# Patient Record
Sex: Female | Born: 1950 | Race: White | Hispanic: No | Smoking: Never smoker
Health system: Southern US, Community
[De-identification: ages and names within clinical notes are randomized; demographics above are authoritative.]

## PROBLEM LIST (undated history)

## (undated) ENCOUNTER — Emergency Department (HOSPITAL_BASED_OUTPATIENT_CLINIC_OR_DEPARTMENT_OTHER): Admission: EM | Payer: Medicare Other | Source: Home / Self Care

## (undated) DIAGNOSIS — Z86010 Personal history of colonic polyps: Secondary | ICD-10-CM

## (undated) DIAGNOSIS — M810 Age-related osteoporosis without current pathological fracture: Secondary | ICD-10-CM

## (undated) DIAGNOSIS — E559 Vitamin D deficiency, unspecified: Secondary | ICD-10-CM

## (undated) DIAGNOSIS — F329 Major depressive disorder, single episode, unspecified: Secondary | ICD-10-CM

## (undated) DIAGNOSIS — E785 Hyperlipidemia, unspecified: Secondary | ICD-10-CM

## (undated) HISTORY — DX: Vitamin D deficiency, unspecified: E55.9

## (undated) HISTORY — DX: Age-related osteoporosis without current pathological fracture: M81.0

## (undated) HISTORY — PX: COLONOSCOPY: SHX174

## (undated) HISTORY — PX: OTHER SURGICAL HISTORY: SHX169

## (undated) HISTORY — DX: Major depressive disorder, single episode, unspecified: F32.9

## (undated) HISTORY — DX: Personal history of colonic polyps: Z86.010

## (undated) HISTORY — DX: Hyperlipidemia, unspecified: E78.5

---

## 2001-05-08 ENCOUNTER — Encounter: Payer: Self-pay | Admitting: Emergency Medicine

## 2001-05-08 ENCOUNTER — Emergency Department (HOSPITAL_COMMUNITY): Admission: EM | Admit: 2001-05-08 | Discharge: 2001-05-08 | Payer: Self-pay | Admitting: Emergency Medicine

## 2001-05-18 ENCOUNTER — Encounter: Admission: RE | Admit: 2001-05-18 | Discharge: 2001-05-18 | Payer: Self-pay | Admitting: Family Medicine

## 2001-05-18 ENCOUNTER — Encounter: Payer: Self-pay | Admitting: Family Medicine

## 2001-06-07 ENCOUNTER — Other Ambulatory Visit: Admission: RE | Admit: 2001-06-07 | Discharge: 2001-06-07 | Payer: Self-pay | Admitting: Obstetrics & Gynecology

## 2001-06-16 ENCOUNTER — Encounter: Payer: Self-pay | Admitting: Obstetrics & Gynecology

## 2001-06-16 ENCOUNTER — Encounter: Admission: RE | Admit: 2001-06-16 | Discharge: 2001-06-16 | Payer: Self-pay | Admitting: Obstetrics & Gynecology

## 2002-03-27 ENCOUNTER — Emergency Department (HOSPITAL_COMMUNITY): Admission: EM | Admit: 2002-03-27 | Discharge: 2002-03-27 | Payer: Self-pay | Admitting: Emergency Medicine

## 2002-06-09 ENCOUNTER — Other Ambulatory Visit: Admission: RE | Admit: 2002-06-09 | Discharge: 2002-06-09 | Payer: Self-pay | Admitting: Obstetrics & Gynecology

## 2002-06-14 ENCOUNTER — Encounter: Payer: Self-pay | Admitting: Obstetrics & Gynecology

## 2002-06-14 ENCOUNTER — Encounter: Admission: RE | Admit: 2002-06-14 | Discharge: 2002-06-14 | Payer: Self-pay | Admitting: Obstetrics & Gynecology

## 2002-08-09 ENCOUNTER — Emergency Department (HOSPITAL_COMMUNITY): Admission: EM | Admit: 2002-08-09 | Discharge: 2002-08-09 | Payer: Self-pay | Admitting: Emergency Medicine

## 2002-08-09 ENCOUNTER — Encounter: Payer: Self-pay | Admitting: Emergency Medicine

## 2003-06-12 ENCOUNTER — Other Ambulatory Visit: Admission: RE | Admit: 2003-06-12 | Discharge: 2003-06-12 | Payer: Self-pay | Admitting: Obstetrics & Gynecology

## 2003-07-12 ENCOUNTER — Ambulatory Visit (HOSPITAL_COMMUNITY): Admission: RE | Admit: 2003-07-12 | Discharge: 2003-07-12 | Payer: Self-pay | Admitting: Obstetrics & Gynecology

## 2003-07-12 ENCOUNTER — Encounter: Payer: Self-pay | Admitting: Obstetrics & Gynecology

## 2004-02-15 ENCOUNTER — Emergency Department (HOSPITAL_COMMUNITY): Admission: EM | Admit: 2004-02-15 | Discharge: 2004-02-15 | Payer: Self-pay | Admitting: Family Medicine

## 2004-07-14 ENCOUNTER — Ambulatory Visit (HOSPITAL_COMMUNITY): Admission: RE | Admit: 2004-07-14 | Discharge: 2004-07-14 | Payer: Self-pay | Admitting: Obstetrics & Gynecology

## 2005-07-17 ENCOUNTER — Ambulatory Visit (HOSPITAL_COMMUNITY): Admission: RE | Admit: 2005-07-17 | Discharge: 2005-07-17 | Payer: Self-pay | Admitting: Obstetrics & Gynecology

## 2006-03-16 ENCOUNTER — Ambulatory Visit: Payer: Self-pay | Admitting: Internal Medicine

## 2006-06-10 ENCOUNTER — Emergency Department (HOSPITAL_COMMUNITY): Admission: EM | Admit: 2006-06-10 | Discharge: 2006-06-10 | Payer: Self-pay | Admitting: Emergency Medicine

## 2006-06-10 IMAGING — CR DG CHEST 2V
2 series · 2 of 2 positions shown · non-contrast
Comparison: None.

CLINICAL DATA: Right-sided chest pain.
 CHEST - 2 VIEW:

[view not recorded (1 of 2)]
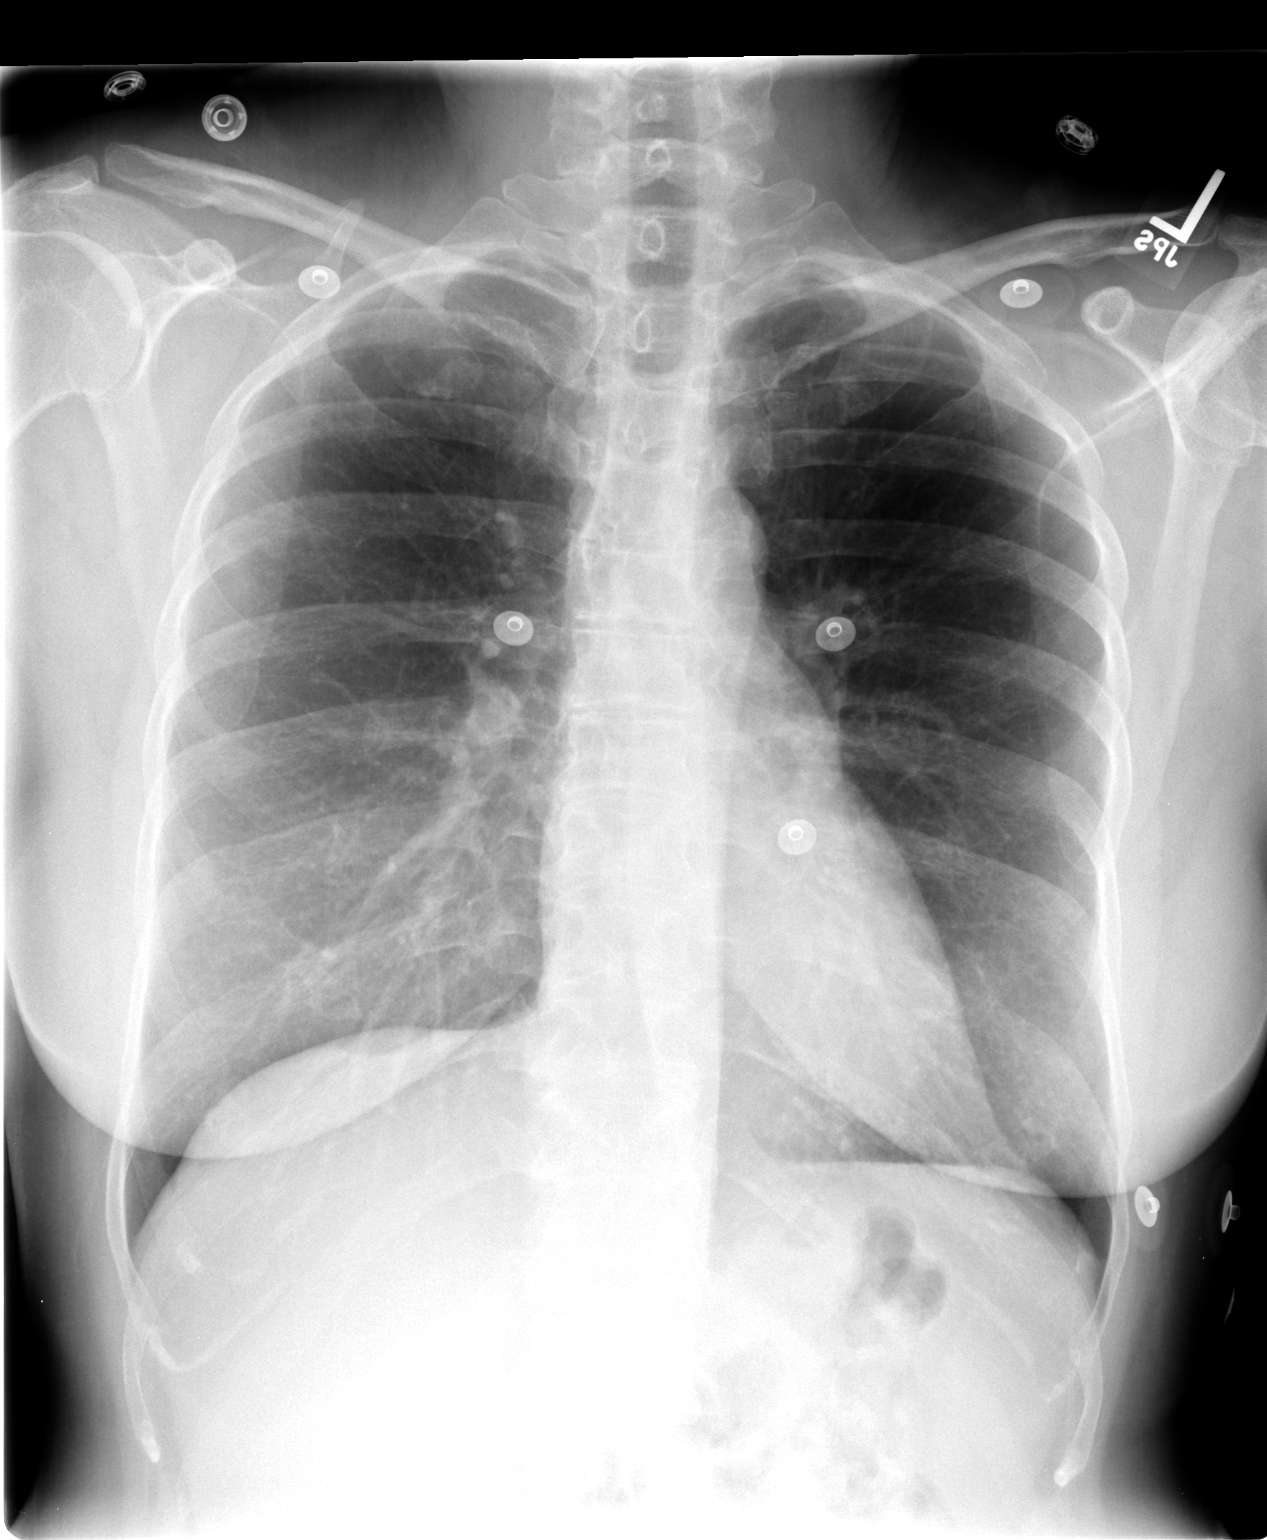

[view not recorded (2 of 2)]
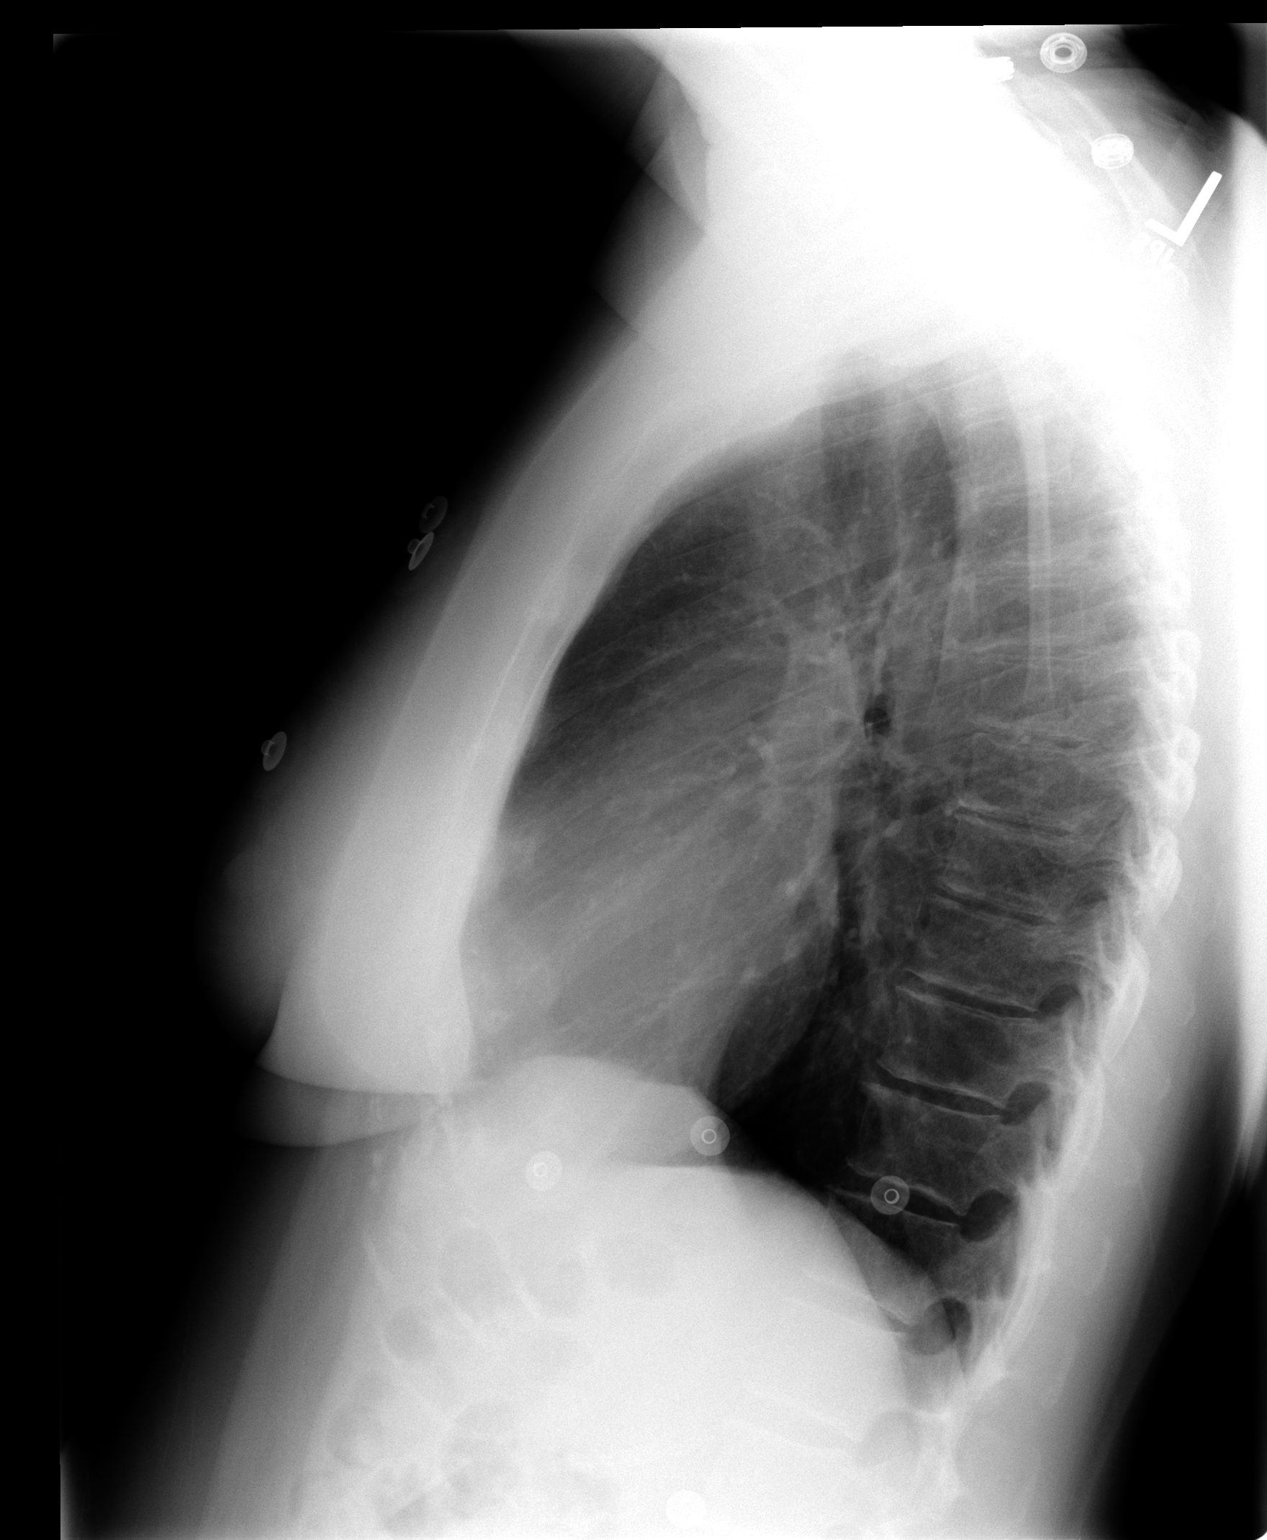

[2 of 2 positions shown; findings below may reference images not displayed]

FINDINGS: Heart size is normal. There are no effusions or edema. No airspace opacities are identified.
IMPRESSION: No active cardiopulmonary disease.

## 2006-07-19 ENCOUNTER — Ambulatory Visit (HOSPITAL_COMMUNITY): Admission: RE | Admit: 2006-07-19 | Discharge: 2006-07-19 | Payer: Self-pay | Admitting: Obstetrics & Gynecology

## 2007-03-14 ENCOUNTER — Ambulatory Visit: Payer: Self-pay | Admitting: Internal Medicine

## 2007-07-25 ENCOUNTER — Ambulatory Visit (HOSPITAL_COMMUNITY): Admission: RE | Admit: 2007-07-25 | Discharge: 2007-07-25 | Payer: Self-pay | Admitting: Obstetrics and Gynecology

## 2007-10-07 ENCOUNTER — Ambulatory Visit (HOSPITAL_COMMUNITY): Admission: RE | Admit: 2007-10-07 | Discharge: 2007-10-07 | Payer: Self-pay | Admitting: Obstetrics and Gynecology

## 2008-03-27 ENCOUNTER — Ambulatory Visit: Payer: Self-pay | Admitting: Internal Medicine

## 2008-03-27 DIAGNOSIS — M25579 Pain in unspecified ankle and joints of unspecified foot: Secondary | ICD-10-CM | POA: Insufficient documentation

## 2008-03-27 IMAGING — CR DG ANKLE COMPLETE 3+V*R*
3 series · 3 of 3 positions shown · non-contrast
Comparison: None

CLINICAL DATA: Ankle pain and swelling, fell [DATE]

RIGHT ANKLE - COMPLETE 3+ VIEW

[view not recorded (1 of 3)]
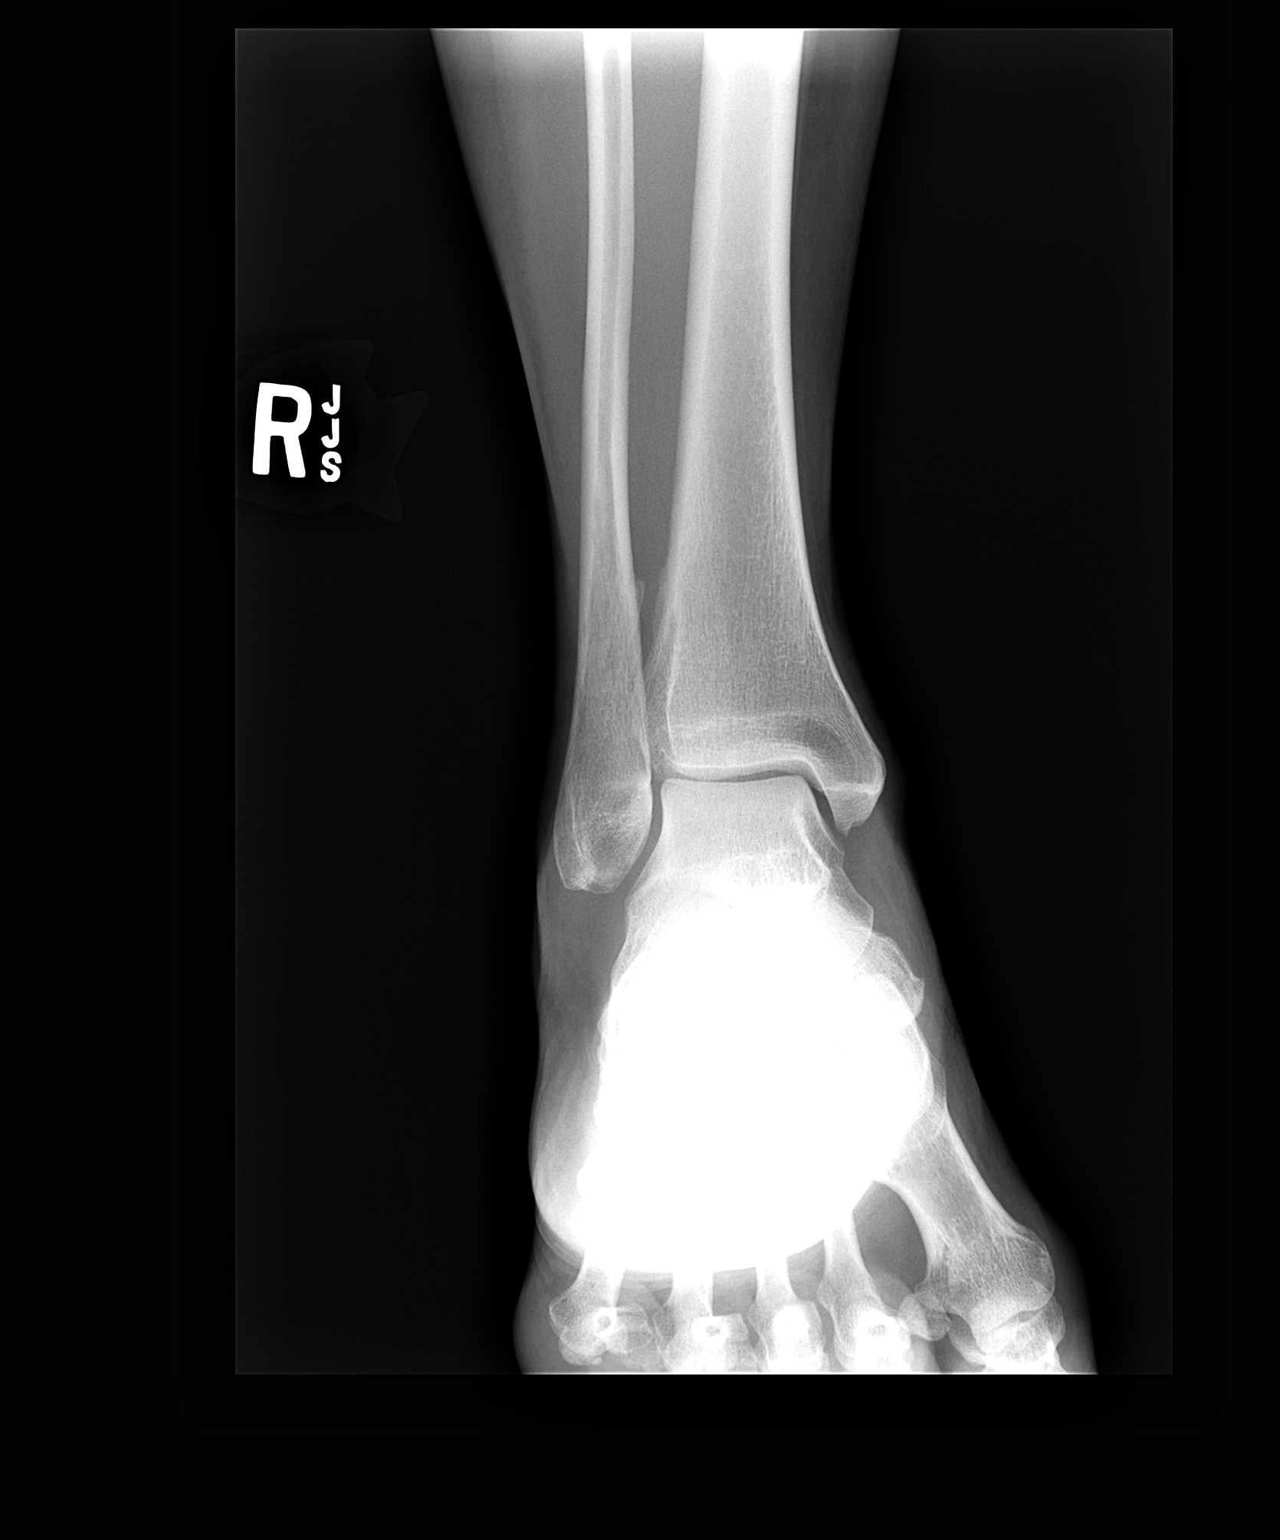

[view not recorded (2 of 3)]
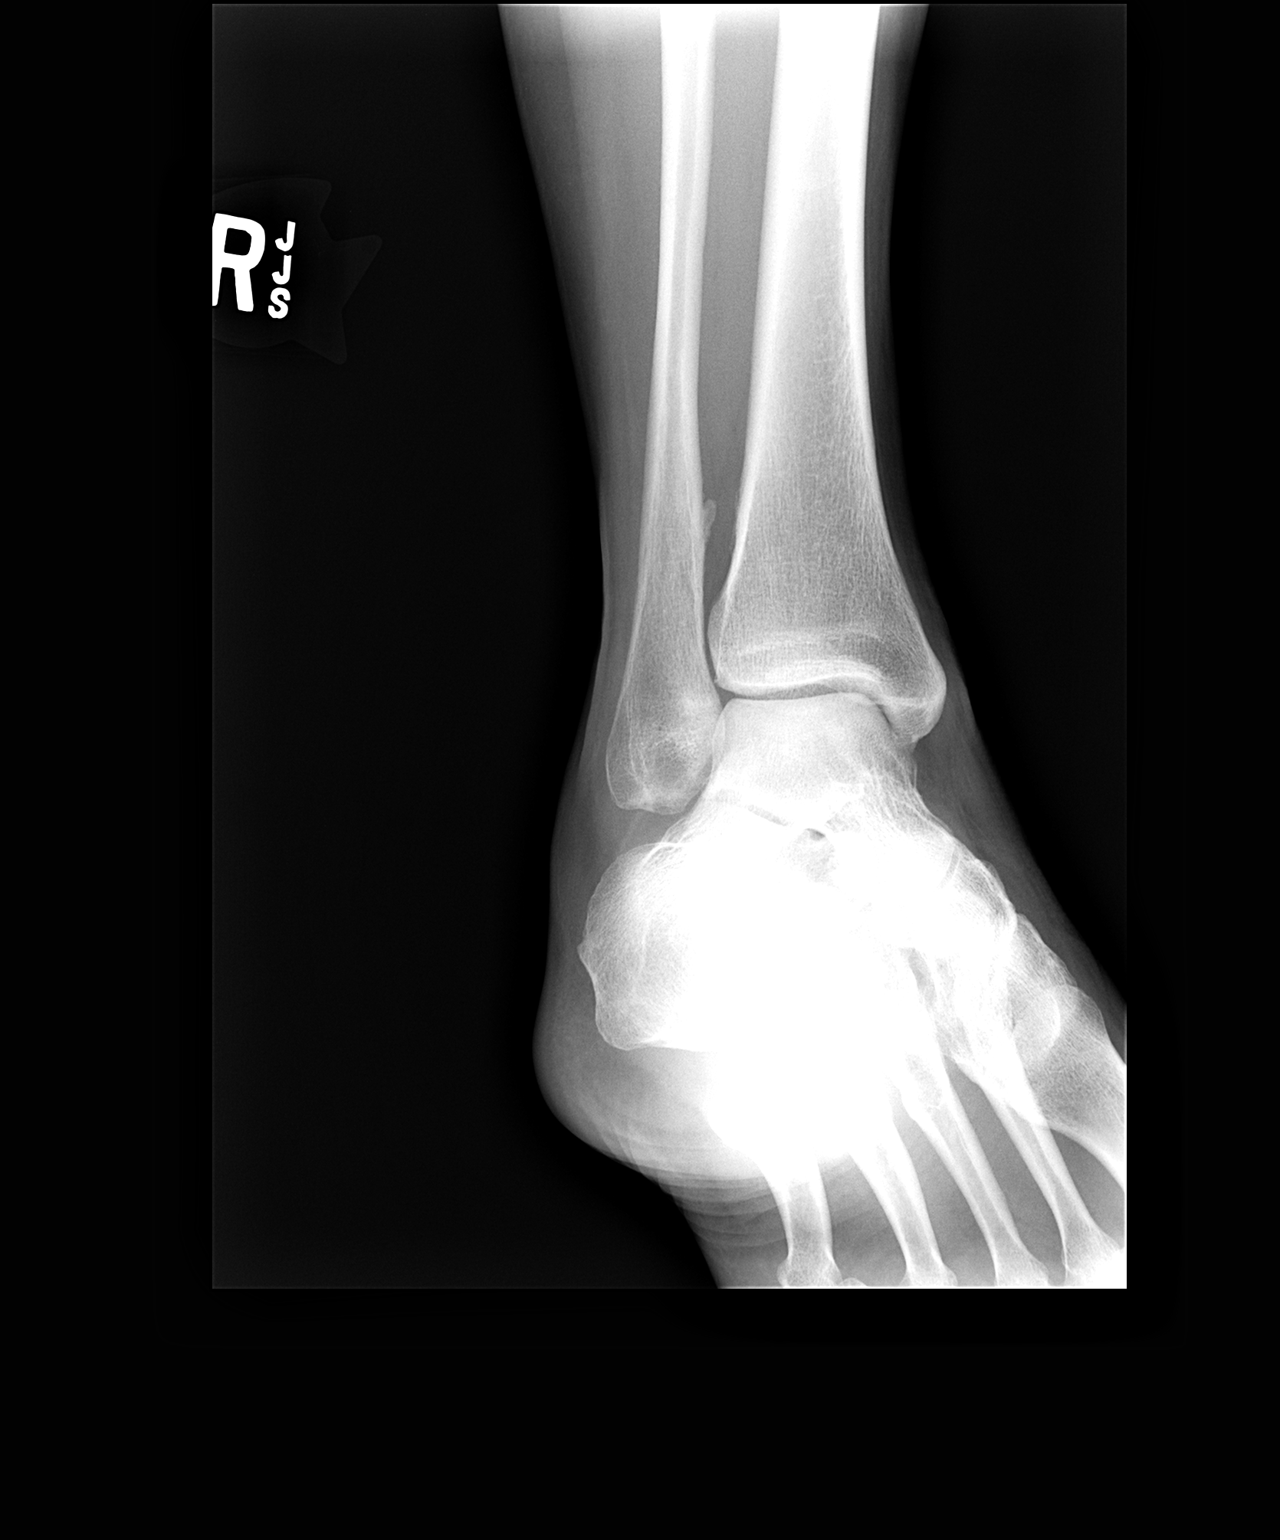

[view not recorded (3 of 3)]
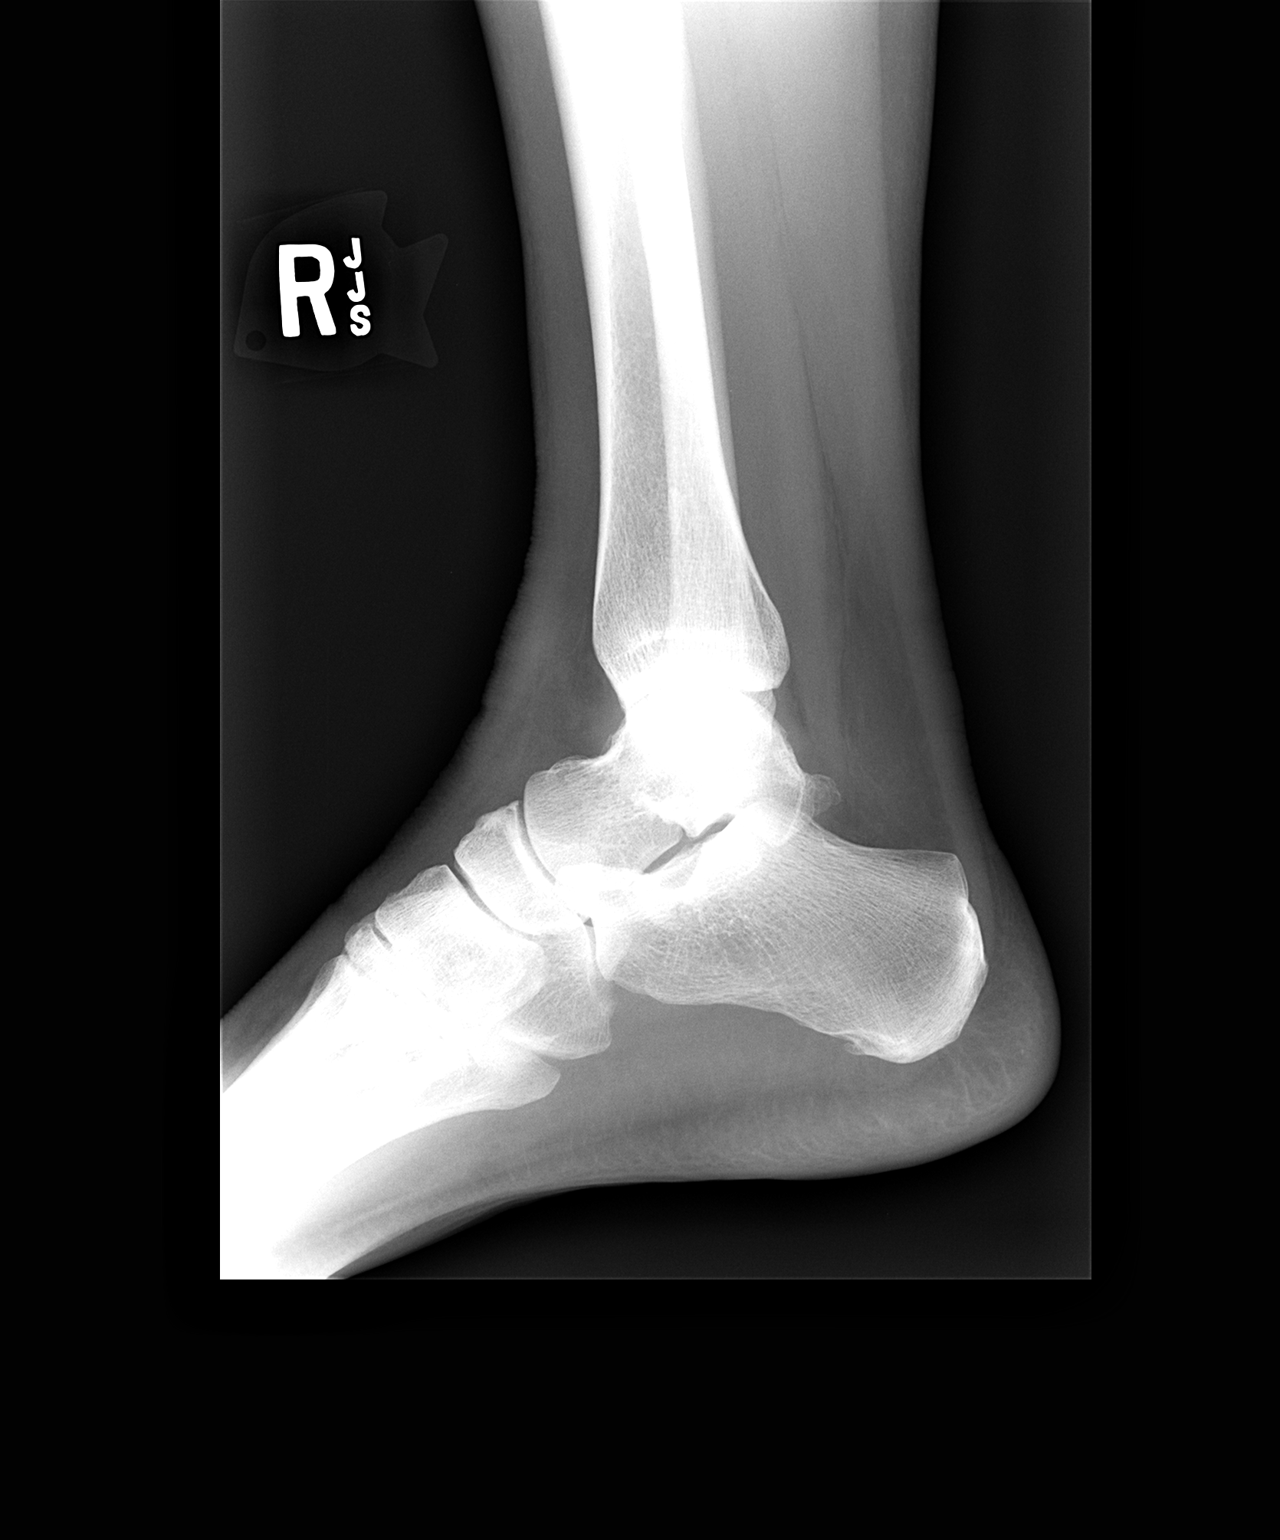

[3 of 3 positions shown; findings below may reference images not displayed]

FINDINGS: No acute fracture is seen.  The ankle joint appears
normal.  Alignment is normal.
IMPRESSION: No acute bony abnormality.

## 2008-04-30 ENCOUNTER — Telehealth (INDEPENDENT_AMBULATORY_CARE_PROVIDER_SITE_OTHER): Payer: Self-pay | Admitting: *Deleted

## 2008-05-02 ENCOUNTER — Encounter (INDEPENDENT_AMBULATORY_CARE_PROVIDER_SITE_OTHER): Payer: Self-pay | Admitting: *Deleted

## 2008-05-07 ENCOUNTER — Telehealth (INDEPENDENT_AMBULATORY_CARE_PROVIDER_SITE_OTHER): Payer: Self-pay | Admitting: *Deleted

## 2008-05-08 ENCOUNTER — Ambulatory Visit: Payer: Self-pay | Admitting: Internal Medicine

## 2008-05-21 ENCOUNTER — Encounter: Payer: Self-pay | Admitting: Internal Medicine

## 2008-05-23 ENCOUNTER — Emergency Department (HOSPITAL_COMMUNITY): Admission: EM | Admit: 2008-05-23 | Discharge: 2008-05-23 | Payer: Self-pay | Admitting: Emergency Medicine

## 2008-08-10 ENCOUNTER — Ambulatory Visit (HOSPITAL_COMMUNITY): Admission: RE | Admit: 2008-08-10 | Discharge: 2008-08-10 | Payer: Self-pay | Admitting: Obstetrics and Gynecology

## 2009-04-25 ENCOUNTER — Ambulatory Visit: Payer: Self-pay | Admitting: Internal Medicine

## 2009-04-25 DIAGNOSIS — L259 Unspecified contact dermatitis, unspecified cause: Secondary | ICD-10-CM | POA: Insufficient documentation

## 2009-08-16 ENCOUNTER — Ambulatory Visit (HOSPITAL_COMMUNITY): Admission: RE | Admit: 2009-08-16 | Discharge: 2009-08-16 | Payer: Self-pay | Admitting: Obstetrics and Gynecology

## 2009-12-09 ENCOUNTER — Encounter: Payer: Self-pay | Admitting: Internal Medicine

## 2009-12-09 ENCOUNTER — Ambulatory Visit: Payer: Self-pay | Admitting: Endocrinology

## 2009-12-09 DIAGNOSIS — R109 Unspecified abdominal pain: Secondary | ICD-10-CM | POA: Insufficient documentation

## 2009-12-09 LAB — CONVERTED CEMR LAB
ALT: 17 units/L (ref 0–35)
AST: 21 units/L (ref 0–37)
Albumin: 4.1 g/dL (ref 3.5–5.2)
Alkaline Phosphatase: 84 units/L (ref 39–117)
Amylase: 74 units/L (ref 27–131)
Basophils Absolute: 0 10*3/uL (ref 0.0–0.1)
Basophils Relative: 0.6 % (ref 0.0–3.0)
Bilirubin, Direct: 0.1 mg/dL (ref 0.0–0.3)
Creatinine, Ser: 0.6 mg/dL (ref 0.4–1.2)
Eosinophils Absolute: 0.2 10*3/uL (ref 0.0–0.7)
Eosinophils Relative: 3.3 % (ref 0.0–5.0)
HCT: 37.4 % (ref 36.0–46.0)
Hemoglobin: 12.8 g/dL (ref 12.0–15.0)
Lymphocytes Relative: 30.6 % (ref 12.0–46.0)
Lymphs Abs: 2.2 10*3/uL (ref 0.7–4.0)
MCHC: 34.2 g/dL (ref 30.0–36.0)
MCV: 94 fL (ref 78.0–100.0)
Monocytes Absolute: 0.4 10*3/uL (ref 0.1–1.0)
Monocytes Relative: 5.6 % (ref 3.0–12.0)
Neutro Abs: 4.3 10*3/uL (ref 1.4–7.7)
Neutrophils Relative %: 59.9 % (ref 43.0–77.0)
Platelets: 233 10*3/uL (ref 150.0–400.0)
RBC: 3.98 M/uL (ref 3.87–5.11)
RDW: 13 % (ref 11.5–14.6)
Total Bilirubin: 0.5 mg/dL (ref 0.3–1.2)
Total Protein: 7 g/dL (ref 6.0–8.3)
Uric Acid, Serum: 5.5 mg/dL (ref 2.4–7.0)
WBC: 7.1 10*3/uL (ref 4.5–10.5)

## 2009-12-10 ENCOUNTER — Ambulatory Visit: Payer: Self-pay | Admitting: Cardiology

## 2009-12-10 ENCOUNTER — Encounter (INDEPENDENT_AMBULATORY_CARE_PROVIDER_SITE_OTHER): Payer: Self-pay | Admitting: *Deleted

## 2009-12-10 DIAGNOSIS — R109 Unspecified abdominal pain: Secondary | ICD-10-CM | POA: Insufficient documentation

## 2009-12-10 IMAGING — CT CT ABDOMEN W/ CM
3 of 5 series · 15 of 46 positions shown, 20 images · IV contrast (Omnipaque 300)
Comparison: None.

CLINICAL DATA: Abdominal pain.  Periumbilical hernia.

CT ABDOMEN WITH CONTRAST
TECHNIQUE: Multidetector CT imaging of the abdomen was performed
using the standard protocol following bolus administration of
intravenous contrast.
Contrast: 100 ml [DL] and oral contrast

[Series 2: abd/ pel · axial · 0.75mm/px · z∈[-244,-34]mm · 11 of 50 slices shown, 16 images]
[im 4/50  soft-tissue]
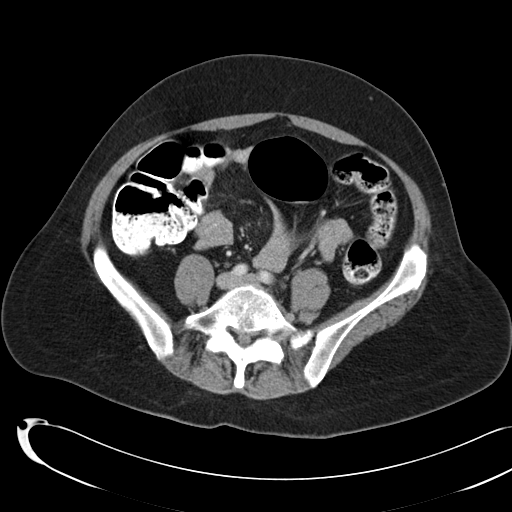
[im 4/50  bone]
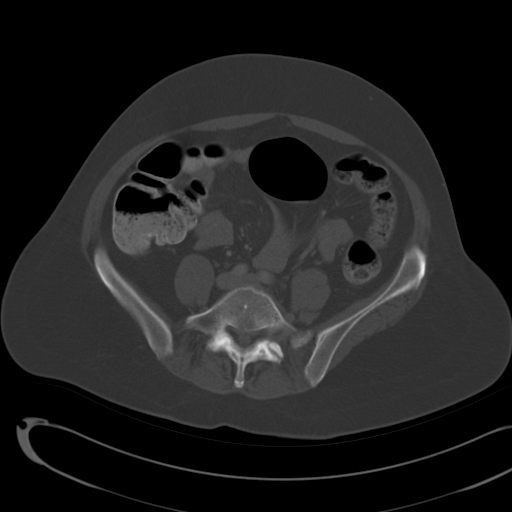
[im 8/50  soft-tissue]
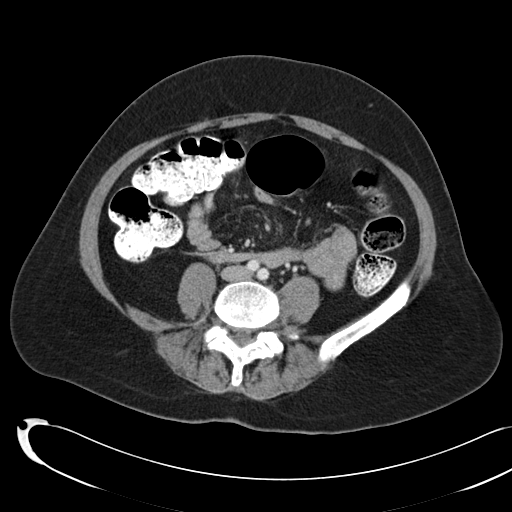
[im 16/50  soft-tissue]
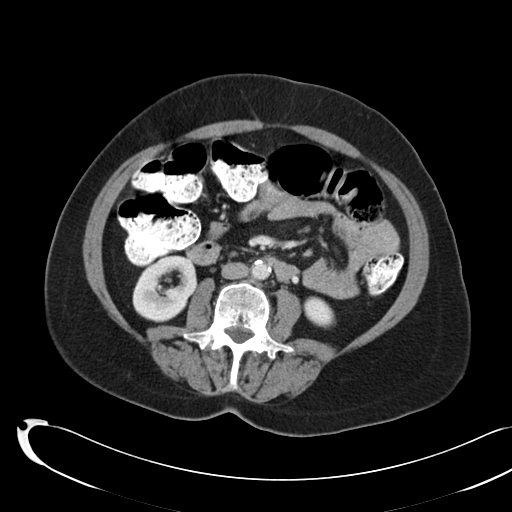
[im 19/50  soft-tissue]
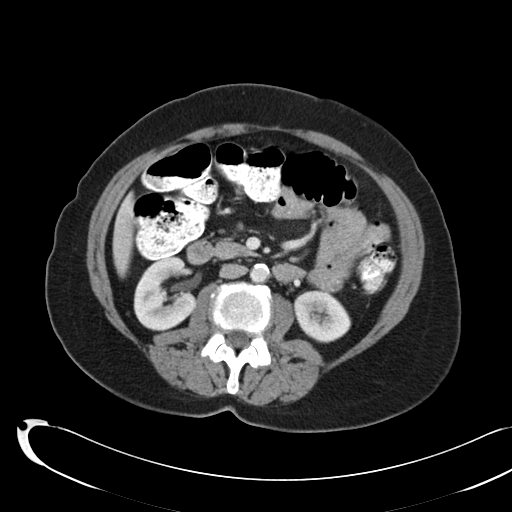
[im 23/50  soft-tissue]
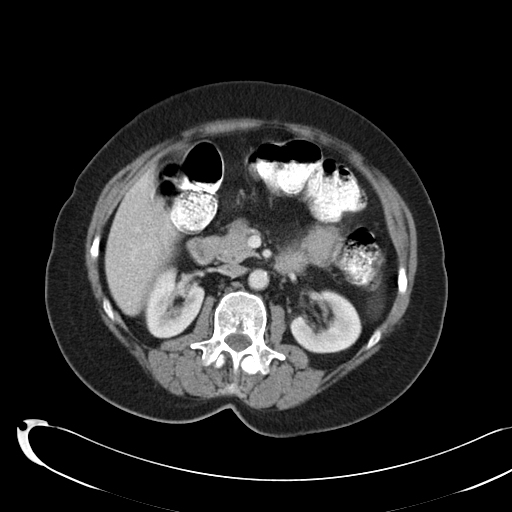
[im 27/50  soft-tissue]
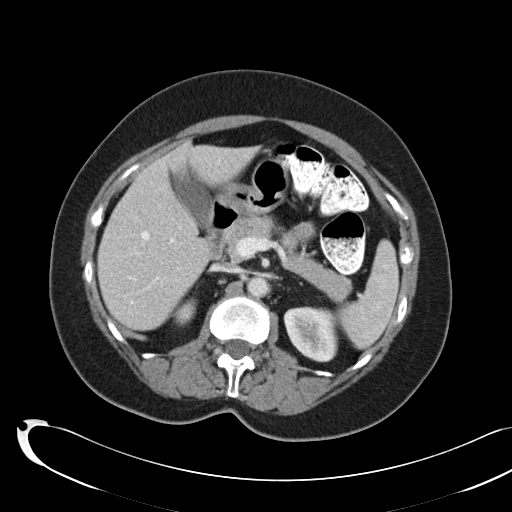
[im 31/50  soft-tissue]
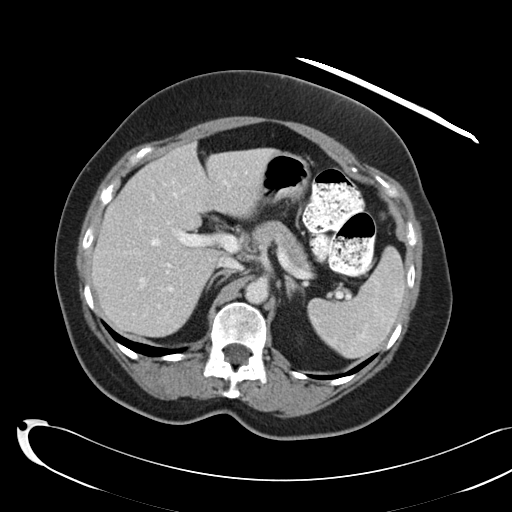
[im 34/50  lung]
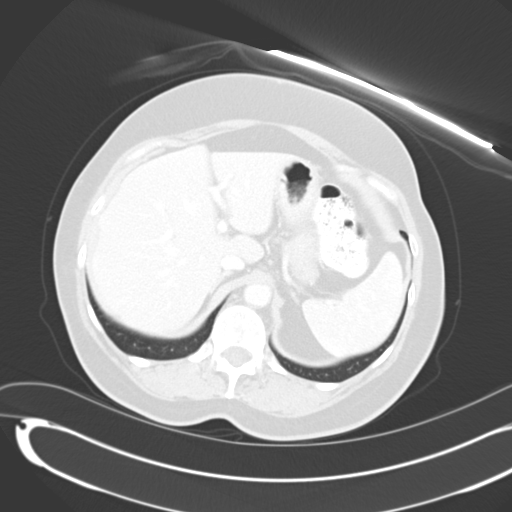
[im 38/50  soft-tissue]
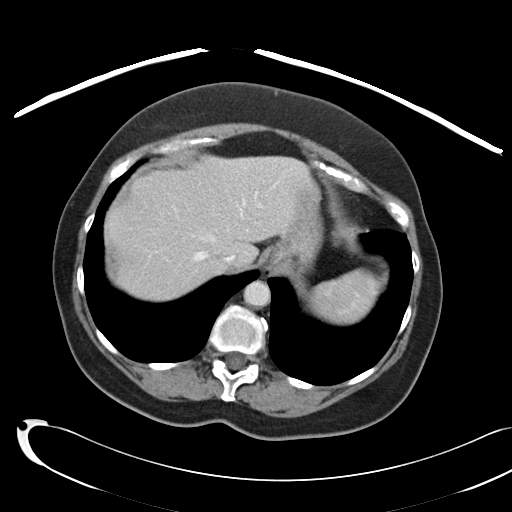
[im 38/50  lung]
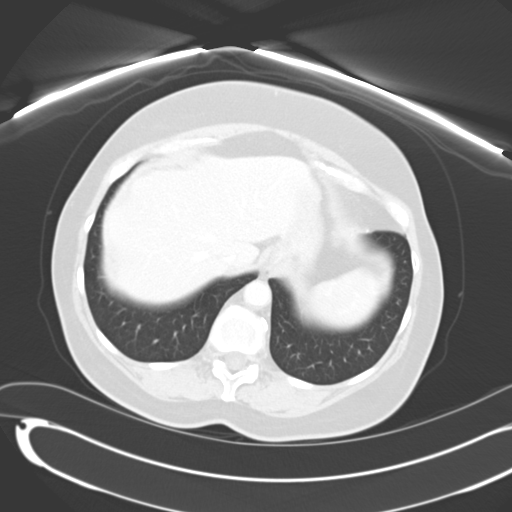
[im 42/50  soft-tissue]
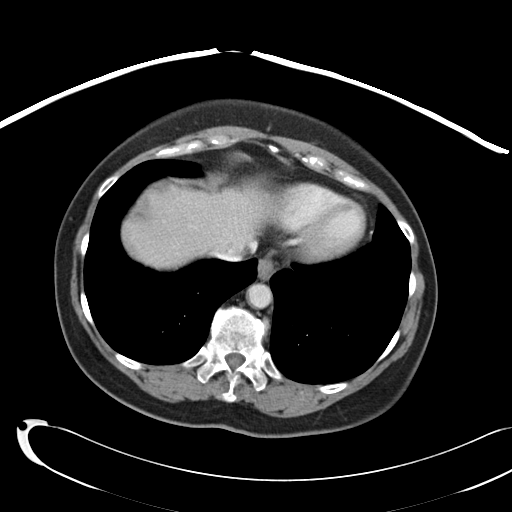
[im 42/50  lung]
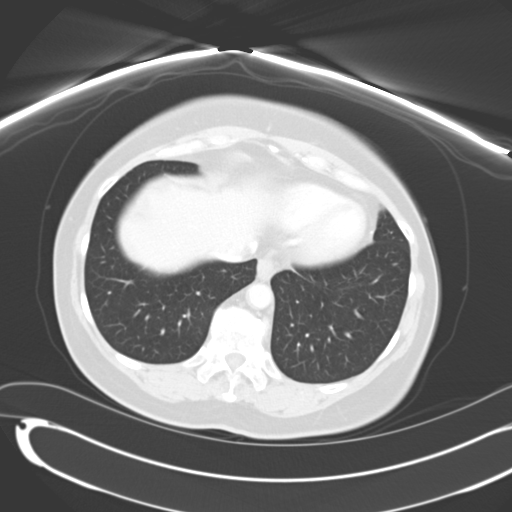
[im 42/50  bone]
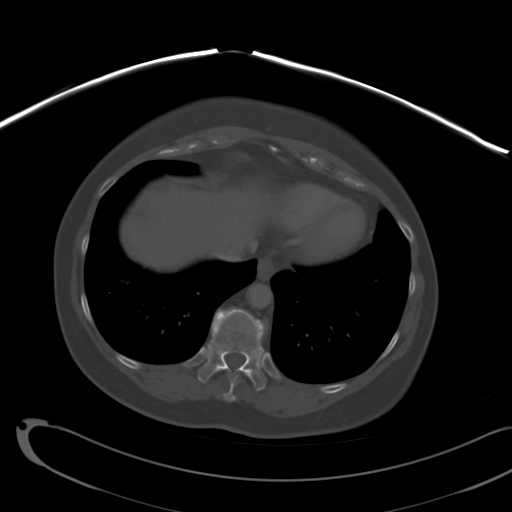
[im 46/50  soft-tissue]
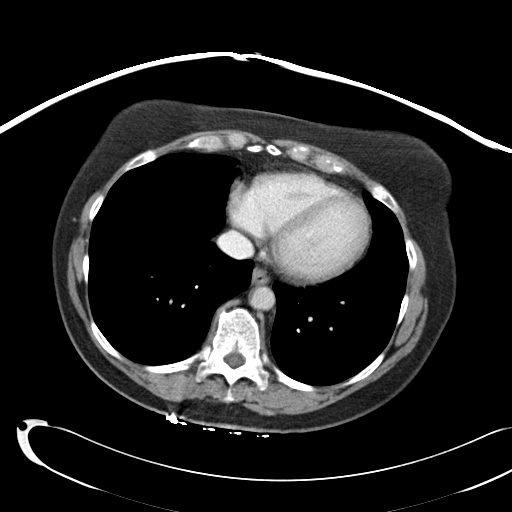
[im 46/50  lung]
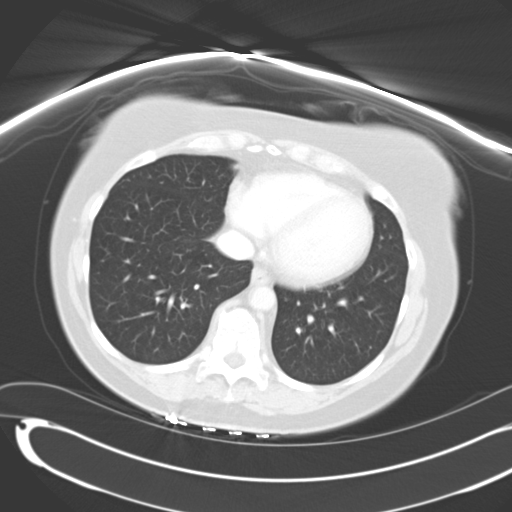

[Series 602: <mpr range> · coronal · 0.75mm/px · 3 of 137 slices shown]
[im 46/137  soft-tissue]
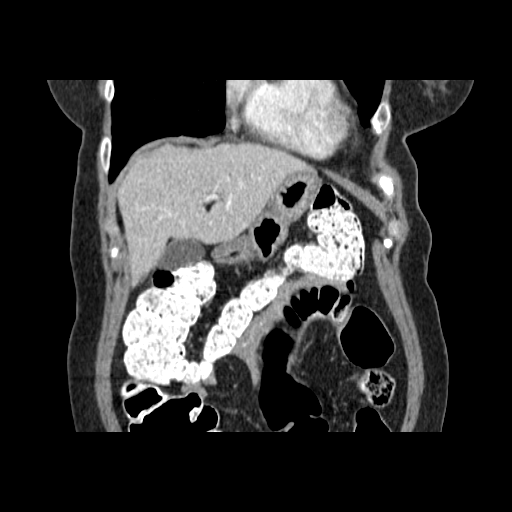
[im 61/137  soft-tissue]
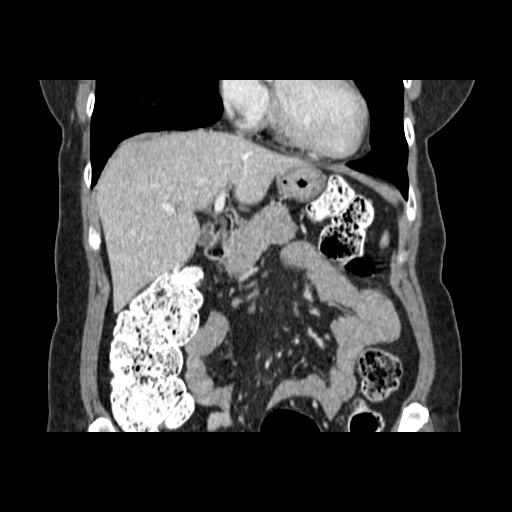
[im 76/137  soft-tissue]
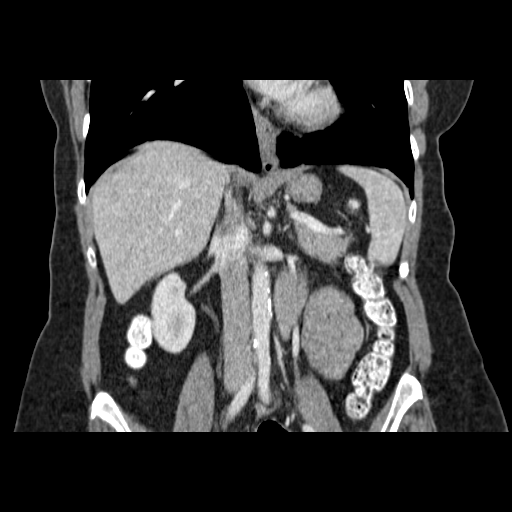

[Series 603: <mpr range(1)> · sagittal · 0.75mm/px · 1 of 140 slices shown]
[im 47/140  soft-tissue]
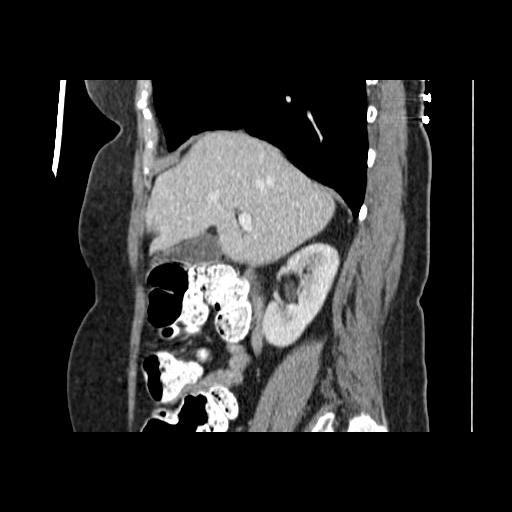

[15 of 46 positions shown; findings below may reference images not displayed]

FINDINGS: No evidence of anterior abdominal wall hernia or mass.
The abdominal parenchymal organs are normal in appearance.  No soft
tissue masses or adenopathy identified within the abdomen.  No
inflammatory process or abnormal fluid collections are identified.
No evidence of dilated bowel loops.
IMPRESSION: Negative abdomen CT.  No evidence of abdominal wall hernia or mass.

## 2009-12-19 ENCOUNTER — Telehealth: Payer: Self-pay | Admitting: Internal Medicine

## 2010-07-09 ENCOUNTER — Ambulatory Visit: Payer: Self-pay | Admitting: Internal Medicine

## 2010-07-09 DIAGNOSIS — R300 Dysuria: Secondary | ICD-10-CM | POA: Insufficient documentation

## 2010-07-09 DIAGNOSIS — E559 Vitamin D deficiency, unspecified: Secondary | ICD-10-CM

## 2010-07-09 HISTORY — DX: Vitamin D deficiency, unspecified: E55.9

## 2010-07-09 LAB — CONVERTED CEMR LAB
Bilirubin Urine: NEGATIVE
Glucose, Urine, Semiquant: NEGATIVE
Ketones, urine, test strip: NEGATIVE
Nitrite: NEGATIVE
Protein, U semiquant: NEGATIVE
Specific Gravity, Urine: 1.025
Urobilinogen, UA: 0.2
pH: 5

## 2010-08-15 ENCOUNTER — Ambulatory Visit: Payer: Self-pay | Admitting: Internal Medicine

## 2010-08-15 LAB — CONVERTED CEMR LAB
ALT: 12 units/L (ref 0–35)
AST: 16 units/L (ref 0–37)
Albumin: 4 g/dL (ref 3.5–5.2)
Alkaline Phosphatase: 90 units/L (ref 39–117)
BUN: 13 mg/dL (ref 6–23)
Basophils Absolute: 0 10*3/uL (ref 0.0–0.1)
Basophils Relative: 0.6 % (ref 0.0–3.0)
Bilirubin Urine: NEGATIVE
Bilirubin, Direct: 0.1 mg/dL (ref 0.0–0.3)
CO2: 29 meq/L (ref 19–32)
Calcium: 9.2 mg/dL (ref 8.4–10.5)
Chloride: 107 meq/L (ref 96–112)
Cholesterol: 268 mg/dL — ABNORMAL HIGH (ref 0–200)
Creatinine, Ser: 0.7 mg/dL (ref 0.4–1.2)
Direct LDL: 193.1 mg/dL
Eosinophils Absolute: 0.2 10*3/uL (ref 0.0–0.7)
Eosinophils Relative: 3.3 % (ref 0.0–5.0)
GFR calc non Af Amer: 97.91 mL/min (ref >60)
Glucose, Bld: 84 mg/dL (ref 70–99)
HCT: 40.1 % (ref 36.0–46.0)
HDL: 48.8 mg/dL (ref >39.00)
Hemoglobin: 13.6 g/dL (ref 12.0–15.0)
Ketones, ur: NEGATIVE mg/dL
Lymphocytes Relative: 35.1 % (ref 12.0–46.0)
Lymphs Abs: 2.2 10*3/uL (ref 0.7–4.0)
MCHC: 33.9 g/dL (ref 30.0–36.0)
MCV: 93.7 fL (ref 78.0–100.0)
Monocytes Absolute: 0.5 10*3/uL (ref 0.1–1.0)
Monocytes Relative: 7.8 % (ref 3.0–12.0)
Neutro Abs: 3.3 10*3/uL (ref 1.4–7.7)
Neutrophils Relative %: 53.2 % (ref 43.0–77.0)
Nitrite: POSITIVE
Platelets: 261 10*3/uL (ref 150.0–400.0)
Potassium: 4.4 meq/L (ref 3.5–5.1)
RBC: 4.28 M/uL (ref 3.87–5.11)
RDW: 13.7 % (ref 11.5–14.6)
Sodium: 143 meq/L (ref 135–145)
Specific Gravity, Urine: 1.025 (ref 1.000–1.030)
TSH: 3.26 microintl units/mL (ref 0.35–5.50)
Total Bilirubin: 0.6 mg/dL (ref 0.3–1.2)
Total CHOL/HDL Ratio: 5
Total Protein, Urine: NEGATIVE mg/dL
Total Protein: 6.9 g/dL (ref 6.0–8.3)
Triglycerides: 133 mg/dL (ref 0.0–149.0)
Urine Glucose: NEGATIVE mg/dL
Urobilinogen, UA: 0.2 (ref 0.0–1.0)
VLDL: 26.6 mg/dL (ref 0.0–40.0)
WBC: 6.2 10*3/uL (ref 4.5–10.5)
pH: 5 (ref 5.0–8.0)

## 2010-08-22 ENCOUNTER — Ambulatory Visit (HOSPITAL_COMMUNITY)
Admission: RE | Admit: 2010-08-22 | Discharge: 2010-08-22 | Payer: Self-pay | Source: Home / Self Care | Admitting: Obstetrics and Gynecology

## 2010-10-19 ENCOUNTER — Encounter: Payer: Self-pay | Admitting: Obstetrics and Gynecology

## 2010-10-28 NOTE — Letter (Signed)
Summary: [facility] Consult Scheduled Letter  [facility]  [address] [place], [place] [address]   Phone: [phone]  Fax: [phone]      05/02/2008 MRN: [id number]  Amber Hicks [address] [place], [place]  [address]    Dear [name],      We have scheduled an appointment for you.  At the recommendation of Dr.John, we have scheduled you a consult with Dr Penni Bombard on 05/11/08 at 2:45pm.  Their phone number is [phone].  If this appointment day and time is not convenient for you, please feel free to call the office of the doctor you are being referred to at the number listed above and reschedule the appointment.    [facility] [address] [place], [place] [address]   *Please arrive 15 minutes prior to appointment.*   Thank you,  Patient Care Coordinator [facility]

## 2010-10-28 NOTE — Progress Notes (Signed)
Summary: ankle pain, ?prescription for pain meds?  Phone Note Call from Patient Call back at [phone]   Call For: John Summary of Call: Pt calling to get pain med refill. Pt states she hurt ankle 6/30 and still has pain w/swelling. [facility]. Does pt need office visit?  Initial call taken by: Verdell Face,  April 30, 2008 10:16 AM  Follow-up for Phone Call        we will need to refer to ortho; ok for a few more percocet - done hardcopy; should f/u overall with dr Laury Axon Follow-up by: Corwin Levins MD,  April 30, 2008 10:48 AM  Additional Follow-up for Phone Call Additional follow up Details #1::        April 30, 2008 10:52 AM called pt spoke with pt friend stated pt  could speak on the phom=ne ,informed pt friend what dr Tera Helper advised on about orto doctor and dr Rana Snare Additional Follow-up by: Shelbie Proctor,  April 30, 2008 10:53 AM      Prescriptions: PERCOCET 5-325 MG  TABS (OXYCODONE-ACETAMINOPHEN) 1 by mouth q 6 hrs as needed pain  #40 x 0   Entered by:   Corwin Levins MD   Authorized by:   Shelbie Proctor   Signed by:   Corwin Levins MD on 04/30/2008   Method used:   Print then Give to Patient   RxID:   [id number]

## 2010-10-28 NOTE — Miscellaneous (Signed)
Summary: [employer] [facility]/ [facility]  [employer] [facility]/ [facility]   Imported By: Lester [place] 12/12/2009 08:17:50  _____________________________________________________________________  External Attachment:    Type:   Image     Comment:   External Document

## 2010-10-28 NOTE — Assessment & Plan Note (Signed)
Summary: pain beneath belly button/hernia?/john-lb   Vital Signs:  Patient profile:   60 year old female Height:      67 inches (170.18 cm) Weight:      159 pounds (72.27 kg) O2 Sat:      98 % on Room air Temp:     98.5 degrees F (36.94 degrees C) oral Pulse rate:   70 / minute BP sitting:   110 / 90  (left arm) Cuff size:   regular  Vitals Entered By: Josph Macho RMA (December 09, 2009 2:12 PM)  O2 Flow:  Room air CC: Pain beneath belly button X5days/ CF Is Patient Diabetic? No   CC:  Pain beneath belly button X5days/ CF.  History of Present Illness: pt states 5 days of moderate pain at the periumbilical area.  no associated fever, but she has constipation.  she thinks she has a hernia.  Current Medications (verified): 1)  Percocet 5-325 Mg  Tabs (Oxycodone-Acetaminophen) .Marland Kitchen.. 1 By Mouth Q 6 Hrs As Needed Pain 2)  Meloxicam 15 Mg Tabs (Meloxicam) .... One By Mouth Daily Pc 3)  Vitamin D3 1000 Unit  Tabs (Cholecalciferol) .Marland Kitchen.. 1 By Mouth Daily 4)  Prednisone 10 Mg Tabs (Prednisone) .... 4po Qd For 3days, Then 3po Qd For 3days, Then 2po Qd For 3days, Then 1po Qd For 3 Days, Then Stop 5)  Hydroxyzine Hcl 25 Mg Tabs (Hydroxyzine Hcl) .Marland Kitchen.. 1 By Mouth Q 6 Hrs As Needed For Itching  Allergies (verified): No Known Drug Allergies  Past History:  Past Medical History: Last updated: 03/27/2008 Unremarkable per pt  Review of Systems       denies n/v/diarrhea  Physical Exam  General:  no distress  Abdomen:  abdomen is soft, nontender, except for slight periumbilical tenderness.  no hepatosplenomegaly.   not distended.  no hernia  Additional Exam:  White Cell Count          7.1 K/uL                    4.5-10.5   Hemoglobin                12.8 g/dL                   16.1-09.6   Hematocrit                37.4 %                      36.0-46.0    Platelet Count            233.0 K/uL                  150.0-400.0     Total Bilirubin           0.5 mg/dL                    0.4-5.4   Direct Bilirubin          0.1 mg/dL                   0.9-8.1   Alkaline Phosphatase      84 U/L                      39-117   AST  21 U/L                      0-37   ALT                       17 U/L                      0-35   Total Protein             7.0 g/dL                    1.6-1.0   Albumin                   4.1 g/dL                    9.6-0.4    Amylase                   74 U/L         Impression & Recommendations:  Problem # 1:  ABDOMINAL PAIN, UNSPECIFIED SITE (ICD-789.00) ? hernia  Other Orders: TLB-Creatinine, Blood (82565-CREA) TLB-Uric Acid, Blood (84550-URIC) TLB-CBC Platelet - w/Differential (85025-CBCD) TLB-Hepatic/Liver Function Pnl (80076-HEPATIC) TLB-Amylase (82150-AMYL) Radiology Referral (Radiology) Est. Patient Level IV (54098)  Patient Instructions: 1)  check ct of the abdomen today. 2)  try "miralax" 17 grams three times a day 3)  call dr plotnikov in a few days if you are not better.

## 2010-10-28 NOTE — Progress Notes (Signed)
Summary: results  Phone Note Call from Patient Call back at Home Phone [phone]   Caller: Patient [phone] Summary of Call: pt called requesting results of CT scan done 03/15 Initial call taken by: Margaret Pyle, CMA,  December 19, 2009 2:05 PM  Follow-up for Phone Call        normal result is on phone-tree Follow-up by: Minus Breeding MD,  December 19, 2009 2:30 PM  Additional Follow-up for Phone Call Additional follow up Details #1::        pt informed Additional Follow-up by: Margaret Pyle, CMA,  December 19, 2009 3:17 PM

## 2010-10-28 NOTE — Assessment & Plan Note (Signed)
Summary: ANKLE PROBLEM/$50/PN   Vital Signs:  Patient Profile:   60 Years Old Female Weight:      161 pounds Temp:     96.0 degrees F oral Pulse rate:   64 / minute BP sitting:   128 / 70  (right arm) Cuff size:   regular  Vitals Entered By: Jerilynn Mages MA (March 27, 2008 8:27 AM)                 Chief Complaint:  ankle problem.  History of Present Illness: here after 5 days twisted ankle "in a hole,"  still marked swelling and pain to outside of right ankle even after 5 days    Updated Prior Medication List: PERCOCET 5-325 MG  TABS (OXYCODONE-ACETAMINOPHEN) 1 by mouth q 6 hrs as needed pain  Current Allergies (reviewed today): No known allergies   Past Medical History:    Reviewed history and no changes required:       Unremarkable per pt  Past Surgical History:    Reviewed history and no changes required:       s/p c-section    Family History:    Reviewed history and no changes required:       mother with heart disease       3 brothers - perfect health per pt  Social History:    Reviewed history and no changes required:       Married       1 child       homemaker       Never Smoked       Alcohol use-no   Risk Factors:  Tobacco use:  never Alcohol use:  no   Review of Systems       all otherwise negative    Physical Exam  General:     Well-developed,well-nourished,in no acute distress; alert,appropriate and cooperative throughout examination Head:     Normocephalic and atraumatic without obvious abnormalities. No apparent alopecia or balding. Eyes:     No corneal or conjunctival inflammation noted. EOMI. Perrla. Ears:     External ear exam shows no significant lesions or deformities.  Otoscopic examination reveals clear canals, tympanic membranes are intact bilaterally without bulging, retraction, inflammation or discharge. Hearing is grossly normal bilaterally. Nose:     External nasal examination shows no deformity or inflammation.  Nasal mucosa are pink and moist without lesions or exudates. Mouth:     Oral mucosa and oropharynx without lesions or exudates.  Teeth in good repair. Neck:     No deformities, masses, or tenderness noted. Lungs:     Normal respiratory effort, chest expands symmetrically. Lungs are clear to auscultation, no crackles or wheezes. Heart:     Normal rate and regular rhythm. S1 and S2 normal without gallop, murmur, click, rub or other extra sounds. Msk:     right ankle with severe swelling but no bruising it seems to right malleolar and post achilles area o/w neurovasc intact Extremities:     no edema, no ulcers except for the above    Impression & Recommendations:  Problem # 1:  ANKLE PAIN, RIGHT (ICD-719.47) c/w lateral severe strain, with marked pain even 5 days out - will check films, tx with percocet as needed, soft shoe cast; to ortho if proven to have fx involved Orders: T-Ankle Comp Right (73610TC)   Complete Medication List: 1)  Percocet 5-325 Mg Tabs (Oxycodone-acetaminophen) .Marland Kitchen.. 1 by mouth q 6 hrs as needed pain   Patient  Instructions: 1)  you are given the soft shoe cast today 2)  take all new medications as prescribed 3)  continue all medications that you may have been taking previously 4)  you will have the right ankle xray today 5)  please see Dr Laury Axon for followup if pain does not improve in 1 to 2 wks 6)  you will be called for referral to the orthopedic MD if there is fracture on the xrays   Prescriptions: PERCOCET 5-325 MG  TABS (OXYCODONE-ACETAMINOPHEN) 1 by mouth q 6 hrs as needed pain  #60 x 0   Entered and Authorized by:   Corwin Levins MD   Signed by:   Corwin Levins MD on 03/27/2008   Method used:   Print then Give to Patient   RxID:   [email or web address]  [email or web address]  Appended Document: ANKLE PROBLEM/$50/PN  Laboratory Results

## 2010-10-28 NOTE — Consult Note (Signed)
Summary: R sprain ankle/[facility]  R sprain ankle/[facility]   Imported By: Lester [place] 05/28/2008 08:50:58  _____________________________________________________________________  External Attachment:    Type:   Image     Comment:   External Document

## 2010-10-28 NOTE — Progress Notes (Signed)
  Phone Note Call from Patient   Caller: Patient Call For: On call Summary of Call: Patient states that she "nearly broke" her ankle on 03/27/08 and has been in pain since.  Now, she has re-injured the same ankle and it is extremely painful and swollen.  She calls wanting refills on pain meds.  Explained to patient that she needs to have the ankle evaluated.  She agrees to go to urgent care.   Initial call taken by: Paulo Fruit MD,  April 29, 2008

## 2010-10-28 NOTE — Assessment & Plan Note (Signed)
Summary: FACE NECK RASH $50  STC   Vital Signs:  Patient profile:   60 year old female Height:      67 inches Weight:      150.50 pounds BMI:     23.66 O2 Sat:      97 % Temp:     97.7 degrees F oral Pulse rate:   68 / minute BP sitting:   120 / 66  (left arm) Cuff size:   regular  Vitals Entered By: Windell Norfolk (April 25, 2009 4:22 PM) CC: rash on face and neck x 2-3 days   CC:  rash on face and neck x 2-3 days.  History of Present Illness: here with severe itchy rash that seemed to start to the neck and upper chest area 2 days ago, now spreading to bilat face/cheek areas quite uncomfortble but noe pain per se;denies fever, headache, malase, chills; seemed to start after mowing the grass early in the am before the heat of the day and was somewhat dark and not exactly clear what she may movwed over besides grass; no hx of this prior per pt, no recnet new meds or OTC except did try otc cortaid which did not help;  has not used heer nsaid in several wks; denies tongue sweling or sob ; no CP, sob, doe, orrthopnea, pnd or LE edema  Problems Prior to Update: 1)  Ankle Pain, Right  (ICD-719.47)  Medications Prior to Update: 1)  Percocet 5-325 Mg  Tabs (Oxycodone-Acetaminophen) .Marland Kitchen.. 1 By Mouth Q 6 Hrs As Needed Pain 2)  Meloxicam 15 Mg Tabs (Meloxicam) .... One By Mouth Daily Pc 3)  Vitamin D3 1000 Unit  Tabs (Cholecalciferol) .Marland Kitchen.. 1 By Mouth Daily  Current Medications (verified): 1)  Percocet 5-325 Mg  Tabs (Oxycodone-Acetaminophen) .Marland Kitchen.. 1 By Mouth Q 6 Hrs As Needed Pain 2)  Meloxicam 15 Mg Tabs (Meloxicam) .... One By Mouth Daily Pc 3)  Vitamin D3 1000 Unit  Tabs (Cholecalciferol) .Marland Kitchen.. 1 By Mouth Daily 4)  Prednisone 10 Mg Tabs (Prednisone) .... 4po Qd For 3days, Then 3po Qd For 3days, Then 2po Qd For 3days, Then 1po Qd For 3 Days, Then Stop 5)  Hydroxyzine Hcl 25 Mg Tabs (Hydroxyzine Hcl) .Marland Kitchen.. 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   Head:  normocephalic and atraumatic.   Eyes:  vision grossly intact, pupils equal, and pupils round.   Ears:  R ear normal and L ear normal.   Nose:  no external deformity and no nasal discharge.   Mouth:  no gingival abnormalities and pharynx pink and moist.   Neck:  supple and no masses.   Lungs:  normal respiratory effort and normal breath sounds.   Heart:  normal rate and regular rhythm.   Extremities:  no edema, no ulcers  Skin:  diffuse and marked nontender eyrthema to bilat face , ant neck and upper chest areas without vesicular rash c/w contact dermatitis   Impression & Recommendations:  Problem # 1:  CONTACT DERMATITIS (ICD-692.9)  Her updated medication list  for this problem includes:    Prednisone 10 Mg Tabs (Prednisone) .Marland KitchenMarland KitchenMarland KitchenMarland Kitchen 4po qd for 3days, then 3po qd for 3days, then 2po qd for 3days, then 1po qd for 3 days, then stop marked symptoms, to tx with depomedrol shot today, prednisone burst and taper off, caldryl OTC and atarax as needed itching, f/u any worsening s/s  Complete Medication List: 1)  Percocet 5-325 Mg Tabs (Oxycodone-acetaminophen) .Marland Kitchen.. 1 by mouth q 6 hrs as needed pain 2)  Meloxicam 15 Mg Tabs (Meloxicam) .... One by mouth daily pc 3)  Vitamin D3 1000 Unit Tabs (Cholecalciferol) .Marland Kitchen.. 1 by mouth daily 4)  Prednisone 10 Mg Tabs (Prednisone) .... 4po qd for 3days, then 3po qd for 3days, then 2po qd for 3days, then 1po qd for 3 days, then stop 5)  Hydroxyzine Hcl 25 Mg Tabs (Hydroxyzine hcl) .Marland Kitchen.. 1 by mouth q 6 hrs as needed for  itching  Patient Instructions: 1)  you had the steroid shot today 2)  Please take all new medications as prescribed 3)  Continue all medications that you may have been taking previously  4)  You can also use Caladryl (OTC) for the rash and itching 5)  Continue all medications that you may have been taking previously  6)  Please schedule an appointment with your primary doctor as needed Prescriptions: HYDROXYZINE HCL 25 MG TABS (HYDROXYZINE HCL) 1 by mouth q 6 hrs as needed for itching  #60 x 1   Entered and Authorized by:   Corwin Levins MD   Signed by:   Corwin Levins MD on 04/25/2009   Method used:   Print then Give to Patient   RxID:   [id number] PREDNISONE 10 MG TABS (PREDNISONE) 4po qd for 3days, then 3po qd for 3days, then 2po qd for 3days, then 1po qd for 3 days, then stop  #30 x 0   Entered and Authorized by:   Corwin Levins MD   Signed by:   Corwin Levins MD on 04/25/2009   Method used:   Print then Give to Patient   RxID:   [id number]

## 2010-10-28 NOTE — Miscellaneous (Signed)
  Clinical Lists Changes  Problems: Added new problem of PELVIC  PAIN (ICD-789.09) - Signed Orders: Added new Referral order of CT Scan  (CT Scan) - Signed canceled order due to Ins no covering

## 2010-10-28 NOTE — Progress Notes (Signed)
Summary: needs refill Percocet  Phone Note Call from Patient Call back at [phone]   Call For: John Summary of Call: Pt needs refill for Percocet,[facility]. Initial call taken by: Verdell Face,  May 07, 2008 1:13 PM  Follow-up for Phone Call        needs to call her primary MD please - dr Laury Axon Follow-up by: Corwin Levins MD,  May 07, 2008 2:08 PM  Additional Follow-up for Phone Call Additional follow up Details #1::        May 07, 2008 2:13 PM  called pt to inform left msg on machine to call the office Additional Follow-up by: Shelbie Proctor,  May 07, 2008 2:13 PM    Additional Follow-up for Phone Call Additional follow up Details #2::    May 07, 2008 3:21 PM PT CAREGIVER WAS INFORMED SPOKE WITH HER MADE AWARE Follow-up by: Shelbie Proctor,  May 07, 2008 3:21 PM

## 2010-10-28 NOTE — Assessment & Plan Note (Signed)
Summary: pain when urinating-lb   Vital Signs:  Patient profile:   60 year old female Height:      67 inches Weight:      153 pounds BMI:     24.05 O2 Sat:      97 % on Room air Temp:     99.2 degrees F oral Pulse rate:   88 / minute BP sitting:   112 / 80  (left arm) Cuff size:   regular  Vitals Entered By: Zella Ball Ewing CMA Duncan Dull) (July 09, 2010 4:37 PM)  O2 Flow:  Room air CC: Painful to urinate/RE   CC:  Painful to urinate/RE.  History of Present Illness: here for acute - c/.o 2-3 days onset low mid abd pain and discomfort, with dysuria and mild freq, but no urgency , hematuria, back or flank pain, high fever, chills, n/v, rash or other complaints except for general weakness and malaise, and increased anxiety with pain onset. Cant recall last UTI.  No recent other problem with incontinence or other GU symtpoms.  No hx of pyelo or hospn.  No hx of stone.  Not confused today. No falls or injury  Problems Prior to Update: 1)  Vitamin D Deficiency  (ICD-268.9) 2)  Dysuria  (ICD-788.1) 3)  Pelvic Pain  (ICD-789.09) 4)  Encounter For Long-term Use of Other Medications  (ICD-V58.69) 5)  Abdominal Pain, Unspecified Site  (ICD-789.00) 6)  Contact Dermatitis  (ICD-692.9) 7)  Ankle Pain, Right  (ICD-719.47)  Medications Prior to Update: 1)  Vitamin D3 1000 Unit  Tabs (Cholecalciferol) .Marland Kitchen.. 1 By Mouth Daily  Current Medications (verified): 1)  Vitamin D3 1000 Unit  Tabs (Cholecalciferol) .Marland Kitchen.. 1 By Mouth Daily 2)  Cephalexin 500 Mg Caps (Cephalexin) .Marland Kitchen.. 1 By Mouth Three Times A Day 3)  Tramadol Hcl 50 Mg Tabs (Tramadol Hcl) .Marland Kitchen.. 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 s/p c-section   Review of Systems       all otherwise negative per pt -    Physical Exam  General:  alert and well-developed.  , mild ill  Head:  normocephalic and atraumatic.   Eyes:  vision grossly intact, pupils equal, and pupils round.   Ears:  R ear normal and L ear normal.   Nose:  no external deformity and no nasal discharge.   Mouth:  no gingival abnormalities and pharynx pink and moist.   Neck:  supple and no masses.   Lungs:  normal respiratory effort and normal breath sounds.   Heart:  normal rate and regular rhythm.   Abdomen:  soft and normal bowel sounds.  with moderate low mid abd tender, bladder nondistended, no guarding or rebound Msk:  no flank tender Extremities:  no edema, no erythema  Skin:  no rashes.   Psych:  moderately anxious.     Impression & Recommendations:  Problem # 1:  DYSURIA (ICD-788.1)  prob UTI with rather marked lower mid abd tender - for rocephin IM today, and cephalexin course ;   to check urine cx  Orders: UA Dipstick W/ Micro (manual) (73710) T-Culture, Urine (62694-85462) Admin of Therapeutic Inj  intramuscular or subcutaneous (70350) Rocephin  250mg  (K9381)  Her updated medication list for this problem includes:    Cephalexin 500 Mg Caps (Cephalexin) .Marland Kitchen... 1 by mouth three times a day  Problem # 2:  VITAMIN D DEFICIENCY (ICD-268.9) d/w pt - to cont the vit d supplement  Complete Medication List: 1)  Vitamin D3 1000 Unit Tabs (Cholecalciferol) .Marland Kitchen.. 1 by mouth daily 2)  Cephalexin 500 Mg Caps (Cephalexin) .Marland Kitchen.. 1 by mouth three times a day 3)  Tramadol Hcl 50 Mg Tabs (Tramadol hcl) .Marland Kitchen.. 1 by mouth q 6 hrs as needed pain  Patient Instructions: 1)  you had the antibiotic shot today 2)  Please take all new medications as prescribed - the antibiotic and the pain medicine for home 3)  Continue all previous medications as before this visit  4)  Please go to the Lab in the basement for your urine  tests today  5)  Please call the number on the [facility] for results of your testing  6)  Please call if not improved, or go to ER for worsening pain or fever and chills or confusion, weakness 7)  Please schedule a follow-up appointment in 1 month with CPX labs - the office will call Prescriptions: TRAMADOL HCL 50 MG TABS (TRAMADOL HCL) 1 by mouth q 6 hrs as needed pain  #40 x 1   Entered and Authorized by:   Corwin Levins MD   Signed by:   Corwin Levins MD on 07/09/2010   Method used:   Print then Give to Patient   RxID:   [id number] CEPHALEXIN 500 MG CAPS (CEPHALEXIN) 1 by mouth three times a day  #30 x 0   Entered and Authorized by:   Corwin Levins MD   Signed by:   Corwin Levins MD on 07/09/2010   Method used:   Print then Give to Patient   RxID:   [id number]    Medication Administration  Injection # 1:    Medication: Rocephin  250mg     Diagnosis: DYSURIA (ICD-788.1)    Route: IM    Site: RUOQ gluteus    Exp Date: 01/26/2013    Lot #: [id number]    Mfr: Novartis    Comments: 1 GM    Patient tolerated injection without complications    Given by: Lamar Sprinkles, CMA (July 09, 2010 5:30 PM)  Orders Added: 1)  UA Dipstick W/ Micro (manual) [81000] 2)  T-Culture, Urine [[id number]] 3)  Admin of Therapeutic Inj  intramuscular or subcutaneous [96372] 4)  Rocephin  250mg  [J0696] 5)  Est. Patient Level III [01027]   Laboratory Results   Urine Tests    Routine Urinalysis   Color: yellow Appearance: Hazy Glucose: negative   (Normal Range: Negative) Bilirubin: negative   (Normal Range: Negative) Ketone: negative   (Normal Range: Negative) Spec. Gravity: 1.025   (Normal Range: 1.003-1.035) Blood: moderate   (Normal Range: Negative) pH: 5.0   (Normal Range: 5.0-8.0) Protein: negative   (Normal Range: Negative) Urobilinogen: 0.2   (Normal Range: 0-1) Nitrite: negative   (Normal Range: Negative) Leukocyte Esterace: trace   (Normal Range: Negative)          Medication Administration  Injection # 1:    Medication: Rocephin  250mg     Diagnosis: DYSURIA (ICD-788.1)    Route: IM    Site: RUOQ gluteus    Exp Date: 01/26/2013    Lot #: [id number]    Mfr: Novartis    Comments: 1 GM    Patient tolerated injection  without complications    Given by: Lamar Sprinkles, CMA (July 09, 2010 5:30 PM)  Orders Added: 1)  UA Dipstick W/ Micro (manual) [81000] 2)  T-Culture, Urine [[id number]] 3)  Admin of Therapeutic Inj  intramuscular or subcutaneous [96372] 4)  Rocephin  250mg  [J0696] 5)  Est. Patient Level III [25366]

## 2010-10-28 NOTE — Assessment & Plan Note (Signed)
Summary: SEE PHONE NOTE ON PERCOCET/ SPRAINED ANKLE/NWS $50   Vital Signs:  Patient Profile:   60 Years Old Female Weight:      161 pounds Temp:     98.1 degrees F oral Pulse rate:   83 / minute BP sitting:   100 / 76  (right arm)  Vitals Entered By: Tora Perches (May 08, 2008 1:21 PM)                 Chief Complaint:  Multiple medical problems or concerns.  History of Present Illness: C/o pain in R ankle - severe. Injured it 1 mo ago, no Fx on x ray. Has an appt with Dr Penni Bombard on 8/14. Came in with her neighbor.    Current Allergies: No known allergies   Past Medical History:    Reviewed history from 03/27/2008 and no changes required:       Unremarkable per pt   Family History:    Reviewed history from 03/27/2008 and no changes required:       mother with heart disease       3 brothers - perfect health per pt  Social History:    Reviewed history from 03/27/2008 and no changes required:       Married       1 child       homemaker       Never Smoked       Alcohol use-no     Physical Exam  General:     Crying with pain Mouth:     Oral mucosa and oropharynx without lesions or exudates.  Teeth in good repair. Msk:     Has a pair of crutches R lat ankle is a little puffy. She screems with pain when I examine her ankle  Pulses:     R and L carotid,radial,femoral,dorsalis pedis and posterior tibial pulses are full and equal bilaterally Extremities:     No clubbing, cyanosis, edema, or deformity noted with normal full range of motion of all joints.   Neurologic:     No cranial nerve deficits noted. Station and gait are normal. Plantar reflexes are down-going bilaterally. DTRs are symmetrical throughout. Sensory, motor and coordinative functions appear intact. Skin:     Intact without suspicious lesions or rashes    Impression & Recommendations:  Problem # 1:  ANKLE PAIN, RIGHT (EAV-409.81) Assessment: Unchanged Keep appt with Dr Penni Bombard on  8/14. I'll give her Percocet #40 and Mobic. ACE wrap, heat. Start Mobic pc  Complete Medication List: 1)  Percocet 5-325 Mg Tabs (Oxycodone-acetaminophen) .Marland Kitchen.. 1 by mouth q 6 hrs as needed pain 2)  Meloxicam 15 Mg Tabs (Meloxicam) .... One by mouth daily pc 3)  Vitamin D3 1000 Unit Tabs (Cholecalciferol) .Marland Kitchen.. 1 by mouth daily   Patient Instructions: 1)  Please schedule a follow-up appointment in 2 months Dr Jonny Ruiz.   Prescriptions: MELOXICAM 15 MG TABS (MELOXICAM) one by mouth daily pc  #30 x 1   Entered and Authorized by:   Tresa Garter MD   Signed by:   Tresa Garter MD on 05/08/2008   Method used:   Print then Give to Patient   RxID:   [id number] PERCOCET 5-325 MG  TABS (OXYCODONE-ACETAMINOPHEN) 1 by mouth q 6 hrs as needed pain  #40 x 0   Entered and Authorized by:   Tresa Garter MD   Signed by:   Tresa Garter MD on 05/08/2008   Method used:  Print then Give to Patient   RxID:   [id number]  ]

## 2011-07-13 ENCOUNTER — Other Ambulatory Visit (HOSPITAL_COMMUNITY): Payer: Self-pay | Admitting: Obstetrics and Gynecology

## 2011-07-13 DIAGNOSIS — Z1231 Encounter for screening mammogram for malignant neoplasm of breast: Secondary | ICD-10-CM

## 2011-08-28 ENCOUNTER — Ambulatory Visit (HOSPITAL_COMMUNITY)
Admission: RE | Admit: 2011-08-28 | Discharge: 2011-08-28 | Disposition: A | Discharge status: Home / Self Care | Payer: BC Managed Care – PPO | Source: Ambulatory Visit | Attending: Obstetrics and Gynecology | Admitting: Obstetrics and Gynecology

## 2011-08-28 DIAGNOSIS — Z1231 Encounter for screening mammogram for malignant neoplasm of breast: Secondary | ICD-10-CM | POA: Insufficient documentation

## 2012-01-15 ENCOUNTER — Encounter: Payer: Self-pay | Admitting: Internal Medicine

## 2012-01-15 DIAGNOSIS — Z0001 Encounter for general adult medical examination with abnormal findings: Secondary | ICD-10-CM | POA: Insufficient documentation

## 2012-01-19 ENCOUNTER — Ambulatory Visit: Payer: BC Managed Care – PPO | Admitting: Internal Medicine

## 2012-01-22 ENCOUNTER — Other Ambulatory Visit (INDEPENDENT_AMBULATORY_CARE_PROVIDER_SITE_OTHER): Payer: BC Managed Care – PPO

## 2012-01-22 ENCOUNTER — Encounter: Payer: Self-pay | Admitting: Internal Medicine

## 2012-01-22 ENCOUNTER — Ambulatory Visit (INDEPENDENT_AMBULATORY_CARE_PROVIDER_SITE_OTHER): Payer: BC Managed Care – PPO | Admitting: Internal Medicine

## 2012-01-22 VITALS — BP 110/60 | HR 72 | Temp 97.4°F | Ht 67.0 in | Wt 149.4 lb

## 2012-01-22 DIAGNOSIS — Z0000 Encounter for general adult medical examination without abnormal findings: Secondary | ICD-10-CM

## 2012-01-22 DIAGNOSIS — Z79899 Other long term (current) drug therapy: Secondary | ICD-10-CM

## 2012-01-22 DIAGNOSIS — F32A Depression, unspecified: Secondary | ICD-10-CM

## 2012-01-22 DIAGNOSIS — F329 Major depressive disorder, single episode, unspecified: Secondary | ICD-10-CM

## 2012-01-22 DIAGNOSIS — L0201 Cutaneous abscess of face: Secondary | ICD-10-CM

## 2012-01-22 HISTORY — DX: Depression, unspecified: F32.A

## 2012-01-22 LAB — BASIC METABOLIC PANEL
BUN: 13 mg/dL (ref 6–23)
CO2: 27 mEq/L (ref 19–32)
Calcium: 9.4 mg/dL (ref 8.4–10.5)
Chloride: 106 mEq/L (ref 96–112)
Creatinine, Ser: 0.8 mg/dL (ref 0.4–1.2)
GFR: 82.79 mL/min (ref >60.00)
Glucose, Bld: 94 mg/dL (ref 70–99)
Potassium: 4.2 mEq/L (ref 3.5–5.1)
Sodium: 142 mEq/L (ref 135–145)

## 2012-01-22 LAB — CBC WITH DIFFERENTIAL/PLATELET
Basophils Absolute: 0 10*3/uL (ref 0.0–0.1)
Basophils Relative: 0.3 % (ref 0.0–3.0)
Eosinophils Absolute: 0.2 10*3/uL (ref 0.0–0.7)
Eosinophils Relative: 2.5 % (ref 0.0–5.0)
HCT: 40.6 % (ref 36.0–46.0)
Hemoglobin: 13.6 g/dL (ref 12.0–15.0)
Lymphocytes Relative: 25.2 % (ref 12.0–46.0)
Lymphs Abs: 2 10*3/uL (ref 0.7–4.0)
MCHC: 33.5 g/dL (ref 30.0–36.0)
MCV: 93.4 fl (ref 78.0–100.0)
Monocytes Absolute: 0.5 10*3/uL (ref 0.1–1.0)
Monocytes Relative: 5.9 % (ref 3.0–12.0)
Neutro Abs: 5.3 10*3/uL (ref 1.4–7.7)
Neutrophils Relative %: 66.1 % (ref 43.0–77.0)
Platelets: 275 10*3/uL (ref 150.0–400.0)
RBC: 4.35 Mil/uL (ref 3.87–5.11)
RDW: 14.3 % (ref 11.5–14.6)
WBC: 7.9 10*3/uL (ref 4.5–10.5)

## 2012-01-22 LAB — URINALYSIS, ROUTINE W REFLEX MICROSCOPIC
Bilirubin Urine: NEGATIVE
Ketones, ur: NEGATIVE
Nitrite: POSITIVE
Specific Gravity, Urine: 1.015 (ref 1.000–1.030)
Total Protein, Urine: NEGATIVE
Urine Glucose: NEGATIVE
Urobilinogen, UA: 1 (ref 0.0–1.0)
pH: 7 (ref 5.0–8.0)

## 2012-01-22 LAB — LIPID PANEL
Cholesterol: 237 mg/dL — ABNORMAL HIGH (ref 0–200)
HDL: 49.6 mg/dL (ref >39.00)
Total CHOL/HDL Ratio: 5
Triglycerides: 193 mg/dL — ABNORMAL HIGH (ref 0.0–149.0)
VLDL: 38.6 mg/dL (ref 0.0–40.0)

## 2012-01-22 LAB — HEPATIC FUNCTION PANEL
ALT: 14 U/L (ref 0–35)
AST: 19 U/L (ref 0–37)
Albumin: 4.1 g/dL (ref 3.5–5.2)
Alkaline Phosphatase: 92 U/L (ref 39–117)
Bilirubin, Direct: 0 mg/dL (ref 0.0–0.3)
Total Bilirubin: 0.3 mg/dL (ref 0.3–1.2)
Total Protein: 7.4 g/dL (ref 6.0–8.3)

## 2012-01-22 LAB — LDL CHOLESTEROL, DIRECT: Direct LDL: 163 mg/dL

## 2012-01-22 LAB — TSH: TSH: 1.39 u[IU]/mL (ref 0.35–5.50)

## 2012-01-22 MED ORDER — DOXYCYCLINE HYCLATE 100 MG PO TABS: 100.0000 mg | ORAL_TABLET | Freq: Two times a day (BID) | ORAL | Status: AC

## 2012-01-22 MED ORDER — ASPIRIN 81 MG PO TBEC: 81.0000 mg | DELAYED_RELEASE_TABLET | Freq: Every day | ORAL | Status: DC

## 2012-01-22 MED ORDER — ESCITALOPRAM OXALATE 10 MG PO TABS: 10.0000 mg | ORAL_TABLET | Freq: Every day | ORAL | Status: DC

## 2012-01-22 MED ORDER — TETANUS-DIPHTH-ACELL PERTUSSIS 5-2.5-18.5 LF-MCG/0.5 IM SUSP: 0.5000 mL | Freq: Once | INTRAMUSCULAR | Status: DC

## 2012-01-22 NOTE — Assessment & Plan Note (Signed)
Overall doing well, age appropriate education and counseling updated, referrals for preventative services and immunizations addressed, dietary and smoking counseling addressed, most recent labs and ECG reviewed.  I have personally reviewed and have noted: 1) the patient's medical and social history 2) The pt's use of alcohol, tobacco, and illicit drugs 3) The patient's current medications and supplements 4) Functional ability including ADL's, fall risk, home safety risk, hearing and visual impairment 5) Diet and physical activities 6) Evidence for depression or mood disorder 7) The patient's height, weight, and BMI have been recorded in the chart I have made referrals, and provided counseling and education based on review of the above For labs, declines colonscpopy, for tetanus today

## 2012-01-22 NOTE — Patient Instructions (Addendum)
Take all new medications as prescribed - the antibiotic  Take all new medications as prescribed - the lexapro (for nerves and mood) (call if you get a lot of nausea or start of sluggishness) Please start Aspirin 81 mg - 1 per day - COATED only (to protect the stomach) - to reduce the risk of stroke and heart attack You had the Tetanus shot today Please call if you change your mind about having the Screening Colonoscopy Please remember to followup with your GYN for the yearly pap smear and/or mammogram as you do Please go to LAB in the Basement for the blood and/or urine tests to be done today You will be contacted by phone if any changes need to be made immediately.  Otherwise, you will receive a letter about your results with an explanation. Please return in 1 year for your yearly visit, or sooner if needed, with Lab testing done 3-5 days before

## 2012-01-23 ENCOUNTER — Encounter: Payer: Self-pay | Admitting: Internal Medicine

## 2012-01-23 NOTE — Progress Notes (Signed)
Subjective:    Patient ID: Amber Hicks, female    DOB: 08-28-1951, 61 y.o.   MRN: [id number]  HPI  Here for wellness and f/u;  Overall doing ok;  Pt denies CP, worsening SOB, DOE, wheezing, orthopnea, PND, worsening LE edema, palpitations, dizziness or syncope.  Pt denies neurological change such as new Headache, facial or extremity weakness.  Pt denies polydipsia, polyuria, or low sugar symptoms. Pt states overall good compliance with treatment and medications, good tolerability, and trying to follow lower cholesterol diet.  Pt has had worsening depressive symptoms, but no suicidal ideation or panic. No fever, wt loss, night sweats, loss of appetite, or other constitutional symptoms.  Pt states good ability with ADL's, low fall risk, home safety reviewed and adequate, no significant changes in hearing or vision, and occasionally active with exercise. Also incidentally with an area of red, tender swelling to face just lateral to left eye.  Past Medical History  Diagnosis Date  . VITAMIN D DEFICIENCY 07/09/2010    Qualifier: Diagnosis of  By: Jonny Ruiz MD, Len Blalock   . Depression 01/22/2012   Past Surgical History  Procedure Date  . Ceaserian section     reports that she has never smoked. She has never used smokeless tobacco. She reports that she does not drink alcohol or use illicit drugs. family history includes Heart disease in her mother. No Known Allergies Current Outpatient Prescriptions on File Prior to Visit  Medication Sig Dispense Refill  . escitalopram (LEXAPRO) 10 MG tablet Take 1 tablet (10 mg total) by mouth daily.  90 tablet  3   No current facility-administered medications on file prior to visit.   Current Outpatient Prescriptions on File Prior to Visit  Medication Sig Dispense Refill  . escitalopram (LEXAPRO) 10 MG tablet Take 1 tablet (10 mg total) by mouth daily.  90 tablet  3   No current facility-administered medications on file prior to visit.   Review of  Systems Review of Systems  Constitutional: Negative for diaphoresis, activity change, appetite change and unexpected weight change.  HENT: Negative for hearing loss, ear pain, facial swelling, mouth sores and neck stiffness.   Eyes: Negative for pain, redness and visual disturbance.  Respiratory: Negative for shortness of breath and wheezing.   Cardiovascular: Negative for chest pain and palpitations.  Gastrointestinal: Negative for diarrhea, blood in stool, abdominal distention and rectal pain.  Genitourinary: Negative for hematuria, flank pain and decreased urine volume.  Musculoskeletal: Negative for myalgias and joint swelling.  Skin: Negative for color change and wound.  Neurological: Negative for syncope and numbness.  Hematological: Negative for adenopathy.  Psychiatric/Behavioral: Negative for hallucinations, self-injury, decreased concentration and agitation.      Objective:   Physical Exam BP 110/60  Pulse 72  Temp(Src) 97.4 F (36.3 C) (Oral)  Ht 5\' 7"  (1.702 m)  Wt 149 lb 6 oz (67.756 kg)  BMI 23.40 kg/m2  SpO2 97% Physical Exam  VS noted Constitutional: Pt is oriented to person, place, and time. Appears well-developed and well-nourished.  HENT:  Head: Normocephalic and atraumatic.  Right Ear: External ear normal.  Left Ear: External ear normal.  Nose: Nose normal.  Mouth/Throat: Oropharynx is clear and moist.  Eyes: Conjunctivae and EOM are normal. Pupils are equal, round, and reactive to light.  Neck: Normal range of motion. Neck supple. No JVD present. No tracheal deviation present.  Cardiovascular: Normal rate, regular rhythm, normal heart sounds and intact distal pulses.   Pulmonary/Chest: Effort normal and breath  sounds normal.  Abdominal: Soft. Bowel sounds are normal. There is no tenderness.  Musculoskeletal: Normal range of motion. Exhibits no edema.  Lymphadenopathy:  Has no cervical adenopathy.  Neurological: Pt is alert and oriented to person, place,  and time. Pt has normal reflexes. No cranial nerve deficit.  Skin: Skin is warm and dry. No rash noted. except for 10 mm area red, tender, swelling to face just lateral to left eye lateral canthus Psychiatric:  Has marked depressed affect, 2+ nervous    Assessment & Plan:

## 2012-01-23 NOTE — Assessment & Plan Note (Signed)
Moderate it seems, for lexapro 10 qd, declines counseling,  to f/u any worsening symptoms or concerns

## 2012-01-23 NOTE — Assessment & Plan Note (Signed)
Very small, nonfluctuant , for antibx course,  to f/u any worsening symptoms or concerns

## 2012-01-24 ENCOUNTER — Encounter: Payer: Self-pay | Admitting: Internal Medicine

## 2012-01-24 DIAGNOSIS — E782 Mixed hyperlipidemia: Secondary | ICD-10-CM | POA: Insufficient documentation

## 2012-01-24 DIAGNOSIS — E785 Hyperlipidemia, unspecified: Secondary | ICD-10-CM

## 2012-01-24 HISTORY — DX: Mixed hyperlipidemia: E78.2

## 2012-01-24 HISTORY — DX: Hyperlipidemia, unspecified: E78.5

## 2012-03-21 ENCOUNTER — Encounter: Payer: Self-pay | Admitting: Internal Medicine

## 2012-03-21 ENCOUNTER — Ambulatory Visit (INDEPENDENT_AMBULATORY_CARE_PROVIDER_SITE_OTHER): Payer: BC Managed Care – PPO | Admitting: Internal Medicine

## 2012-03-21 VITALS — BP 140/72 | HR 65 | Temp 98.7°F | Resp 16 | Wt 151.0 lb

## 2012-03-21 DIAGNOSIS — H00019 Hordeolum externum unspecified eye, unspecified eyelid: Secondary | ICD-10-CM

## 2012-03-21 MED ORDER — CIPROFLOXACIN HCL 0.3 % OP OINT: TOPICAL_OINTMENT | Freq: Three times a day (TID) | OPHTHALMIC | Status: AC

## 2012-03-21 MED ORDER — DOXYCYCLINE HYCLATE 100 MG PO TBEC: 100.0000 mg | DELAYED_RELEASE_TABLET | Freq: Two times a day (BID) | ORAL | Status: AC

## 2012-03-21 NOTE — Patient Instructions (Signed)
Sty  A sty (hordeolum) is an infection of a gland in the eyelid located at the base of the eyelash. A sty may develop a white or yellow head of pus. It can be puffy (swollen). Usually, the sty will burst and pus will come out on its own. They do not leave lumps in the eyelid once they drain.  A sty is often confused with another form of cyst of the eyelid called a chalazion. Chalazions occur within the eyelid and not on the edge where the bases of the eyelashes are. They often are red, sore and then form firm lumps in the eyelid.  CAUSES    Germs (bacteria).   Lasting (chronic) eyelid inflammation.  SYMPTOMS    Tenderness, redness and swelling along the edge of the eyelid at the base of the eyelashes.   Sometimes, there is a white or yellow head of pus. It may or may not drain.  DIAGNOSIS   An ophthalmologist will be able to distinguish between a sty and a chalazion and treat the condition appropriately.   TREATMENT    Styes are typically treated with warm packs (compresses) until drainage occurs.   In rare cases, medicines that kill germs (antibiotics) may be prescribed. These antibiotics may be in the form of drops, cream or pills.   If a hard lump has formed, it is generally necessary to do a small incision and remove the hardened contents of the cyst in a minor surgical procedure done in the office.   In suspicious cases, your caregiver may send the contents of the cyst to the lab to be certain that it is not a rare, but dangerous form of cancer of the glands of the eyelid.  HOME CARE INSTRUCTIONS    Wash your hands often and dry them with a clean towel. Avoid touching your eyelid. This may spread the infection to other parts of the eye.   Apply heat to your eyelid for 10 to 20 minutes, several times a day, to ease pain and help to heal it faster.   Do not squeeze the sty. Allow it to drain on its own. Wash your eyelid carefully 3 to 4 times per day to remove any pus.  SEEK IMMEDIATE MEDICAL CARE IF:     Your eye becomes painful or puffy (swollen).   Your vision changes.   Your sty does not drain by itself within 3 days.   Your sty comes back within a short period of time, even with treatment.   You have redness (inflammation) around the eye.   You have a fever.  Document Released: 06/24/2005 Document Revised: 09/03/2011 Document Reviewed: 02/26/2009  [employer] Patient Information 2012 [employer]

## 2012-03-21 NOTE — Assessment & Plan Note (Signed)
Start ciloxan ophth and doxy po to treat the infection and start warm compresses and other details listed in the pt ed material

## 2012-03-21 NOTE — Progress Notes (Signed)
  Subjective:    Patient ID: Amber Hicks, female    DOB: [birth year], 61 y.o.   MRN: [id number]  Eye Problem  There is pain in the right eye. This is a new problem. The current episode started in the past 7 days. The problem has been gradually worsening. There was no injury mechanism. The pain is at a severity of 1/10. The pain is mild. There is no known exposure to pink eye. She does not wear contacts. Associated symptoms include an eye discharge. Pertinent negatives include no blurred vision, double vision, eye redness, fever, foreign body sensation, itching, nausea, photophobia, recent URI or vomiting. She has tried nothing for the symptoms.      Review of Systems  Constitutional: Negative for fever, chills, diaphoresis, activity change, appetite change, fatigue and unexpected weight change.  Eyes: Positive for discharge. Negative for blurred vision, double vision, photophobia, pain, redness, itching and visual disturbance.  Respiratory: Negative.   Cardiovascular: Negative.   Gastrointestinal: Negative.  Negative for nausea and vomiting.  Genitourinary: Negative.   Musculoskeletal: Negative.   Skin: Negative for color change, itching, pallor, rash and wound.  Neurological: Negative for dizziness, facial asymmetry, light-headedness and headaches.  Hematological: Negative for adenopathy. Does not bruise/bleed easily.  Psychiatric/Behavioral: Negative.        Objective:   Physical Exam  Vitals reviewed. Constitutional: She is oriented to person, place, and time. She appears well-developed and well-nourished.  Non-toxic appearance. She does not have a sickly appearance. She does not appear ill. No distress.  HENT:  Head: Normocephalic and atraumatic.  Mouth/Throat: Oropharynx is clear and moist. No oropharyngeal exudate.  Eyes: Pupils are equal, round, and reactive to light. Right eye exhibits discharge. Right eye exhibits no chemosis, no exudate and no hordeolum. No foreign body  present in the right eye. Left eye exhibits no chemosis, no discharge, no exudate and no hordeolum. No foreign body present in the left eye. Right conjunctiva is not injected. Right conjunctiva has no hemorrhage. Left conjunctiva is not injected. Left conjunctiva has no hemorrhage. No scleral icterus. Right eye exhibits normal extraocular motion and no nystagmus. Left eye exhibits normal extraocular motion and no nystagmus. Right pupil is round and reactive. Left pupil is round and reactive. Pupils are equal.         Right lower eyelid shows a sty with a pustule and associated erythema and swelling onto the face  Neck: Normal range of motion. Neck supple. No JVD present. No tracheal deviation present. No thyromegaly present.  Cardiovascular: Normal rate, regular rhythm and normal heart sounds.   Pulmonary/Chest: Effort normal and breath sounds normal. No stridor.  Abdominal: Soft. Bowel sounds are normal. She exhibits no distension. There is no tenderness. There is no rebound.  Musculoskeletal: Normal range of motion. She exhibits no edema and no tenderness.  Lymphadenopathy:    She has no cervical adenopathy.  Neurological: She is oriented to person, place, and time.  Skin: Skin is warm and dry. No rash noted. She is not diaphoretic. There is erythema. No pallor.  Psychiatric: She has a normal mood and affect. Her behavior is normal. Judgment and thought content normal.          Assessment & Plan:

## 2012-04-15 ENCOUNTER — Ambulatory Visit (INDEPENDENT_AMBULATORY_CARE_PROVIDER_SITE_OTHER): Payer: BC Managed Care – PPO | Admitting: Internal Medicine

## 2012-04-15 ENCOUNTER — Ambulatory Visit (INDEPENDENT_AMBULATORY_CARE_PROVIDER_SITE_OTHER)
Admission: RE | Admit: 2012-04-15 | Discharge: 2012-04-15 | Disposition: A | Discharge status: Home / Self Care | Payer: BC Managed Care – PPO | Source: Ambulatory Visit | Attending: Internal Medicine | Admitting: Internal Medicine

## 2012-04-15 ENCOUNTER — Encounter: Payer: Self-pay | Admitting: Internal Medicine

## 2012-04-15 VITALS — BP 130/72 | HR 82 | Temp 97.4°F | Wt 150.1 lb

## 2012-04-15 DIAGNOSIS — M79609 Pain in unspecified limb: Secondary | ICD-10-CM

## 2012-04-15 DIAGNOSIS — F329 Major depressive disorder, single episode, unspecified: Secondary | ICD-10-CM

## 2012-04-15 DIAGNOSIS — H109 Unspecified conjunctivitis: Secondary | ICD-10-CM

## 2012-04-15 DIAGNOSIS — F32A Depression, unspecified: Secondary | ICD-10-CM

## 2012-04-15 DIAGNOSIS — M79674 Pain in right toe(s): Secondary | ICD-10-CM

## 2012-04-15 DIAGNOSIS — H01006 Unspecified blepharitis left eye, unspecified eyelid: Secondary | ICD-10-CM

## 2012-04-15 DIAGNOSIS — H01009 Unspecified blepharitis unspecified eye, unspecified eyelid: Secondary | ICD-10-CM

## 2012-04-15 IMAGING — CR DG TOE 2ND 2+V*R*
2 series · 2 of 2 positions shown · non-contrast
Comparison: [DATE], right ankle

CLINICAL DATA: Pain, redness, swelling.  Injury 3 days ago.

RIGTH SECOND TOE

[view not recorded (1 of 2)]
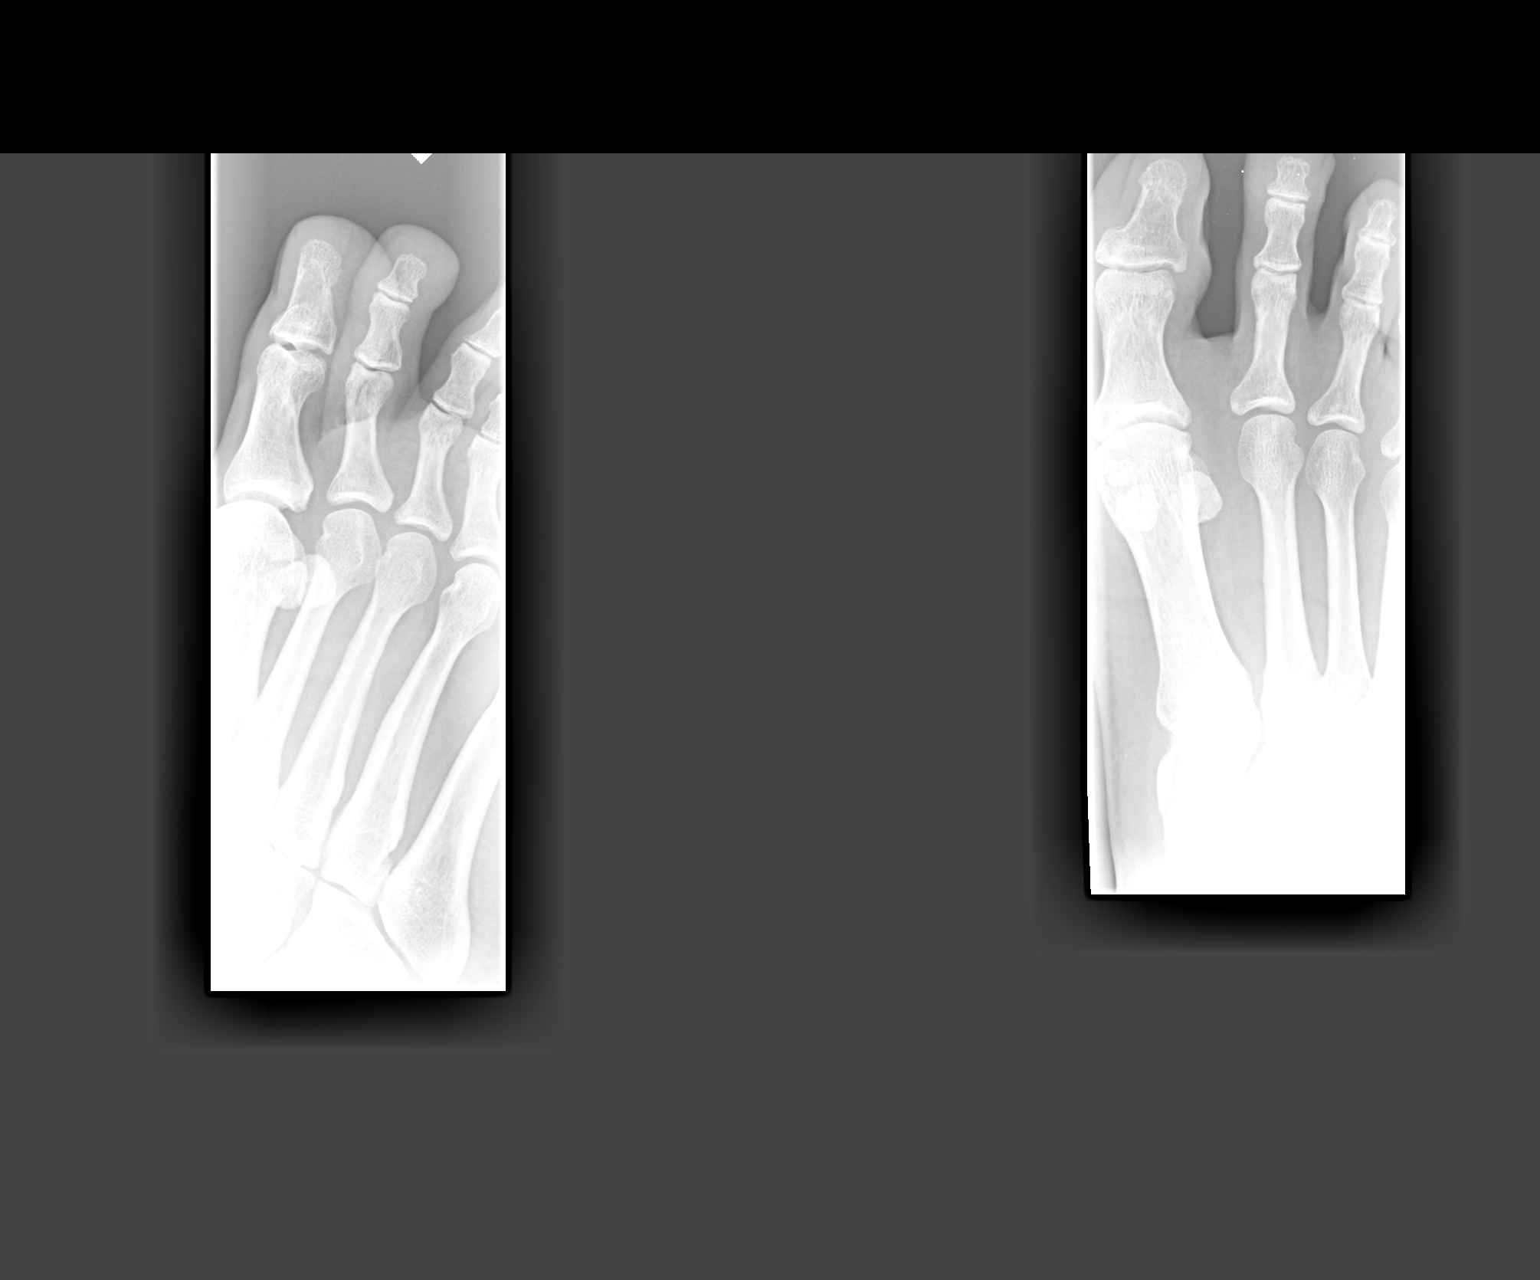

[view not recorded (2 of 2)]
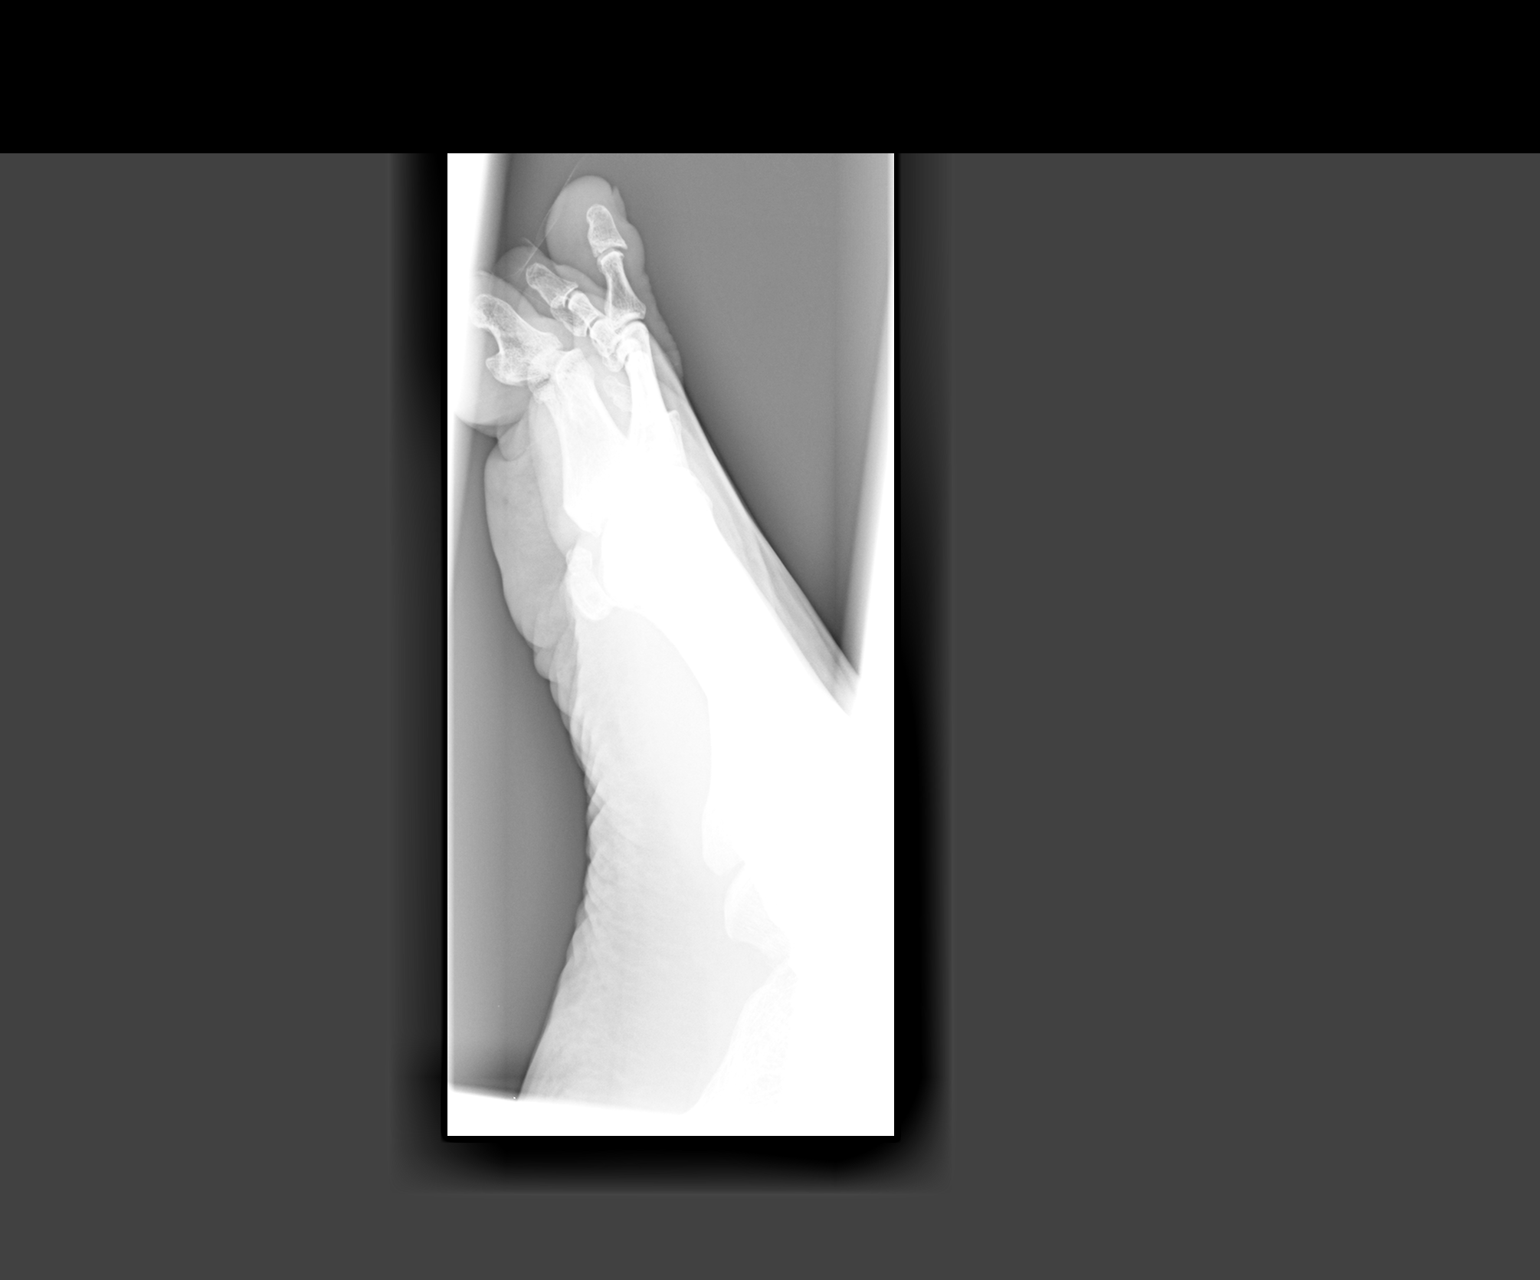

[2 of 2 positions shown; findings below may reference images not displayed]

FINDINGS: There is soft tissue swelling of the second digit.  No
evidence for acute fracture or subluxation.  No radiopaque foreign
body or soft tissue gas identified.
IMPRESSION: 1.  Soft tissue swelling.
2. No evidence for acute osseous abnormality.

## 2012-04-15 MED ORDER — ERYTHROMYCIN 5 MG/GM OP OINT: TOPICAL_OINTMENT | Freq: Four times a day (QID) | OPHTHALMIC | Status: AC

## 2012-04-15 MED ORDER — CEPHALEXIN 500 MG PO CAPS: 500.0000 mg | ORAL_CAPSULE | Freq: Four times a day (QID) | ORAL | Status: AC

## 2012-04-15 MED ORDER — TRAMADOL HCL 50 MG PO TABS: 50.0000 mg | ORAL_TABLET | Freq: Four times a day (QID) | ORAL | Status: AC | PRN

## 2012-04-15 NOTE — Patient Instructions (Addendum)
Take all new medications as prescribed Continue all other medications as before Remember to "buddy tape" the right second toe to the third toe to help with pain when you are walking until better Please go to XRAY in the Basement for the x-ray test You will be contacted by phone if any changes need to be made immediately.  Otherwise, you will receive a letter about your results with an explanation.

## 2012-04-16 ENCOUNTER — Encounter: Payer: Self-pay | Admitting: Internal Medicine

## 2012-04-16 DIAGNOSIS — H109 Unspecified conjunctivitis: Secondary | ICD-10-CM | POA: Insufficient documentation

## 2012-04-16 DIAGNOSIS — M79674 Pain in right toe(s): Secondary | ICD-10-CM | POA: Insufficient documentation

## 2012-04-16 DIAGNOSIS — H01006 Unspecified blepharitis left eye, unspecified eyelid: Secondary | ICD-10-CM | POA: Insufficient documentation

## 2012-04-16 NOTE — Progress Notes (Signed)
  Subjective:    Patient ID: Amber Hicks, female    DOB: 1950/10/11, 61 y.o.   MRN: [id number]  HPI  Here with acute c/o mild to mod left eye red/tender/swelling and d/c for 2-3 days, with low grade temp, nothing makes better or worse, no blurred vision, HA, sinus symptoms.  Also with atraumatic right great toe red, tender, swellling for several days as well, mod to severe, worse to walk, nothing makes better.  No hx of gout, does not remember recent trauma.  Pt denies chest pain, increased sob or doe, wheezing, orthopnea, PND, increased LE swelling, palpitations, dizziness or syncope.   Pt denies polydipsia, polyuria.  Pt denies new neurological symptoms such as new headache, or facial or extremity weakness or numbness. Denies worsening depressive symptoms, suicidal ideation, or panic, though has ongoing anxiety, much situationally worse in the past 2 days Past Medical History  Diagnosis Date  . VITAMIN D DEFICIENCY 07/09/2010    Qualifier: Diagnosis of  By: Jonny Ruiz MD, Len Blalock   . Depression 01/22/2012  . Hyperlipidemia 01/24/2012   Past Surgical History  Procedure Date  . Ceaserian section     reports that she has never smoked. She has never used smokeless tobacco. She reports that she does not drink alcohol or use illicit drugs. family history includes Heart disease in her mother. No Known Allergies Current Outpatient Prescriptions on File Prior to Visit  Medication Sig Dispense Refill  . aspirin 81 MG EC tablet Take 1 tablet (81 mg total) by mouth daily. Swallow whole.  30 tablet  12  . escitalopram (LEXAPRO) 10 MG tablet Take 1 tablet (10 mg total) by mouth daily.  90 tablet  3   Current Facility-Administered Medications on File Prior to Visit  Medication Dose Route Frequency Provider Last Rate Last Dose  . TDaP (BOOSTRIX) injection 0.5 mL  0.5 mL Intramuscular Once Corwin Levins, MD       Review of Systems Review of Systems  Constitutional: Negative for diaphoresis and unexpected  weight change.  HENT: Negative for  tinnitus.   Eyes: Negative for photophobia and visual disturbance.  Respiratory: Negative for choking and stridor.   Gastrointestinal: Negative for vomiting and blood in stool.  Genitourinary: Negative for hematuria and decreased urine volume.  Skin: Negative for wound.  Neurological: Negative for tremors and numbness.  Psychiatric/Behavioral: Negative for decreased concentration. The patient is not hyperactive.      Objective:   Physical Exam BP 130/72  Pulse 82  Temp 97.4 F (36.3 C) (Oral)  Wt 150 lb 2 oz (68.096 kg)  SpO2 98% Physical Exam  VS noted, mild ill Constitutional: Pt appears well-developed and well-nourished.  HENT: Head: Normocephalic.  Right Ear: External ear normal.  Left Ear: External ear normal.  Bilat tm's mild erythema.  Sinus nontender.  Pharynx mild erythema Eyes: right Conjunctivae no red,  Left with 1+ erythema, mild d/c, upper and lower lids with 1+ red/tender/swelling. Pupils are equal, round, and reactive to light.  Neck: Normal range of motion. Neck supple.  Cardiovascular: Normal rate and regular rhythm.   Pulmonary/Chest: Effort normal and breath sounds normal.  Neurological: Pt is alert. No cranial nerve defecit, motor/gait intact Skin: Skin is warm. No erythema. except for right 2nd toe with 1-2+ red/tender/swelling Psychiatric: Pt behavior is normal. Thought content normal. 2+ nervous    Assessment & Plan:

## 2012-04-16 NOTE — Assessment & Plan Note (Signed)
Mild to mod, for antibx course,  to f/u any worsening symptoms or concerns 

## 2012-04-16 NOTE — Assessment & Plan Note (Signed)
?   Cellulitis vs trauma vs other, for film today, for antibx course,  to f/u any worsening symptoms or concerns

## 2012-04-16 NOTE — Assessment & Plan Note (Signed)
stable overall by hx and exam, most recent data reviewed with pt, and pt to continue medical treatment as before Lab Results  Component Value Date   WBC 7.9 01/22/2012   HGB 13.6 01/22/2012   HCT 40.6 01/22/2012   PLT 275.0 01/22/2012   GLUCOSE 94 01/22/2012   CHOL 237* 01/22/2012   TRIG 193.0* 01/22/2012   HDL 49.60 01/22/2012   LDLDIRECT 163.0 01/22/2012   ALT 14 01/22/2012   AST 19 01/22/2012   NA 142 01/22/2012   K 4.2 01/22/2012   CL 106 01/22/2012   CREATININE 0.8 01/22/2012   BUN 13 01/22/2012   CO2 27 01/22/2012   TSH 1.39 01/22/2012   With some situational anxiety, declines need for change in tx

## 2012-07-29 ENCOUNTER — Other Ambulatory Visit (HOSPITAL_COMMUNITY): Payer: Self-pay | Admitting: Obstetrics and Gynecology

## 2012-07-29 DIAGNOSIS — Z1231 Encounter for screening mammogram for malignant neoplasm of breast: Secondary | ICD-10-CM

## 2012-08-29 ENCOUNTER — Ambulatory Visit (HOSPITAL_COMMUNITY): Payer: BC Managed Care – PPO

## 2012-09-16 ENCOUNTER — Ambulatory Visit (HOSPITAL_COMMUNITY)
Admission: RE | Admit: 2012-09-16 | Discharge: 2012-09-16 | Disposition: A | Discharge status: Home / Self Care | Payer: BC Managed Care – PPO | Source: Ambulatory Visit | Attending: Obstetrics and Gynecology | Admitting: Obstetrics and Gynecology

## 2012-09-16 DIAGNOSIS — Z1231 Encounter for screening mammogram for malignant neoplasm of breast: Secondary | ICD-10-CM | POA: Insufficient documentation

## 2012-09-27 ENCOUNTER — Emergency Department (HOSPITAL_COMMUNITY): Payer: BC Managed Care – PPO

## 2012-09-27 ENCOUNTER — Encounter (HOSPITAL_COMMUNITY): Payer: Self-pay | Admitting: *Deleted

## 2012-09-27 ENCOUNTER — Emergency Department (HOSPITAL_COMMUNITY)
Admission: EM | Admit: 2012-09-27 | Discharge: 2012-09-27 | Disposition: A | Discharge status: Home / Self Care | Payer: BC Managed Care – PPO | Attending: Dermatology | Admitting: Dermatology

## 2012-09-27 DIAGNOSIS — K5289 Other specified noninfective gastroenteritis and colitis: Secondary | ICD-10-CM | POA: Insufficient documentation

## 2012-09-27 DIAGNOSIS — E785 Hyperlipidemia, unspecified: Secondary | ICD-10-CM | POA: Insufficient documentation

## 2012-09-27 DIAGNOSIS — Z8639 Personal history of other endocrine, nutritional and metabolic disease: Secondary | ICD-10-CM | POA: Insufficient documentation

## 2012-09-27 DIAGNOSIS — Z8659 Personal history of other mental and behavioral disorders: Secondary | ICD-10-CM | POA: Insufficient documentation

## 2012-09-27 DIAGNOSIS — K5900 Constipation, unspecified: Secondary | ICD-10-CM | POA: Insufficient documentation

## 2012-09-27 DIAGNOSIS — K529 Noninfective gastroenteritis and colitis, unspecified: Secondary | ICD-10-CM

## 2012-09-27 DIAGNOSIS — N390 Urinary tract infection, site not specified: Secondary | ICD-10-CM | POA: Insufficient documentation

## 2012-09-27 LAB — URINALYSIS, ROUTINE W REFLEX MICROSCOPIC
Bilirubin Urine: NEGATIVE
Glucose, UA: NEGATIVE mg/dL
Hgb urine dipstick: NEGATIVE
Ketones, ur: NEGATIVE mg/dL
Nitrite: POSITIVE — AB
Protein, ur: NEGATIVE mg/dL
Specific Gravity, Urine: 1.019 (ref 1.005–1.030)
Urobilinogen, UA: 0.2 mg/dL (ref 0.0–1.0)
pH: 8 (ref 5.0–8.0)

## 2012-09-27 LAB — URINE MICROSCOPIC-ADD ON

## 2012-09-27 LAB — BASIC METABOLIC PANEL
BUN: 10 mg/dL (ref 6–23)
CO2: 28 mEq/L (ref 19–32)
Calcium: 9.8 mg/dL (ref 8.4–10.5)
Chloride: 102 mEq/L (ref 96–112)
Creatinine, Ser: 0.87 mg/dL (ref 0.50–1.10)
GFR calc Af Amer: 83 mL/min — ABNORMAL LOW (ref >90)
GFR calc non Af Amer: 72 mL/min — ABNORMAL LOW (ref >90)
Glucose, Bld: 86 mg/dL (ref 70–99)
Potassium: 4 mEq/L (ref 3.5–5.1)
Sodium: 142 mEq/L (ref 135–145)

## 2012-09-27 LAB — CBC WITH DIFFERENTIAL/PLATELET
Basophils Absolute: 0 10*3/uL (ref 0.0–0.1)
Basophils Relative: 0 % (ref 0–1)
Eosinophils Absolute: 0.2 10*3/uL (ref 0.0–0.7)
Eosinophils Relative: 3 % (ref 0–5)
HCT: 41 % (ref 36.0–46.0)
Hemoglobin: 13.4 g/dL (ref 12.0–15.0)
Lymphocytes Relative: 38 % (ref 12–46)
Lymphs Abs: 2.8 10*3/uL (ref 0.7–4.0)
MCH: 30.2 pg (ref 26.0–34.0)
MCHC: 32.7 g/dL (ref 30.0–36.0)
MCV: 92.6 fL (ref 78.0–100.0)
Monocytes Absolute: 0.6 10*3/uL (ref 0.1–1.0)
Monocytes Relative: 8 % (ref 3–12)
Neutro Abs: 3.8 10*3/uL (ref 1.7–7.7)
Neutrophils Relative %: 52 % (ref 43–77)
Platelets: 294 10*3/uL (ref 150–400)
RBC: 4.43 MIL/uL (ref 3.87–5.11)
RDW: 13.5 % (ref 11.5–15.5)
WBC: 7.4 10*3/uL (ref 4.0–10.5)

## 2012-09-27 IMAGING — CR DG ABDOMEN ACUTE W/ 1V CHEST
3 series · 3 of 3 positions shown · non-contrast
Comparison: Abdominal CT of [DATE].Plain film chest of
[DATE]

CLINICAL DATA: Abdominal pain and constipation for 4 days.

ACUTE ABDOMEN SERIES (ABDOMEN 2 VIEW & CHEST 1 VIEW)

[w chest pa]
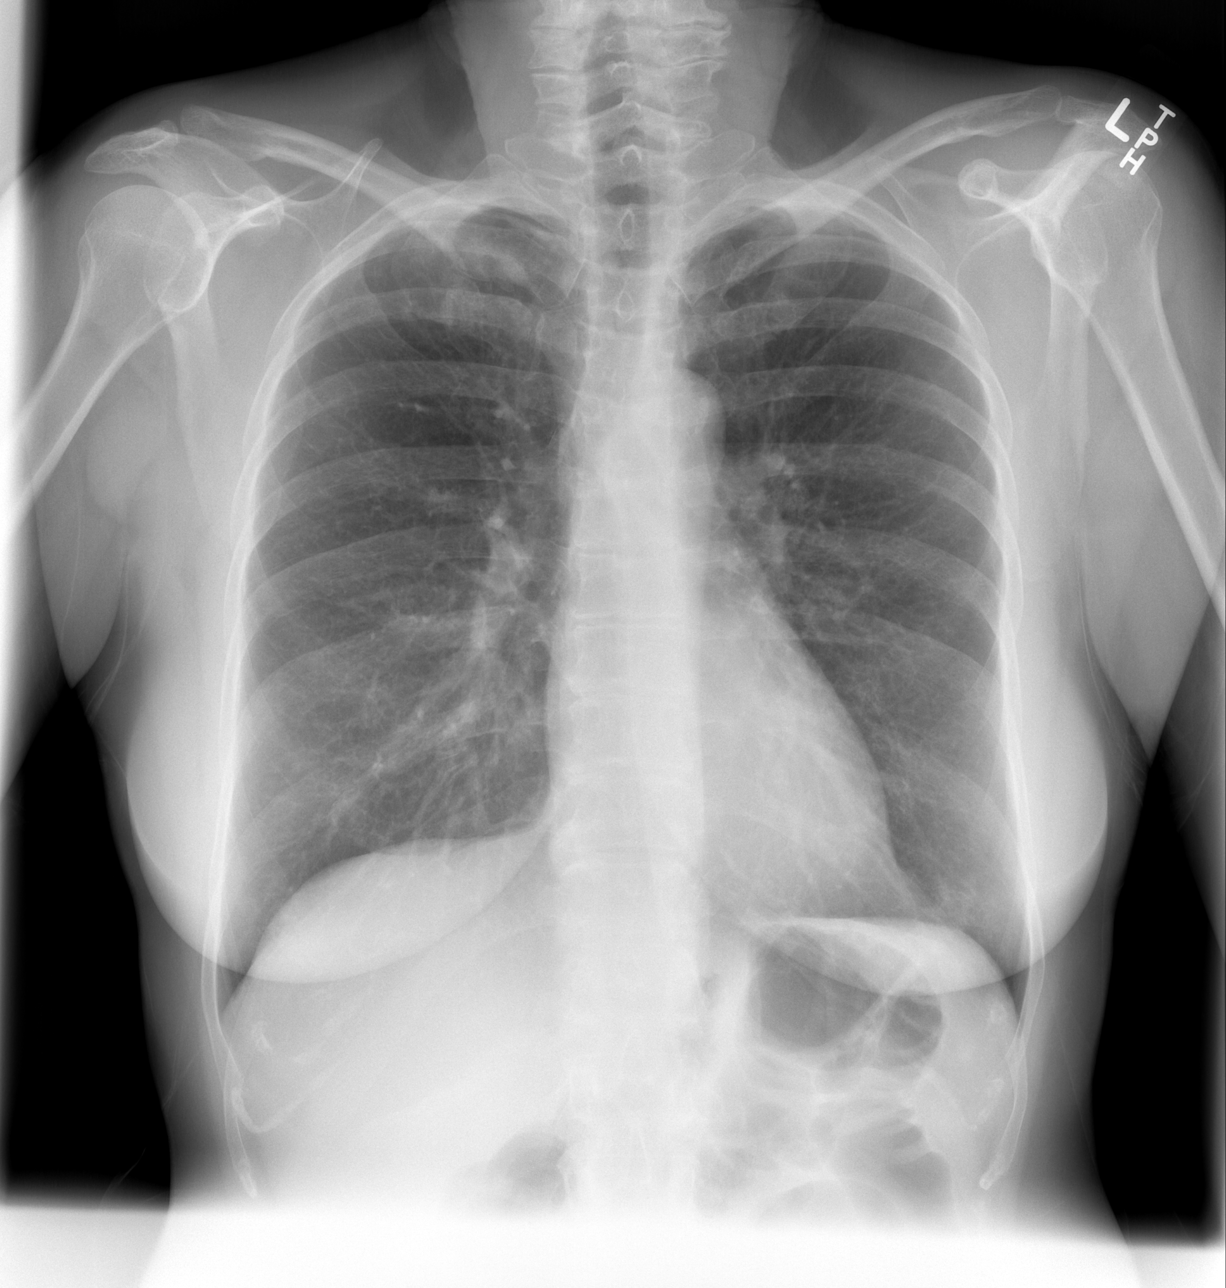

[w abdomen upright]
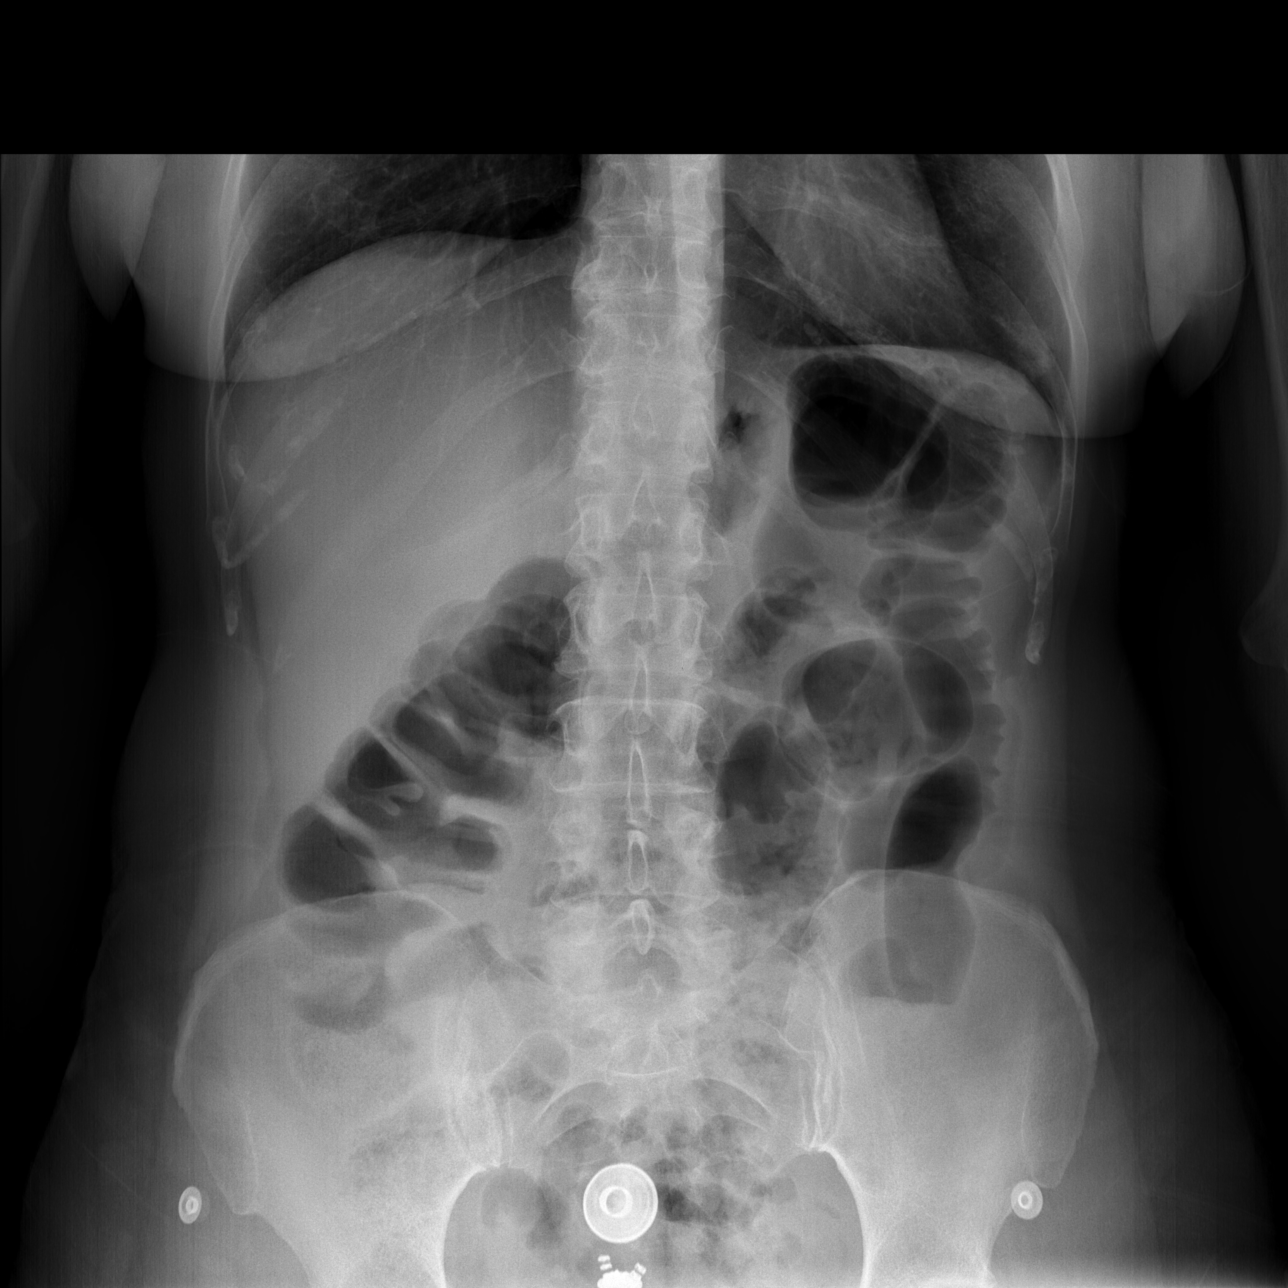

[t abdomen supine]
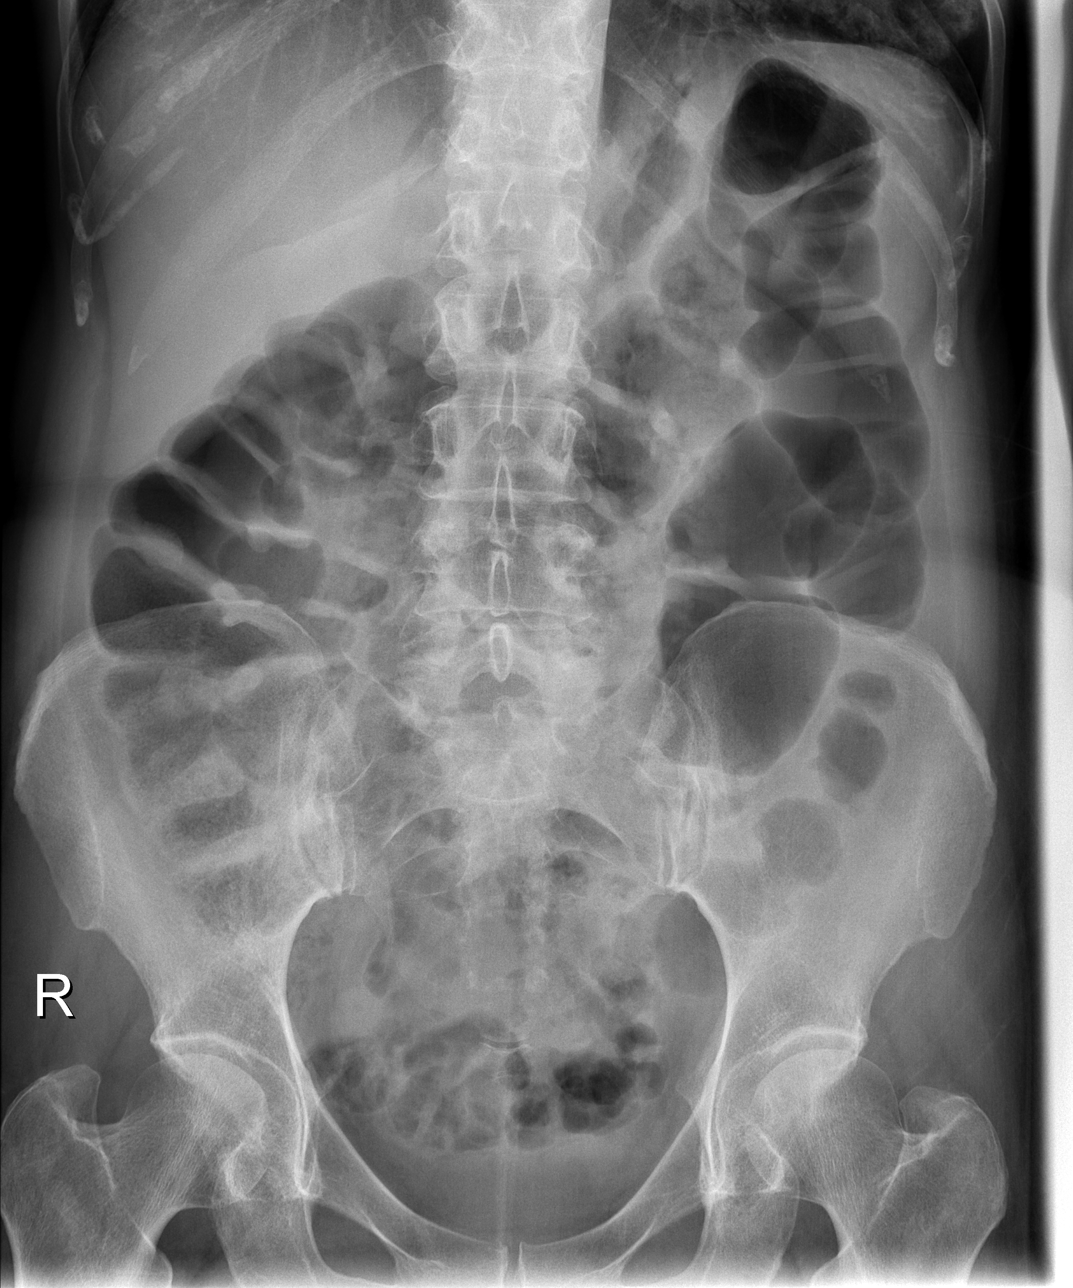

[3 of 3 positions shown; findings below may reference images not displayed]

FINDINGS: Frontal view of the chest demonstrates midline trachea.
Normal heart size and mediastinal contours. No pleural effusion or
pneumothorax.  Increased density projecting over the right apex is
favored to be related to prominent anterior 1st right rib and
medial clavicle, similar to [CS]. Diffuse peribronchial thickening.
No lobar consolidation.

Abominal films demonstrate no free intraperitoneal air on upright
positioning.  Minimal air fluid levels suspected within the left
sided small bowel loops.

Gas filled colon, normal in caliber.  No significant small bowel
dilatation on supine imaging. Distal gas and stool.
IMPRESSION: Minimal small bowel air fluid levels, without small bowel
dilatation.  Question mild gastroenteritis.

No obstruction or free intraperitoneal air.

Mild interstitial thickening.  The clinical history describes the
patient as a nonsmoker.  Therefore, this could represent asthma or
chronic bronchitis.

## 2012-09-27 IMAGING — CT CT ABD-PELV W/ CM
2 of 5 series · 14 of 32 positions shown, 19 images · IV contrast (omnipaque)
Comparison: [DATE]

CLINICAL DATA: Left abdominal pain

CT ABDOMEN AND PELVIS WITH CONTRAST
TECHNIQUE: Multidetector CT imaging of the abdomen and pelvis was
performed following the standard protocol during bolus
administration of intravenous contrast.
Contrast: 100mL OMNIPAQUE IOHEXOL 300 MG/ML  SOLN

[Series 2: routine abdomen · axial · 0.70mm/px · z∈[-315,-30]mm · 7 of 77 slices shown, 12 images]
[im 10/77  soft-tissue]
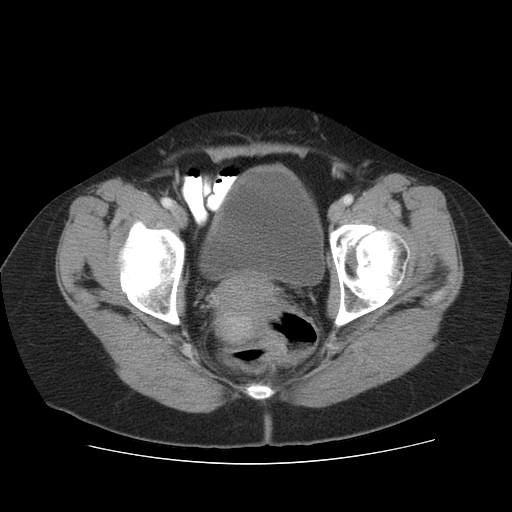
[im 10/77  bone]
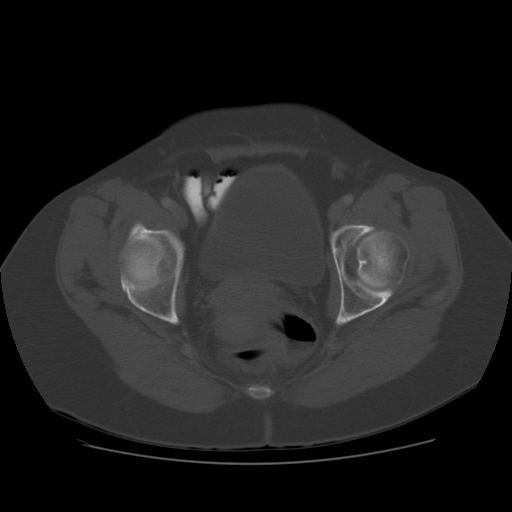
[im 20/77  soft-tissue]
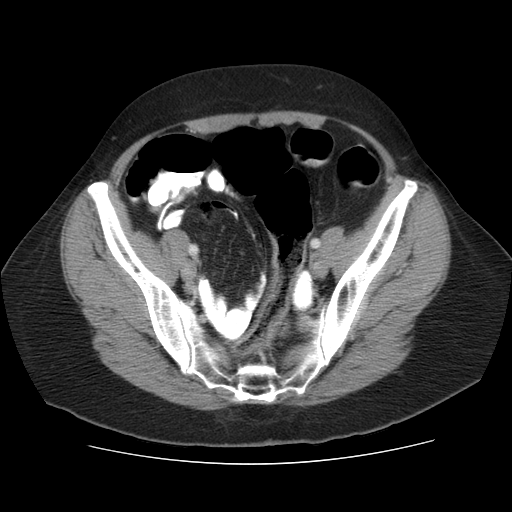
[im 29/77  soft-tissue]
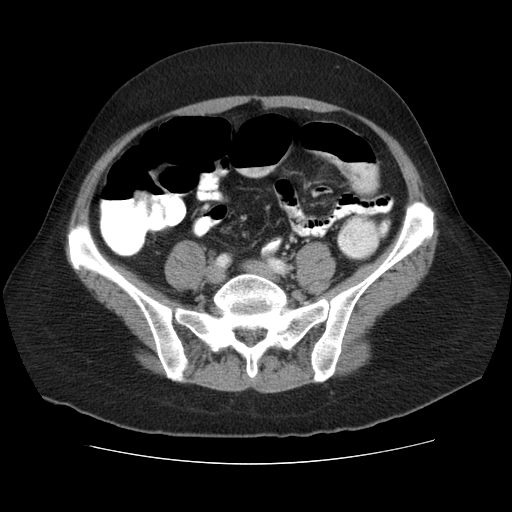
[im 39/77  soft-tissue]
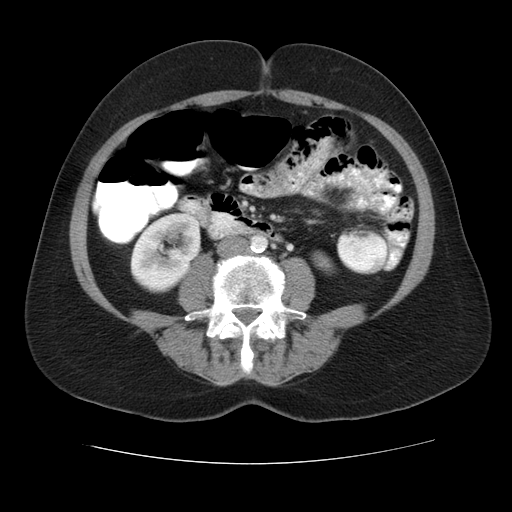
[im 39/77  lung]
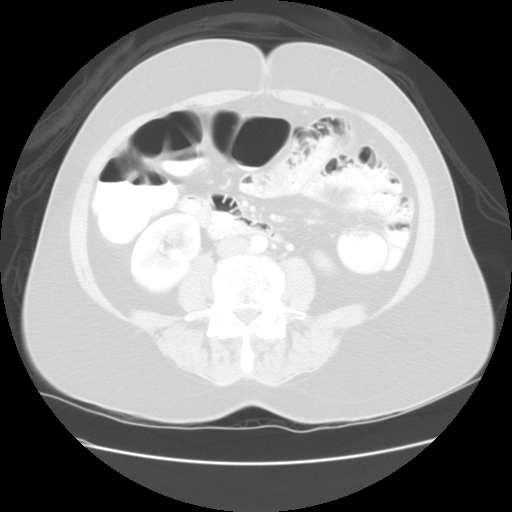
[im 48/77  soft-tissue]
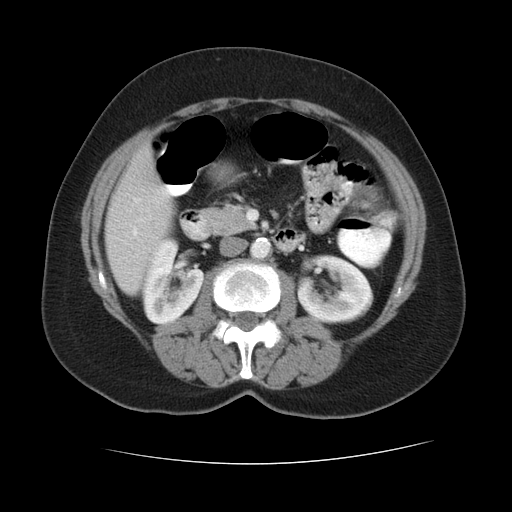
[im 48/77  lung]
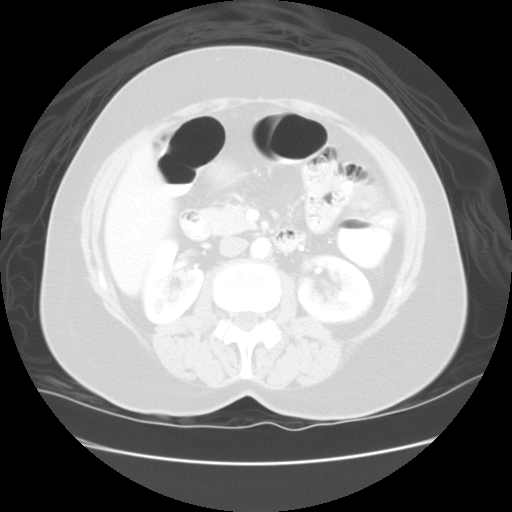
[im 58/77  soft-tissue]
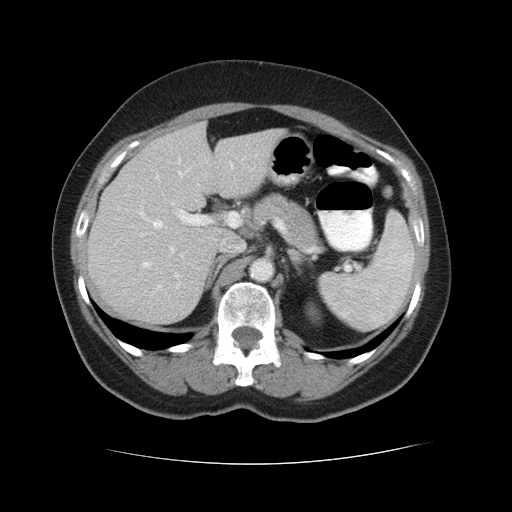
[im 58/77  lung]
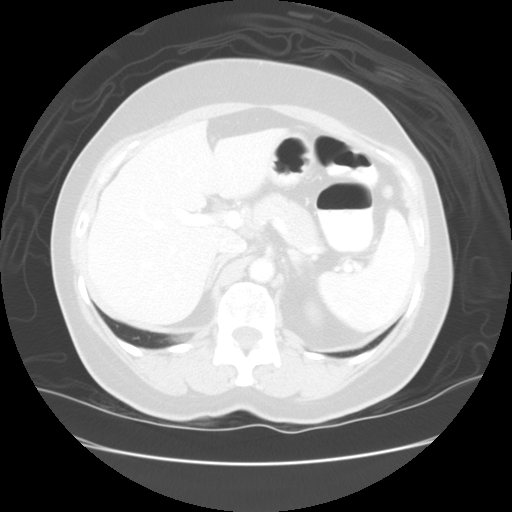
[im 67/77  soft-tissue]
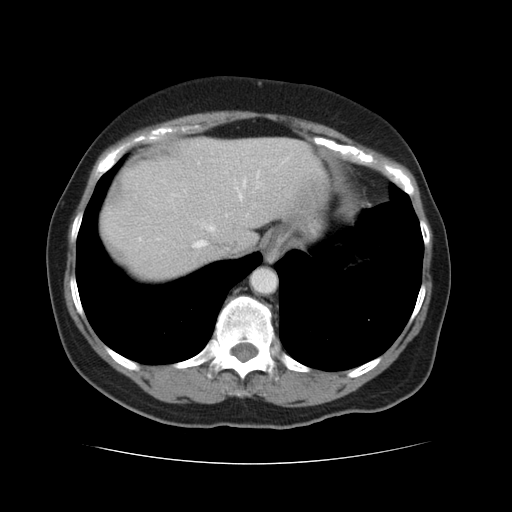
[im 67/77  lung]
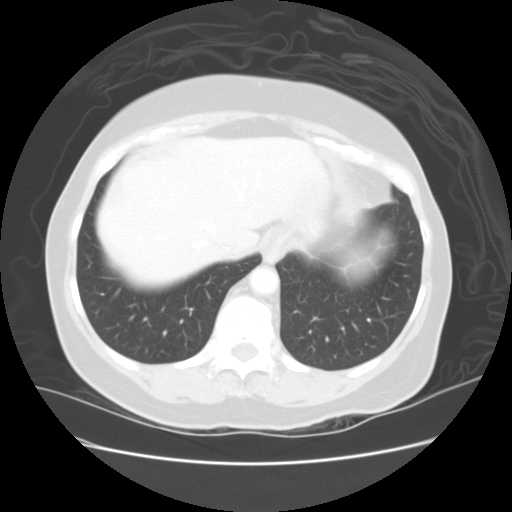

[Series 401: sag · sagittal · 0.87mm/px · 7 of 95 slices shown]
[im 11/95  soft-tissue]
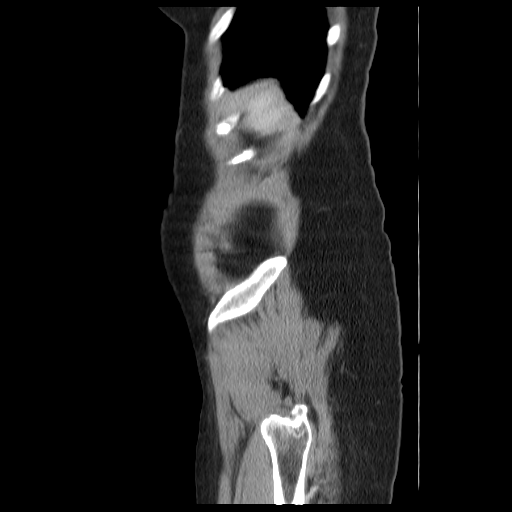
[im 21/95  soft-tissue]
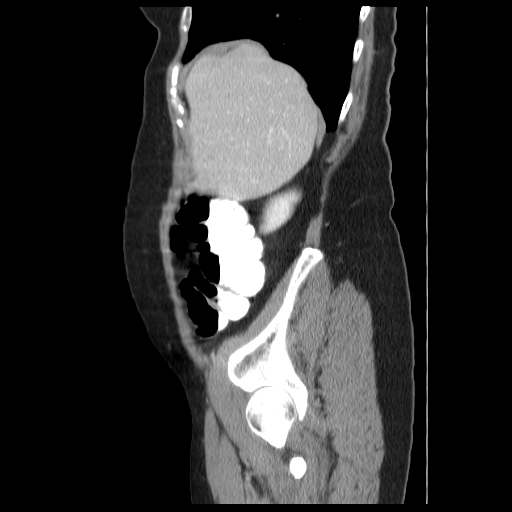
[im 32/95  soft-tissue]
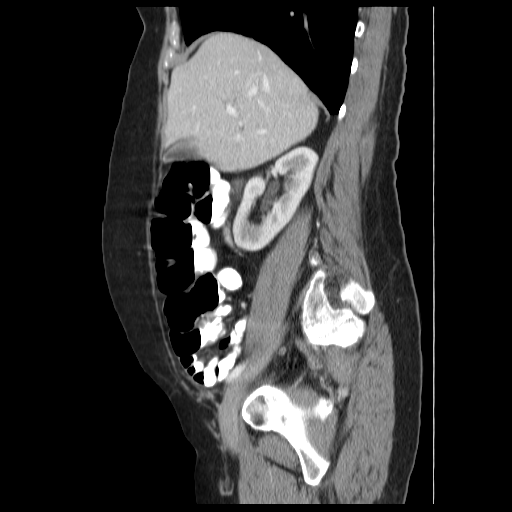
[im 42/95  soft-tissue]
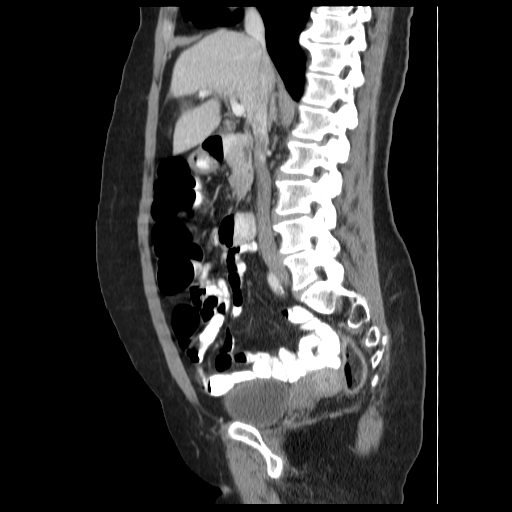
[im 53/95  soft-tissue]
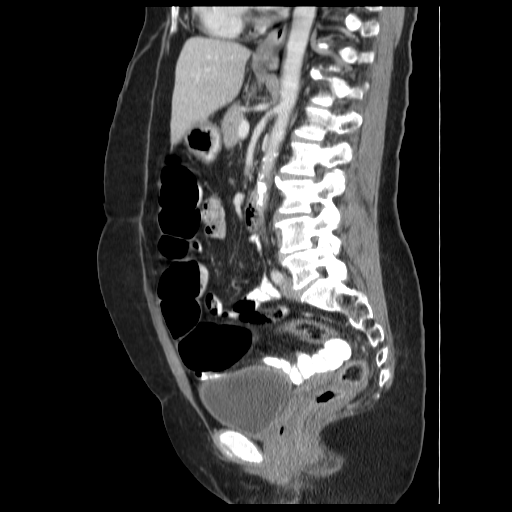
[im 63/95  soft-tissue]
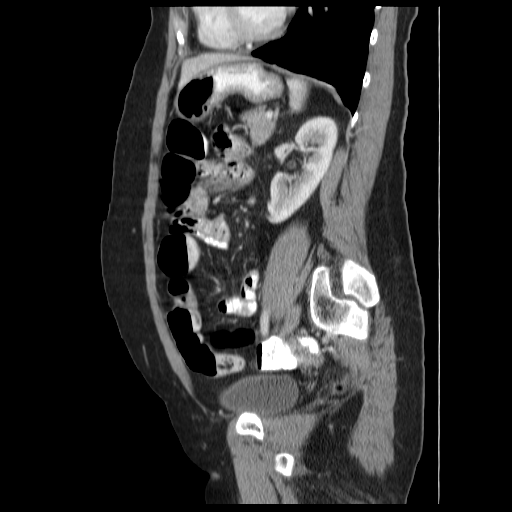
[im 74/95  soft-tissue]
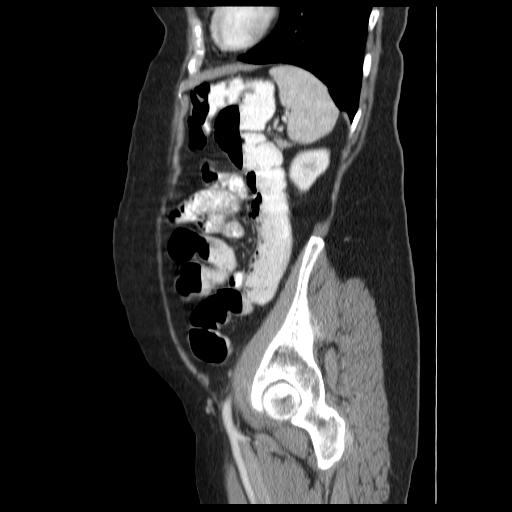

[14 of 32 positions shown; findings below may reference images not displayed]

FINDINGS: No focal abnormalities seen in the liver or spleen.  The
stomach, duodenum, pancreas, gallbladder, and adrenal glands are
normal.  Kidneys have normal imaging features.

No abdominal aortic aneurysm.  No free fluid or lymphadenopathy in
the abdomen.  Scattered small lymph nodes in the small bowel
mesentery are stable.  The

Imaging through the pelvis shows no free intraperitoneal fluid.
There is no pelvic sidewall lymphadenopathy.  The bladder is
normal.  Uterus is unremarkable.  No adnexal mass.

The wall of the distal rectum shows circumferential thickening and
subtle perirectal edema/inflammation.  No colonic diverticulitis.
The terminal ileum is normal.  The appendix is normal.

Bone windows reveal no worrisome lytic or sclerotic osseous
lesions.
IMPRESSION: There appears to be some mild circumferential wall thickening in
the distal rectum with a subtle associated perirectal
edema/inflammation.  Infection or inflammation in the rectum would
be a consideration.

## 2012-09-27 MED ORDER — CEPHALEXIN 500 MG PO CAPS: 500.0000 mg | ORAL_CAPSULE | Freq: Two times a day (BID) | ORAL | Status: DC

## 2012-09-27 MED ORDER — IOHEXOL 300 MG/ML  SOLN
20.0000 mL | INTRAMUSCULAR | Status: AC
  Administered 2012-09-27 (×2): 20 mL via ORAL

## 2012-09-27 MED ORDER — OXYCODONE-ACETAMINOPHEN 5-325 MG PO TABS: ORAL_TABLET | ORAL | Status: DC

## 2012-09-27 MED ORDER — AMOXICILLIN-POT CLAVULANATE 875-125 MG PO TABS: 1.0000 | ORAL_TABLET | Freq: Two times a day (BID) | ORAL | Status: DC

## 2012-09-27 MED ORDER — IOHEXOL 300 MG/ML  SOLN
100.0000 mL | Freq: Once | INTRAMUSCULAR | Status: AC | PRN
  Administered 2012-09-27: 100 mL via INTRAVENOUS

## 2012-09-27 NOTE — ED Notes (Signed)
Patient became very upset and crying when CT tech arrived with contrast. She states her husband does not want her to have the CT done because he had a family member die from one. She asked if we could keep him from knowing and keep him out of her room. Patient agreed to drink the contrast and have the CT because it is her choice to make not his.

## 2012-09-27 NOTE — ED Notes (Signed)
PT here with constipated and reports LBM was 4 days ago.  NOt vomiting, but has nausea.  Hurts throughout abdomen.  No urinary s/s or vaginal discharge

## 2012-09-27 NOTE — ED Notes (Signed)
PT states not passing gas

## 2012-09-27 NOTE — ED Provider Notes (Signed)
Signout from Dr. Denton Lank at shift change. Pt with 4 days of constipation and left lower quadrant pain. Plan is to obtain CT abdomen and pelvis to rule out diverticulitis.  Urinalysis shows nitrites and leukocytes with many bacteria. Will treat her for UTI.  CT abdomen pelvis shows a mild circumferential wall thickening in the distal rectum with mild associated perirectal edema and inflammation. Patient has not used any suppositories that might physically irritate the rectum. Results discussed with Dr. Oletta Lamas. We will treat her as an infectious colitis with Augmentin.   Explained to patient that we cannot be sure that the cause of this inflammation is infectious in nature. I have given explicit return precautions and also asked her to follow with her primary care physician after completing the antibiotics for a recheck. Patient and her husband verbalized understanding.   Pt verbalized understanding and agrees with care plan. Outpatient follow-up and return precautions given.    New Prescriptions   AMOXICILLIN-CLAVULANATE (AUGMENTIN) 875-125 MG PER TABLET    Take 1 tablet by mouth every 12 (twelve) hours.   CEPHALEXIN (KEFLEX) 500 MG CAPSULE    Take 1 capsule (500 mg total) by mouth 2 (two) times daily.   OXYCODONE-ACETAMINOPHEN (PERCOCET/ROXICET) 5-325 MG PER TABLET    1 to 2 tabs PO q6hrs  PRN for pain    Wynetta Emery, PA-C 09/28/12 2041

## 2012-09-27 NOTE — ED Provider Notes (Signed)
History   This chart was scribed for Suzi Roots, MD by Charolett Bumpers, ED Scribe. The patient was seen in room TR07C/TR07C. Patient's care was started at 57.   CSN: [id number]  Arrival date & time 09/27/12  1644   First MD Initiated Contact with Patient 09/27/12 1908      Chief Complaint  Patient presents with  . Abdominal Pain    The history is provided by the patient. No language interpreter was used.   HEAVYN YEARSLEY is a 61 y.o. female who presents to the Emergency Department complaining of constant constipation for the past 4 days. C/o left sided abd pain, constant, dull, non radiating. She reports associated LLQ abdominal pain. Last BM was 4 days ago. She states that her stools were normal prior to onset of symptoms. She denies any h/o constipation and normally has a BM daily. She denies any fevers, vomiting, difficulty urinating, dysuria. She has a h/o c-section, no other abdominal surgeries. She has not had a colonoscopy in the past. She denies any h/o diverticulitis or diverticulosis.  No vaginal discharge or bleeding. No gu c/o.      Past Medical History  Diagnosis Date  . VITAMIN D DEFICIENCY 07/09/2010    Qualifier: Diagnosis of  By: Jonny Ruiz MD, Len Blalock   . Depression 01/22/2012  . Hyperlipidemia 01/24/2012    Past Surgical History  Procedure Date  . Ceaserian section     Family History  Problem Relation Age of Onset  . Heart disease Mother     History  Substance Use Topics  . Smoking status: Never Smoker   . Smokeless tobacco: Never Used  . Alcohol Use: No    OB History    Grav Para Term Preterm Abortions TAB SAB Ect Mult Living                  Review of Systems  Constitutional: Negative for fever.  Gastrointestinal: Positive for abdominal pain and constipation. Negative for vomiting.  Genitourinary: Negative for dysuria and difficulty urinating.  All other systems reviewed and are negative.    Allergies  Review of patient's  allergies indicates no known allergies.  Home Medications   Current Outpatient Rx  Name  Route  Sig  Dispense  Refill  . IBUPROFEN 200 MG PO TABS   Oral   Take 400 mg by mouth every 6 (six) hours as needed. For headache           BP 159/94  Pulse 74  Temp 97.6 F (36.4 C) (Oral)  Resp 24  SpO2 100%  Physical Exam  Nursing note and vitals reviewed. Constitutional: She is oriented to person, place, and time. She appears well-developed and well-nourished. No distress.  HENT:  Nose: Nose normal.  Mouth/Throat: Oropharynx is clear and moist.  Eyes: Conjunctivae normal are normal. No scleral icterus.  Neck: Neck supple. No tracheal deviation present.  Cardiovascular: Normal rate, regular rhythm, normal heart sounds and intact distal pulses.   Pulmonary/Chest: Effort normal and breath sounds normal. No respiratory distress.  Abdominal: Soft. Normal appearance. She exhibits no distension and no mass. There is tenderness. There is no rebound and no guarding.       LLQ abdominal tenderness. No incarc hernia  Genitourinary:       No cva tenderness. Small amount soft light brown stool on rectal exam, no impaction.   Musculoskeletal: Normal range of motion. She exhibits no edema and no tenderness.  Neurological: She is alert and  oriented to person, place, and time. No cranial nerve deficit.  Skin: Skin is warm and dry. No rash noted.  Psychiatric: She has a normal mood and affect. Her behavior is normal.    ED Course  Procedures (including critical care time)  DIAGNOSTIC STUDIES: Oxygen Saturation is 100% on room air, normal by my interpretation.    COORDINATION OF CARE:  19:12-Discussed planned course of treatment with the patient including a CT of her abdomen, who is agreeable at this time.    Results for orders placed during the hospital encounter of 09/27/12  CBC WITH DIFFERENTIAL      Component Value Range   WBC 7.4  4.0 - 10.5 K/uL   RBC 4.43  3.87 - 5.11 MIL/uL    Hemoglobin 13.4  12.0 - 15.0 g/dL   HCT 16.1  09.6 - 04.5 %   MCV 92.6  78.0 - 100.0 fL   MCH 30.2  26.0 - 34.0 pg   MCHC 32.7  30.0 - 36.0 g/dL   RDW 40.9  81.1 - 91.4 %   Platelets 294  150 - 400 K/uL   Neutrophils Relative 52  43 - 77 %   Neutro Abs 3.8  1.7 - 7.7 K/uL   Lymphocytes Relative 38  12 - 46 %   Lymphs Abs 2.8  0.7 - 4.0 K/uL   Monocytes Relative 8  3 - 12 %   Monocytes Absolute 0.6  0.1 - 1.0 K/uL   Eosinophils Relative 3  0 - 5 %   Eosinophils Absolute 0.2  0.0 - 0.7 K/uL   Basophils Relative 0  0 - 1 %   Basophils Absolute 0.0  0.0 - 0.1 K/uL  BASIC METABOLIC PANEL      Component Value Range   Sodium 142  135 - 145 mEq/L   Potassium 4.0  3.5 - 5.1 mEq/L   Chloride 102  96 - 112 mEq/L   CO2 28  19 - 32 mEq/L   Glucose, Bld 86  70 - 99 mg/dL   BUN 10  6 - 23 mg/dL   Creatinine, Ser 7.82  0.50 - 1.10 mg/dL   Calcium 9.8  8.4 - 95.6 mg/dL   GFR calc non Af Amer 72 (*) >90 mL/min   GFR calc Af Amer 83 (*) >90 mL/min    Dg Abd Acute W/chest  09/27/2012  *RADIOLOGY REPORT*  Clinical Data: Abdominal pain and constipation for 4 days.  ACUTE ABDOMEN SERIES (ABDOMEN 2 VIEW & CHEST 1 VIEW)  Comparison: Abdominal CT of 12/10/2009.Plain film chest of 06/10/2006  Findings: Frontal view of the chest demonstrates midline trachea. Normal heart size and mediastinal contours. No pleural effusion or pneumothorax.  Increased density projecting over the right apex is favored to be related to prominent anterior 1st right rib and medial clavicle, similar to 2007. Diffuse peribronchial thickening. No lobar consolidation.  Abominal films demonstrate no free intraperitoneal air on upright positioning.  Minimal air fluid levels suspected within the left sided small bowel loops.  Gas filled colon, normal in caliber.  No significant small bowel dilatation on supine imaging. Distal gas and stool.  IMPRESSION: Minimal small bowel air fluid levels, without small bowel dilatation.  Question mild  gastroenteritis.  No obstruction or free intraperitoneal air.  Mild interstitial thickening.  The clinical history describes the patient as a nonsmoker.  Therefore, this could represent asthma or chronic bronchitis.   Original Report Authenticated By: Jeronimo Greaves, M.D.  MDM  I personally performed the services described in this documentation, which was scribed in my presence. The recorded information has been reviewed and is accurate.   llq tenderness. Will get ct.   Reviewed nursing notes and prior charts for additional history.   Signed out to PA Pisciotta/Dr Ghim, to follow up on ct abd/pelvis when back, and UA, recheck pt, and dispo appropriately.    Suzi Roots, MD 09/27/12 [phone]

## 2013-04-05 ENCOUNTER — Ambulatory Visit (INDEPENDENT_AMBULATORY_CARE_PROVIDER_SITE_OTHER): Payer: BC Managed Care – PPO | Admitting: Internal Medicine

## 2013-04-05 ENCOUNTER — Encounter: Payer: Self-pay | Admitting: Internal Medicine

## 2013-04-05 VITALS — BP 130/68 | HR 68 | Temp 97.6°F | Wt 148.5 lb

## 2013-04-05 DIAGNOSIS — Z0000 Encounter for general adult medical examination without abnormal findings: Secondary | ICD-10-CM

## 2013-04-05 DIAGNOSIS — L732 Hidradenitis suppurativa: Secondary | ICD-10-CM

## 2013-04-05 DIAGNOSIS — Z23 Encounter for immunization: Secondary | ICD-10-CM

## 2013-04-05 DIAGNOSIS — Z2911 Encounter for prophylactic immunotherapy for respiratory syncytial virus (RSV): Secondary | ICD-10-CM

## 2013-04-05 MED ORDER — ASPIRIN 81 MG PO TBEC: 81.0000 mg | DELAYED_RELEASE_TABLET | Freq: Every day | ORAL | Status: DC

## 2013-04-05 MED ORDER — DOXYCYCLINE HYCLATE 100 MG PO TABS: 100.0000 mg | ORAL_TABLET | Freq: Two times a day (BID) | ORAL | Status: DC

## 2013-04-05 NOTE — Progress Notes (Signed)
Subjective:    Patient ID: Amber Hicks, female    DOB: 02/25/1951, 62 y.o.   MRN: [id number]  HPI  Here for wellness and f/u;  Overall doing ok;  Pt denies CP, worsening SOB, DOE, wheezing, orthopnea, PND, worsening LE edema, palpitations, dizziness or syncope.  Pt denies neurological change such as new headache, facial or extremity weakness.  Pt denies polydipsia, polyuria, or low sugar symptoms. Pt states overall good compliance with treatment and medications, good tolerability, and has been trying to follow lower cholesterol diet.  Pt denies worsening depressive symptoms, suicidal ideation or panic. No fever, night sweats, wt loss, loss of appetite, or other constitutional symptoms.  Pt states good ability with ADL's, has low fall risk, home safety reviewed and adequate, no other significant changes in hearing or vision, and only occasionally active with exercise.  Does have mild tender area left axilla with red, swelling but no drainage.  No fever, red streaks or right axillary or other skin change.  Due for shingles shot/colonoscpy Past Medical History  Diagnosis Date  . VITAMIN D DEFICIENCY 07/09/2010    Qualifier: Diagnosis of  By: Jonny Ruiz MD, Len Blalock   . Depression 01/22/2012  . Hyperlipidemia 01/24/2012   Past Surgical History  Procedure Laterality Date  . Ceaserian section      reports that she has never smoked. She has never used smokeless tobacco. She reports that she does not drink alcohol or use illicit drugs. family history includes Heart disease in her mother. No Known Allergies No current outpatient prescriptions on file prior to visit.   Current Facility-Administered Medications on File Prior to Visit  Medication Dose Route Frequency Provider Last Rate Last Dose  . TDaP (BOOSTRIX) injection 0.5 mL  0.5 mL Intramuscular Once Corwin Levins, MD       Review of Systems Constitutional: Negative for diaphoresis, activity change, appetite change or unexpected weight change.   HENT: Negative for hearing loss, ear pain, facial swelling, mouth sores and neck stiffness.   Eyes: Negative for pain, redness and visual disturbance.  Respiratory: Negative for shortness of breath and wheezing.   Cardiovascular: Negative for chest pain and palpitations.  Gastrointestinal: Negative for diarrhea, blood in stool, abdominal distention or other pain Genitourinary: Negative for hematuria, flank pain or change in urine volume.  Musculoskeletal: Negative for myalgias and joint swelling.  Skin: Negative for color change and wound.  Neurological: Negative for syncope and numbness. other than noted Hematological: Negative for adenopathy.  Psychiatric/Behavioral: Negative for hallucinations, self-injury, decreased concentration and agitation.      Objective:   Physical Exam BP 130/68  Pulse 68  Temp(Src) 97.6 F (36.4 C) (Oral)  Wt 148 lb 8 oz (67.359 kg)  BMI 23.25 kg/m2  SpO2 98% VS noted,  Constitutional: Pt is oriented to person, place, and time. Appears well-developed and well-nourished.  Head: Normocephalic and atraumatic.  Right Ear: External ear normal.  Left Ear: External ear normal.  Nose: Nose normal.  Mouth/Throat: Oropharynx is clear and moist.  Eyes: Conjunctivae and EOM are normal. Pupils are equal, round, and reactive to light.  Neck: Normal range of motion. Neck supple. No JVD present. No tracheal deviation present.  Cardiovascular: Normal rate, regular rhythm, normal heart sounds and intact distal pulses.   Pulmonary/Chest: Effort normal and breath sounds normal.  Abdominal: Soft. Bowel sounds are normal. There is no tenderness. No HSM  Musculoskeletal: Normal range of motion. Exhibits no edema.  Lymphadenopathy:  Has no cervical adenopathy.  Neurological:  Pt is alert and oriented to person, place, and time. Pt has normal reflexes. No cranial nerve deficit.  Skin: Skin is warm and dry. No rash noted. except for left axilla with 1 cm area mild  red/tender/swelling Psychiatric:  Has  labile mood and affect. Behavior is otherwise normal.     Assessment & Plan:

## 2013-04-05 NOTE — Addendum Note (Signed)
Addended by: Scharlene Gloss B on: 04/05/2013 10:56 AM   Modules accepted: Orders

## 2013-04-05 NOTE — Assessment & Plan Note (Signed)
Mild left axilla - Mild to mod, for antibx course,  to f/u any worsening symptoms or concerns

## 2013-04-05 NOTE — Assessment & Plan Note (Signed)

## 2013-04-05 NOTE — Patient Instructions (Addendum)
You had the shingles shot today Please take all new medication as prescribed - the antibiotic Since you are now 62yo, please start Aspirin 81 mg - 1 per day - Coated only from the drug store, to reduce risk of heart problems or stroke Please continue all other medications as before Please go to the LAB in the Basement (turn left off the elevator) for the tests to be done today You will be contacted by phone if any changes need to be made immediately.  Otherwise, you will receive a letter about your results with an explanation, but please check with MyChart first.  You will be contacted regarding the referral for: colonoscopy  Please remember to sign up for My Chart if you have not done so, as this will be important to you in the future with finding out test results, communicating by private email, and scheduling acute appointments online when needed.  Please return in 1 year for your yearly visit, or sooner if needed

## 2013-04-11 ENCOUNTER — Telehealth: Payer: Self-pay

## 2013-04-11 ENCOUNTER — Encounter: Payer: Self-pay | Admitting: Internal Medicine

## 2013-04-11 NOTE — Telephone Encounter (Signed)
The patient stated both arms are broken out with a rash.  She was given a shingles vaccine at her last OV and wondered if this could be the cause? The rash is red, pink and itches (both arms).  States water makes it hurt worse.  Please advise on what to do? Call back number [phone]

## 2013-04-11 NOTE — Telephone Encounter (Signed)
Very unlikely, consider OV with [facility]

## 2013-04-11 NOTE — Telephone Encounter (Signed)
Patient informed and agreed to schedule with [facility]

## 2013-04-12 ENCOUNTER — Ambulatory Visit: Payer: BC Managed Care – PPO | Admitting: Internal Medicine

## 2013-04-12 ENCOUNTER — Encounter: Payer: Self-pay | Admitting: Internal Medicine

## 2013-04-12 ENCOUNTER — Ambulatory Visit (INDEPENDENT_AMBULATORY_CARE_PROVIDER_SITE_OTHER): Payer: BC Managed Care – PPO | Admitting: Internal Medicine

## 2013-04-12 VITALS — BP 108/72 | HR 75 | Temp 97.8°F

## 2013-04-12 DIAGNOSIS — L259 Unspecified contact dermatitis, unspecified cause: Secondary | ICD-10-CM

## 2013-04-12 DIAGNOSIS — T50905A Adverse effect of unspecified drugs, medicaments and biological substances, initial encounter: Secondary | ICD-10-CM

## 2013-04-12 DIAGNOSIS — T887XXA Unspecified adverse effect of drug or medicament, initial encounter: Secondary | ICD-10-CM

## 2013-04-12 DIAGNOSIS — L239 Allergic contact dermatitis, unspecified cause: Secondary | ICD-10-CM

## 2013-04-12 MED ORDER — PREDNISONE 10 MG PO TABS: ORAL_TABLET | ORAL | Status: DC

## 2013-04-12 NOTE — Progress Notes (Signed)
Subjective:    Patient ID: Amber Hicks, female    DOB: [birth year], 62 y.o.   MRN: [id number]  HPI  Pt presents to the clinic today with c/o rash. She noticed this 4 days ago. It is very itchy. She was taking Doxycycline when the rash developed. She thinks it may be an allergic reaction. She has never had a rash like this before. She has not treated with anything OTC.  Review of Systems  Past Medical History  Diagnosis Date  . VITAMIN D DEFICIENCY 07/09/2010    Qualifier: Diagnosis of  By: Jonny Ruiz MD, Len Blalock   . Depression 01/22/2012  . Hyperlipidemia 01/24/2012    Current Outpatient Prescriptions  Medication Sig Dispense Refill  . aspirin 81 MG EC tablet Take 1 tablet (81 mg total) by mouth daily. Swallow whole.  30 tablet  12  . doxycycline (VIBRA-TABS) 100 MG tablet Take 1 tablet (100 mg total) by mouth 2 (two) times daily.  20 tablet  0   No current facility-administered medications for this visit.    No Known Allergies  Family History  Problem Relation Age of Onset  . Heart disease Mother     History   Social History  . Marital Status: Married    Spouse Name: N/A    Number of Children: N/A  . Years of Education: N/A   Occupational History  . Not on file.   Social History Main Topics  . Smoking status: Never Smoker   . Smokeless tobacco: Never Used  . Alcohol Use: No  . Drug Use: No  . Sexually Active: Yes    Birth Control/ Protection: Post-menopausal   Other Topics Concern  . Not on file   Social History Narrative  . No narrative on file     Constitutional: Denies fever, malaise, fatigue, headache or abrupt weight changes.   Skin: Pt reports generalized rash. Denies redness,  lesions or ulcercations.    No other specific complaints in a complete review of systems (except as listed in HPI above).     Objective:   Physical Exam   BP 108/72  Pulse 75  Temp(Src) 97.8 F (36.6 C) (Oral)  SpO2 95% Wt Readings from Last 3 Encounters:   04/05/13 148 lb 8 oz (67.359 kg)  04/15/12 150 lb 2 oz (68.096 kg)  03/21/12 151 lb (68.493 kg)    General: Appears her stated age, well developed, well nourished in NAD. Skin: Warm, dry and intact. Welt type rash noted generally on arms, legs, and face. Cardiovascular: Normal rate and rhythm. S1,S2 noted.  No murmur, rubs or gallops noted. No JVD or BLE edema. No carotid bruits noted. Pulmonary/Chest: Normal effort and positive vesicular breath sounds. No respiratory distress. No wheezes, rales or ronchi noted.    BMET    Component Value Date/Time   NA 142 09/27/2012 1654   K 4.0 09/27/2012 1654   CL 102 09/27/2012 1654   CO2 28 09/27/2012 1654   GLUCOSE 86 09/27/2012 1654   BUN 10 09/27/2012 1654   CREATININE 0.87 09/27/2012 1654   CALCIUM 9.8 09/27/2012 1654   GFRNONAA 72* 09/27/2012 1654   GFRAA 83* 09/27/2012 1654    Lipid Panel     Component Value Date/Time   CHOL 237* 01/22/2012 1615   TRIG 193.0* 01/22/2012 1615   HDL 49.60 01/22/2012 1615   CHOLHDL 5 01/22/2012 1615   VLDL 38.6 01/22/2012 1615    CBC    Component Value Date/Time   WBC 7.4  09/27/2012 1654   RBC 4.43 09/27/2012 1654   HGB 13.4 09/27/2012 1654   HCT 41.0 09/27/2012 1654   PLT 294 09/27/2012 1654   MCV 92.6 09/27/2012 1654   MCH 30.2 09/27/2012 1654   MCHC 32.7 09/27/2012 1654   RDW 13.5 09/27/2012 1654   LYMPHSABS 2.8 09/27/2012 1654   MONOABS 0.6 09/27/2012 1654   EOSABS 0.2 09/27/2012 1654   BASOSABS 0.0 09/27/2012 1654    Hgb A1C No results found for this basename: HGBA1C        Assessment & Plan:   Allergic Dermatitis secondary to Medication, new onset:  eRx for pred taper Benadryl as needed  RTC as needed or if symptoms persist or worsen

## 2013-04-12 NOTE — Patient Instructions (Signed)

## 2013-06-07 ENCOUNTER — Telehealth: Payer: Self-pay | Admitting: Internal Medicine

## 2013-06-07 ENCOUNTER — Encounter: Payer: Self-pay | Admitting: Internal Medicine

## 2013-06-07 ENCOUNTER — Ambulatory Visit (AMBULATORY_SURGERY_CENTER): Payer: Self-pay | Admitting: *Deleted

## 2013-06-07 VITALS — Ht 67.0 in | Wt 153.4 lb

## 2013-06-07 DIAGNOSIS — Z1211 Encounter for screening for malignant neoplasm of colon: Secondary | ICD-10-CM

## 2013-06-07 MED ORDER — MOVIPREP 100 G PO SOLR: ORAL | Status: DC

## 2013-06-07 NOTE — Telephone Encounter (Signed)
Pt will come by [facility] 4th floor to pick up sample of MoviPrep. 

## 2013-06-12 ENCOUNTER — Telehealth: Payer: Self-pay | Admitting: Internal Medicine

## 2013-06-12 NOTE — Telephone Encounter (Signed)
Pt will keep prep just in case she can reschedule later.

## 2013-06-21 ENCOUNTER — Encounter: Payer: BC Managed Care – PPO | Admitting: Internal Medicine

## 2013-07-17 ENCOUNTER — Other Ambulatory Visit (HOSPITAL_COMMUNITY): Payer: Self-pay | Admitting: Obstetrics and Gynecology

## 2013-07-17 DIAGNOSIS — Z1231 Encounter for screening mammogram for malignant neoplasm of breast: Secondary | ICD-10-CM

## 2013-09-19 ENCOUNTER — Ambulatory Visit (HOSPITAL_COMMUNITY): Payer: BC Managed Care – PPO

## 2013-09-22 ENCOUNTER — Ambulatory Visit (HOSPITAL_COMMUNITY): Payer: BC Managed Care – PPO

## 2013-10-03 ENCOUNTER — Ambulatory Visit (HOSPITAL_COMMUNITY): Payer: BC Managed Care – PPO

## 2013-10-13 ENCOUNTER — Ambulatory Visit (HOSPITAL_COMMUNITY): Payer: BC Managed Care – PPO

## 2013-10-13 ENCOUNTER — Ambulatory Visit (HOSPITAL_COMMUNITY)
Admission: RE | Admit: 2013-10-13 | Discharge: 2013-10-13 | Disposition: A | Discharge status: Home / Self Care | Payer: BC Managed Care – PPO | Source: Ambulatory Visit | Attending: Obstetrics and Gynecology | Admitting: Obstetrics and Gynecology

## 2013-10-13 DIAGNOSIS — Z1231 Encounter for screening mammogram for malignant neoplasm of breast: Secondary | ICD-10-CM | POA: Insufficient documentation

## 2014-04-30 ENCOUNTER — Ambulatory Visit: Payer: BC Managed Care – PPO | Admitting: Family Medicine

## 2014-08-03 ENCOUNTER — Emergency Department (HOSPITAL_COMMUNITY)
Admission: EM | Admit: 2014-08-03 | Discharge: 2014-08-03 | Disposition: A | Discharge status: Home / Self Care | Payer: BC Managed Care – PPO | Attending: Emergency Medicine | Admitting: Emergency Medicine

## 2014-08-03 ENCOUNTER — Encounter (HOSPITAL_COMMUNITY): Payer: Self-pay | Admitting: Emergency Medicine

## 2014-08-03 ENCOUNTER — Emergency Department (HOSPITAL_COMMUNITY): Payer: BC Managed Care – PPO

## 2014-08-03 DIAGNOSIS — Z8659 Personal history of other mental and behavioral disorders: Secondary | ICD-10-CM | POA: Insufficient documentation

## 2014-08-03 DIAGNOSIS — W01198A Fall on same level from slipping, tripping and stumbling with subsequent striking against other object, initial encounter: Secondary | ICD-10-CM | POA: Insufficient documentation

## 2014-08-03 DIAGNOSIS — Z8639 Personal history of other endocrine, nutritional and metabolic disease: Secondary | ICD-10-CM | POA: Insufficient documentation

## 2014-08-03 DIAGNOSIS — Z7982 Long term (current) use of aspirin: Secondary | ICD-10-CM | POA: Diagnosis not present

## 2014-08-03 DIAGNOSIS — Y9389 Activity, other specified: Secondary | ICD-10-CM | POA: Diagnosis not present

## 2014-08-03 DIAGNOSIS — S0083XA Contusion of other part of head, initial encounter: Secondary | ICD-10-CM | POA: Insufficient documentation

## 2014-08-03 DIAGNOSIS — Y9289 Other specified places as the place of occurrence of the external cause: Secondary | ICD-10-CM | POA: Diagnosis not present

## 2014-08-03 DIAGNOSIS — S0033XA Contusion of nose, initial encounter: Secondary | ICD-10-CM

## 2014-08-03 DIAGNOSIS — W19XXXA Unspecified fall, initial encounter: Secondary | ICD-10-CM

## 2014-08-03 DIAGNOSIS — S0992XA Unspecified injury of nose, initial encounter: Secondary | ICD-10-CM | POA: Diagnosis present

## 2014-08-03 IMAGING — CT CT HEAD W/O CM
4 of 9 series · 16 of 47 positions shown, 18 images · non-contrast
Comparison: None.

CLINICAL DATA: Fall. Swelling below right orbit. Laceration over
nose.

EXAM:
CT HEAD WITHOUT CONTRAST
CT MAXILLOFACIAL WITHOUT CONTRAST
CT CERVICAL SPINE WITHOUT CONTRAST
TECHNIQUE: Multidetector CT imaging of the head, cervical spine, and
maxillofacial structures were performed using the standard protocol
without intravenous contrast. Multiplanar CT image reconstructions
of the cervical spine and maxillofacial structures were also
generated.

[Series 10: sagittal soft tissue · sagittal · 0.29mm/px · 2 of 65 slices shown]
[im 22/65  brain]
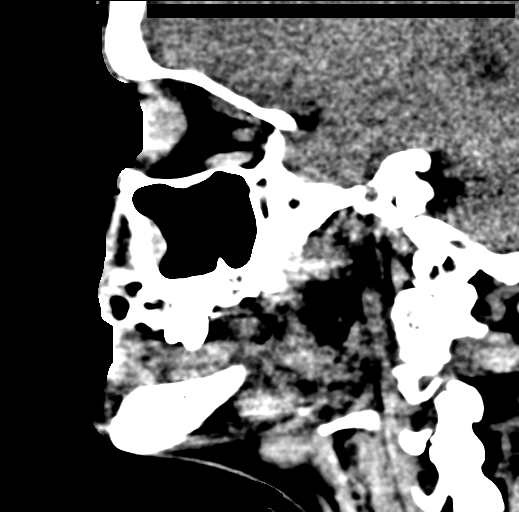
[im 43/65  brain]
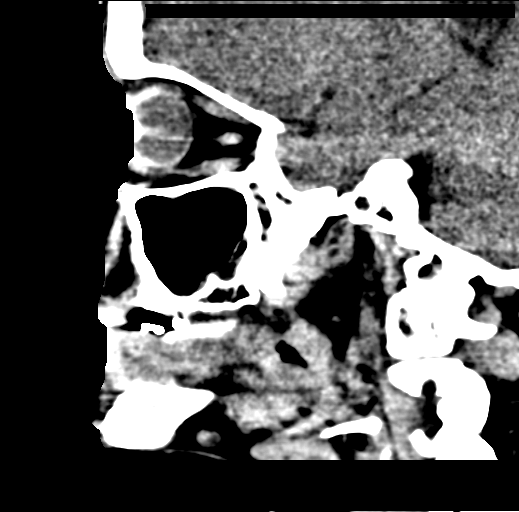

[Series 15: coronals · coronal · 0.25mm/px · 3 of 61 slices shown]
[im 16/61  brain]
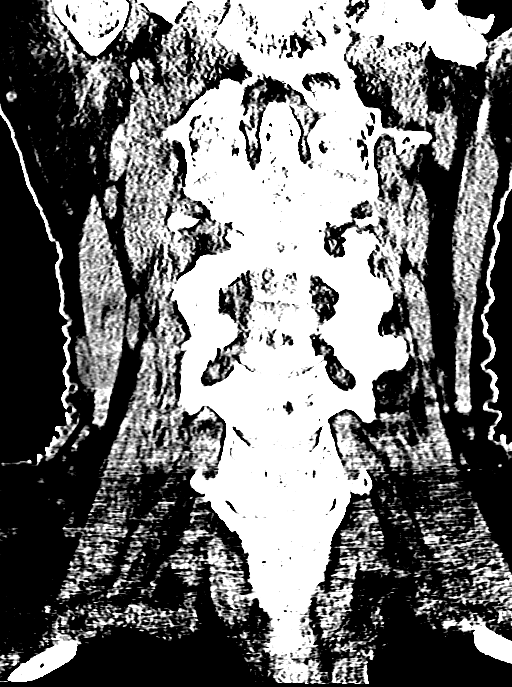
[im 31/61  brain]
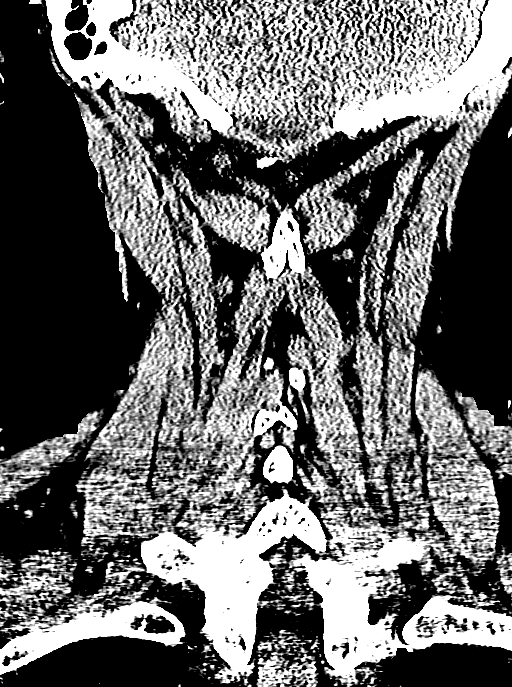
[im 46/61  brain]
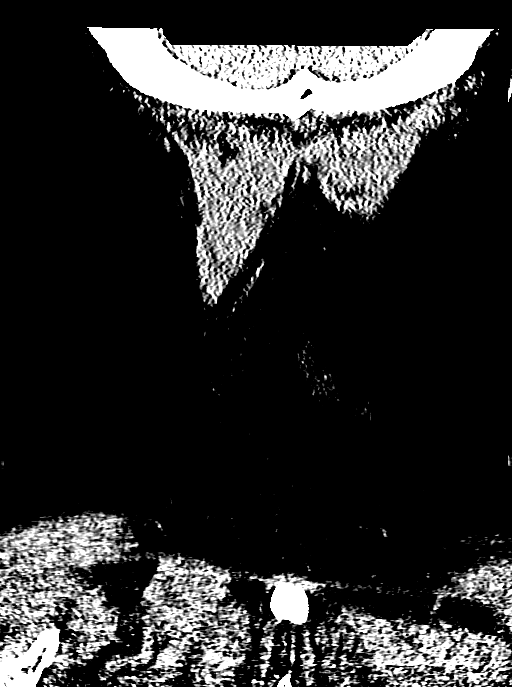

[Series 17: orthogonals · axial · 0.23mm/px · z∈[-283,-179]mm · 5 of 85 slices shown (1 of 2)]
[im 15/85  brain]
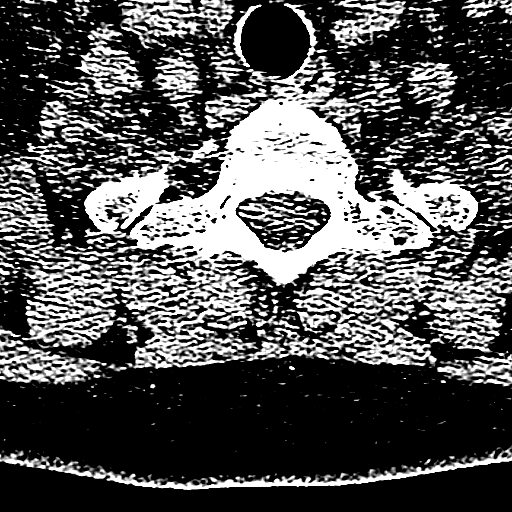
[im 29/85  brain]
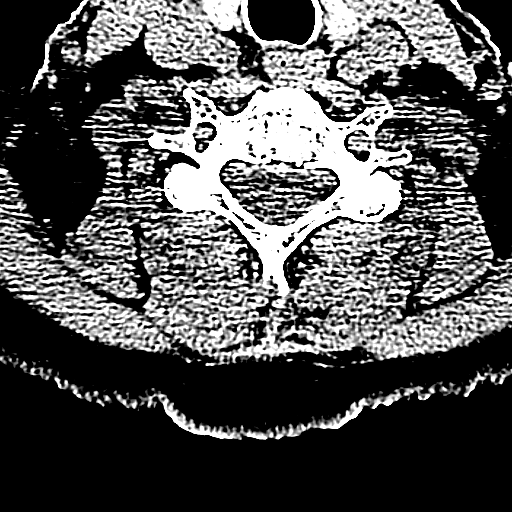
[im 43/85  brain]
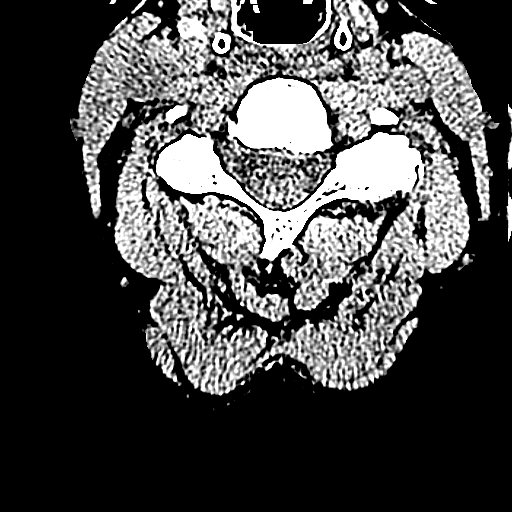
[im 57/85  brain]
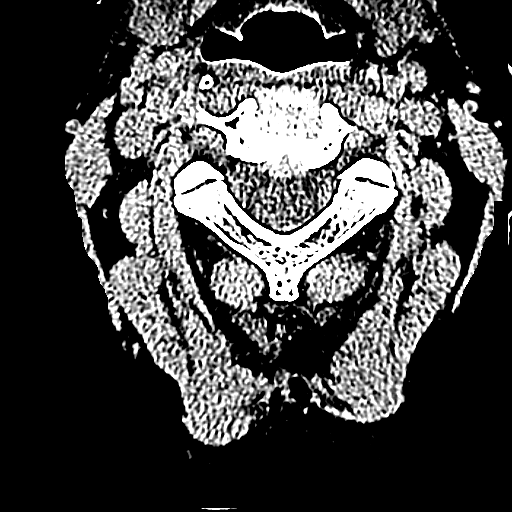
[im 71/85  brain]
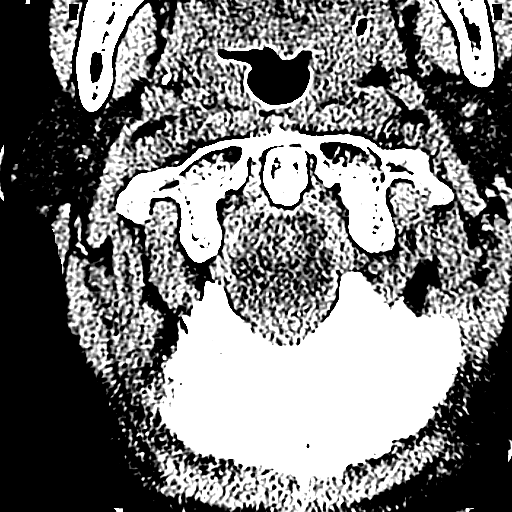

[Series 19: orthogonals · axial · 0.21mm/px · z∈[-290,-182]mm · 6 of 98 slices shown, 8 images (2 of 2)]
[im 14/98  brain]
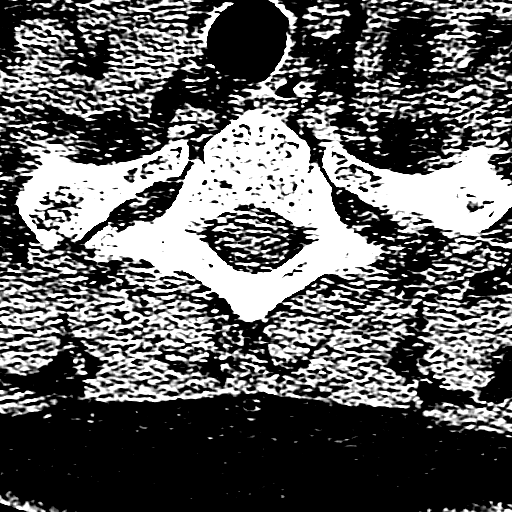
[im 14/98  bone]
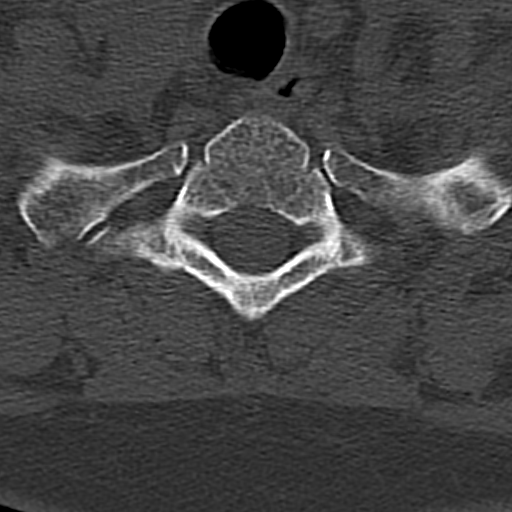
[im 28/98  brain]
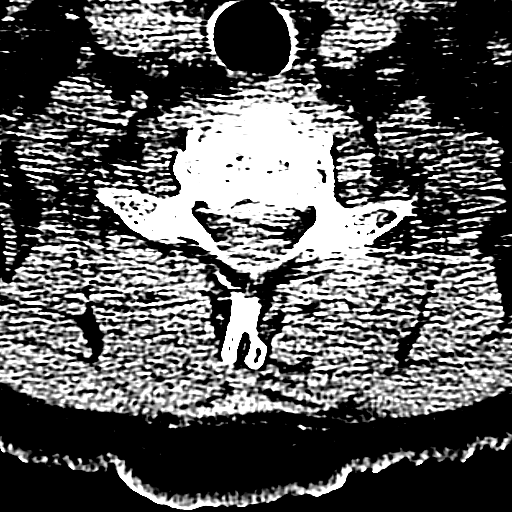
[im 42/98  brain]
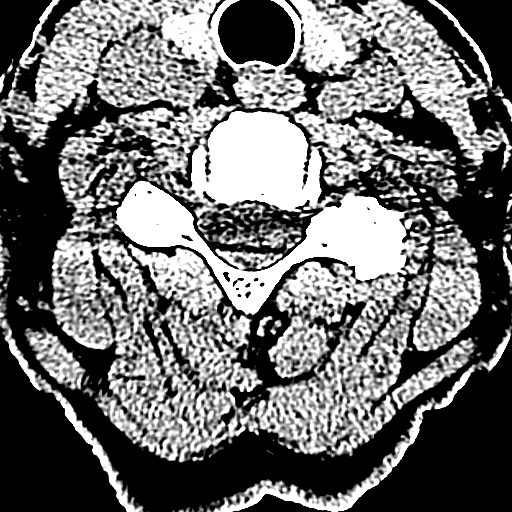
[im 56/98  brain]
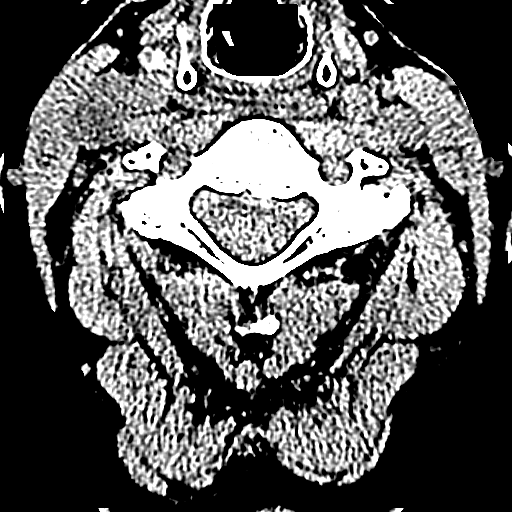
[im 70/98  brain]
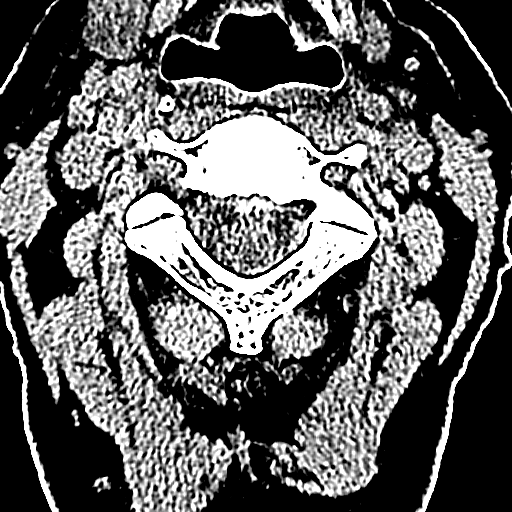
[im 70/98  bone]
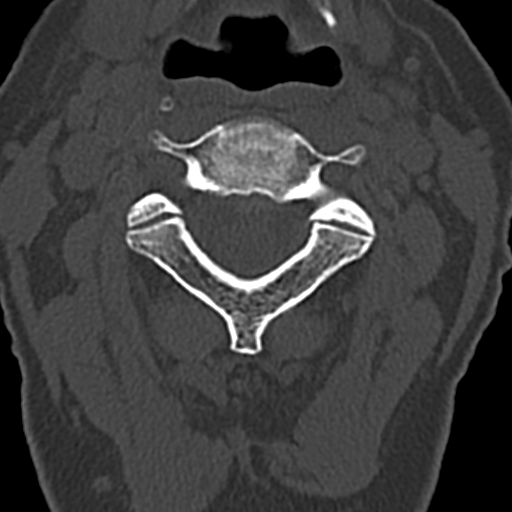
[im 84/98  brain]
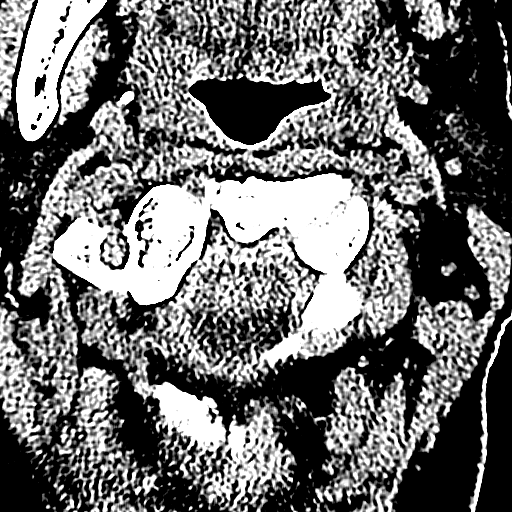

[16 of 47 positions shown; findings below may reference images not displayed]

FINDINGS: CT HEAD FINDINGS

Ventricle size is normal. Negative for intracranial hemorrhage,
mass, or infarction.

Negative for skull fracture. Mucosal edema and small air-fluid level
left maxillary sinus.

CT MAXILLOFACIAL FINDINGS

Negative for facial fracture. The orbit is intact. Zygomatic arch
intact. Negative for nasal bone fracture. Negative for fracture of
the mandible. Dental caries noted.

Mucosal edema in the paranasal sinuses with a small air-fluid level
left maxillary sinus.

6 mm calculus in the right submandibular gland without ductal
obstruction.

CT CERVICAL SPINE FINDINGS

Normal cervical alignment. Disc degeneration and spurring at C6-7.
Facet degeneration throughout the cervical spine is mild, most
prominent on the right at C7-T1. Negative for spinal stenosis.

Negative for fracture in the cervical spine.
IMPRESSION: No acute intracranial abnormality.

Negative for facial fracture.

6 mm right submandibular gland calculus, an incidental finding

Negative for cervical spine fracture.

## 2014-08-03 MED ORDER — HYDROCODONE-ACETAMINOPHEN 5-325 MG PO TABS
1.0000 | ORAL_TABLET | Freq: Once | ORAL | Status: AC
  Administered 2014-08-03: 1 via ORAL
  Filled 2014-08-03: qty 1

## 2014-08-03 NOTE — ED Notes (Signed)
Patient transported to CT 
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
## 2014-08-03 NOTE — ED Notes (Signed)
Social Worker at bedside.
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
## 2014-08-03 NOTE — ED Notes (Signed)
[employer] at bedside speaking with pt.  

## 2014-08-03 NOTE — Discharge Instructions (Signed)
Fall Prevention and Home Safety Falls cause injuries and can affect all age groups. It is possible to use preventive measures to significantly decrease the likelihood of falls. There are many simple measures which can make your home safer and prevent falls. OUTDOORS  Repair cracks and edges of walkways and driveways.  Remove high doorway thresholds.  Trim shrubbery on the main path into your home.  Have good outside lighting.  Clear walkways of tools, rocks, debris, and clutter.  Check that handrails are not broken and are securely fastened. Both sides of steps should have handrails.  Have leaves, snow, and ice cleared regularly.  Use sand or salt on walkways during winter months.  In the garage, clean up grease or oil spills. BATHROOM  Install night lights.  Install grab bars by the toilet and in the tub and shower.  Use non-skid mats or decals in the tub or shower.  Place a plastic non-slip stool in the shower to sit on, if needed.  Keep floors dry and clean up all water on the floor immediately.  Remove soap buildup in the tub or shower on a regular basis.  Secure bath mats with non-slip, double-sided rug tape.  Remove throw rugs and tripping hazards from the floors. BEDROOMS  Install night lights.  Make sure a bedside light is easy to reach.  Do not use oversized bedding.  Keep a telephone by your bedside.  Have a firm chair with side arms to use for getting dressed.  Remove throw rugs and tripping hazards from the floor. KITCHEN  Keep handles on pots and pans turned toward the center of the stove. Use back burners when possible.  Clean up spills quickly and allow time for drying.  Avoid walking on wet floors.  Avoid hot utensils and knives.  Position shelves so they are not too high or low.  Place commonly used objects within easy reach.  If necessary, use a sturdy step stool with a grab bar when reaching.  Keep electrical cables out of the  way.  Do not use floor polish or wax that makes floors slippery. If you must use wax, use non-skid floor wax.  Remove throw rugs and tripping hazards from the floor. STAIRWAYS  Never leave objects on stairs.  Place handrails on both sides of stairways and use them. Fix any loose handrails. Make sure handrails on both sides of the stairways are as long as the stairs.  Check carpeting to make sure it is firmly attached along stairs. Make repairs to worn or loose carpet promptly.  Avoid placing throw rugs at the top or bottom of stairways, or properly secure the rug with carpet tape to prevent slippage. Get rid of throw rugs, if possible.  Have an electrician put in a light switch at the top and bottom of the stairs. OTHER FALL PREVENTION TIPS  Wear low-heel or rubber-soled shoes that are supportive and fit well. Wear closed toe shoes.  When using a stepladder, make sure it is fully opened and both spreaders are firmly locked. Do not climb a closed stepladder.  Add color or contrast paint or tape to grab bars and handrails in your home. Place contrasting color strips on first and last steps.  Learn and use mobility aids as needed. Install an electrical emergency response system.  Turn on lights to avoid dark areas. Replace light bulbs that burn out immediately. Get light switches that glow.  Arrange furniture to create clear pathways. Keep furniture in the same place.  Firmly attach carpet with non-skid or double-sided tape.  Eliminate uneven floor surfaces.  Select a carpet pattern that does not visually hide the edge of steps.  Be aware of all pets. OTHER HOME SAFETY TIPS  Set the water temperature for 120 F (48.8 C).  Keep emergency numbers on or near the telephone.  Keep smoke detectors on every level of the home and near sleeping areas. Document Released: 09/04/2002 Document Revised: 03/15/2012 Document Reviewed: 12/04/2011 [facility] Patient Information 2015  [place], [place]. This information is not intended to replace advice given to you by your health care provider. Make sure you discuss any questions you have with your health care provider.  Contusion A contusion is a deep bruise. Contusions are the result of an injury that caused bleeding under the skin. The contusion may turn blue, purple, or yellow. Minor injuries will give you a painless contusion, but more severe contusions may stay painful and swollen for a few weeks.  CAUSES  A contusion is usually caused by a blow, trauma, or direct force to an area of the body. SYMPTOMS   Swelling and redness of the injured area.  Bruising of the injured area.  Tenderness and soreness of the injured area.  Pain. DIAGNOSIS  The diagnosis can be made by taking a history and physical exam. An X-ray, CT scan, or MRI may be needed to determine if there were any associated injuries, such as fractures. TREATMENT  Specific treatment will depend on what area of the body was injured. In general, the best treatment for a contusion is resting, icing, elevating, and applying cold compresses to the injured area. Over-the-counter medicines may also be recommended for pain control. Ask your caregiver what the best treatment is for your contusion. HOME CARE INSTRUCTIONS   Put ice on the injured area.  Put ice in a plastic bag.  Place a towel between your skin and the bag.  Leave the ice on for 15-20 minutes, 3-4 times a day, or as directed by your health care provider.  Only take over-the-counter or prescription medicines for pain, discomfort, or fever as directed by your caregiver. Your caregiver may recommend avoiding anti-inflammatory medicines (aspirin, ibuprofen, and naproxen) for 48 hours because these medicines may increase bruising.  Rest the injured area.  If possible, elevate the injured area to reduce swelling. SEEK IMMEDIATE MEDICAL CARE IF:   You have increased bruising or swelling.  You have  pain that is getting worse.  Your swelling or pain is not relieved with medicines. MAKE SURE YOU:   Understand these instructions.  Will watch your condition.  Will get help right away if you are not doing well or get worse. Document Released: 06/24/2005 Document Revised: 09/19/2013 Document Reviewed: 07/20/2011 [facility] Patient Information 2015 [place], [place]. This information is not intended to replace advice given to you by your health care provider. Make sure you discuss any questions you have with your health care provider.   Emergency Department Resource Guide 1) Find a Doctor and Pay Out of Pocket Although you won't have to find out who is covered by your insurance plan, it is a good idea to ask around and get recommendations. You will then need to call the office and see if the doctor you have chosen will accept you as a new patient and what types of options they offer for patients who are self-pay. Some doctors offer discounts or will set up payment plans for their patients who do not have insurance, but you will  need to ask so you aren't surprised when you get to your appointment.  2) Contact Your Local Health Department Not all health departments have doctors that can see patients for sick visits, but many do, so it is worth a call to see if yours does. If you don't know where your local health department is, you can check in your phone book. The CDC also has a tool to help you locate your state's health department, and many state websites also have listings of all of their local health departments.  3) Find a [facility] If your illness is not likely to be very severe or complicated, you may want to try a walk in clinic. These are popping up all over the country in pharmacies, drugstores, and shopping centers. They're usually staffed by nurse practitioners or physician assistants that have been trained to treat common illnesses and complaints. They're usually fairly quick and  inexpensive. However, if you have serious medical issues or chronic medical problems, these are probably not your best option.  No Primary Care Doctor: - Call Health Connect at  [phone] - they can help you locate a primary care doctor that  accepts your insurance, provides certain services, etc. - Physician Referral Service- [phone]  Chronic Pain Problems: Organization         Address  Phone   Notes  [facility]  [phone] Patients need to be referred by their primary care doctor.   Medication Assistance: Organization         Address  Phone   Notes  [facility] Medication [address] [place], [place] [address] [phone] --Must be a resident of [facility] -- Must have NO insurance coverage whatsoever (no Medicaid/ Medicare, etc.) -- The pt. MUST have a primary care doctor that directs their care regularly and follows them in the community   MedAssist  [phone]   [employer]  [phone]    Agencies that provide inexpensive medical care: Organization         Address  Phone   Notes  [place]  [phone]   [facility]    [phone]   [facility] [place], [place] [address] [phone]   [facility] [address] [place] [phone]   Planned Parenthood    [phone]   [facility]    [phone]   [place] and [address], [place] Phone:  [phone], Fax:  [phone] Hours of Operation:  9 am - 6 pm, M-F.  Also accepts Medicaid/Medicare and self-pay.  [facility] for [place] [address], [address], [place] Phone: [phone], Fax: [phone]. Hours of Operation:  8:30 am - 5:30 pm, M-F.  Also accepts Medicaid and self-pay.  [facility] [place] [address], [place] Phone: [phone]   [place], [place], [place] [phone], Ext. [phone] Mondays & Thursdays: 7-9 AM.  First 15 patients are seen on a first come, first serve basis.    Speed Providers:  Organization         Address  Phone   Notes  [facility] [address], [place] [phone] Also accepts self-pay patients.  [facility] [address], [place], [place]  (  [place]) [place], [address], [place] [phone]   [employer] [address], [place] [phone]   Lucianne Lei [address], [place]   [phone] Only accepts [place] Access [place] patients after they have their name applied to their card.   Self-Pay (no insurance) in [facility]:  Organization         Address  Phone   Notes  Sickle Cell Patients, [facility] Internal [facility] [place] [phone]   [facility] [place] [phone]   [facility] [place]  [place], [place], [place] [phone]   [facility]/Dr. Osei-Bonsu  [address], [place] or [address], [place] [phone] Phone number for both [place] and [place] locations is the same.  [facility] and [facility] [address] [place] [phone]   [facility] [address], [place] or [address] [phone]   [facility] [address], [place] [phone], phone; [phone], fax Sees patients 1st and 3rd Saturday of every month.  Must not qualify for public or private insurance (i.e. Medicaid, Medicare, [employer] Health Choice, Veterans' Benefits)  Household income should be no more than 200% of the poverty level The clinic cannot treat you if you are pregnant or  think you are pregnant  Sexually transmitted diseases are not treated at the clinic.    Dental Care: Organization         Address  Phone  Notes  [facility] Department of [facility] [place] [phone] Accepts children up to age 9 who are enrolled in [place] or [place]; pregnant women with a Medicaid card; and children who have applied for Medicaid or [place] Health Choice, but were declined, whose parents can pay a reduced fee at time of service.  [facility] Department of [facility]  [address], [place] [phone] Accepts children up to age 48 who are enrolled in [place] or [place]; pregnant women with a Medicaid card; and children who have applied for Medicaid or [place] Health Choice, but were declined, whose parents can pay a reduced fee at time of service.  [facility] Access PRO[facility]  [place] [phone] Patients are seen by appointment only. Walk-ins are not accepted. [place] will see patients 68 years of age and older. Monday - Tuesday (8am-5pm) Most Wednesdays (8:30-5pm) $30 per visit, cash only  [place] Adult [facility] Access PROGRAM  [address], [facility] [phone] Patients are seen by appointment only. Walk-ins are not accepted. [place] will see patients 60 years of age and older. One Wednesday Evening (Monthly: Volunteer Based).  $30 per visit, cash only  [place]  [phone] for adults; Children under age 45, call Graduate Pediatric Dentistry at [phone]. Children aged 17-14, please call [phone] to request a pediatric application.  Dental services are provided in all areas of dental care including fillings, crowns and bridges, complete and partial dentures, implants, gum treatment, root canals, and extractions. Preventive care is also provided. Treatment is provided to both adults  and children. Patients are selected via a lottery and there is  often a waiting list.   [facility] [address] [place]  [phone] [email or web address]   [facility] [address] [place], [place] [phone], Ext. [phone] Second and Fourth Thursday of each month, opens at 6:30 AM; Clinic ends at 9 AM.  Patients are seen on a first-come first-served basis, and a limited number are seen during each clinic.   [facility]  [address] Hillard Danker [place], [place] [phone]   Eligibility Requirements You must have lived in [place], [place], or [place] counties for at least the last three months.   You cannot be eligible for state or federal sponsored [employer], including [employer], [place], or [employer].   You generally cannot be eligible for healthcare insurance through your employer.    How to apply: Eligibility screenings are held every Tuesday and Wednesday afternoon from 1:00 pm until 4:00 pm. You do not need an appointment for the interview!  [facility] [address], [place], [place]   [place]  [place] Department  [place]  [phone]    Behavioral Health Resources in the Community: Intensive Outpatient Programs Organization         Address  Phone  Notes  Andrew [place]. [address] [place], [place] [phone]   [facility] Outpatient [address] [place], [place]   ADS: [facility] [address] [place], [place]   [place] [address],  [place], [place] or [phone]   Substance Abuse Resources Organization         Address  Phone  Notes  Alcohol and Drug Services  3677615907   [place]  [phone]   [facility]    Chinita Pester  [phone]   Residential & Outpatient Substance Abuse Program  9070756414   Psychological Services Organization         Address  Phone  Notes  [facility] [place]  [place]  [phone]   [place] [address] [place] or [phone]    Mobile Crisis Teams Organization         Address  Phone  Notes  [facility], Mobile Crisis Care Unit  [phone]   [facility]  [address] [place], [place]   [place] Levels [address] [place] [phone]    Self-Help/Support Groups Organization         Address  Phone             Notes  [place]. of [place] - variety of support groups  [place] Call for more information  Narcotics Anonymous (NA), [facility] [address], [employer] [place]  2 meetings at this location   Special educational needs teacher         Address  Phone  Notes  ASAP Residential Treatment [place],    [place]  [phone]   [facility]  [address], [place] [address], [place], [place]   Milo Glasscock, [place] [phone] Admissions: 8am-3pm M-F  Incentives Substance [place] [address]    [place], [place]   [facility] [address] [place], [place], [place]  [facility] [address],  [place], [place]   [facility] Programs - Intensive Outpatient [address] Kristeen [address], [place], [place]   [facility] ([place].,  [place], [place] [phone] or [phone]   [facility]idential Treatment Services (RTS) [address], [place], [place] Accepts Medicaid  [facility] [place] [address],  [place] [phone] Substance Abuse/Addiction Treatment   [facility] Organization         Address  Phone  Notes  [facility]  [phone]   Domenic Schwab, PhD [address] Arlis Porta [place], [place]   [phone] or [phone]   [place], [place] [phone]   [address], [place], [place] [phone] Insurance/Medicaid/sponsorship through [facility] and Families [address]                                    [place], [place] [phone] [place] [address], [place] [phone]    Dr. Adele Schilder  [phone]   [facility] [place] Dept. 1) [address] [place] 2) [place] 3)  [address], [place] [phone]  [phone]   [place] [phone] or [phone] (After Hours)

## 2014-08-03 NOTE — ED Notes (Signed)
Patient was at home when she was walking toward her front door when she tripped and fell.  Pt has an abrasion to the left forehead and scant bleeding from the bridge of the nostril and dried blood present in both nostrils.  Pain is 9/10 in the nose.  Denies LOC, no nausea or vomiting.

## 2014-08-03 NOTE — ED Notes (Signed)
Pt undressed, in gown, on continuous pulse oximetry and blood pressure cuff 
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
## 2014-08-07 ENCOUNTER — Ambulatory Visit: Payer: BC Managed Care – PPO | Admitting: Internal Medicine

## 2014-08-10 ENCOUNTER — Telehealth: Payer: Self-pay | Admitting: Internal Medicine

## 2014-08-10 ENCOUNTER — Ambulatory Visit: Payer: BC Managed Care – PPO | Admitting: Internal Medicine
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
## 2014-08-17 ENCOUNTER — Other Ambulatory Visit (INDEPENDENT_AMBULATORY_CARE_PROVIDER_SITE_OTHER): Payer: BC Managed Care – PPO

## 2014-08-17 ENCOUNTER — Ambulatory Visit (INDEPENDENT_AMBULATORY_CARE_PROVIDER_SITE_OTHER): Payer: BC Managed Care – PPO | Admitting: Internal Medicine

## 2014-08-17 ENCOUNTER — Encounter: Payer: Self-pay | Admitting: Internal Medicine

## 2014-08-17 VITALS — BP 120/64 | HR 81 | Temp 98.1°F | Ht 67.0 in | Wt 155.1 lb

## 2014-08-17 DIAGNOSIS — R42 Dizziness and giddiness: Secondary | ICD-10-CM

## 2014-08-17 DIAGNOSIS — Z0000 Encounter for general adult medical examination without abnormal findings: Secondary | ICD-10-CM

## 2014-08-17 LAB — CBC WITH DIFFERENTIAL/PLATELET
Basophils Absolute: 0 10*3/uL (ref 0.0–0.1)
Basophils Relative: 0.5 % (ref 0.0–3.0)
Eosinophils Absolute: 0.2 10*3/uL (ref 0.0–0.7)
Eosinophils Relative: 2.7 % (ref 0.0–5.0)
HCT: 38.5 % (ref 36.0–46.0)
Hemoglobin: 13 g/dL (ref 12.0–15.0)
Lymphocytes Relative: 31.7 % (ref 12.0–46.0)
Lymphs Abs: 2.4 10*3/uL (ref 0.7–4.0)
MCHC: 33.9 g/dL (ref 30.0–36.0)
MCV: 92.1 fl (ref 78.0–100.0)
Monocytes Absolute: 0.4 10*3/uL (ref 0.1–1.0)
Monocytes Relative: 5.9 % (ref 3.0–12.0)
Neutro Abs: 4.5 10*3/uL (ref 1.4–7.7)
Neutrophils Relative %: 59.2 % (ref 43.0–77.0)
Platelets: 281 10*3/uL (ref 150.0–400.0)
RBC: 4.17 Mil/uL (ref 3.87–5.11)
RDW: 13.4 % (ref 11.5–15.5)
WBC: 7.6 10*3/uL (ref 4.0–10.5)

## 2014-08-17 LAB — LIPID PANEL
Cholesterol: 221 mg/dL — ABNORMAL HIGH (ref 0–200)
HDL: 48 mg/dL (ref >39.00)
LDL Cholesterol: 147 mg/dL — ABNORMAL HIGH (ref 0–99)
NonHDL: 173
Total CHOL/HDL Ratio: 5
Triglycerides: 132 mg/dL (ref 0.0–149.0)
VLDL: 26.4 mg/dL (ref 0.0–40.0)

## 2014-08-17 LAB — BASIC METABOLIC PANEL
BUN: 18 mg/dL (ref 6–23)
CO2: 28 mEq/L (ref 19–32)
Calcium: 10.1 mg/dL (ref 8.4–10.5)
Chloride: 106 mEq/L (ref 96–112)
Creatinine, Ser: 0.8 mg/dL (ref 0.4–1.2)
GFR: 80.84 mL/min (ref >60.00)
Glucose, Bld: 92 mg/dL (ref 70–99)
Potassium: 4.1 mEq/L (ref 3.5–5.1)
Sodium: 142 mEq/L (ref 135–145)

## 2014-08-17 LAB — URINALYSIS, ROUTINE W REFLEX MICROSCOPIC
Bilirubin Urine: NEGATIVE
Ketones, ur: NEGATIVE
Nitrite: POSITIVE — AB
Specific Gravity, Urine: 1.025 (ref 1.000–1.030)
Total Protein, Urine: NEGATIVE
Urine Glucose: NEGATIVE
Urobilinogen, UA: 0.2 (ref 0.0–1.0)
pH: 6 (ref 5.0–8.0)

## 2014-08-17 LAB — HEPATIC FUNCTION PANEL
ALT: 17 U/L (ref 0–35)
AST: 22 U/L (ref 0–37)
Albumin: 4.1 g/dL (ref 3.5–5.2)
Alkaline Phosphatase: 88 U/L (ref 39–117)
Bilirubin, Direct: 0 mg/dL (ref 0.0–0.3)
Total Bilirubin: 0.5 mg/dL (ref 0.2–1.2)
Total Protein: 7.3 g/dL (ref 6.0–8.3)

## 2014-08-17 LAB — TSH: TSH: 1.09 u[IU]/mL (ref 0.35–4.50)

## 2014-08-17 MED ORDER — DOXYCYCLINE HYCLATE 100 MG PO TABS: 100.0000 mg | ORAL_TABLET | Freq: Two times a day (BID) | ORAL | Status: DC

## 2014-08-17 NOTE — Patient Instructions (Signed)
Please continue all other medications as before, and refills have been done if requested.  Please have the pharmacy call with any other refills you may need.  Please continue your efforts at being more active, low cholesterol diet, and weight control.  You are otherwise up to date with prevention measures today.  Please keep your appointments with your specialists as you may have planned  You will be contacted regarding the referral for: mammogram, and the MRI  .Please go to the LAB in the Basement (turn left off the elevator) for the tests to be done today  You will be contacted by phone if any changes need to be made immediately.  Otherwise, you will receive a letter about your results with an explanation, but please check with MyChart first.  Please remember to sign up for MyChart if you have not done so, as this will be important to you in the future with finding out test results, communicating by private email, and scheduling acute appointments online when needed.  Please return in 6 months, or sooner if needed

## 2014-08-17 NOTE — Assessment & Plan Note (Signed)

## 2014-08-17 NOTE — Assessment & Plan Note (Signed)
?   Central vs peripheral, exam ok but still with marked symptoms and difficulty ambulation, ct head neg in ER recent, for MRI - r/o cerebellar eitology/cva

## 2014-08-17 NOTE — Progress Notes (Signed)
Subjective:    Patient ID: Amber Hicks, female    DOB: Mar 07, 1951, 63 y.o.   MRN: [id number]  HPI  Here for wellness and f/u;  Overall doing ok;  Pt denies CP, worsening SOB, DOE, wheezing, orthopnea, PND, worsening LE edema, palpitations, dizziness or syncope.  Pt denies neurological change such as facial or extremity weakness, but still has significant dizziness and feels drunk with walking somewhat for the past wk without improvement.  .  Pt denies polydipsia, polyuria, or low sugar symptoms. Pt states overall good compliance with treatment and medications, good tolerability, and has been trying to follow lower cholesterol diet.  Pt denies worsening depressive symptoms, suicidal ideation or panic. No fever, night sweats, wt loss, loss of appetite, or other constitutional symptoms.  Pt states good ability with ADL's, has low fall risk, home safety reviewed and adequate, no other significant changes in hearing or vision, and only occasionally active with exercise. Past Medical History  Diagnosis Date  . VITAMIN D DEFICIENCY 07/09/2010    Qualifier: Diagnosis of  By: Jenny Reichmann MD, [facility]   . Depression 01/22/2012  . Hyperlipidemia 01/24/2012  . Colitis   . Facial abscess    Past Surgical History  Procedure Laterality Date  . Ceaserian section      reports that she has never smoked. She has never used smokeless tobacco. She reports that she does not drink alcohol or use illicit drugs. family history includes Heart disease in her mother. There is no history of Colon cancer. Allergies  Allergen Reactions  . Doxycycline Rash   Current Outpatient Prescriptions on File Prior to Visit  Medication Sig Dispense Refill  . acetaminophen (TYLENOL) 325 MG tablet Take 650 mg by mouth every 6 (six) hours as needed for mild pain.    Marland Kitchen aspirin 81 MG EC tablet Take 1 tablet (81 mg total) by mouth daily. Swallow whole. 30 tablet 12  . Ibuprofen (ADVIL) 200 MG CAPS Take by mouth as needed.    Marland Kitchen AFLURIA  PRESERVATIVE FREE 0.5 ML SUSY Inject 1 Dose as directed once.  0   No current facility-administered medications on file prior to visit.   Review of Systems Constitutional: Negative for increased diaphoresis, other activity, appetite or other siginficant weight change  HENT: Negative for worsening hearing loss, ear pain, facial swelling, mouth sores and neck stiffness.   Eyes: Negative for other worsening pain, redness or visual disturbance.  Respiratory: Negative for shortness of breath and wheezing.   Cardiovascular: Negative for chest pain and palpitations.  Gastrointestinal: Negative for diarrhea, blood in stool, abdominal distention or other pain Genitourinary: Negative for hematuria, flank pain or change in urine volume.  Musculoskeletal: Negative for myalgias or other joint complaints.  Skin: Negative for color change and wound.  Neurological: Negative for syncope and numbness. other than noted Hematological: Negative for adenopathy. or other swelling Psychiatric/Behavioral: Negative for hallucinations, self-injury, decreased concentration or other worsening agitation.      Objective:   Physical Exam BP 120/64 mmHg  Pulse 81  Temp(Src) 98.1 F (36.7 C) (Oral)  Ht 5\' 7"  (1.702 m)  Wt 155 lb 2 oz (70.364 kg)  BMI 24.29 kg/m2  SpO2 97% VS noted,  Constitutional: Pt is oriented to person, place, and time. Appears well-developed and well-nourished.  Head: Normocephalic and atraumatic.  Right Ear: External ear normal.  Left Ear: External ear normal.  Nose: Nose normal.  Mouth/Throat: Oropharynx is clear and moist.  Eyes: Conjunctivae and EOM are normal. Pupils  are equal, round, and reactive to light.  Neck: Normal range of motion. Neck supple. No JVD present. No tracheal deviation present.  Cardiovascular: Normal rate, regular rhythm, normal heart sounds and intact distal pulses.   Pulmonary/Chest: Effort normal and breath sounds without rales or wheezing  Abdominal: Soft.  Bowel sounds are normal. NT. No HSM  Musculoskeletal: Normal range of motion. Exhibits no edema.  Lymphadenopathy:  Has no cervical adenopathy.  Neurological: Pt is alert and oriented to person, place, and time. Pt has normal reflexes. No cranial nerve deficit. Motor grossly intact, cbl nl, cn 2-12 intact, gait somewhat unsteady  Skin: Skin is warm and dry. No rash noted.  Psychiatric:  Has normal mood and affect. Behavior is normal.     Assessment & Plan:

## 2014-08-17 NOTE — Progress Notes (Signed)
Pre visit review using our clinic review tool, if applicable. No additional management support is needed unless otherwise documented below in the visit note. 

## 2014-08-20 ENCOUNTER — Encounter: Payer: Self-pay | Admitting: Internal Medicine

## 2014-08-20 ENCOUNTER — Other Ambulatory Visit: Payer: Self-pay | Admitting: Internal Medicine

## 2014-08-20 MED ORDER — ATORVASTATIN CALCIUM 10 MG PO TABS: 10.0000 mg | ORAL_TABLET | Freq: Every day | ORAL | Status: DC

## 2014-08-21 ENCOUNTER — Telehealth: Payer: Self-pay | Admitting: Internal Medicine

## 2014-08-21 NOTE — Telephone Encounter (Signed)
Pt requesting a call, [phone]. Pt picked up two meds at pharmacy, one was for acne (pills). When she got home she noticed pills were for acne, she states she has no acne. Pls advise

## 2014-08-21 NOTE — Telephone Encounter (Signed)
Called the patient and she picked up a prescription written by Dr. Jenny Reichmann for Doxycycline and does not know what she needs to take it for??

## 2014-08-21 NOTE — Telephone Encounter (Signed)
i am confused as well, as she is allergic to doxycyclne, nad this was not prescribed at her last visit  [place] to Not take the doxycycline

## 2014-08-21 NOTE — Telephone Encounter (Signed)
Called the patient line was busy.

## 2014-08-22 NOTE — Telephone Encounter (Signed)
Called the patient and line was busy. 

## 2014-08-22 NOTE — Telephone Encounter (Signed)
Called left message to call back 

## 2014-08-22 NOTE — Telephone Encounter (Signed)
Patient informed of PCP instructions on medication. 

## 2014-08-22 NOTE — Telephone Encounter (Signed)
Called the patient

## 2014-09-03 ENCOUNTER — Telehealth: Payer: Self-pay | Admitting: Internal Medicine

## 2014-09-03 ENCOUNTER — Telehealth: Payer: Self-pay | Admitting: *Deleted

## 2014-09-03 NOTE — Telephone Encounter (Signed)
[facility] - Client TELEPHONE ADVICE RECORD [facility] Patient Name: Amber Hicks Gender: Female DOB: 1951-08-11 Age: 63 Y 18 M 16 D Return Phone Number: [phone] (Primary) Address: [address] City/State/Zip: [place] [address] Client [facility] - Client Client Site [place] - Day Physician Cathlean Cower Contact Type Call Call Type Triage / Clinical Relationship To Patient Self Return Phone Number [phone] (Primary) Chief Complaint Headache Initial Comment Caller states, has headaches, advil is not helping PreDisposition InappropriateToAsk Nurse Assessment Nurse: Gretta Cool, RN, Byrd Hesselbach Date/Time Eilene Ghazi Time): 09/03/2014 11:48:06 AM Confirm and document reason for call. If symptomatic, describe symptoms. ---Caller states, has headaches, advil is not helping. Fell on 09/02/14 and hit nose. Has had ha ever since. Pain score 8/10. Bothers her in her sleep. Feels like someone is "beating her in the head with a hammer". 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 Has the patient traveled out of the country within the last 30 days? ---Not Applicable Does the patient require triage? ---Yes Related visit to physician within the last 2 weeks? ---Yes Does the PT have any chronic conditions? (i.e. diabetes, asthma, etc.) ---No Guidelines Guideline Title Affirmed Question Affirmed Notes Nurse Date/Time (Eastern Time) Headache [1] SEVERE headache (e.g., excruciating) AND [2] not improved after 2 hours of pain medicine Gretta Cool, RN, Byrd Hesselbach 09/03/2014 11:50:48 AM Disp. Time Eilene Ghazi Time) Disposition Final User 09/03/2014 12:05:02 PM See Physician within 4 Hours (or PCP triage) Yes Gretta Cool, RN, Byrd Hesselbach Caller Understands: Yes Disagree/Comply: Comply PLEASE NOTE: All timestamps contained within this report are represented as [place] Standard Time. CONFIDENTIALTY NOTICE: This fax transmission is  intended only for the addressee. It contains information that is legally privileged, confidential or otherwise protected from use or disclosure. If you are not the intended recipient, you are strictly prohibited from reviewing, disclosing, copying using or disseminating any of this information or taking any action in reliance on or regarding this information. If you have received this fax in error, please notify us immediately by telephone so that we can arrange for its return to [place]. Phone: [phone], Toll-Free: [phone], Fax: [phone] Page: 2 of 2 Call Id: [id number] Care Advice Given Per Guideline SEE PHYSICIAN WITHIN 4 HOURS (or PCP triage): IBUPROFEN (E.G., MOTRIN, ADVIL): * Take 400 mg (two 200 mg pills) by mouth every 6 hours as needed. * Another choice is to take 600 mg (three 200 mg pills) by mouth every 8 hours as needed. DRIVING: Another adult should drive. CALL BACK IF: * You become worse. CARE ADVICE given per Headache (Adult) guideline. After Care Instructions Given Call Event Type User Date / Time Description Comments User: Byrd Hesselbach, Gretta Cool, RN Date/Time Eilene Ghazi Time): 09/03/2014 12:05:37 PM Warm transferred pt to the office backline for appt Referrals REFERRED TO PCP OFFICE

## 2014-09-03 NOTE — Telephone Encounter (Signed)
error 

## 2014-09-04 ENCOUNTER — Ambulatory Visit (INDEPENDENT_AMBULATORY_CARE_PROVIDER_SITE_OTHER): Payer: BC Managed Care – PPO | Admitting: Family

## 2014-09-04 ENCOUNTER — Encounter: Payer: Self-pay | Admitting: Family

## 2014-09-04 VITALS — BP 110/72 | HR 59 | Temp 97.8°F | Resp 18 | Ht 67.0 in | Wt 155.4 lb

## 2014-09-04 DIAGNOSIS — S0990XS Unspecified injury of head, sequela: Secondary | ICD-10-CM

## 2014-09-04 DIAGNOSIS — G44309 Post-traumatic headache, unspecified, not intractable: Secondary | ICD-10-CM

## 2014-09-04 MED ORDER — IBUPROFEN 800 MG PO TABS: 800.0000 mg | ORAL_TABLET | Freq: Three times a day (TID) | ORAL | Status: DC | PRN

## 2014-09-04 MED ORDER — KETOROLAC TROMETHAMINE 60 MG/2ML IM SOLN
60.0000 mg | Freq: Once | INTRAMUSCULAR | Status: AC
  Administered 2014-09-04: 60 mg via INTRAMUSCULAR

## 2014-09-04 NOTE — Progress Notes (Signed)
Pre visit review using our clinic review tool, if applicable. No additional management support is needed unless otherwise documented below in the visit note. 

## 2014-09-04 NOTE — Patient Instructions (Signed)
Thank you for choosing [facility].  Summary/Instructions:  Your prescription(s) have been submitted to your pharmacy. Please take as directed and contact our office if you believe you are having problem(s) with the medication(s).  If your symptoms worsen or fail to improve, please contact our office for further instruction, or in case of emergency go directly to the emergency room at the closest medical facility.   Follow up with Dr. Jenny Reichmann if symptoms fail to improve.

## 2014-09-04 NOTE — Progress Notes (Signed)
   Subjective:    Patient ID: Amber Hicks, female    DOB: [birth year], 63 y.o.   MRN: [id number]  Chief Complaint  Patient presents with  . Headache    x2 days    HPI:  Amber Hicks is a 63 y.o. female who presents today for a headache.   Was going to the front door about a month ago and had a fall. She was seen in the Emergency Room where she was diagnosed with a nasal and forehead contusion. CT imaging at that time was negative for any intracranial abnormality. Was seen in the office and was scheduled for an MRI. Emergency room notes and imaging was reviewed in detail.  Currently continues to experience headaches that feel like they go from the front of head to the back of her head. The course of the headaches has remained constant since the initial injury. Has tried OTC advil which provided minimal relief.   Allergies  Allergen Reactions  . Doxycycline Rash   Current Outpatient Prescriptions on File Prior to Visit  Medication Sig Dispense Refill  . acetaminophen (TYLENOL) 325 MG tablet Take 650 mg by mouth every 6 (six) hours as needed for mild pain.    Marland Kitchen AFLURIA PRESERVATIVE FREE 0.5 ML SUSY Inject 1 Dose as directed once.  0  . aspirin 81 MG EC tablet Take 1 tablet (81 mg total) by mouth daily. Swallow whole. 30 tablet 12  . atorvastatin (LIPITOR) 10 MG tablet Take 1 tablet (10 mg total) by mouth daily. 90 tablet 3  . Ibuprofen (ADVIL) 200 MG CAPS Take by mouth as needed.     No current facility-administered medications on file prior to visit.    Review of Systems    See HPI  Objective:    BP 110/72 mmHg  Pulse 59  Temp(Src) 97.8 F (36.6 C) (Oral)  Resp 18  Ht 5\' 7"  (1.702 m)  Wt 155 lb 6.4 oz (70.489 kg)  BMI 24.33 kg/m2  SpO2 97% Nursing note and vital signs reviewed.  Physical Exam  Constitutional: She is oriented to person, place, and time. She appears well-developed and well-nourished. No distress.  Cardiovascular: Normal rate, regular rhythm,  normal heart sounds and intact distal pulses.   Pulmonary/Chest: Effort normal and breath sounds normal.  Neurological: She is alert and oriented to person, place, and time. She has normal strength and normal reflexes. No cranial nerve deficit or sensory deficit. She displays a negative Romberg sign.  Some dizziness noted with movements.   Skin: Skin is warm and dry.  Psychiatric: She has a normal mood and affect. Her behavior is normal. Judgment and thought content normal.       Assessment & Plan:

## 2014-09-04 NOTE — Assessment & Plan Note (Signed)
Residual headache from her fall. Her neurological exam is benign with some vertigo. She is scheduled to undergo an MRI for the vertigo per her previous office visit. In office ketorolac given. Start 800 mg ibuprofen Q8 as needed for headache. Follow up after completion of MRI with PCP or her symptoms worsen or fail to improve.

## 2014-10-19 ENCOUNTER — Ambulatory Visit (HOSPITAL_COMMUNITY): Admission: RE | Admit: 2014-10-19 | Payer: BLUE CROSS/BLUE SHIELD | Source: Ambulatory Visit

## 2014-10-26 ENCOUNTER — Ambulatory Visit (HOSPITAL_COMMUNITY): Payer: BLUE CROSS/BLUE SHIELD

## 2014-11-01 ENCOUNTER — Telehealth: Payer: Self-pay | Admitting: Internal Medicine

## 2014-11-01 ENCOUNTER — Encounter: Payer: Self-pay | Admitting: Internal Medicine

## 2014-11-01 NOTE — Telephone Encounter (Signed)
Pt called stated that Dr. Jenny Reichmann want here to have 2 test done when she was here last ov, but she isnt sure what else beside the MRI (pt has an appt already). Please check and call her back, pt stated that something about cancen screening (do not see in the note).

## 2014-11-01 NOTE — Telephone Encounter (Signed)
Last OV was with Earnstine Regal - will forward

## 2014-11-02 NOTE — Telephone Encounter (Signed)
Let pt know that were are not aware of any other testing that needs to be done. She said she will just do the cancer screening since it is already set up and the MRI.

## 2014-11-02 NOTE — Telephone Encounter (Signed)
Please inform the patient there were no further tests that I ordered for her. Dr. Jenny Reichmann ordered the MRI and whatever other tests prior to her seeing me. There were no other screens that I ordered.

## 2014-11-09 ENCOUNTER — Other Ambulatory Visit: Payer: Self-pay | Admitting: Internal Medicine

## 2014-11-09 ENCOUNTER — Ambulatory Visit (HOSPITAL_COMMUNITY)
Admission: RE | Admit: 2014-11-09 | Discharge: 2014-11-09 | Disposition: A | Discharge status: Home / Self Care | Payer: BC Managed Care – PPO | Source: Ambulatory Visit | Attending: Internal Medicine | Admitting: Internal Medicine

## 2014-11-09 DIAGNOSIS — Z1231 Encounter for screening mammogram for malignant neoplasm of breast: Secondary | ICD-10-CM | POA: Diagnosis present

## 2014-11-09 DIAGNOSIS — Z0000 Encounter for general adult medical examination without abnormal findings: Secondary | ICD-10-CM

## 2014-11-09 IMAGING — MG MM DIGITAL SCREENING BILAT
5 series · 5 of 5 positions shown · non-contrast
Comparison: Previous exam(s).

CLINICAL DATA: Screening.

EXAM:
DIGITAL SCREENING BILATERAL MAMMOGRAM WITH CAD

[R MLO]
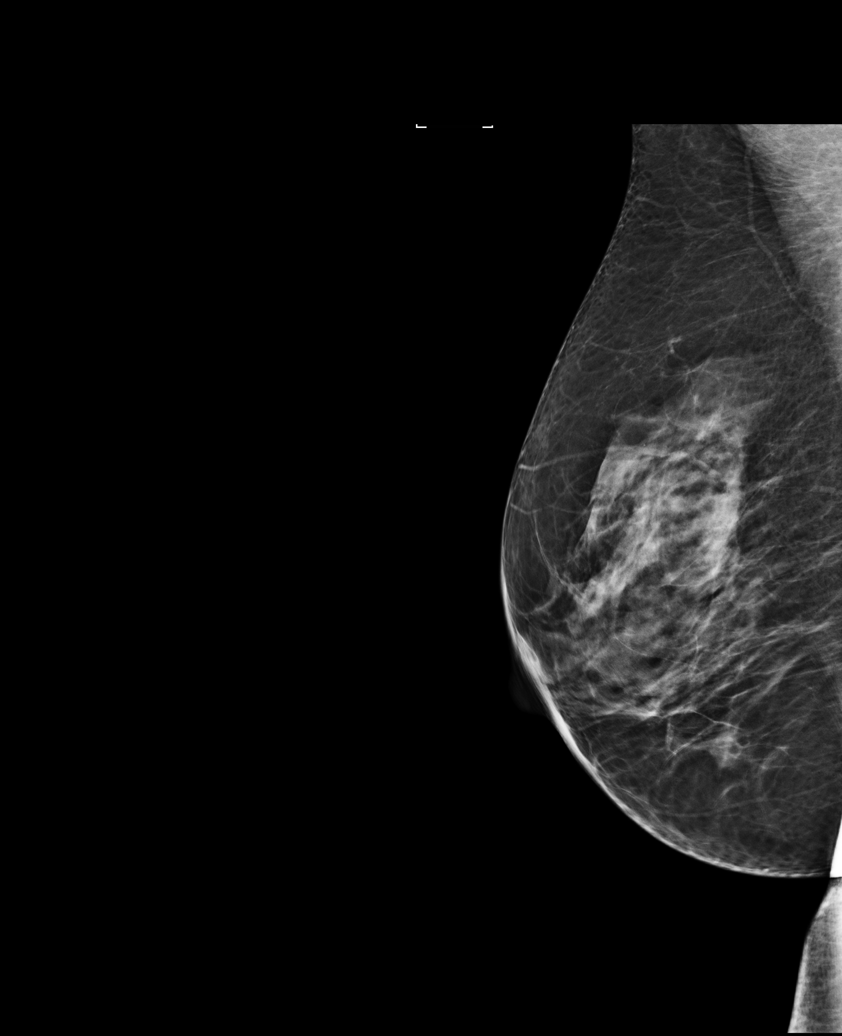

[L MLO (1 of 2)]
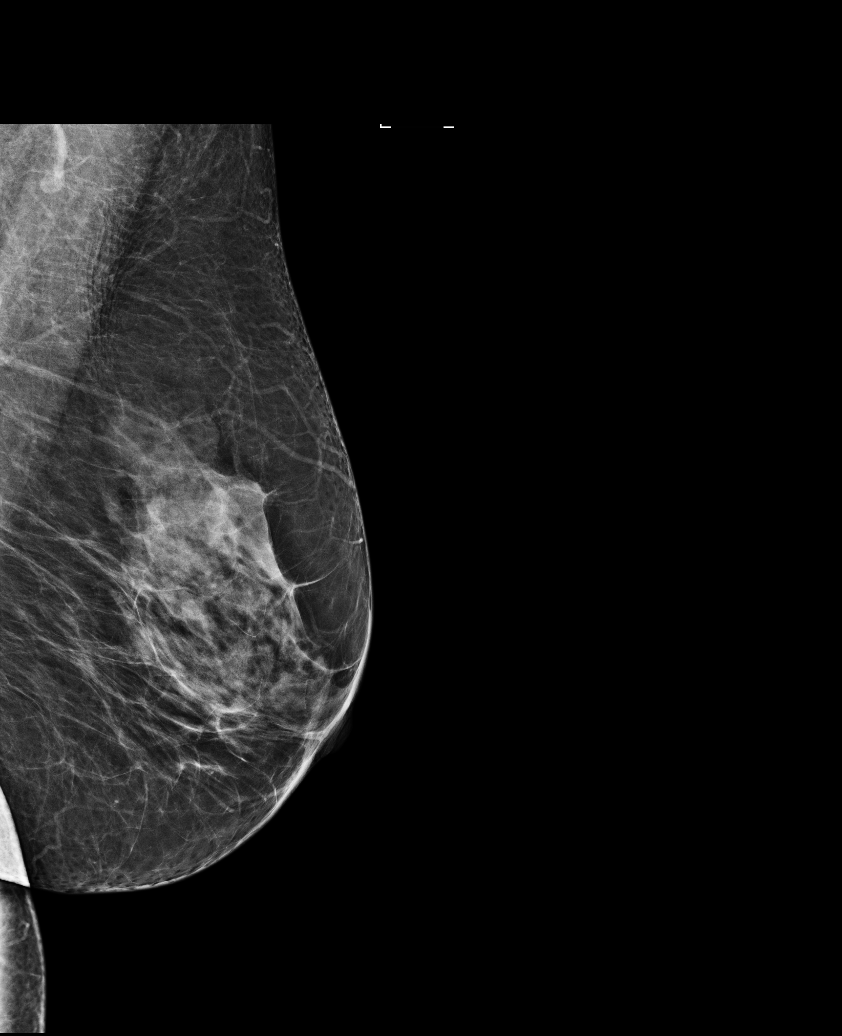

[L MLO (2 of 2)]
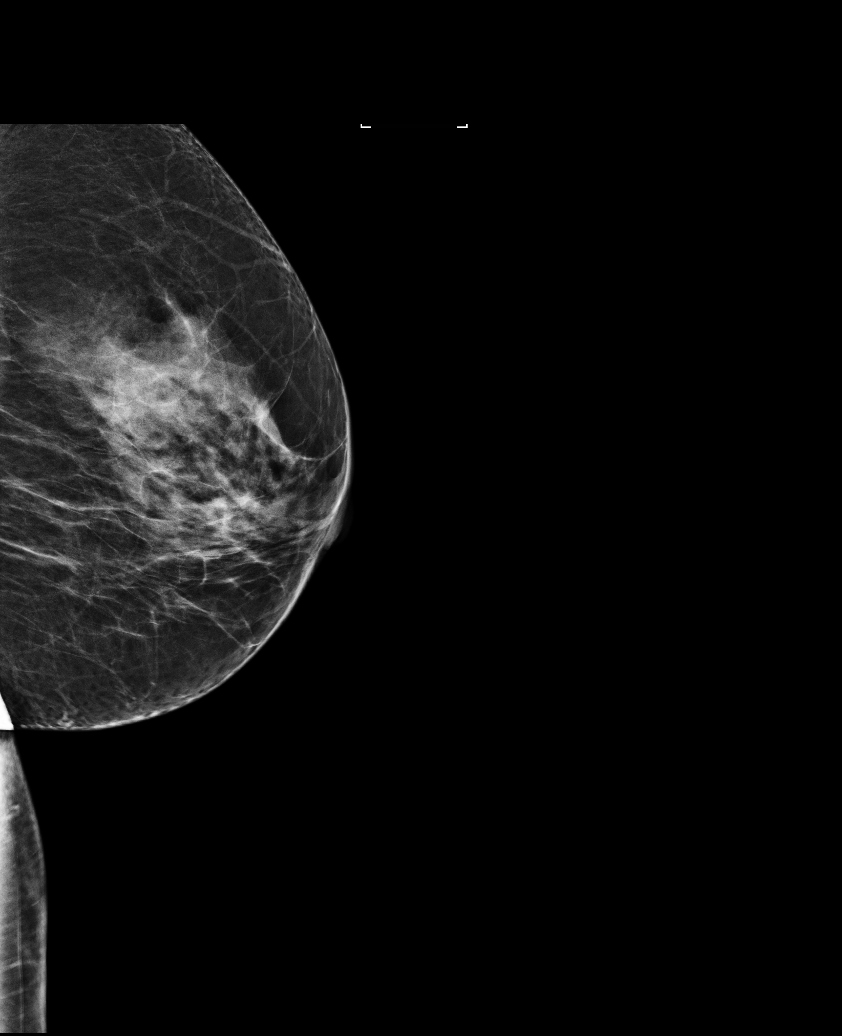

[L CC]
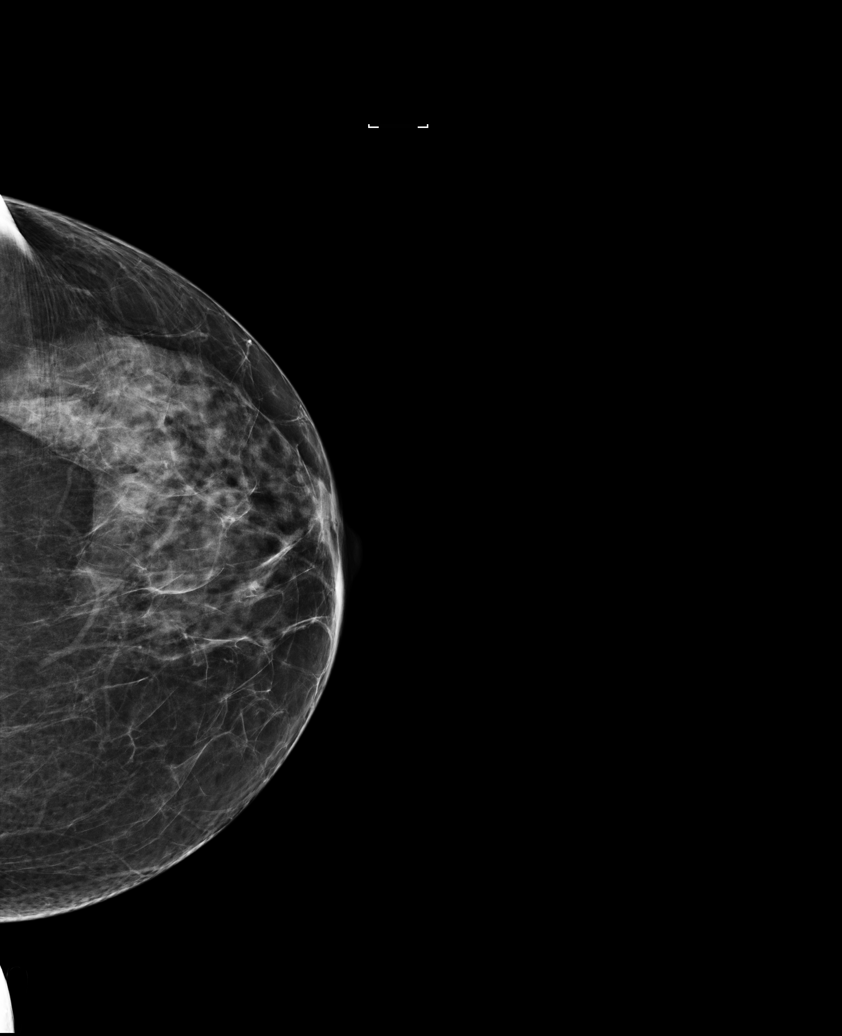

[R CC]
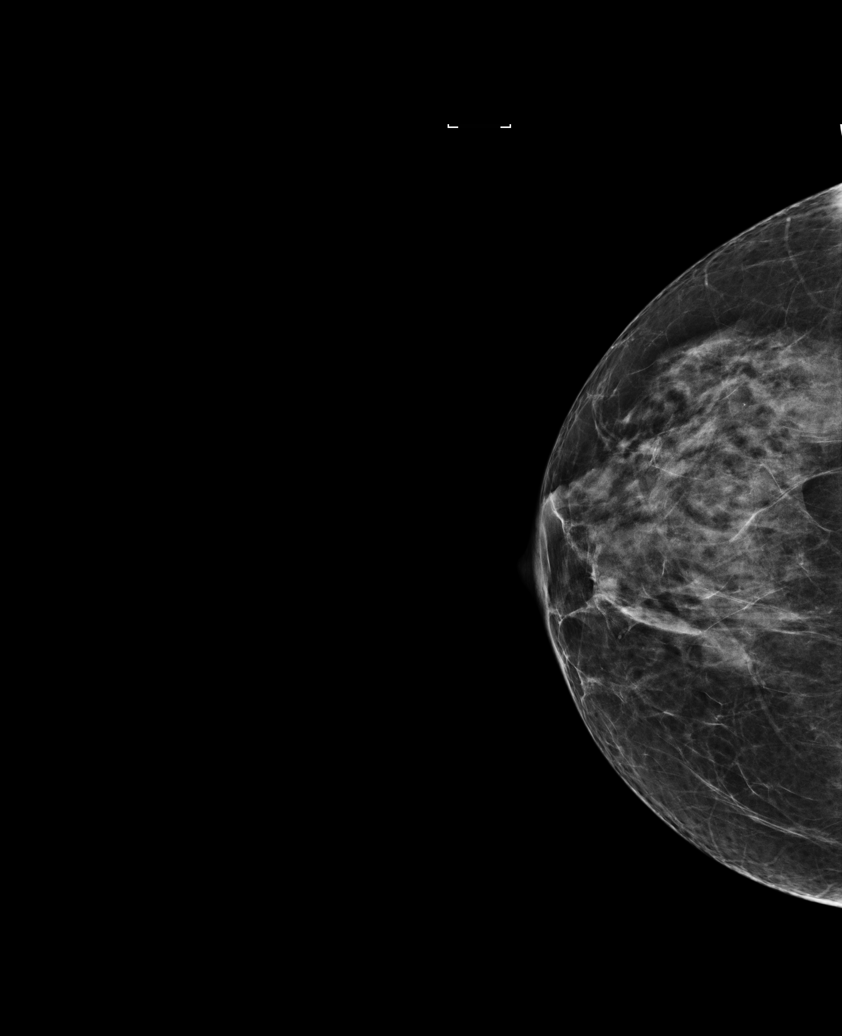

[5 of 5 positions shown; findings below may reference images not displayed]

ACR Breast Density Category c: The breast tissue is heterogeneously
dense, which may obscure small masses.
FINDINGS: There are no findings suspicious for malignancy. Images were
processed with CAD.
IMPRESSION: No mammographic evidence of malignancy. A result letter of this
screening mammogram will be mailed directly to the patient.

RECOMMENDATION:
Screening mammogram in one year. (Code:[0J])

BI-RADS CATEGORY  1: Negative.

## 2015-01-01 ENCOUNTER — Ambulatory Visit (AMBULATORY_SURGERY_CENTER): Payer: Self-pay

## 2015-01-01 VITALS — Ht 67.0 in | Wt 155.8 lb

## 2015-01-01 DIAGNOSIS — Z1211 Encounter for screening for malignant neoplasm of colon: Secondary | ICD-10-CM

## 2015-01-01 NOTE — Progress Notes (Signed)
Per pt, no allergies to soy or egg products.Pt not taking any weight loss meds or using  O2 at home. 

## 2015-01-15 ENCOUNTER — Encounter: Payer: Self-pay | Admitting: Internal Medicine

## 2015-01-15 ENCOUNTER — Ambulatory Visit (AMBULATORY_SURGERY_CENTER): Payer: BLUE CROSS/BLUE SHIELD | Admitting: Internal Medicine

## 2015-01-15 VITALS — BP 124/73 | HR 63 | Temp 97.4°F | Resp 15 | Ht 67.0 in | Wt 155.0 lb

## 2015-01-15 DIAGNOSIS — Z1211 Encounter for screening for malignant neoplasm of colon: Secondary | ICD-10-CM | POA: Diagnosis not present

## 2015-01-15 DIAGNOSIS — D125 Benign neoplasm of sigmoid colon: Secondary | ICD-10-CM | POA: Diagnosis not present

## 2015-01-15 DIAGNOSIS — D123 Benign neoplasm of transverse colon: Secondary | ICD-10-CM

## 2015-01-15 MED ORDER — SODIUM CHLORIDE 0.9 % IV SOLN: 500.0000 mL | INTRAVENOUS | Status: DC

## 2015-01-15 MED ORDER — FLEET ENEMA 7-19 GM/118ML RE ENEM
1.0000 | ENEMA | Freq: Once | RECTAL | Status: AC
  Administered 2015-01-15: 1 via RECTAL

## 2015-01-15 NOTE — Progress Notes (Signed)
Patient awakening,vss,report to rn 

## 2015-01-15 NOTE — Progress Notes (Signed)
Called to room to assist during endoscopic procedure.  Patient ID and intended procedure confirmed with present staff. Received instructions for my participation in the procedure from the performing physician.  

## 2015-01-15 NOTE — Op Note (Signed)
[place]  [address] [place], [id number]   COLONOSCOPY PROCEDURE REPORT  PATIENT: Delena, Casebeer  MR#: [id number] BIRTHDATE: [birth year] , 63  yrs. old GENDER: female ENDOSCOPIST: Gatha Mayer, MD, [facility] PROCEDURE DATE:  01/15/2015 PROCEDURE:   Colonoscopy, screening, Colonoscopy with biopsy, and Colonoscopy with snare polypectomy First Screening Colonoscopy - Avg.  risk and is 50 yrs.  old or older Yes.  Prior Negative Screening - Now for repeat screening. N/A  History of Adenoma - Now for follow-up colonoscopy & has been > or = to 3 yrs.  N/A History of Adenoma - Now for follow-up colonoscopy & has been > or = to 3 yrs. ASA CLASS:   Class II INDICATIONS:Screening for colonic neoplasia and Colorectal Neoplasm Risk Assessment for this procedure is average risk. MEDICATIONS: Propofol 200 mg IV and Monitored anesthesia care  DESCRIPTION OF PROCEDURE:   After the risks benefits and alternatives of the procedure were thoroughly explained, informed consent was obtained.  The digital rectal exam revealed no abnormalities of the rectum.   The [id number]  endoscope was introduced through the anus and advanced to the cecum, which was identified by both the appendix and ileocecal valve. No adverse events experienced.   The quality of the prep was adequate (MiraLax was used)  The instrument was then slowly withdrawn as the colon was fully examined.      COLON FINDINGS: Two sessile polyps ranging from 2 to 30mm in size were found in the transverse colon and sigmoid colon. Polypectomies were performed with a cold snare (5 mm sigmoid polyp) and with cold forceps (53mm transverse polyp).  The resection was complete, the polyp tissue was completely retrieved and sent to histology.   The examination was otherwise normal.  Retroflexed views revealed no abnormalities. The time to cecum = 4.0 Withdrawal time = 14.0   The scope was withdrawn and the procedure  completed. COMPLICATIONS: There were no immediate complications.  ENDOSCOPIC IMPRESSION: 1.   Two sessile polyps ranging from 2 to 46mm in size were found in the transverse colon and sigmoid colon; polypectomies were performed with a cold snare and with cold forceps 2.   The examination was otherwise normal - adequate prep  RECOMMENDATIONS: Timing of repeat colonoscopy will be determined by pathology findings.  eSigned:  Gatha Mayer, MD, [facility] 01/15/2015 11:59 AM   cc: The Patient and Dr. Cathlean Cower

## 2015-01-15 NOTE — Progress Notes (Signed)
Pt reports brown mushy stool, reported to Dr. Carlean Purl, fleet enema prescribed, pt given enema but unable to retain it, results were brown pieces of stool, advised dr Carlean Purl, Dr. Carlean Purl assessed abdomin and did rectal exam, pt was cramping and complaining of lower abdominal pain, during admitting pt was poor historian and changed her story several times, after his assessment Dr. Carlean Purl ok to continue with procedure.-adm

## 2015-01-15 NOTE — Patient Instructions (Addendum)
I found and removed two small polyps that look benign.  I will let you know pathology results and when to have another routine colonoscopy by mail.  I appreciate the opportunity to care for you. Gatha Mayer, MD, FACG  YOU HAD AN ENDOSCOPIC PROCEDURE TODAY AT [facility]:   Refer to the procedure report that was given to you for any specific questions about what was found during the examination.  If the procedure report does not answer your questions, please call your gastroenterologist to clarify.  If you requested that your care partner not be given the details of your procedure findings, then the procedure report has been included in a sealed envelope for you to review at your convenience later.  YOU SHOULD EXPECT: Some feelings of bloating in the abdomen. Passage of more gas than usual.  Walking can help get rid of the air that was put into your GI tract during the procedure and reduce the bloating. If you had a lower endoscopy (such as a colonoscopy or flexible sigmoidoscopy) you may notice spotting of blood in your stool or on the toilet paper. If you underwent a bowel prep for your procedure, you may not have a normal bowel movement for a few days.  Please Note:  You might notice some irritation and congestion in your nose or some drainage.  This is from the oxygen used during your procedure.  There is no need for concern and it should clear up in a day or so.  SYMPTOMS TO REPORT IMMEDIATELY:   Following lower endoscopy (colonoscopy or flexible sigmoidoscopy):  Excessive amounts of blood in the stool  Significant tenderness or worsening of abdominal pains  Swelling of the abdomen that is new, acute  Fever of 100F or higher  For urgent or emergent issues, a gastroenterologist can be reached at any hour by calling [phone].   DIET: Your first meal following the procedure should be a small meal and then it is ok to progress to your normal diet. Heavy  or fried foods are harder to digest and may make you feel nauseous or bloated.  Likewise, meals heavy in dairy and vegetables can increase bloating.  Drink plenty of fluids but you should avoid alcoholic beverages for 24 hours.  ACTIVITY:  You should plan to take it easy for the rest of today and you should NOT DRIVE or use heavy machinery until tomorrow (because of the sedation medicines used during the test).    FOLLOW UP: Our staff will call the number listed on your records the next business day following your procedure to check on you and address any questions or concerns that you may have regarding the information given to you following your procedure. If we do not reach you, we will leave a message.  However, if you are feeling well and you are not experiencing any problems, there is no need to return our call.  We will assume that you have returned to your regular daily activities without incident.  If any biopsies were taken you will be contacted by phone or by letter within the next 1-3 weeks.  Please call us at [phone] if you have not heard about the biopsies in 3 weeks.    SIGNATURES/CONFIDENTIALITY: You and/or your care partner have signed paperwork which will be entered into your electronic medical record.  These signatures attest to the fact that that the information above on your After Visit Summary has been reviewed and is  understood.  Full responsibility of the confidentiality of this discharge information lies with you and/or your care-partner.  Polyp information given.

## 2015-01-16 ENCOUNTER — Telehealth: Payer: Self-pay

## 2015-01-16 NOTE — Telephone Encounter (Signed)
  Follow up Call-  Call back number 01/15/2015  Post procedure Call Back phone  # [phone]  Permission to leave phone message Yes     Patient questions:  Do you have a fever, pain , or abdominal swelling? No. Pain Score  0 *  Have you tolerated food without any problems? Yes.    Have you been able to return to your normal activities? Yes.    Do you have any questions about your discharge instructions: Diet   No. Medications  No. Follow up visit  No.  Do you have questions or concerns about your Care? No.  Actions: * If pain score is 4 or above: No action needed, pain <4.

## 2015-01-26 ENCOUNTER — Encounter: Payer: Self-pay | Admitting: Internal Medicine

## 2015-01-26 DIAGNOSIS — Z86010 Personal history of colonic polyps: Secondary | ICD-10-CM

## 2015-01-26 DIAGNOSIS — Z860101 Personal history of adenomatous and serrated colon polyps: Secondary | ICD-10-CM | POA: Insufficient documentation

## 2015-01-26 HISTORY — DX: Personal history of colonic polyps: Z86.010

## 2015-01-26 HISTORY — DX: Personal history of adenomatous and serrated colon polyps: Z86.0101

## 2015-01-26 NOTE — Progress Notes (Signed)
Quick Note:  2 adenomas (< 1 cm) Repeat colonoscopy in 2021 ______

## 2015-02-20 ENCOUNTER — Ambulatory Visit: Payer: BC Managed Care – PPO | Admitting: Internal Medicine

## 2015-02-20 DIAGNOSIS — Z0289 Encounter for other administrative examinations: Secondary | ICD-10-CM

## 2015-08-09 ENCOUNTER — Ambulatory Visit (INDEPENDENT_AMBULATORY_CARE_PROVIDER_SITE_OTHER): Payer: BLUE CROSS/BLUE SHIELD | Admitting: Internal Medicine

## 2015-08-09 ENCOUNTER — Encounter: Payer: Self-pay | Admitting: Internal Medicine

## 2015-08-09 VITALS — BP 160/96 | HR 88 | Temp 98.0°F | Ht 65.0 in | Wt 153.0 lb

## 2015-08-09 DIAGNOSIS — M79605 Pain in left leg: Secondary | ICD-10-CM | POA: Insufficient documentation

## 2015-08-09 MED ORDER — OXYCODONE HCL 5 MG PO TABS: ORAL_TABLET | ORAL | Status: DC

## 2015-08-09 MED ORDER — MEPERIDINE HCL 50 MG/ML IJ SOLN
50.0000 mg | Freq: Once | INTRAMUSCULAR | Status: AC
  Administered 2015-08-09: 50 mg via INTRAMUSCULAR

## 2015-08-09 MED ORDER — PROMETHAZINE HCL 25 MG/ML IJ SOLN
25.0000 mg | Freq: Once | INTRAMUSCULAR | Status: AC
  Administered 2015-08-09: 25 mg via INTRAMUSCULAR

## 2015-08-09 NOTE — Patient Instructions (Signed)
You had the pain shot today (demerol/phenergan)  Please take all new medication as prescribed - the oxycodone  Please continue all other medications as before, and refills have been done if requested.  Please have the pharmacy call with any other refills you may need.  Please continue your efforts at being more active, low cholesterol diet, and weight control.  Please keep your appointments with your specialists as you may have planned  Please see Dustin at the scheduling desk for an Appt with Dr Smith/sports medicine for Monday Nov 14

## 2015-08-09 NOTE — Assessment & Plan Note (Signed)
Severe, suspect possible hamstring partial tear or rupture, for demerol/phenergan 50.25 mg now, for oxycodone prn, also for urgent appt with sport med asap
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
## 2015-08-09 NOTE — Progress Notes (Signed)
Pre visit review using our clinic review tool, if applicable. No additional management support is needed unless otherwise documented below in the visit note. 

## 2015-08-09 NOTE — Addendum Note (Signed)
Addended by: Terence Lux B on: 08/09/2015 04:31 PM   Modules accepted: Orders

## 2015-08-29 ENCOUNTER — Telehealth: Payer: Self-pay | Admitting: *Deleted

## 2015-08-29 MED ORDER — ATORVASTATIN CALCIUM 10 MG PO TABS: 10.0000 mg | ORAL_TABLET | Freq: Every day | ORAL | Status: DC

## 2015-08-29 NOTE — Telephone Encounter (Signed)
Pharmacy [facility] fax over refill for atorvastatin. Never got a response. Pt is die for cpx sent 30 day until appt...Johny Chess

## 2015-09-24 ENCOUNTER — Other Ambulatory Visit: Payer: Self-pay | Admitting: Internal Medicine

## 2015-09-25 ENCOUNTER — Emergency Department (HOSPITAL_COMMUNITY)
Admission: EM | Admit: 2015-09-25 | Discharge: 2015-09-25 | Disposition: A | Discharge status: Home / Self Care | Payer: BLUE CROSS/BLUE SHIELD | Attending: Emergency Medicine | Admitting: Emergency Medicine

## 2015-09-25 ENCOUNTER — Emergency Department (HOSPITAL_COMMUNITY): Payer: BLUE CROSS/BLUE SHIELD

## 2015-09-25 ENCOUNTER — Encounter (HOSPITAL_COMMUNITY): Payer: Self-pay | Admitting: Emergency Medicine

## 2015-09-25 DIAGNOSIS — W19XXXA Unspecified fall, initial encounter: Secondary | ICD-10-CM

## 2015-09-25 DIAGNOSIS — M545 Low back pain: Secondary | ICD-10-CM | POA: Insufficient documentation

## 2015-09-25 DIAGNOSIS — Z86010 Personal history of colonic polyps: Secondary | ICD-10-CM | POA: Insufficient documentation

## 2015-09-25 DIAGNOSIS — E785 Hyperlipidemia, unspecified: Secondary | ICD-10-CM | POA: Insufficient documentation

## 2015-09-25 DIAGNOSIS — Z79899 Other long term (current) drug therapy: Secondary | ICD-10-CM | POA: Diagnosis not present

## 2015-09-25 DIAGNOSIS — M25562 Pain in left knee: Secondary | ICD-10-CM | POA: Insufficient documentation

## 2015-09-25 DIAGNOSIS — Z8659 Personal history of other mental and behavioral disorders: Secondary | ICD-10-CM | POA: Diagnosis not present

## 2015-09-25 DIAGNOSIS — M25552 Pain in left hip: Secondary | ICD-10-CM | POA: Insufficient documentation

## 2015-09-25 DIAGNOSIS — M79605 Pain in left leg: Secondary | ICD-10-CM | POA: Diagnosis present
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
## 2015-09-25 MED ORDER — METHOCARBAMOL 500 MG PO TABS: 500.0000 mg | ORAL_TABLET | Freq: Four times a day (QID) | ORAL | Status: DC
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
## 2015-09-25 NOTE — ED Notes (Signed)
Patient transported to X-ray 

## 2015-09-25 NOTE — Discharge Instructions (Signed)
Please read and follow all provided instructions.  Your diagnoses today include:  1. Left leg pain   2. Fall     Tests performed today include:  An x-ray of the affected area - does NOT show any broken bones  Vital signs. See below for your results today.   Medications prescribed:   Robaxin (methocarbamol) - muscle relaxer medication  DO NOT drive or perform any activities that require you to be awake and alert because this medicine can make you drowsy.   Take any prescribed medications only as directed.  Home care instructions:   Follow any educational materials contained in this packet  Follow R.I.C.E. Protocol:  R - rest your injury   I  - use ice on injury without applying directly to skin  C - compress injury with bandage or splint  E - elevate the injury as much as possible  Follow-up instructions: Please follow-up with your primary care provider if you continue to have significant pain in 1 week. In this case you may have a more severe injury that requires further care.   Return instructions:   Please return if your toes or feet are numb or tingling, appear gray or blue, or you have severe pain (also elevate the leg and loosen splint or wrap if you were given one)  Please return to the Emergency Department if you experience worsening symptoms.   Please return if you have any other emergent concerns.  Additional Information:  Your vital signs today were: BP 125/61 mmHg   Pulse 64   Temp(Src) 98.3 F (36.8 C) (Oral)   Resp 18   SpO2 100% If your blood pressure (BP) was elevated above 135/85 this visit, please have this repeated by your doctor within one month. --------------

## 2015-10-10 ENCOUNTER — Other Ambulatory Visit: Payer: Self-pay

## 2015-10-10 DIAGNOSIS — Z1231 Encounter for screening mammogram for malignant neoplasm of breast: Secondary | ICD-10-CM

## 2015-11-15 ENCOUNTER — Ambulatory Visit: Payer: BLUE CROSS/BLUE SHIELD

## 2015-11-29 ENCOUNTER — Ambulatory Visit
Admission: RE | Admit: 2015-11-29 | Discharge: 2015-11-29 | Disposition: A | Discharge status: Home / Self Care | Payer: BLUE CROSS/BLUE SHIELD | Source: Ambulatory Visit

## 2015-11-29 DIAGNOSIS — Z1231 Encounter for screening mammogram for malignant neoplasm of breast: Secondary | ICD-10-CM

## 2015-11-29 IMAGING — MG MM SCREEN MAMMOGRAM BILATERAL
5 series · 5 of 5 positions shown · non-contrast
Comparison: Previous exam(s).

CLINICAL DATA: Screening.

EXAM:
DIGITAL SCREENING BILATERAL MAMMOGRAM WITH CAD

[R CC]
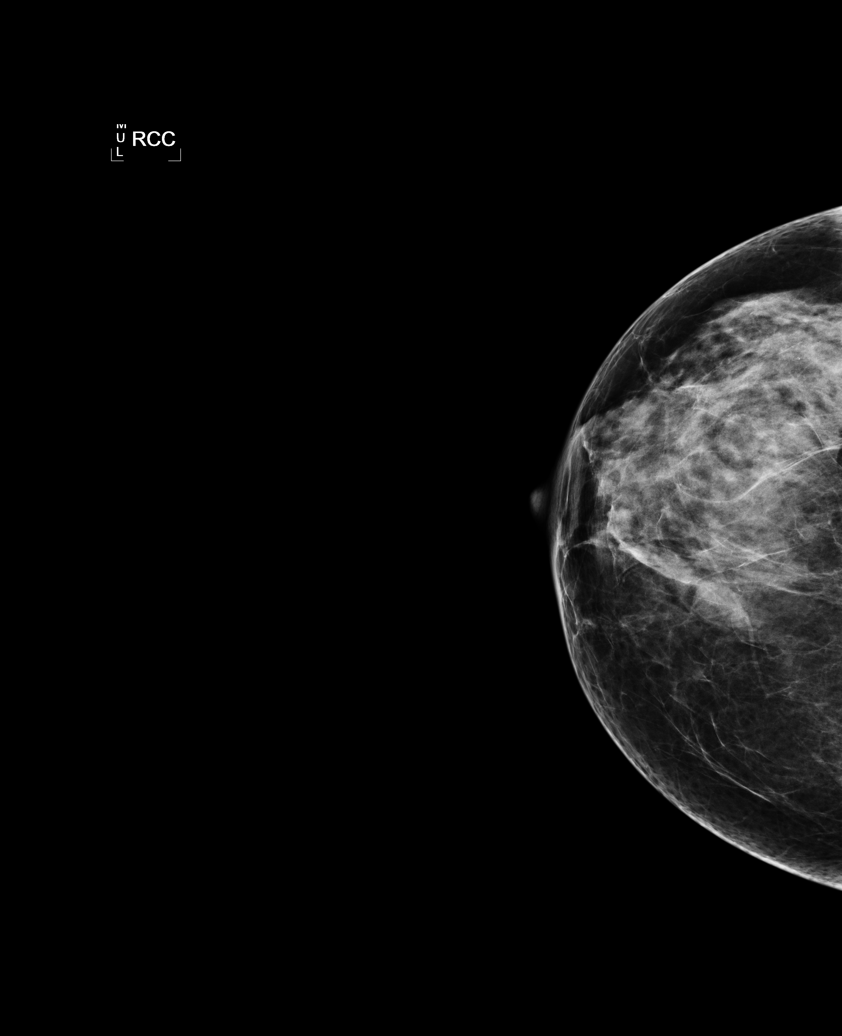

[L CC]
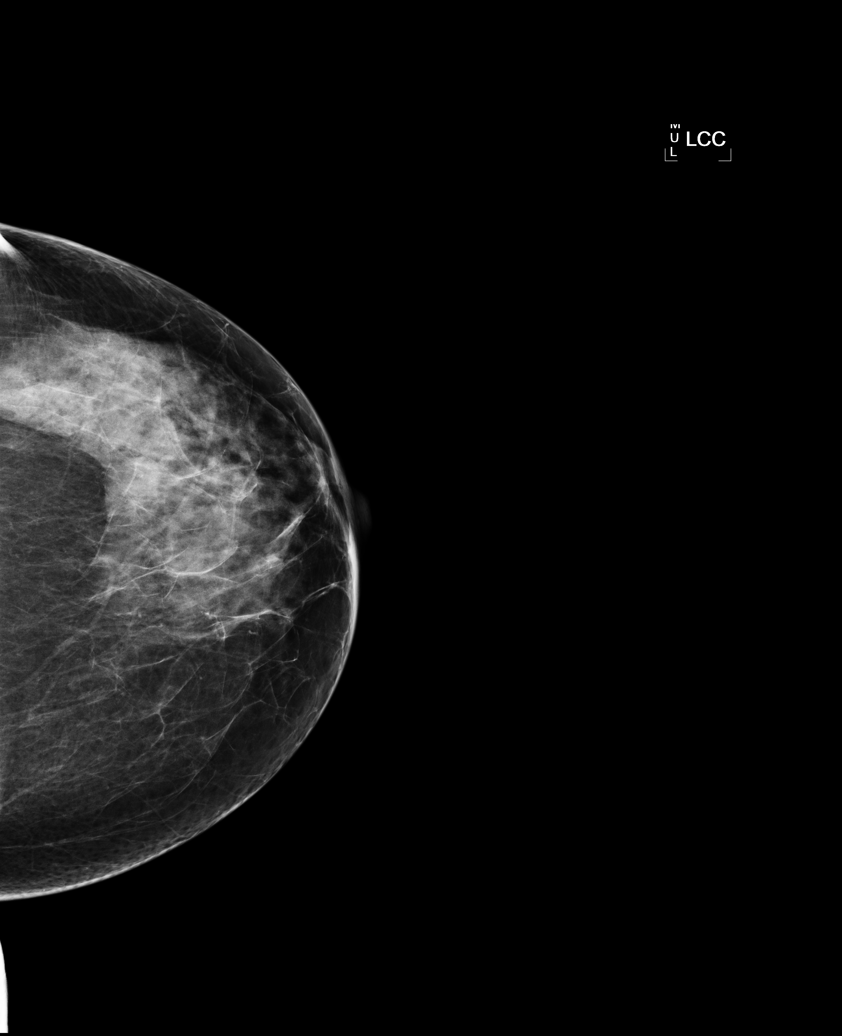

[L MLO]
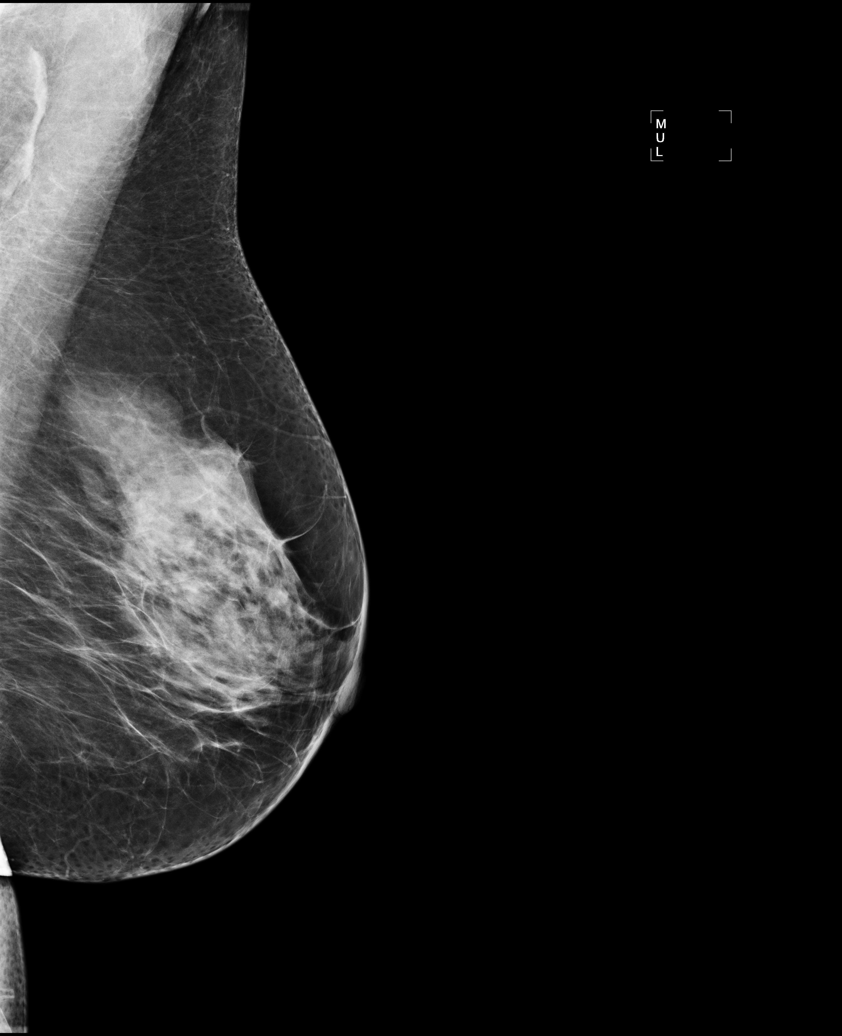

[R MLO (1 of 2)]
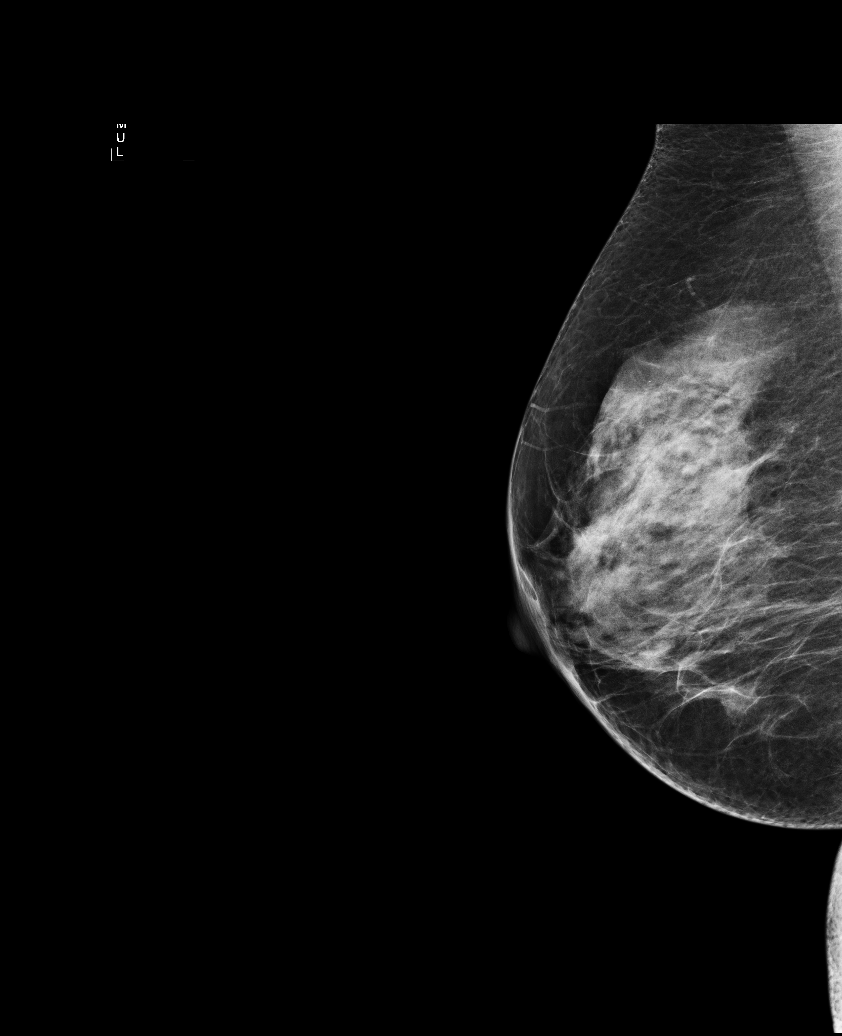

[R MLO (2 of 2)]
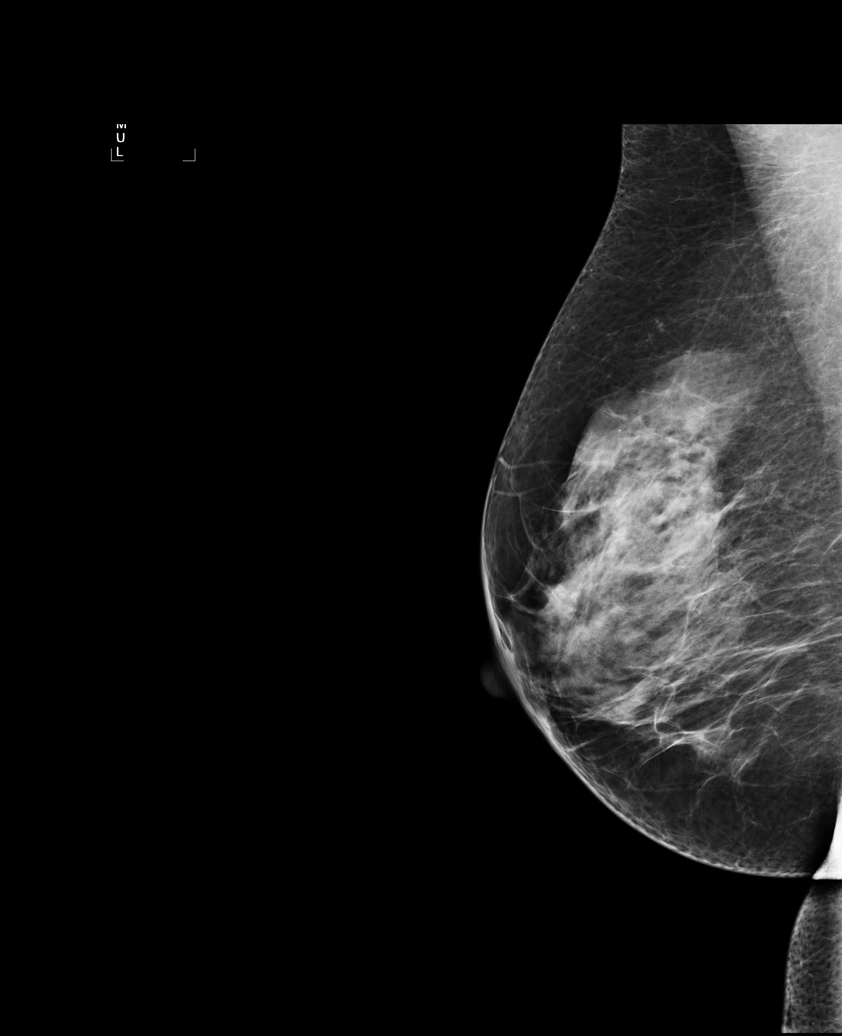

[5 of 5 positions shown; findings below may reference images not displayed]

ACR Breast Density Category c: The breast tissue is heterogeneously
dense, which may obscure small masses.
FINDINGS: There are no findings suspicious for malignancy. Images were
processed with CAD.
IMPRESSION: No mammographic evidence of malignancy. A result letter of this
screening mammogram will be mailed directly to the patient.

RECOMMENDATION:
Screening mammogram in one year. (Code:[0J])

BI-RADS CATEGORY  1: Negative.

## 2015-12-26 ENCOUNTER — Telehealth: Payer: Self-pay | Admitting: Internal Medicine

## 2015-12-26 NOTE — Telephone Encounter (Signed)
Tried to call pt, but phone number is disconnected.

## 2016-03-24 ENCOUNTER — Ambulatory Visit (INDEPENDENT_AMBULATORY_CARE_PROVIDER_SITE_OTHER): Payer: BLUE CROSS/BLUE SHIELD | Admitting: Internal Medicine

## 2016-03-24 ENCOUNTER — Encounter: Payer: Self-pay | Admitting: Internal Medicine

## 2016-03-24 ENCOUNTER — Ambulatory Visit (INDEPENDENT_AMBULATORY_CARE_PROVIDER_SITE_OTHER)
Admission: RE | Admit: 2016-03-24 | Discharge: 2016-03-24 | Disposition: A | Discharge status: Home / Self Care | Payer: BLUE CROSS/BLUE SHIELD | Source: Ambulatory Visit | Attending: Internal Medicine | Admitting: Internal Medicine

## 2016-03-24 ENCOUNTER — Other Ambulatory Visit (INDEPENDENT_AMBULATORY_CARE_PROVIDER_SITE_OTHER): Payer: BLUE CROSS/BLUE SHIELD

## 2016-03-24 VITALS — BP 136/86 | HR 81 | Temp 98.2°F | Resp 20 | Wt 154.0 lb

## 2016-03-24 DIAGNOSIS — Z0001 Encounter for general adult medical examination with abnormal findings: Secondary | ICD-10-CM | POA: Diagnosis not present

## 2016-03-24 DIAGNOSIS — Z1159 Encounter for screening for other viral diseases: Secondary | ICD-10-CM

## 2016-03-24 DIAGNOSIS — M79605 Pain in left leg: Secondary | ICD-10-CM

## 2016-03-24 DIAGNOSIS — E785 Hyperlipidemia, unspecified: Secondary | ICD-10-CM | POA: Diagnosis not present

## 2016-03-24 DIAGNOSIS — E559 Vitamin D deficiency, unspecified: Secondary | ICD-10-CM | POA: Diagnosis not present

## 2016-03-24 DIAGNOSIS — M25552 Pain in left hip: Secondary | ICD-10-CM

## 2016-03-24 DIAGNOSIS — R6889 Other general symptoms and signs: Secondary | ICD-10-CM | POA: Diagnosis not present

## 2016-03-24 DIAGNOSIS — F329 Major depressive disorder, single episode, unspecified: Secondary | ICD-10-CM | POA: Diagnosis not present

## 2016-03-24 DIAGNOSIS — F32A Depression, unspecified: Secondary | ICD-10-CM

## 2016-03-24 LAB — URINALYSIS, ROUTINE W REFLEX MICROSCOPIC
Bilirubin Urine: NEGATIVE
Ketones, ur: NEGATIVE
Leukocytes, UA: NEGATIVE
Nitrite: POSITIVE — AB
Specific Gravity, Urine: 1.025 (ref 1.000–1.030)
Total Protein, Urine: NEGATIVE
Urine Glucose: NEGATIVE
Urobilinogen, UA: 0.2 (ref 0.0–1.0)
pH: 5.5 (ref 5.0–8.0)

## 2016-03-24 LAB — BASIC METABOLIC PANEL
BUN: 11 mg/dL (ref 6–23)
CO2: 31 mEq/L (ref 19–32)
Calcium: 9.4 mg/dL (ref 8.4–10.5)
Chloride: 107 mEq/L (ref 96–112)
Creatinine, Ser: 0.76 mg/dL (ref 0.40–1.20)
GFR: 81.65 mL/min (ref >60.00)
Glucose, Bld: 94 mg/dL (ref 70–99)
Potassium: 3.9 mEq/L (ref 3.5–5.1)
Sodium: 142 mEq/L (ref 135–145)

## 2016-03-24 LAB — LIPID PANEL
Cholesterol: 215 mg/dL — ABNORMAL HIGH (ref 0–200)
HDL: 47 mg/dL (ref >39.00)
LDL Cholesterol: 144 mg/dL — ABNORMAL HIGH (ref 0–99)
NonHDL: 168.12
Total CHOL/HDL Ratio: 5
Triglycerides: 119 mg/dL (ref 0.0–149.0)
VLDL: 23.8 mg/dL (ref 0.0–40.0)

## 2016-03-24 LAB — CBC WITH DIFFERENTIAL/PLATELET
Basophils Absolute: 0 10*3/uL (ref 0.0–0.1)
Basophils Relative: 0.7 % (ref 0.0–3.0)
Eosinophils Absolute: 0.3 10*3/uL (ref 0.0–0.7)
Eosinophils Relative: 4.7 % (ref 0.0–5.0)
HCT: 39.2 % (ref 36.0–46.0)
Hemoglobin: 13 g/dL (ref 12.0–15.0)
Lymphocytes Relative: 39.2 % (ref 12.0–46.0)
Lymphs Abs: 2.8 10*3/uL (ref 0.7–4.0)
MCHC: 33.2 g/dL (ref 30.0–36.0)
MCV: 91.3 fl (ref 78.0–100.0)
Monocytes Absolute: 0.5 10*3/uL (ref 0.1–1.0)
Monocytes Relative: 7.3 % (ref 3.0–12.0)
Neutro Abs: 3.5 10*3/uL (ref 1.4–7.7)
Neutrophils Relative %: 48.1 % (ref 43.0–77.0)
Platelets: 273 10*3/uL (ref 150.0–400.0)
RBC: 4.29 Mil/uL (ref 3.87–5.11)
RDW: 14.1 % (ref 11.5–15.5)
WBC: 7.2 10*3/uL (ref 4.0–10.5)

## 2016-03-24 LAB — VITAMIN D 25 HYDROXY (VIT D DEFICIENCY, FRACTURES): VITD: 34.62 ng/mL (ref 30.00–100.00)

## 2016-03-24 LAB — HEPATIC FUNCTION PANEL
ALT: 9 U/L (ref 0–35)
AST: 13 U/L (ref 0–37)
Albumin: 4 g/dL (ref 3.5–5.2)
Alkaline Phosphatase: 100 U/L (ref 39–117)
Bilirubin, Direct: 0 mg/dL (ref 0.0–0.3)
Total Bilirubin: 0.3 mg/dL (ref 0.2–1.2)
Total Protein: 7.1 g/dL (ref 6.0–8.3)

## 2016-03-24 LAB — TSH: TSH: 1.29 u[IU]/mL (ref 0.35–4.50)
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
## 2016-03-24 MED ORDER — TRAMADOL HCL 50 MG PO TABS: 50.0000 mg | ORAL_TABLET | Freq: Three times a day (TID) | ORAL | Status: DC | PRN

## 2016-03-24 MED ORDER — TIZANIDINE HCL 4 MG PO TABS: 4.0000 mg | ORAL_TABLET | Freq: Four times a day (QID) | ORAL | Status: DC | PRN
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
## 2016-03-24 NOTE — Assessment & Plan Note (Signed)
?   Bipolar per son with her today, to cont psychiatry care

## 2016-03-24 NOTE — Progress Notes (Signed)
Pre visit review using our clinic review tool, if applicable. No additional management support is needed unless otherwise documented below in the visit note. 

## 2016-03-24 NOTE — Patient Instructions (Addendum)
Please take all new medication as prescribed - the pain medication, and muscle relaxer  Please continue all other medications as before, and refills have been done if requested.  Please have the pharmacy call with any other refills you may need.  Please continue your efforts at being more active, low cholesterol diet, and weight control.  You are otherwise up to date with prevention measures today.  Please keep your appointments with your specialists as you may have planned  You will be contacted regarding the referral for: Dr Tamala Julian in this office  Please go to the XRAY Department in the Basement (go straight as you get off the elevator) for the x-ray testing  Please go to the LAB in the Basement (turn left off the elevator) for the tests to be done today  You will be contacted by phone if any changes need to be made immediately.  Otherwise, you will receive a letter about your results with an explanation, but please check with MyChart first.  Please remember to sign up for MyChart if you have not done so, as this will be important to you in the future with finding out test results, communicating by private email, and scheduling acute appointments online when needed.  Please return in 6 months, or sooner if needed

## 2016-03-24 NOTE — Assessment & Plan Note (Addendum)
Does have significant pain to left hamstrings on hip flexion it seems, for pain control, left hip and pelvis films, refer sports medicine, muscle relaxer prn,  to f/u any worsening symptoms or concerns  In addition to the time spent performing CPE, I spent an additional 25 minutes face to face,in which greater than 50% of this time was spent in counseling and coordination of care for patient's acute illness as documented.

## 2016-03-24 NOTE — Assessment & Plan Note (Signed)
stable overall by history and exam, recent data reviewed with pt - LDL approx 140, for lower chol diet, she is not sure if wants to take statin, for f/u lab today, to f/u any worsening symptoms or concerns

## 2016-03-24 NOTE — Assessment & Plan Note (Signed)
Also for Vit D level,  to f/u any worsening symptoms or concerns

## 2016-03-24 NOTE — Assessment & Plan Note (Signed)

## 2016-03-25 ENCOUNTER — Encounter: Payer: Self-pay | Admitting: Internal Medicine

## 2016-03-25 LAB — HEPATITIS C ANTIBODY: HCV Ab: NEGATIVE

## 2016-10-28 ENCOUNTER — Other Ambulatory Visit: Payer: Self-pay | Admitting: Internal Medicine

## 2016-10-28 DIAGNOSIS — Z1231 Encounter for screening mammogram for malignant neoplasm of breast: Secondary | ICD-10-CM

## 2016-12-01 ENCOUNTER — Ambulatory Visit
Admission: RE | Admit: 2016-12-01 | Discharge: 2016-12-01 | Disposition: A | Discharge status: Home / Self Care | Payer: BLUE CROSS/BLUE SHIELD | Source: Ambulatory Visit | Attending: Internal Medicine | Admitting: Internal Medicine

## 2016-12-01 DIAGNOSIS — Z1231 Encounter for screening mammogram for malignant neoplasm of breast: Secondary | ICD-10-CM

## 2016-12-01 IMAGING — MG DIGITAL SCREENING BILATERAL MAMMOGRAM WITH CAD
4 series · 4 of 4 positions shown · non-contrast
Comparison: Previous exam(s).

CLINICAL DATA: Screening.

EXAM:
DIGITAL SCREENING BILATERAL MAMMOGRAM WITH CAD

[L MLO]
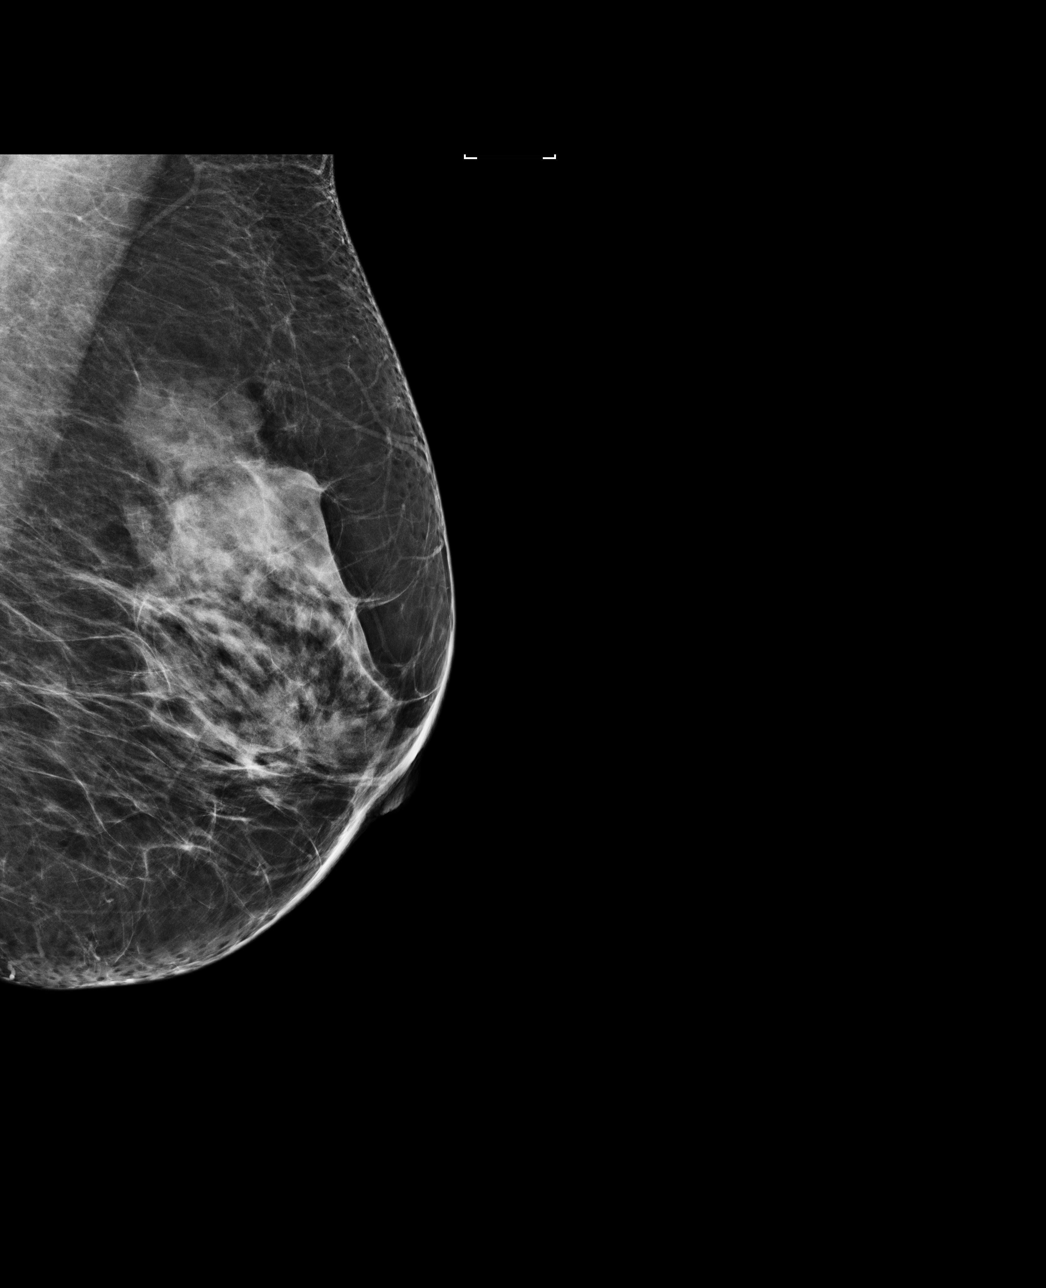

[R CC]
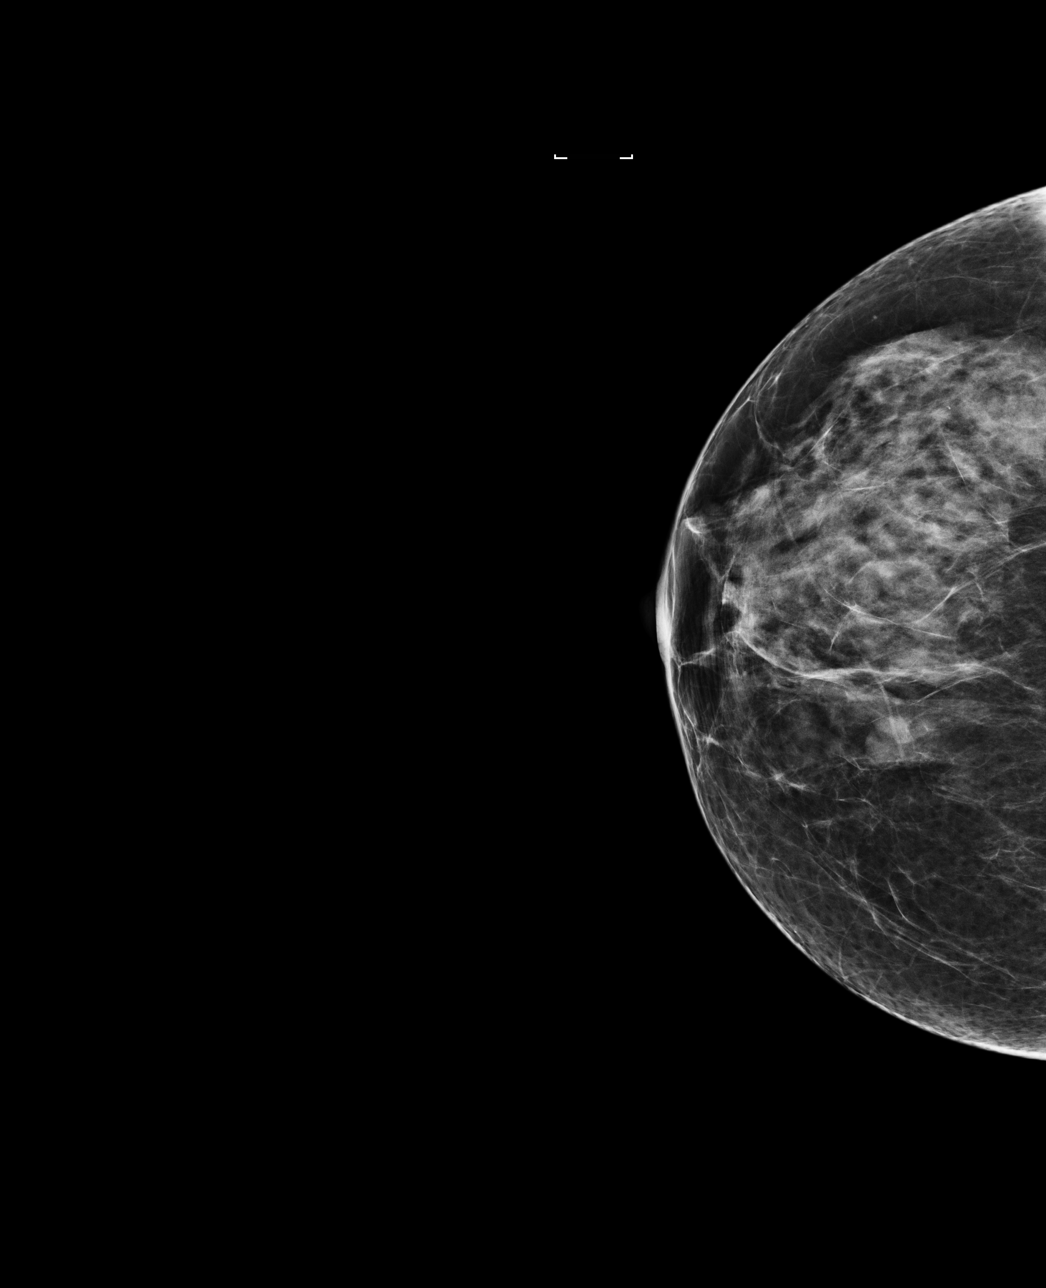

[L CC]
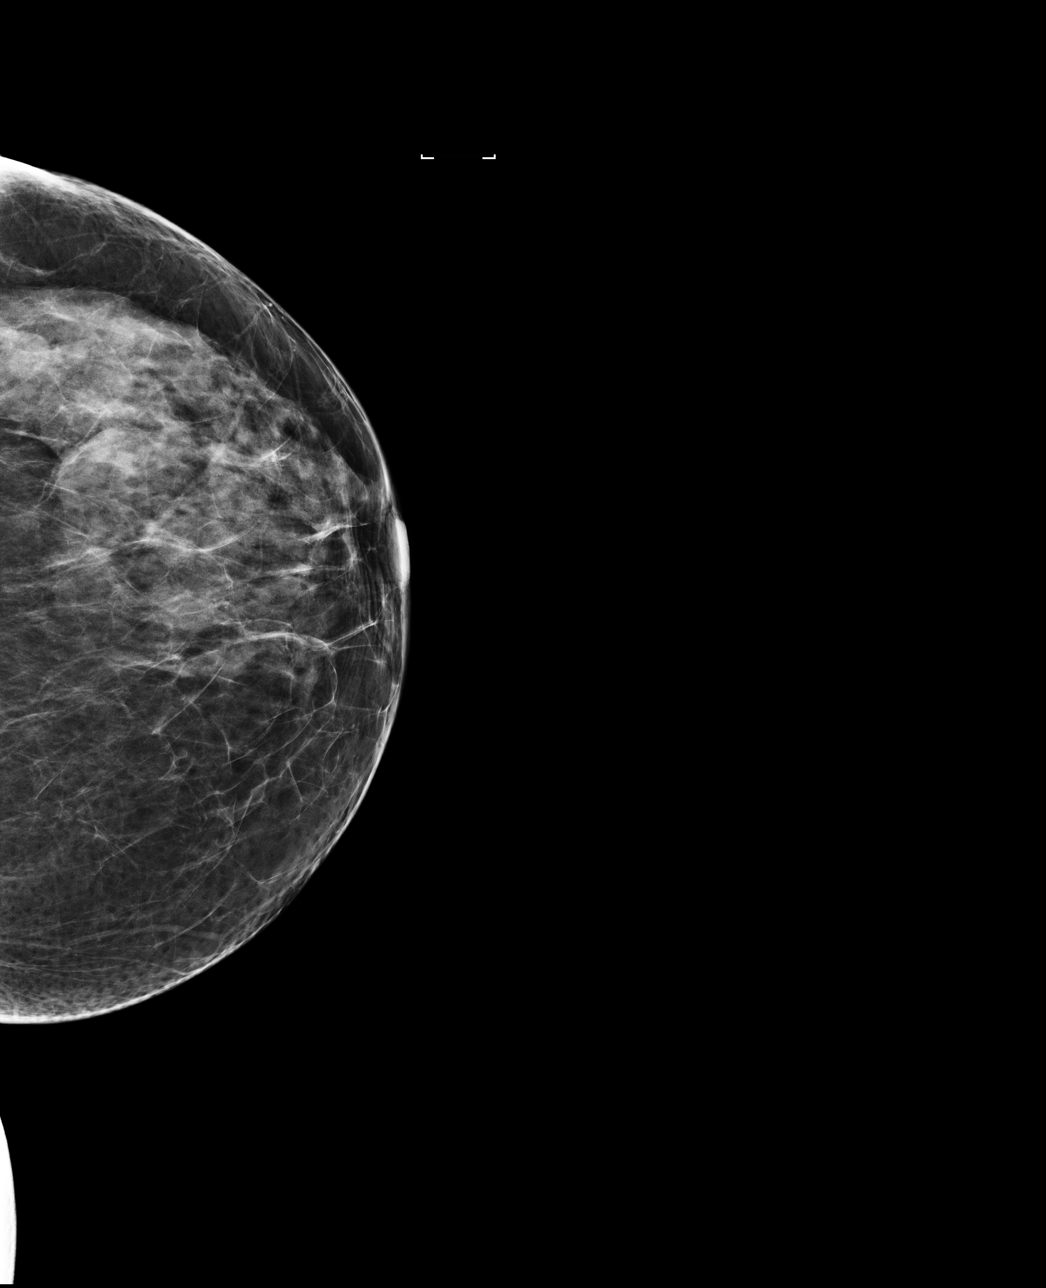

[R MLO]
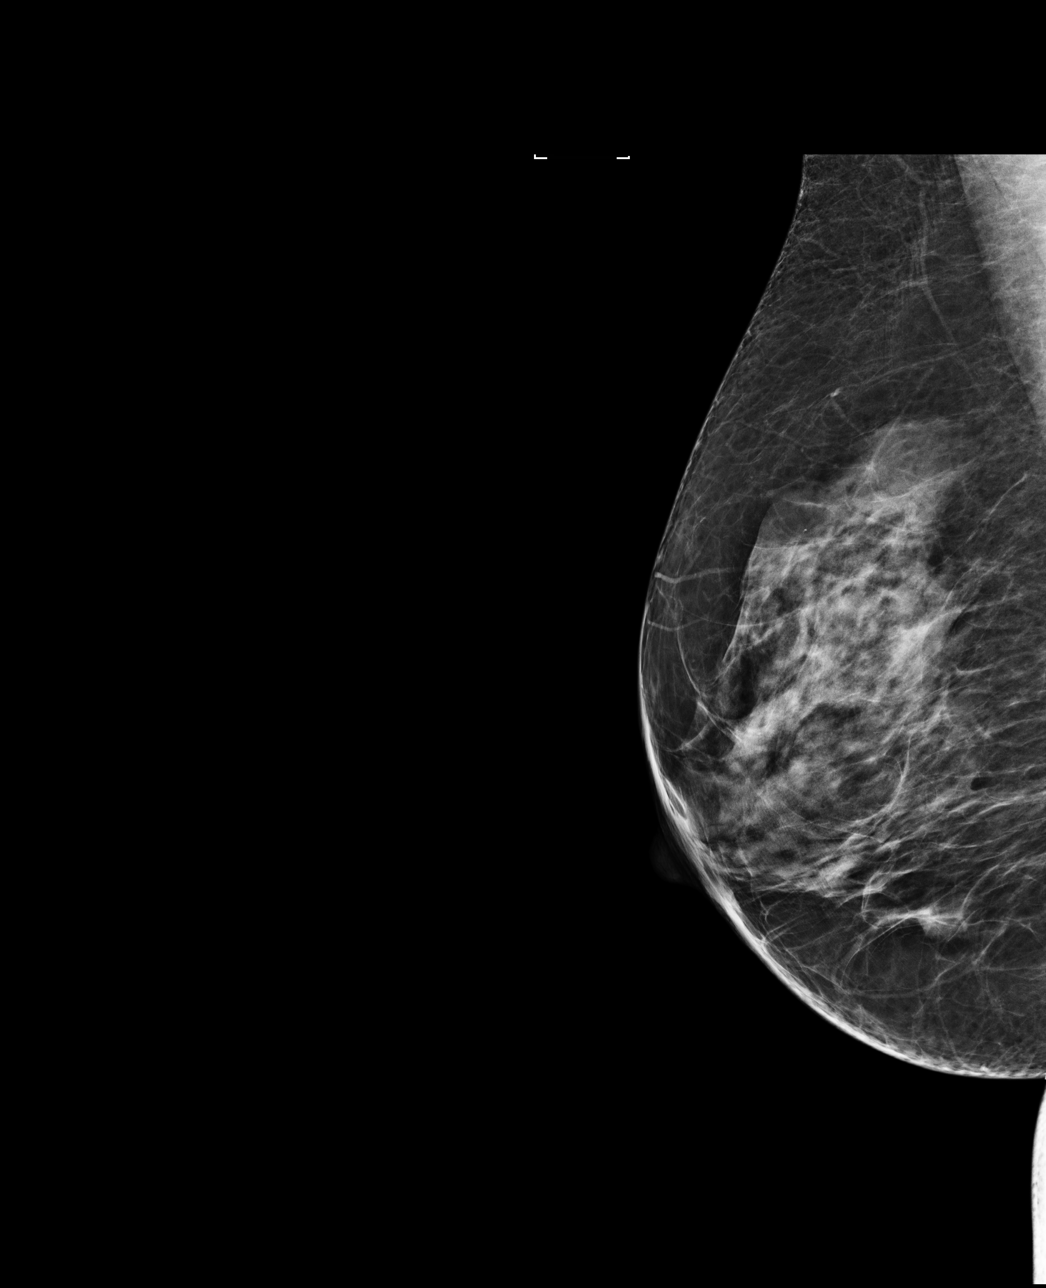

[4 of 4 positions shown; findings below may reference images not displayed]

ACR Breast Density Category c: The breast tissue is heterogeneously
dense, which may obscure small masses.
FINDINGS: There are no findings suspicious for malignancy. Images were
processed with CAD.
IMPRESSION: No mammographic evidence of malignancy. A result letter of this
screening mammogram will be mailed directly to the patient.

RECOMMENDATION:
Screening mammogram in one year. (Code:[0J])

BI-RADS CATEGORY  1: Negative.

## 2018-06-14 ENCOUNTER — Ambulatory Visit: Payer: BLUE CROSS/BLUE SHIELD | Admitting: Internal Medicine

## 2018-06-16 ENCOUNTER — Encounter: Payer: Self-pay | Admitting: Internal Medicine

## 2018-06-16 ENCOUNTER — Ambulatory Visit (INDEPENDENT_AMBULATORY_CARE_PROVIDER_SITE_OTHER)
Admission: RE | Admit: 2018-06-16 | Discharge: 2018-06-16 | Disposition: A | Discharge status: Home / Self Care | Payer: Medicare Other | Source: Ambulatory Visit | Attending: Internal Medicine | Admitting: Internal Medicine

## 2018-06-16 ENCOUNTER — Other Ambulatory Visit (INDEPENDENT_AMBULATORY_CARE_PROVIDER_SITE_OTHER): Payer: Medicare Other

## 2018-06-16 ENCOUNTER — Other Ambulatory Visit: Payer: Self-pay | Admitting: Internal Medicine

## 2018-06-16 ENCOUNTER — Ambulatory Visit (INDEPENDENT_AMBULATORY_CARE_PROVIDER_SITE_OTHER): Payer: Medicare Other | Admitting: Internal Medicine

## 2018-06-16 VITALS — BP 124/72 | HR 77 | Temp 98.0°F | Ht 65.0 in | Wt 172.0 lb

## 2018-06-16 DIAGNOSIS — M545 Low back pain, unspecified: Secondary | ICD-10-CM | POA: Insufficient documentation

## 2018-06-16 DIAGNOSIS — F32A Depression, unspecified: Secondary | ICD-10-CM

## 2018-06-16 DIAGNOSIS — Z0001 Encounter for general adult medical examination with abnormal findings: Secondary | ICD-10-CM | POA: Diagnosis not present

## 2018-06-16 DIAGNOSIS — M25552 Pain in left hip: Secondary | ICD-10-CM | POA: Diagnosis not present

## 2018-06-16 DIAGNOSIS — E785 Hyperlipidemia, unspecified: Secondary | ICD-10-CM

## 2018-06-16 DIAGNOSIS — G44309 Post-traumatic headache, unspecified, not intractable: Secondary | ICD-10-CM

## 2018-06-16 DIAGNOSIS — M25551 Pain in right hip: Secondary | ICD-10-CM | POA: Diagnosis not present

## 2018-06-16 DIAGNOSIS — Z114 Encounter for screening for human immunodeficiency virus [HIV]: Secondary | ICD-10-CM

## 2018-06-16 DIAGNOSIS — F329 Major depressive disorder, single episode, unspecified: Secondary | ICD-10-CM

## 2018-06-16 DIAGNOSIS — S79911A Unspecified injury of right hip, initial encounter: Secondary | ICD-10-CM | POA: Diagnosis not present

## 2018-06-16 DIAGNOSIS — E2839 Other primary ovarian failure: Secondary | ICD-10-CM | POA: Diagnosis not present

## 2018-06-16 DIAGNOSIS — G8929 Other chronic pain: Secondary | ICD-10-CM | POA: Insufficient documentation

## 2018-06-16 DIAGNOSIS — M25571 Pain in right ankle and joints of right foot: Secondary | ICD-10-CM

## 2018-06-16 DIAGNOSIS — S99911A Unspecified injury of right ankle, initial encounter: Secondary | ICD-10-CM | POA: Diagnosis not present

## 2018-06-16 DIAGNOSIS — Z23 Encounter for immunization: Secondary | ICD-10-CM

## 2018-06-16 DIAGNOSIS — S0990XS Unspecified injury of head, sequela: Secondary | ICD-10-CM

## 2018-06-16 LAB — BASIC METABOLIC PANEL
BUN: 14 mg/dL (ref 6–23)
CO2: 28 mEq/L (ref 19–32)
Calcium: 9.5 mg/dL (ref 8.4–10.5)
Chloride: 107 mEq/L (ref 96–112)
Creatinine, Ser: 0.79 mg/dL (ref 0.40–1.20)
GFR: 77.53 mL/min (ref >60.00)
Glucose, Bld: 97 mg/dL (ref 70–99)
Potassium: 3.9 mEq/L (ref 3.5–5.1)
Sodium: 144 mEq/L (ref 135–145)

## 2018-06-16 LAB — URINALYSIS, ROUTINE W REFLEX MICROSCOPIC
Bilirubin Urine: NEGATIVE
Ketones, ur: NEGATIVE
Nitrite: NEGATIVE
Specific Gravity, Urine: 1.02 (ref 1.000–1.030)
Total Protein, Urine: NEGATIVE
Urine Glucose: NEGATIVE
Urobilinogen, UA: 0.2 (ref 0.0–1.0)
pH: 5.5 (ref 5.0–8.0)

## 2018-06-16 LAB — LIPID PANEL
Cholesterol: 243 mg/dL — ABNORMAL HIGH (ref 0–200)
HDL: 42.8 mg/dL (ref >39.00)
LDL Cholesterol: 167 mg/dL — ABNORMAL HIGH (ref 0–99)
NonHDL: 200.66
Total CHOL/HDL Ratio: 6
Triglycerides: 167 mg/dL — ABNORMAL HIGH (ref 0.0–149.0)
VLDL: 33.4 mg/dL (ref 0.0–40.0)

## 2018-06-16 LAB — CBC WITH DIFFERENTIAL/PLATELET
Basophils Absolute: 0 10*3/uL (ref 0.0–0.1)
Basophils Relative: 0.7 % (ref 0.0–3.0)
Eosinophils Absolute: 0.3 10*3/uL (ref 0.0–0.7)
Eosinophils Relative: 5.3 % — ABNORMAL HIGH (ref 0.0–5.0)
HCT: 40.1 % (ref 36.0–46.0)
Hemoglobin: 13.5 g/dL (ref 12.0–15.0)
Lymphocytes Relative: 32.8 % (ref 12.0–46.0)
Lymphs Abs: 2.1 10*3/uL (ref 0.7–4.0)
MCHC: 33.5 g/dL (ref 30.0–36.0)
MCV: 92.2 fl (ref 78.0–100.0)
Monocytes Absolute: 0.4 10*3/uL (ref 0.1–1.0)
Monocytes Relative: 6.5 % (ref 3.0–12.0)
Neutro Abs: 3.4 10*3/uL (ref 1.4–7.7)
Neutrophils Relative %: 54.7 % (ref 43.0–77.0)
Platelets: 271 10*3/uL (ref 150.0–400.0)
RBC: 4.35 Mil/uL (ref 3.87–5.11)
RDW: 13.9 % (ref 11.5–15.5)
WBC: 6.3 10*3/uL (ref 4.0–10.5)

## 2018-06-16 LAB — HEPATIC FUNCTION PANEL
ALT: 12 U/L (ref 0–35)
AST: 13 U/L (ref 0–37)
Albumin: 4 g/dL (ref 3.5–5.2)
Alkaline Phosphatase: 88 U/L (ref 39–117)
Bilirubin, Direct: 0 mg/dL (ref 0.0–0.3)
Total Bilirubin: 0.4 mg/dL (ref 0.2–1.2)
Total Protein: 6.9 g/dL (ref 6.0–8.3)

## 2018-06-16 LAB — TSH: TSH: 3.48 u[IU]/mL (ref 0.35–4.50)

## 2018-06-16 IMAGING — DX DG ANKLE COMPLETE 3+V*R*
3 series · 3 of 3 positions shown · non-contrast
Comparison: Right ankle series [DATE].

CLINICAL DATA: 67-year-old female status post fall 2 weeks ago.
Pain.

EXAM:
RIGHT ANKLE - COMPLETE 3+ VIEW

[ankle ap]
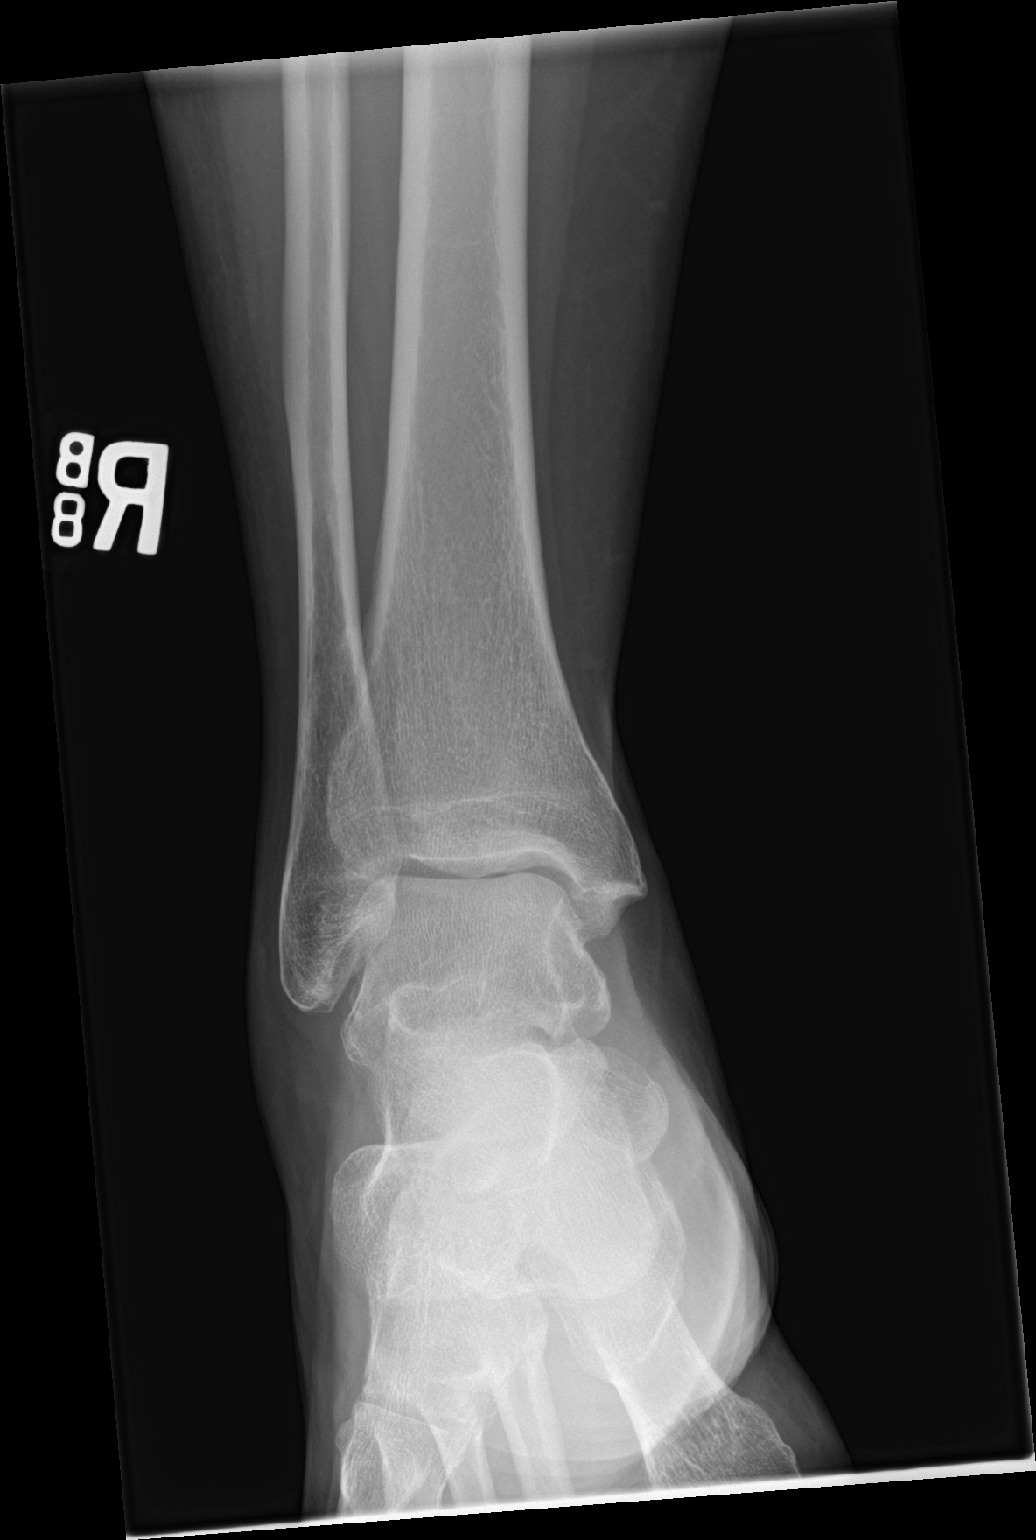

[ankle obl]
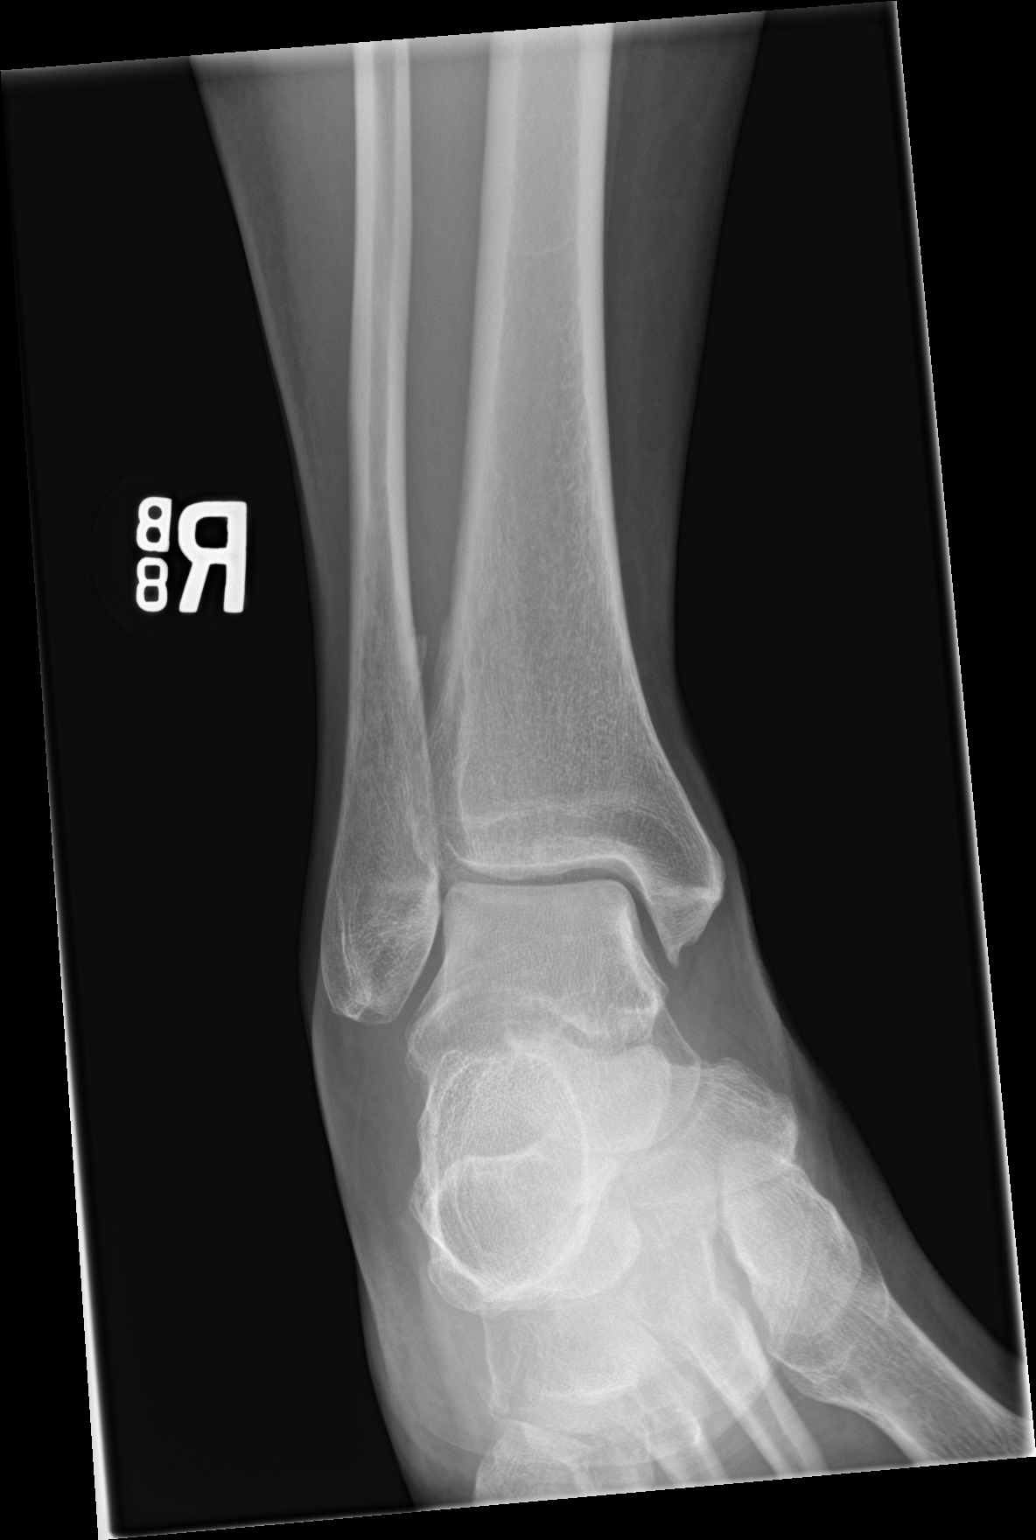

[ankle lat]
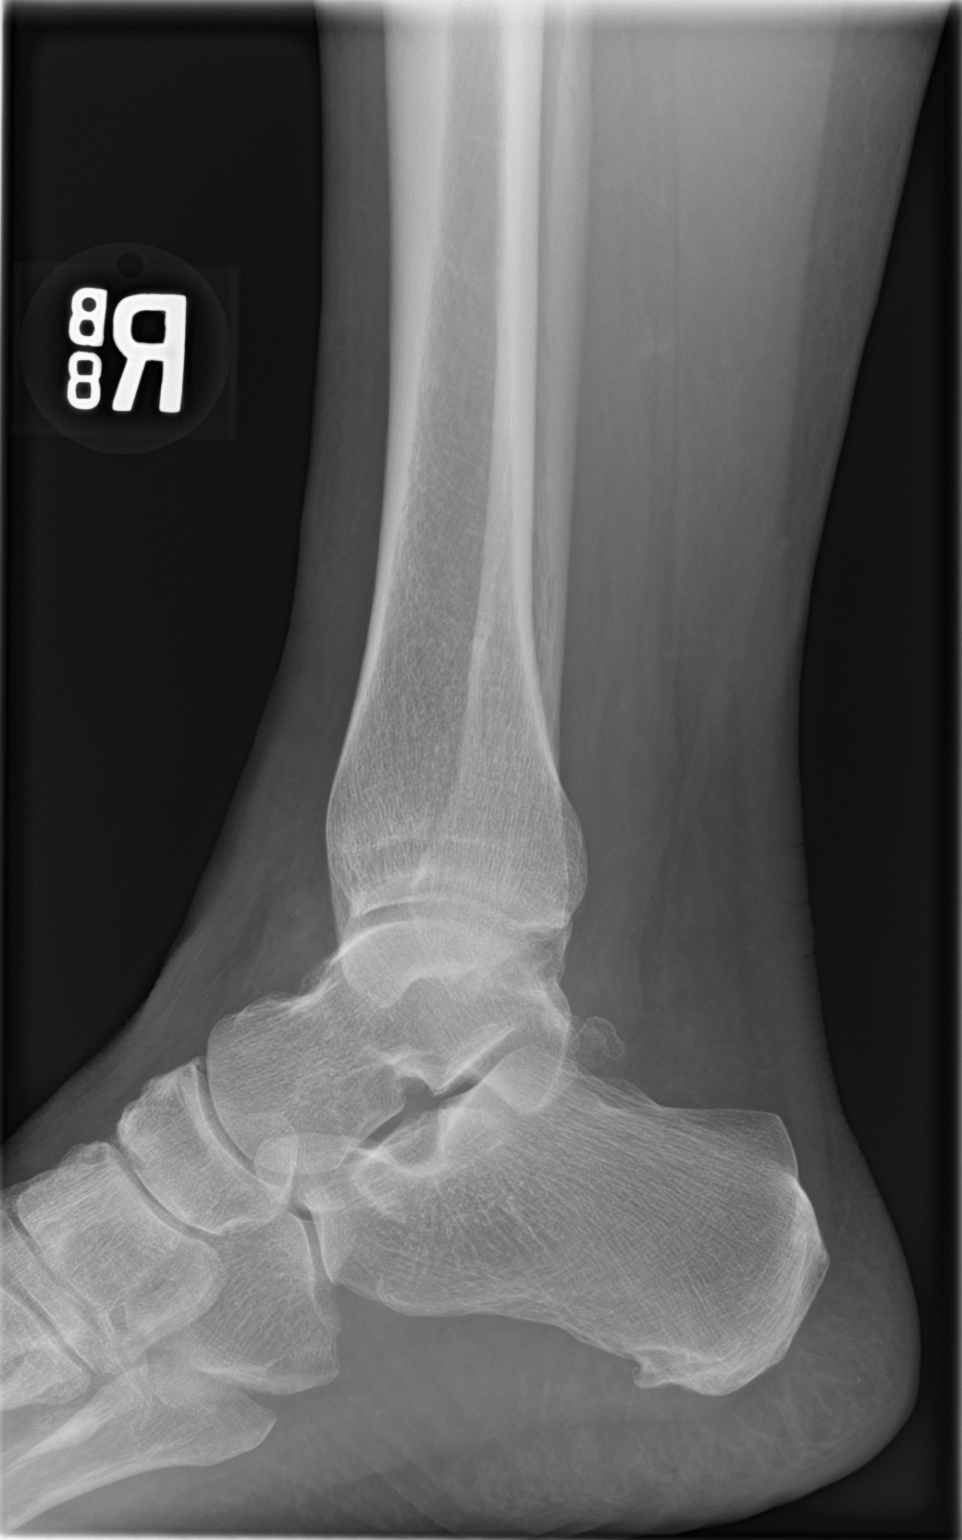

[3 of 3 positions shown; findings below may reference images not displayed]

FINDINGS: Bone mineralization is within normal limits for age. Preserved
mortise joint alignment. Intact talar dome. No joint effusion is
evident. The distal tibia and fibula appear intact. Mild chronic
degenerative spurring at the medial malleolus. Calcaneus appears
stable and intact with mild chronic degenerative spurring. Visible
bones of the right foot appear intact.
IMPRESSION: No acute osseous abnormality identified about the right ankle.

## 2018-06-16 IMAGING — DX DG HIP (WITH OR WITHOUT PELVIS) 3-4V BILAT
5 series · 5 of 5 positions shown · non-contrast
Comparison: [DATE] and earlier.

CLINICAL DATA: 67-year-old female status post fall 2 weeks ago. Hip
pain.

EXAM:
DG HIP (WITH OR WITHOUT PELVIS) 3-4V BILAT

[pelvis ap]
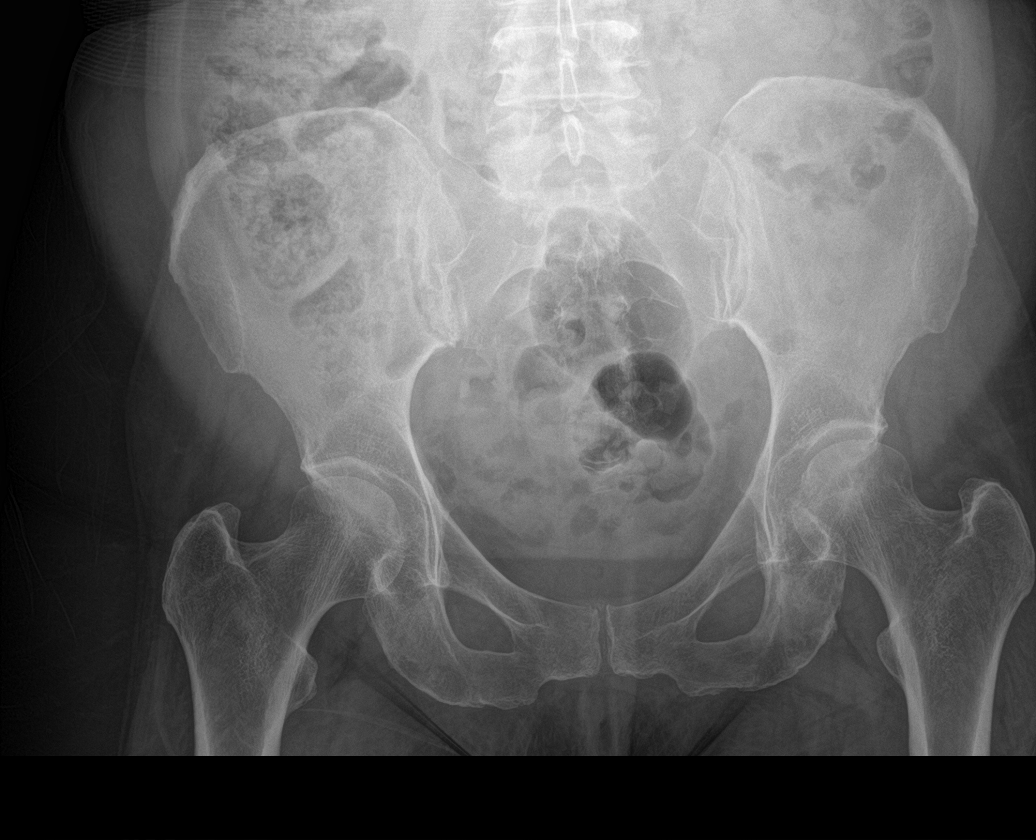

[hip ap (1 of 2)]
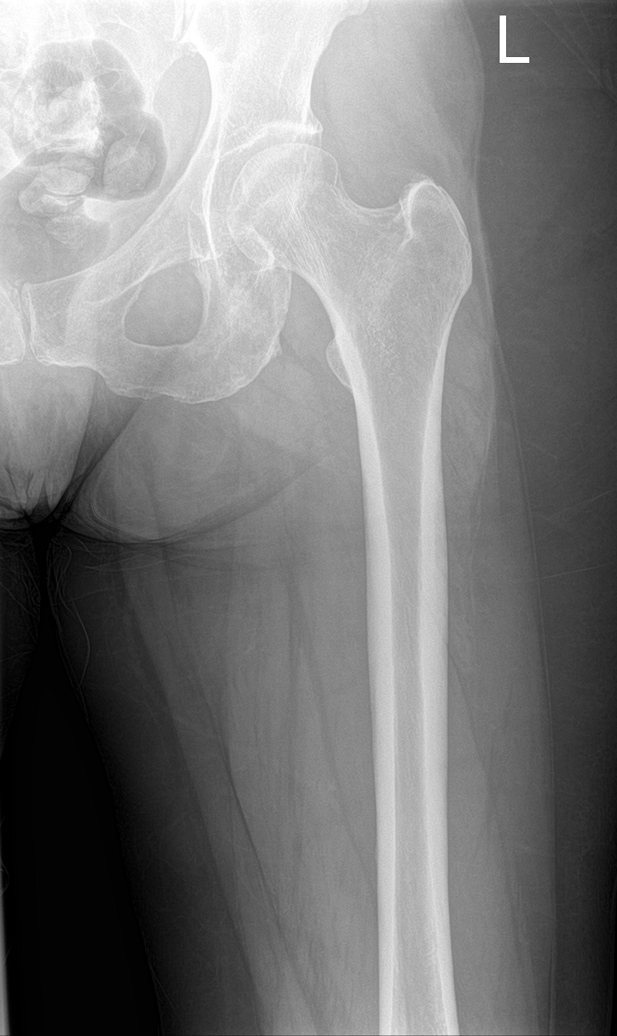

[hip lat (1 of 2)]
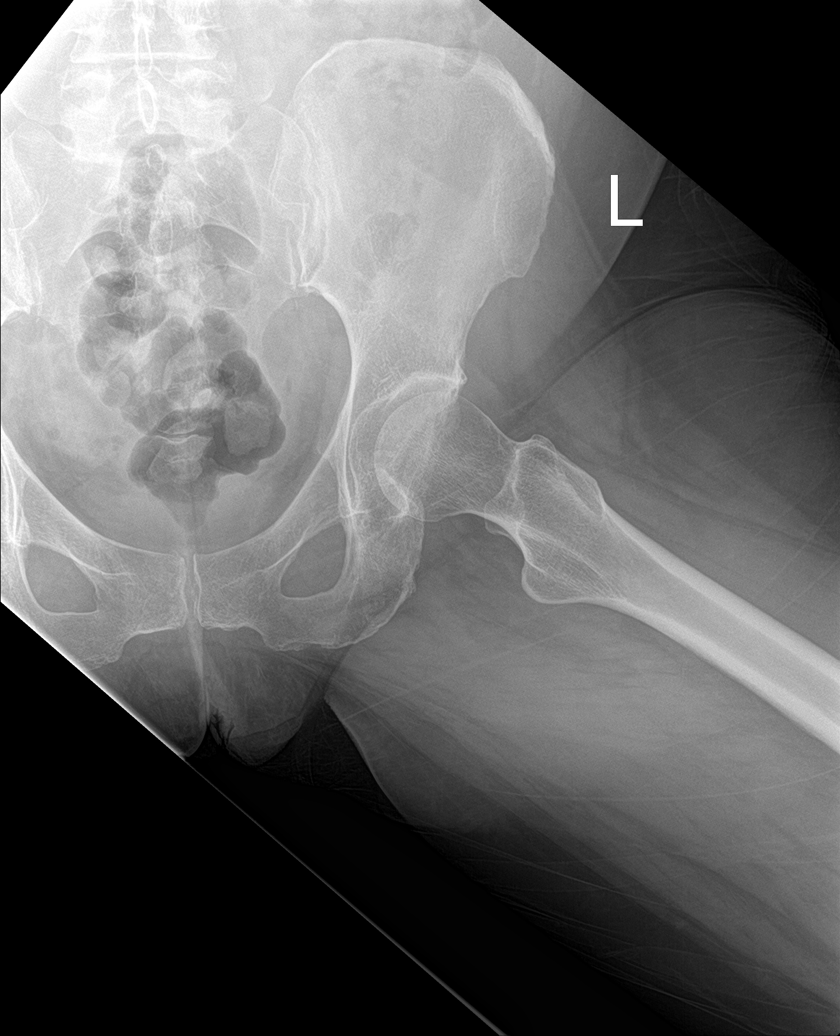

[hip ap (2 of 2)]
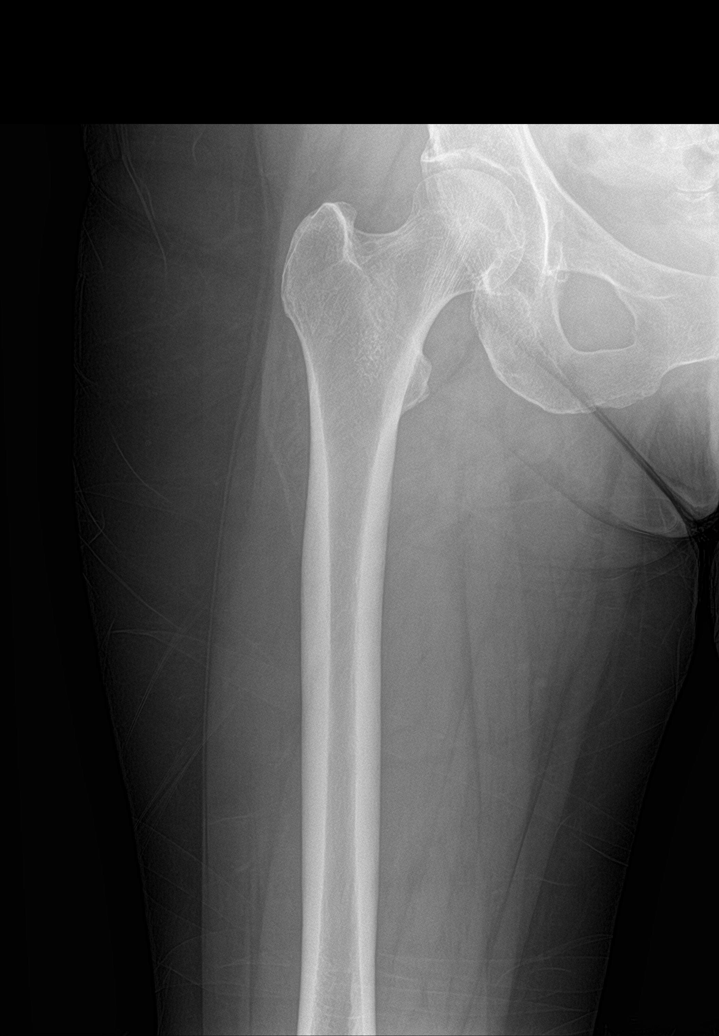

[hip lat (2 of 2)]
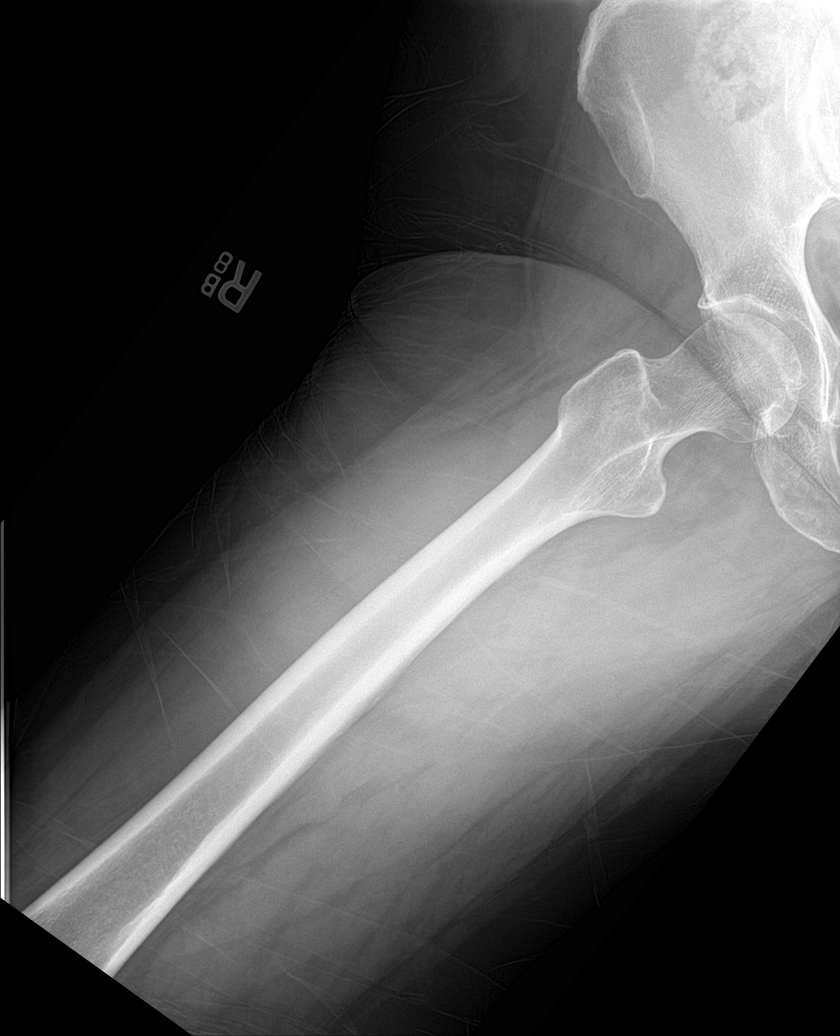

[5 of 5 positions shown; findings below may reference images not displayed]

FINDINGS: Femoral heads remain normally located. Hip joint spaces appear
stable and normal for age. The pelvis appears stable and intact. SI
joints appear normal. Proximal left femur appears stable and intact.
Proximal right femur appears stable and intact. Negative lower
abdominal and pelvic visceral contours; retained stool in the colon.
IMPRESSION: Stable, negative.

## 2018-06-16 IMAGING — DX DG SACRUM/COCCYX 2+V
3 series · 3 of 3 positions shown · non-contrast
Comparison: Lumbar radiographs today. CT Abdomen and Pelvis
[DATE].

CLINICAL DATA: 67-year-old female status post fall 2 weeks ago.
Pain.

EXAM:
SACRUM AND COCCYX - 2+ VIEW

[sacrum ap]
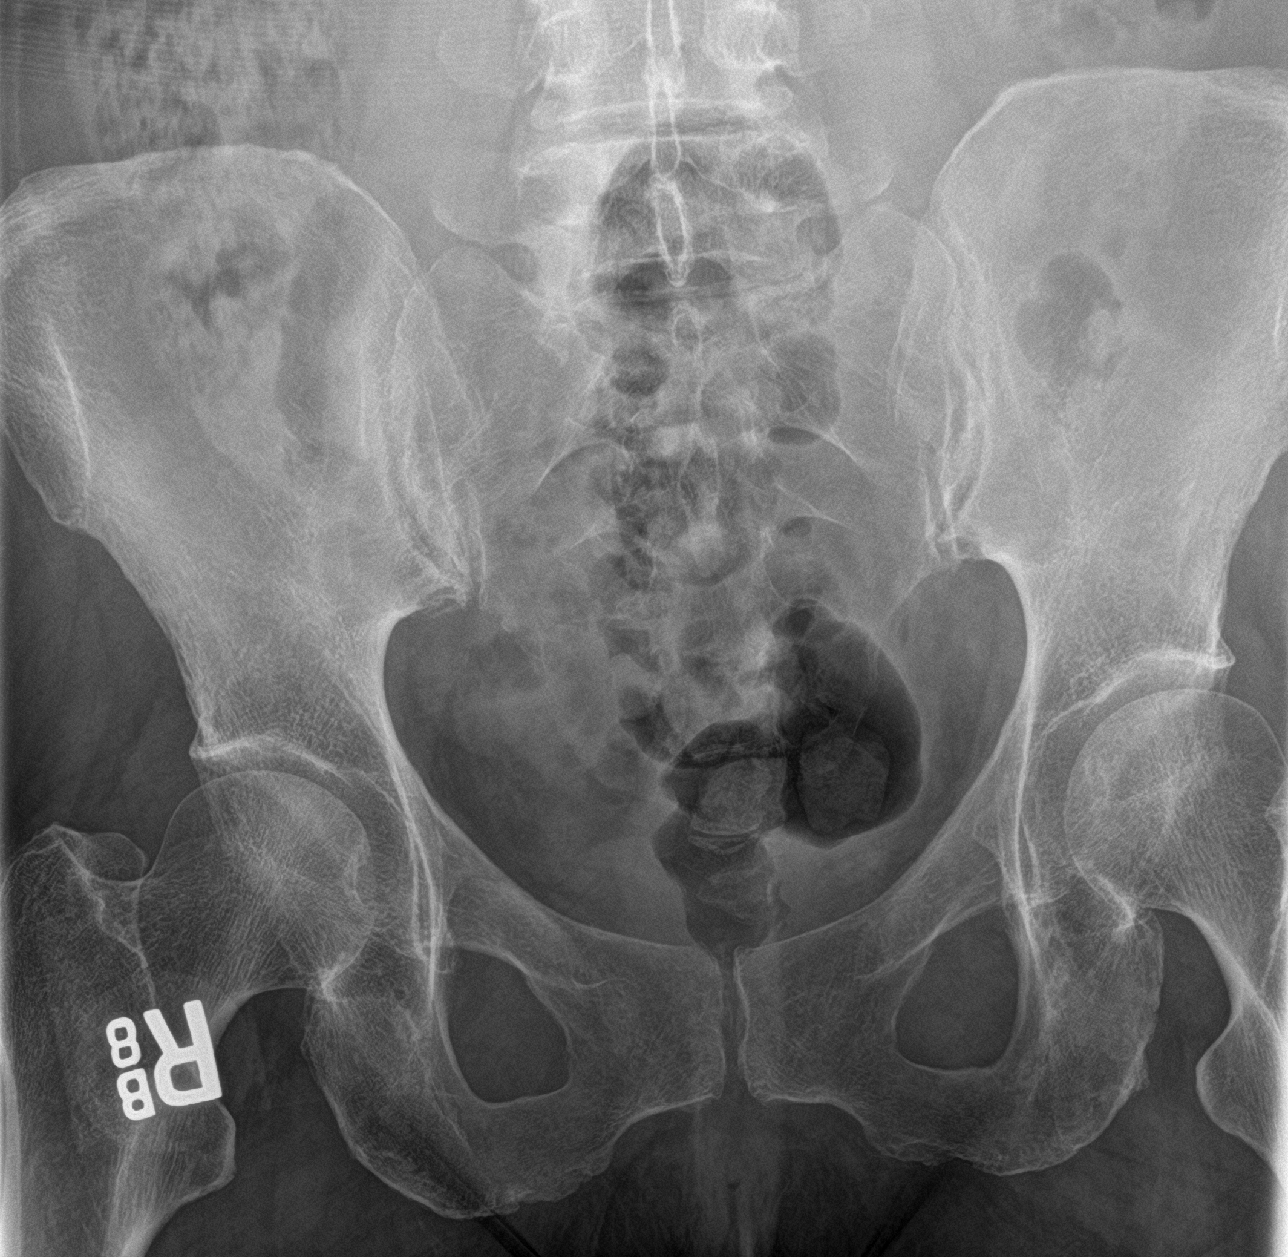

[coccyx ap]
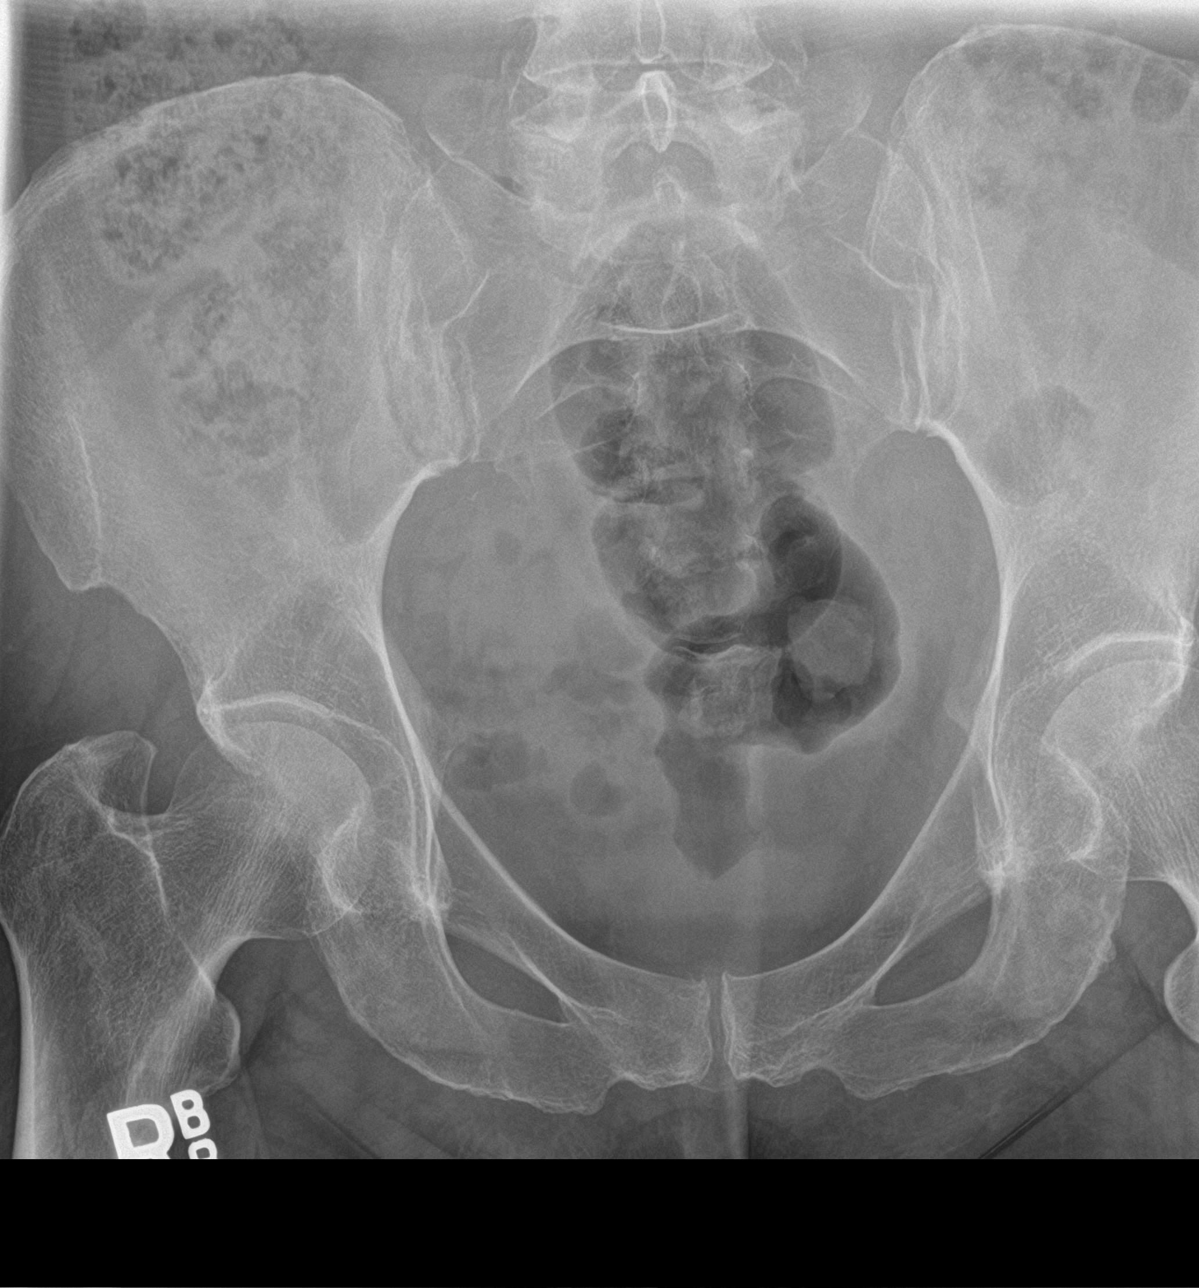

[sacrum lat]
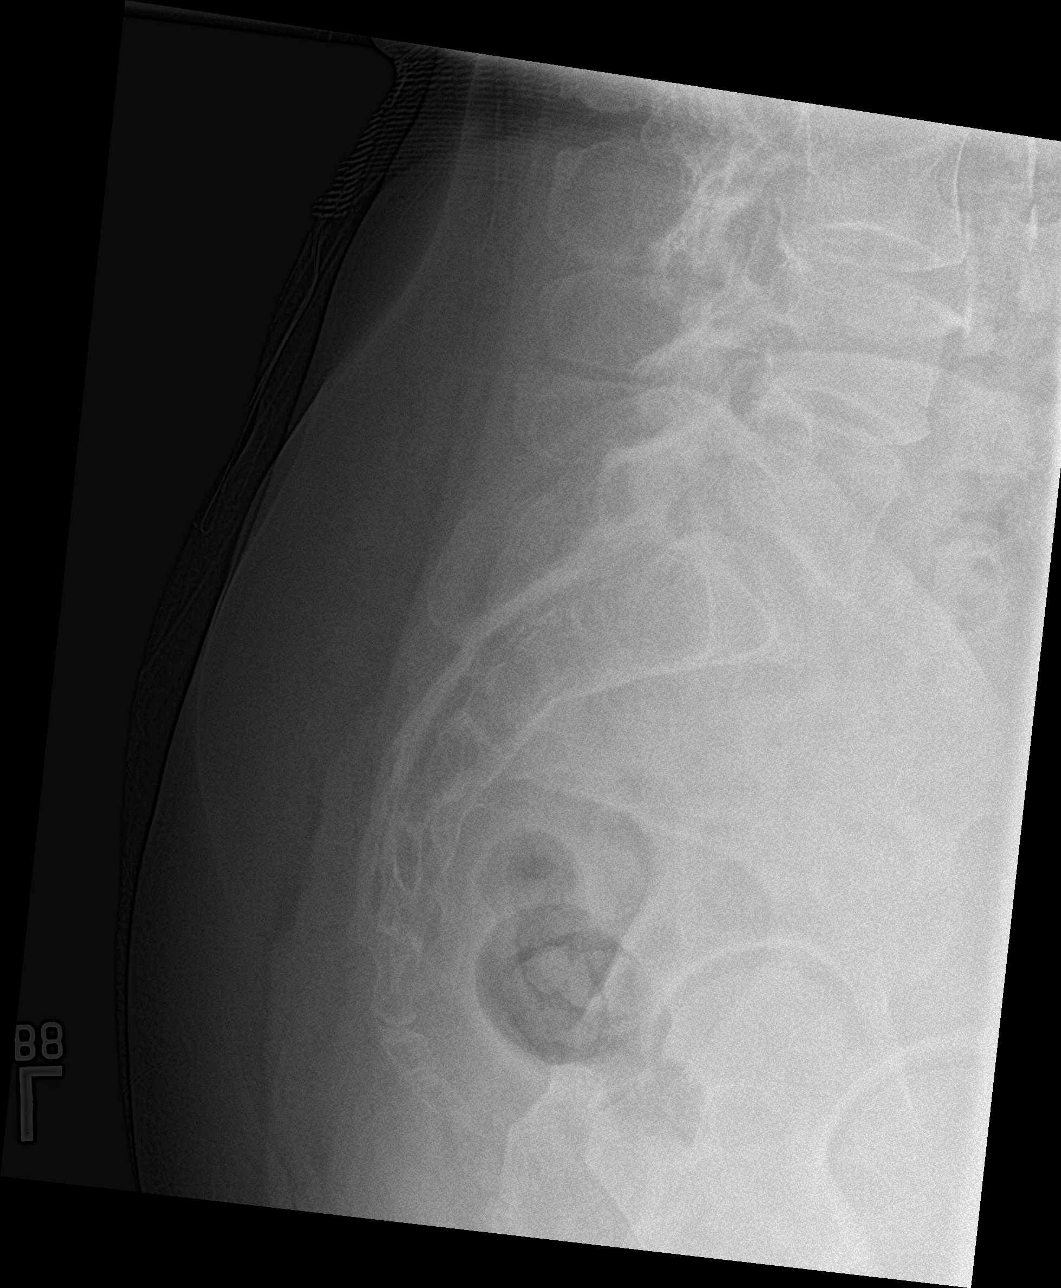

[3 of 3 positions shown; findings below may reference images not displayed]

FINDINGS: On the lateral view the sacral and coccygeal segments appear stable
since [UW] and intact. The sacral ala and SI joints appear within
normal limits. No acute osseous abnormality identified. Negative
lower abdominal and pelvic visceral contours.
IMPRESSION: No acute fracture or dislocation identified about the sacrum or
coccyx.

## 2018-06-16 IMAGING — DX DG LUMBAR SPINE COMPLETE 4+V
5 series · 5 of 5 positions shown · non-contrast
Comparison: CT Abdomen and Pelvis [DATE].

CLINICAL DATA: 67-year-old female status post fall 2 weeks ago.
Pain.

EXAM:
LUMBAR SPINE - COMPLETE 4+ VIEW

[l-spine ap]
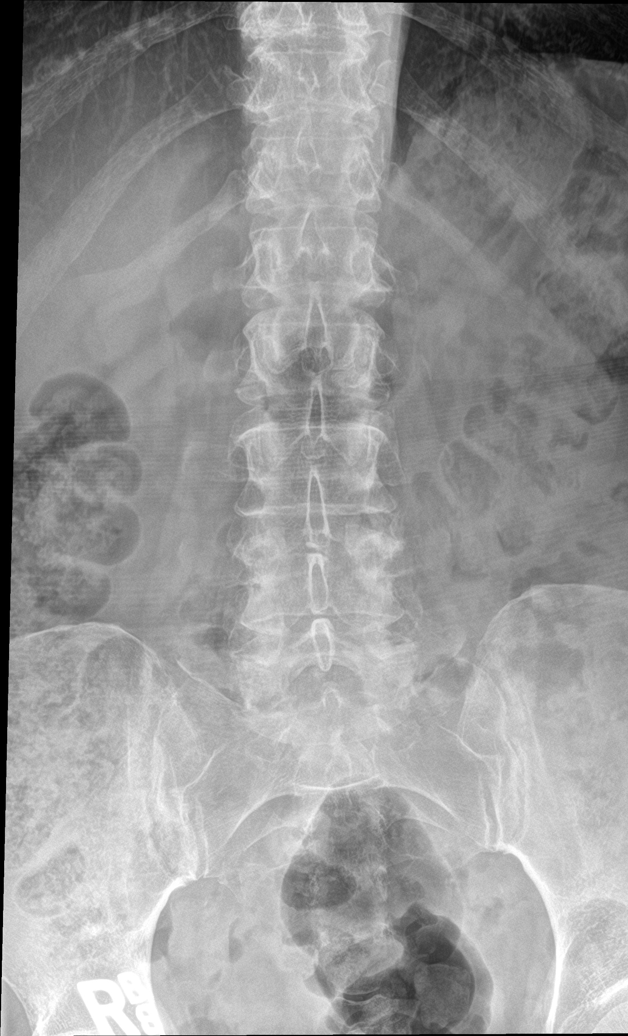

[l-spine obl (1 of 2)]
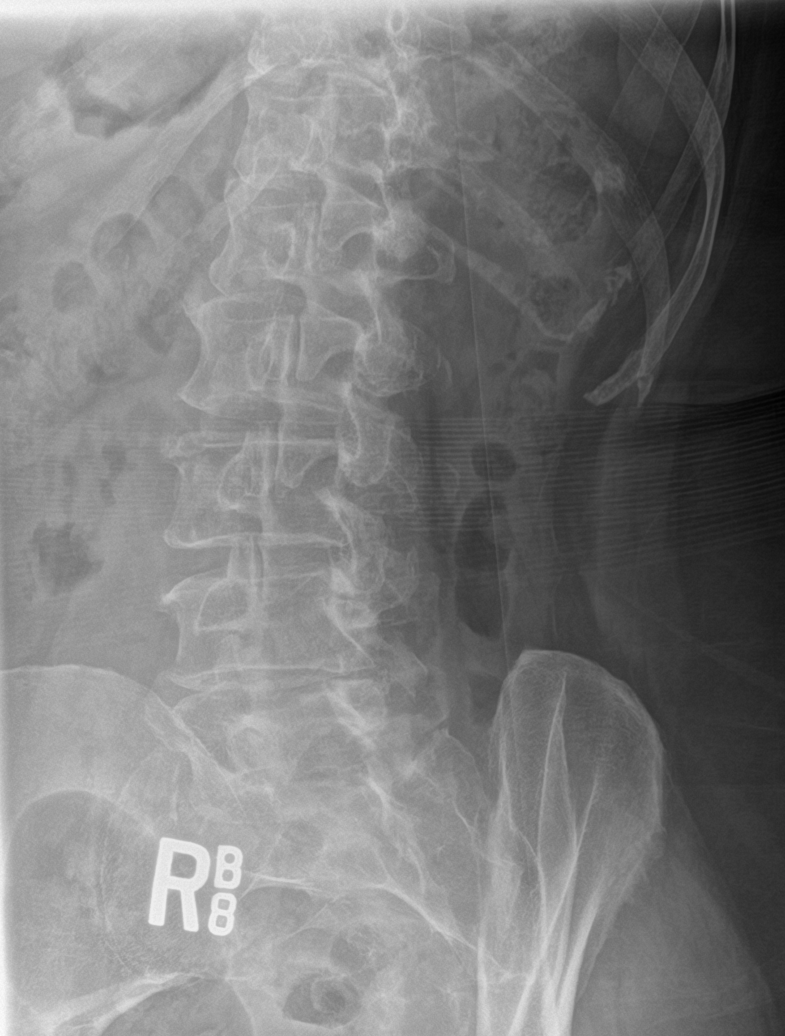

[l-spine obl (2 of 2)]
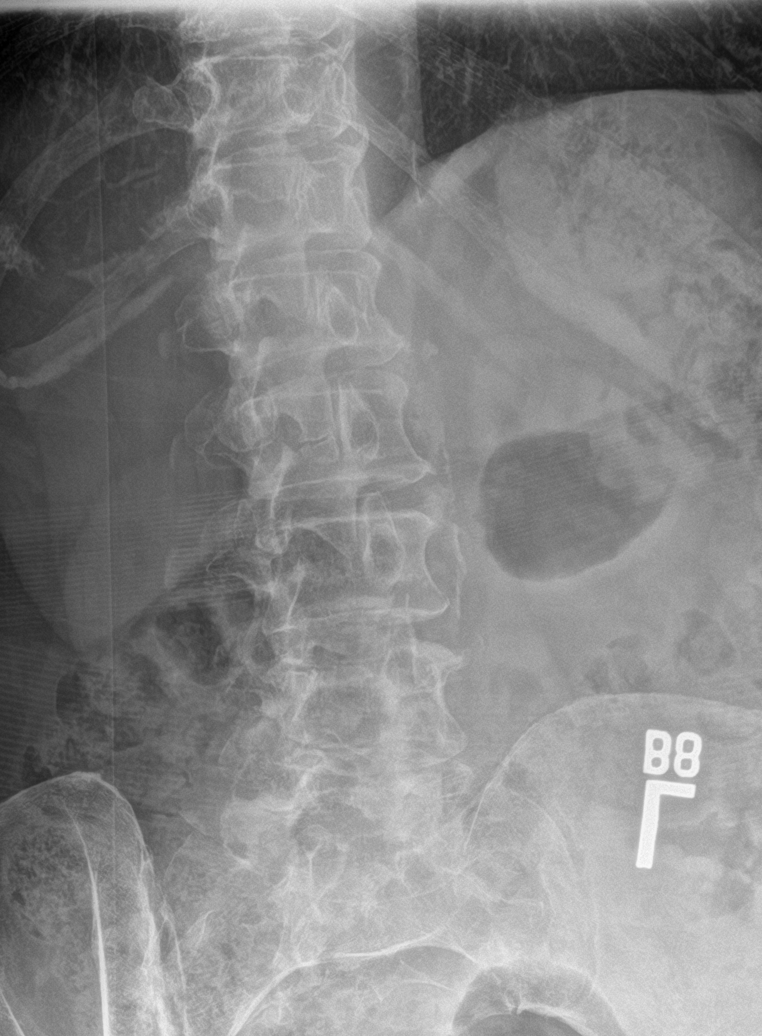

[l-spine lat]
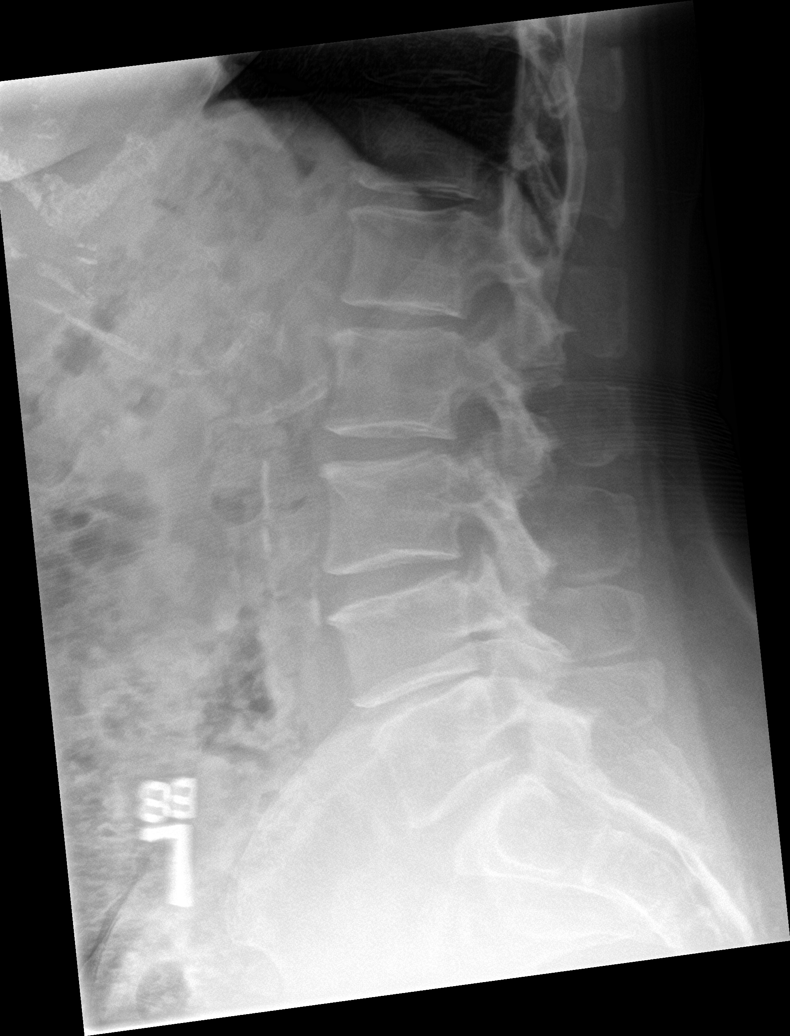

[l-spine spot]
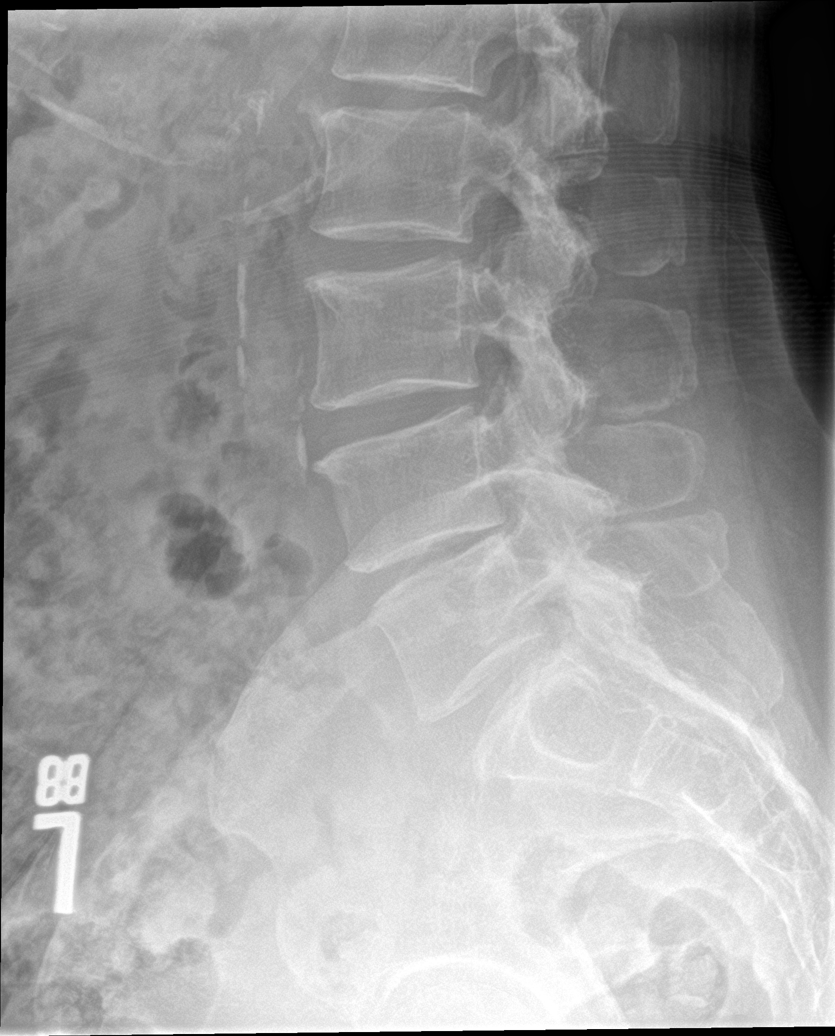

[5 of 5 positions shown; findings below may reference images not displayed]

FINDINGS: Normal lumbar segmentation. Stable vertebral height and alignment.
Chronic L4-L5 disc space loss. Mild lumbar facet hypertrophy. No
pars fracture. Visible lower thoracic levels appear grossly intact.
Sacral ala and SI joints appear within normal limits. Calcified
aortic atherosclerosis. Negative visible bowel gas pattern.
IMPRESSION: 1.  No acute osseous abnormality identified in the lumbar spine.
2. Chronic L4-L5 disc degeneration.
3.  Aortic Atherosclerosis ([2X]-[2X]).

## 2018-06-16 MED ORDER — LOVASTATIN 40 MG PO TABS: 40.0000 mg | ORAL_TABLET | Freq: Every day | ORAL | 3 refills | Status: DC

## 2018-06-16 MED ORDER — TRAMADOL HCL 50 MG PO TABS: 50.0000 mg | ORAL_TABLET | Freq: Three times a day (TID) | ORAL | 1 refills | Status: DC | PRN

## 2018-06-16 MED ORDER — SUMATRIPTAN SUCCINATE 100 MG PO TABS: 100.0000 mg | ORAL_TABLET | ORAL | 5 refills | Status: DC | PRN

## 2018-06-16 NOTE — Assessment & Plan Note (Signed)
S/p fall to left side x 2 wks with persistent pain, for coccyx, ls spine, and pelvix/hip films, tramadol prn, consider PT - declines for now, agree with using walker at home for balance and risk of fall

## 2018-06-16 NOTE — Assessment & Plan Note (Signed)

## 2018-06-16 NOTE — Assessment & Plan Note (Signed)
Exam c/w severe sprain still painful vs underlying fx - for right ankle xray

## 2018-06-16 NOTE — Assessment & Plan Note (Signed)
stable overall by history and exam, recent data reviewed with pt, and pt to continue medical treatment as before,  to f/u any worsening symptoms or concerns  

## 2018-06-16 NOTE — Patient Instructions (Addendum)
Please schedule the bone density test before leaving today at the scheduling desk (where you check out)  You had the flu shot today, and the Pneumovax pneumonia shot today  Please take all new medication as prescribed - the tramadol for the right ankle and left hip area pain, as well as the generic imitrex for the migraines  Please remember to see your GYN for a yearly exam  Please continue all other medications as before, and refills have been done if requested.  Please have the pharmacy call with any other refills you may need.  Please continue your efforts at being more active, low cholesterol diet, and weight control.  You are otherwise up to date with prevention measures today.  Please keep your appointments with your specialists as you may have planned  Please go to the XRAY Department in the Basement (go straight as you get off the elevator) for the x-ray testing  Please go to the LAB in the Basement (turn left off the elevator) for the tests to be done today  You will be contacted by phone if any changes need to be made immediately.  Otherwise, you will receive a letter about your results with an explanation, but please check with MyChart first.  Please remember to sign up for MyChart if you have not done so, as this will be important to you in the future with finding out test results, communicating by private email, and scheduling acute appointments online when needed.  Please return in 6 months, or sooner if needed

## 2018-06-16 NOTE — Assessment & Plan Note (Signed)
Out of statin, for f/u labs, consider restart statin

## 2018-06-16 NOTE — Progress Notes (Signed)
Subjective:    Patient ID: Amber Hicks, female    DOB: 1951-08-25, 67 y.o.   MRN: [id number]  HPI  Here for wellness and f/u;  Overall doing ok;  Pt denies Chest pain, worsening SOB, DOE, wheezing, orthopnea, PND, worsening LE edema, palpitations, dizziness or syncope.  Pt denies neurological change such as new headache, facial or extremity weakness.  Pt denies polydipsia, polyuria, or low sugar symptoms. Pt states overall good compliance with treatment and medications, good tolerability, and has been trying to follow appropriate diet.  Pt denies worsening depressive symptoms, suicidal ideation or panic. No fever, night sweats, wt loss, loss of appetite, or other constitutional symptoms.  Pt states good ability with ADL's, has usually low fall risk until recent, home safety reviewed and adequate, no other significant changes in hearing or vision, and only occasionally active with exercise. Also c/o 2 wks onset right ankle pain after stepped in whole, actually fell times that day related, fireman found on the ground and helped back to the house, has had constant mod sharp pain since then but pain and sweling persists, with soaking and ice pak not working.  Causes her to be off balance a bit, gets around the house with a borrowed walker.    Has ongoing headache bifrontal throbbing mod to occassional severe, no blurred vision or n/v, but photophobia + and phonophobia neg.   Past Medical History:  Diagnosis Date  . Depression 01/22/2012  . Hx of adenomatous colonic polyps 01/26/2015  . Hyperlipidemia 01/24/2012  . VITAMIN D DEFICIENCY 07/09/2010   Qualifier: Diagnosis of  By: Jenny Reichmann MD, Hunt Oris    Past Surgical History:  Procedure Laterality Date  . ceaserian section     1 time    reports that she has never smoked. She has never used smokeless tobacco. She reports that she does not drink alcohol or use drugs. family history includes Heart disease in her brother and mother. Allergies  Allergen  Reactions  . Doxycycline Rash   Current Outpatient Medications on File Prior to Visit  Medication Sig Dispense Refill  . acetaminophen (TYLENOL) 325 MG tablet Take 650 mg by mouth every 6 (six) hours as needed for mild pain.    Marland Kitchen AFLURIA PRESERVATIVE FREE 0.5 ML SUSY Inject 1 Dose as directed once.  0  . Multiple Vitamin (MULTIVITAMIN) tablet Take 1 tablet by mouth daily.     No current facility-administered medications on file prior to visit.    Review of Systems Constitutional: Negative for other unusual diaphoresis, sweats, appetite or weight changes HENT: Negative for other worsening hearing loss, ear pain, facial swelling, mouth sores or neck stiffness.   Eyes: Negative for other worsening pain, redness or other visual disturbance.  Respiratory: Negative for other stridor or swelling Cardiovascular: Negative for other palpitations or other chest pain  Gastrointestinal: Negative for worsening diarrhea or loose stools, blood in stool, distention or other pain Genitourinary: Negative for hematuria, flank pain or other change in urine volume.  Musculoskeletal: Negative for myalgias or other joint swelling.  Skin: Negative for other color change, or other wound or worsening drainage.  Neurological: Negative for other syncope or numbness. Hematological: Negative for other adenopathy or swelling Psychiatric/Behavioral: Negative for hallucinations, other worsening agitation, SI, self-injury, or new decreased concentration All other system neg per pt    Objective:   Physical Exam BP 124/72   Pulse 77   Temp 98 F (36.7 C) (Oral)   Ht 5\' 5"  (1.651 m)  Wt 172 lb (78 kg)   SpO2 96%   BMI 28.62 kg/m  VS noted, not ill appearing Constitutional: Pt is oriented to person, place, and time. Appears well-developed and well-nourished, in no significant distress and comfortable Head: Normocephalic and atraumatic  Eyes: Conjunctivae and EOM are normal. Pupils are equal, round, and reactive to  light Right Ear: External ear normal without discharge Left Ear: External ear normal without discharge Nose: Nose without discharge or deformity Mouth/Throat: Oropharynx is without other ulcerations and moist  Neck: Normal range of motion. Neck supple. No JVD present. No tracheal deviation present or significant neck LA or mass Cardiovascular: Normal rate, regular rhythm, normal heart sounds and intact distal pulses.   Pulmonary/Chest: WOB normal and breath sounds without rales or wheezing  Abdominal: Soft. Bowel sounds are normal. NT. No HSM  Musculoskeletal: Normal range of motion. Exhibits no edema, except for right ankle lateral aspect tender swelling ; also mod tender to mid lumbar and paravertebral, sacral and left buttock and left lateral hip Lymphadenopathy: Has no other cervical adenopathy.  Neurological: Pt is alert and oriented to person, place, and time. Pt has normal reflexes. No cranial nerve deficit. Motor grossly intact, Gait intact Skin: Skin is warm and dry. No rash noted or new ulcerations Psychiatric:  Has nervous not depressed mood and affect. Behavior is normal without agitation No other exam findings Lab Results  Component Value Date   WBC 7.2 03/24/2016   HGB 13.0 03/24/2016   HCT 39.2 03/24/2016   PLT 273.0 03/24/2016   GLUCOSE 94 03/24/2016   CHOL 215 (H) 03/24/2016   TRIG 119.0 03/24/2016   HDL 47.00 03/24/2016   LDLDIRECT 163.0 01/22/2012   LDLCALC 144 (H) 03/24/2016   ALT 9 03/24/2016   AST 13 03/24/2016   NA 142 03/24/2016   K 3.9 03/24/2016   CL 107 03/24/2016   CREATININE 0.76 03/24/2016   BUN 11 03/24/2016   CO2 31 03/24/2016   TSH 1.29 03/24/2016       Assessment & Plan:

## 2018-06-16 NOTE — Assessment & Plan Note (Addendum)
C/w migraine, for imitrex prn,  to f/u any worsening symptoms or concerns  In addition to the time spent performing CPE, I spent an additional 15 minutes face to face,in which greater than 50% of this time was spent in counseling and coordination of care for patient's illness as documented, including the differential dx, treatment, further evaluation and other management of headache, right ankle pain, and low back pain, depression, HLD

## 2018-06-17 ENCOUNTER — Telehealth: Payer: Self-pay

## 2018-06-17 LAB — HIV ANTIBODY (ROUTINE TESTING W REFLEX): HIV 1&2 Ab, 4th Generation: NONREACTIVE

## 2018-06-17 NOTE — Telephone Encounter (Signed)
Pt.'s husband called - given results and instructions. Verbalizes understanding.

## 2018-06-17 NOTE — Telephone Encounter (Signed)
Called pt, LVM.   CRM created.  

## 2018-06-17 NOTE — Telephone Encounter (Signed)
-----   Message from Biagio Borg, MD sent at 06/16/2018  3:21 PM EDT ----- Letter sent, cont same tx except  The test results show that your current treatment is OK, except the LDL cholesterol is quite high.  We should try to improve this with a low cost medication called lovastatin.  I will send the prescription, and you should hear from the office as well.    Shirron to please inform pt, I will do rx

## 2018-06-19 ENCOUNTER — Other Ambulatory Visit: Payer: Self-pay | Admitting: Internal Medicine

## 2018-06-19 ENCOUNTER — Encounter: Payer: Self-pay | Admitting: Internal Medicine

## 2018-06-19 DIAGNOSIS — E2839 Other primary ovarian failure: Secondary | ICD-10-CM | POA: Diagnosis not present

## 2018-06-19 MED ORDER — ALENDRONATE SODIUM 70 MG PO TABS: 70.0000 mg | ORAL_TABLET | ORAL | 3 refills | Status: DC

## 2018-08-04 ENCOUNTER — Telehealth: Payer: Self-pay | Admitting: Internal Medicine

## 2018-08-04 NOTE — Telephone Encounter (Signed)
Patient left a message with [facility] wanting a call back from her MD.  I called patient back and the spouse answered the phone.  States patients hip is bothering her and she is requesting pain meds.   Did not know the name of the medication requesting.

## 2018-08-05 ENCOUNTER — Telehealth: Payer: Self-pay

## 2018-08-05 NOTE — Telephone Encounter (Signed)
Pt would need at OV to discuss any issue. PCP does not do phone consultations.   Copied from [place] [phone]. Topic: General - Other >> Aug 04, 2018  3:24 PM Rayann Heman wrote: Reason for CRM: pt called and stated that she would like to talk to dr Jenny Reichmann personally about her hip. Please advise [phone] >> Aug 04, 2018  4:47 PM Peace, Tammy L wrote: Please see phone note I have routed

## 2018-08-06 ENCOUNTER — Encounter: Payer: Self-pay | Admitting: Family Medicine

## 2018-08-06 ENCOUNTER — Ambulatory Visit (INDEPENDENT_AMBULATORY_CARE_PROVIDER_SITE_OTHER): Payer: Medicare Other | Admitting: Family Medicine

## 2018-08-06 VITALS — BP 124/78 | HR 70 | Temp 97.8°F | Resp 16 | Ht 65.0 in | Wt 167.0 lb

## 2018-08-06 DIAGNOSIS — M25552 Pain in left hip: Secondary | ICD-10-CM

## 2018-08-06 DIAGNOSIS — R1032 Left lower quadrant pain: Secondary | ICD-10-CM

## 2018-08-06 DIAGNOSIS — M85851 Other specified disorders of bone density and structure, right thigh: Secondary | ICD-10-CM

## 2018-08-06 NOTE — Patient Instructions (Signed)
With your left lower quadrant pain and hip pain- My advice is to go immediately to the emergency room.   You refused this advice. With severity of your pain- I think you need blood work, x-ray as well as potential abdominal imaging with CT which we cannot provide here today.   Please go ahead and schedule a visit with the [place] office for Monday before you leave. You can also get x-rays before you come in- I ordered a hip x-ray today given the fall you had.   Please go to [facility] - located [address] across the street from [place] - in the basement - Hours: 8:30-5:30 PM M-F. Do not need appointment.

## 2018-08-06 NOTE — Progress Notes (Signed)
Subjective:  Amber Hicks is a 67 y.o. year old very pleasant female patient who presents for/with See problem oriented charting ROS-no rash over hips or abdomen noted.  No fever or chills.  Has had recent fall.  No chest pain reported.  Past Medical History-  Patient Active Problem List   Diagnosis Date Noted  . Right ankle pain 06/16/2018  . Low back pain 06/16/2018  . Left leg pain 08/09/2015  . Hx of adenomatous colonic polyps 01/26/2015  . 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 Vertigo 08/17/2014  . Hidradenitis 04/05/2013  . Hyperlipidemia 01/24/2012  . Depression 01/22/2012  . Encounter for preventative adult health care exam with abnormal findings 01/15/2012  . Vitamin D deficiency 07/09/2010    Medications- reviewed and updated Current Outpatient Medications  Medication Sig Dispense Refill  . acetaminophen (TYLENOL) 325 MG tablet Take 650 mg by mouth every 6 (six) hours as needed for mild pain.    Marland Kitchen AFLURIA PRESERVATIVE FREE 0.5 ML SUSY Inject 1 Dose as directed once.  0  . alendronate (FOSAMAX) 70 MG tablet Take 1 tablet (70 mg total) by mouth every 7 (seven) days. Take with a full glass of water on an empty stomach. 12 tablet 3  . lovastatin (MEVACOR) 40 MG tablet Take 1 tablet (40 mg total) by mouth at bedtime. 90 tablet 3  . Multiple Vitamin (MULTIVITAMIN) tablet Take 1 tablet by mouth daily.    . SUMAtriptan (IMITREX) 100 MG tablet Take 1 tablet (100 mg total) by mouth every 2 (two) hours as needed for migraine or headache. May repeat in 2 hours if headache persists or recurs. 10 tablet 5  . traMADol (ULTRAM) 50 MG tablet Take 1 tablet (50 mg total) by mouth every 8 (eight) hours as needed. 60 tablet 1   No current facility-administered medications for this visit.     Objective: BP 124/78 (BP Location: Left Arm, Patient Position: Sitting, Cuff Size: Small)   Pulse 70   Temp 97.8 F (36.6 C) (Oral)   Resp 16   Ht 5\' 5"  (1.651 m)   Wt 167 lb (75.8 kg)    SpO2 97%   BMI 27.79 kg/m  Gen: NAD, resting comfortably CV: RRR no murmurs rubs or gallops Lungs: CTAB no crackles, wheeze, rhonchi Abdomen: soft/patient very tender in left lower quadrant but not in other areas of stomach-there is no pain with bumping table though there is some guarding when patient knows abdomen is being palpated/nondistended/normal bowel sounds. No rebound.  Ext: no edema Skin: warm, dry MSK; with movement of left hip-patient complains of pain in LLQ Antalgic gait  Assessment/Plan:  Left hip pain S: left hip pain. 2 days ago she fell- fell in yard- went to reach for garden can and slipped and fell down onto left hip. 2 weeks ago saw Dr. Jenny Reichmann and hip was already hurting her at that time. Patient and husband states she has a low pain threshold. Patient grabs at left hip and seems in significant pain during visit.  Reports severe left hip pain with aching sensation. She has tramadol and tylenol available for pain which help some.   Note from 06/16/18 patient was to start fosamax- she is not sure how/if she is taking this. Since patient is on this she would be considered at minimum osteopenic and more likely osteoporotic though I see neither listed on problem list.  A/P: Patient with minimum of osteopenia with severe left hip pain grasping at the left  hip during exam today after recent fall- I told her I thought she needed an x-ray immediately and advised her to go to the emergency room due to possible hip fracture-she declines and is aware of risks.  She did at least agree to come back on Monday for x-ray of the hip.  I strongly encouraged if pain worsens or she had fever or other new symptoms to go to emergency room immediately  On exam she has severe left lower quadrant plain-I also advised her to go to the emergency room for this which she declines.  She does agree to follow-up with Dr. Jenny Reichmann next week  Husband is concerned there may be over interpretation on my exam of her  level of pain/severity of injury due to low pain threshold.  There were some portions of her exam which were not consistent-for example the severity of the abdominal pain yet I was able to bump table pretty significantly without worsening pain whatsoever.  She also seem to be in worse pain when being directly observed by me- as she was  Walking in without being aware of me monitoring she appeared much more comfortable walking.  I am hopeful her longer-term doctor-patient relationship with Dr. Jenny Reichmann will help interpreting her symptoms.   Future Appointments  Date Time Provider [place]  08/10/2018  3:00 PM Biagio Borg, MD [facility]  12/15/2018  8:00 AM Biagio Borg, MD [facility]   Return precautions advised.  Garret Reddish, MD

## 2018-08-08 ENCOUNTER — Ambulatory Visit (INDEPENDENT_AMBULATORY_CARE_PROVIDER_SITE_OTHER)
Admission: RE | Admit: 2018-08-08 | Discharge: 2018-08-08 | Disposition: A | Discharge status: Home / Self Care | Payer: Medicare Other | Source: Ambulatory Visit | Attending: Family Medicine | Admitting: Family Medicine

## 2018-08-08 DIAGNOSIS — S79912A Unspecified injury of left hip, initial encounter: Secondary | ICD-10-CM | POA: Diagnosis not present

## 2018-08-08 DIAGNOSIS — M25552 Pain in left hip: Secondary | ICD-10-CM | POA: Diagnosis not present

## 2018-08-08 IMAGING — DX DG HIP (WITH OR WITHOUT PELVIS) 2-3V*L*
3 series · 3 of 3 positions shown · non-contrast
Comparison: None.

CLINICAL DATA: Left hip pain after fall 1 week ago. Initial
encounter.

EXAM:
DG HIP (WITH OR WITHOUT PELVIS) 2-3V LEFT

[hip ap]
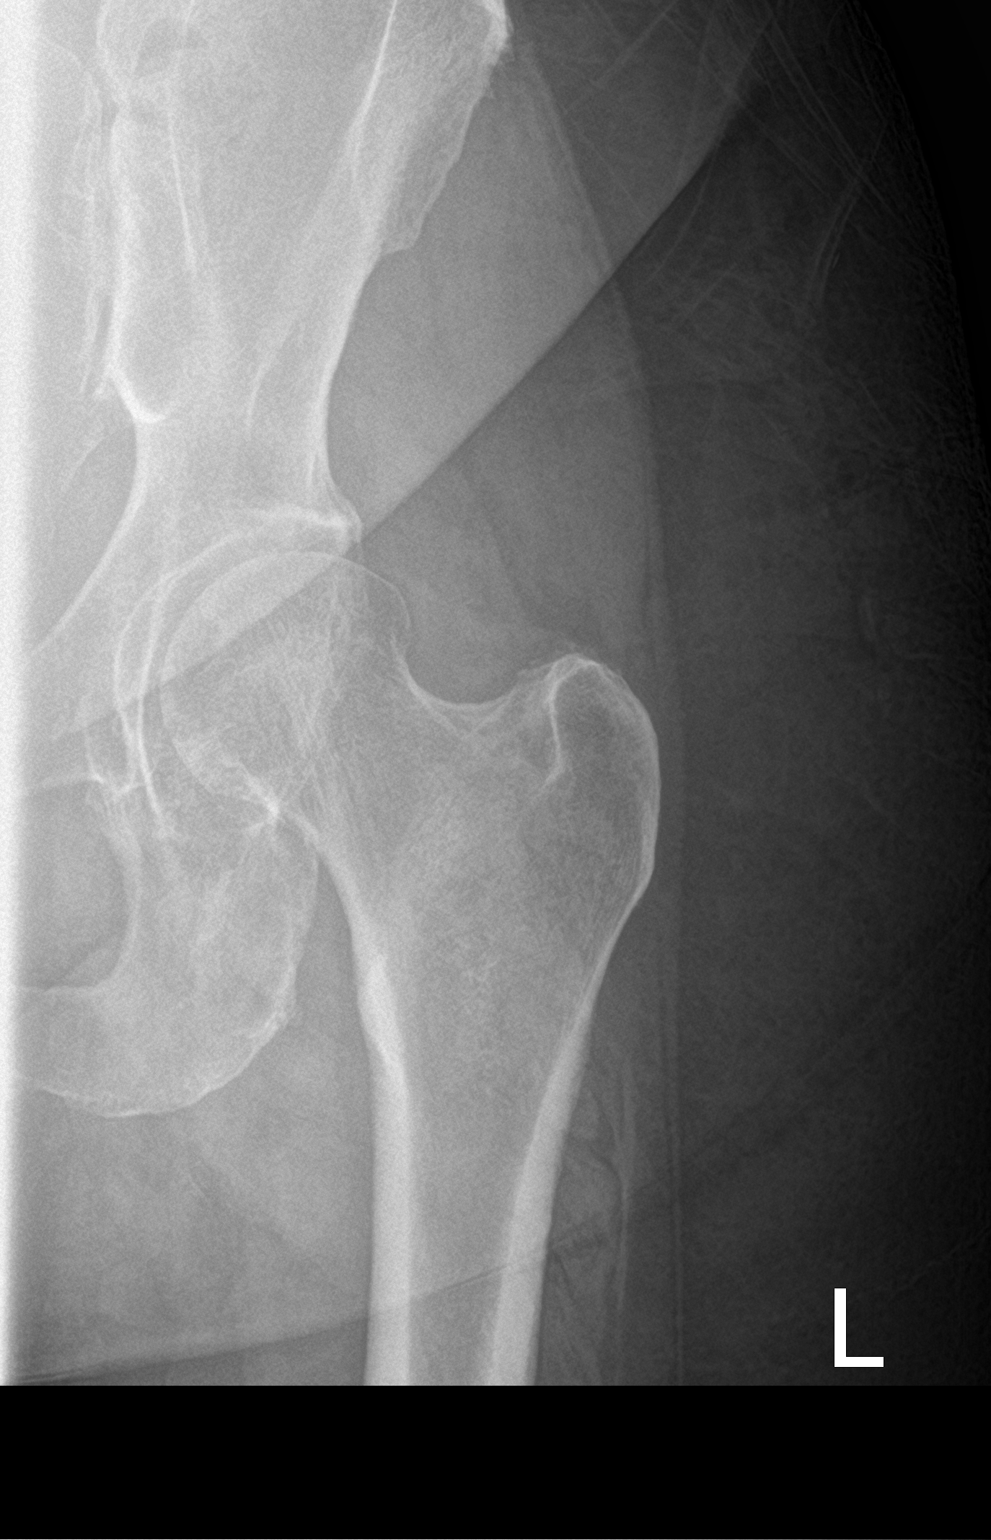

[hip lat]
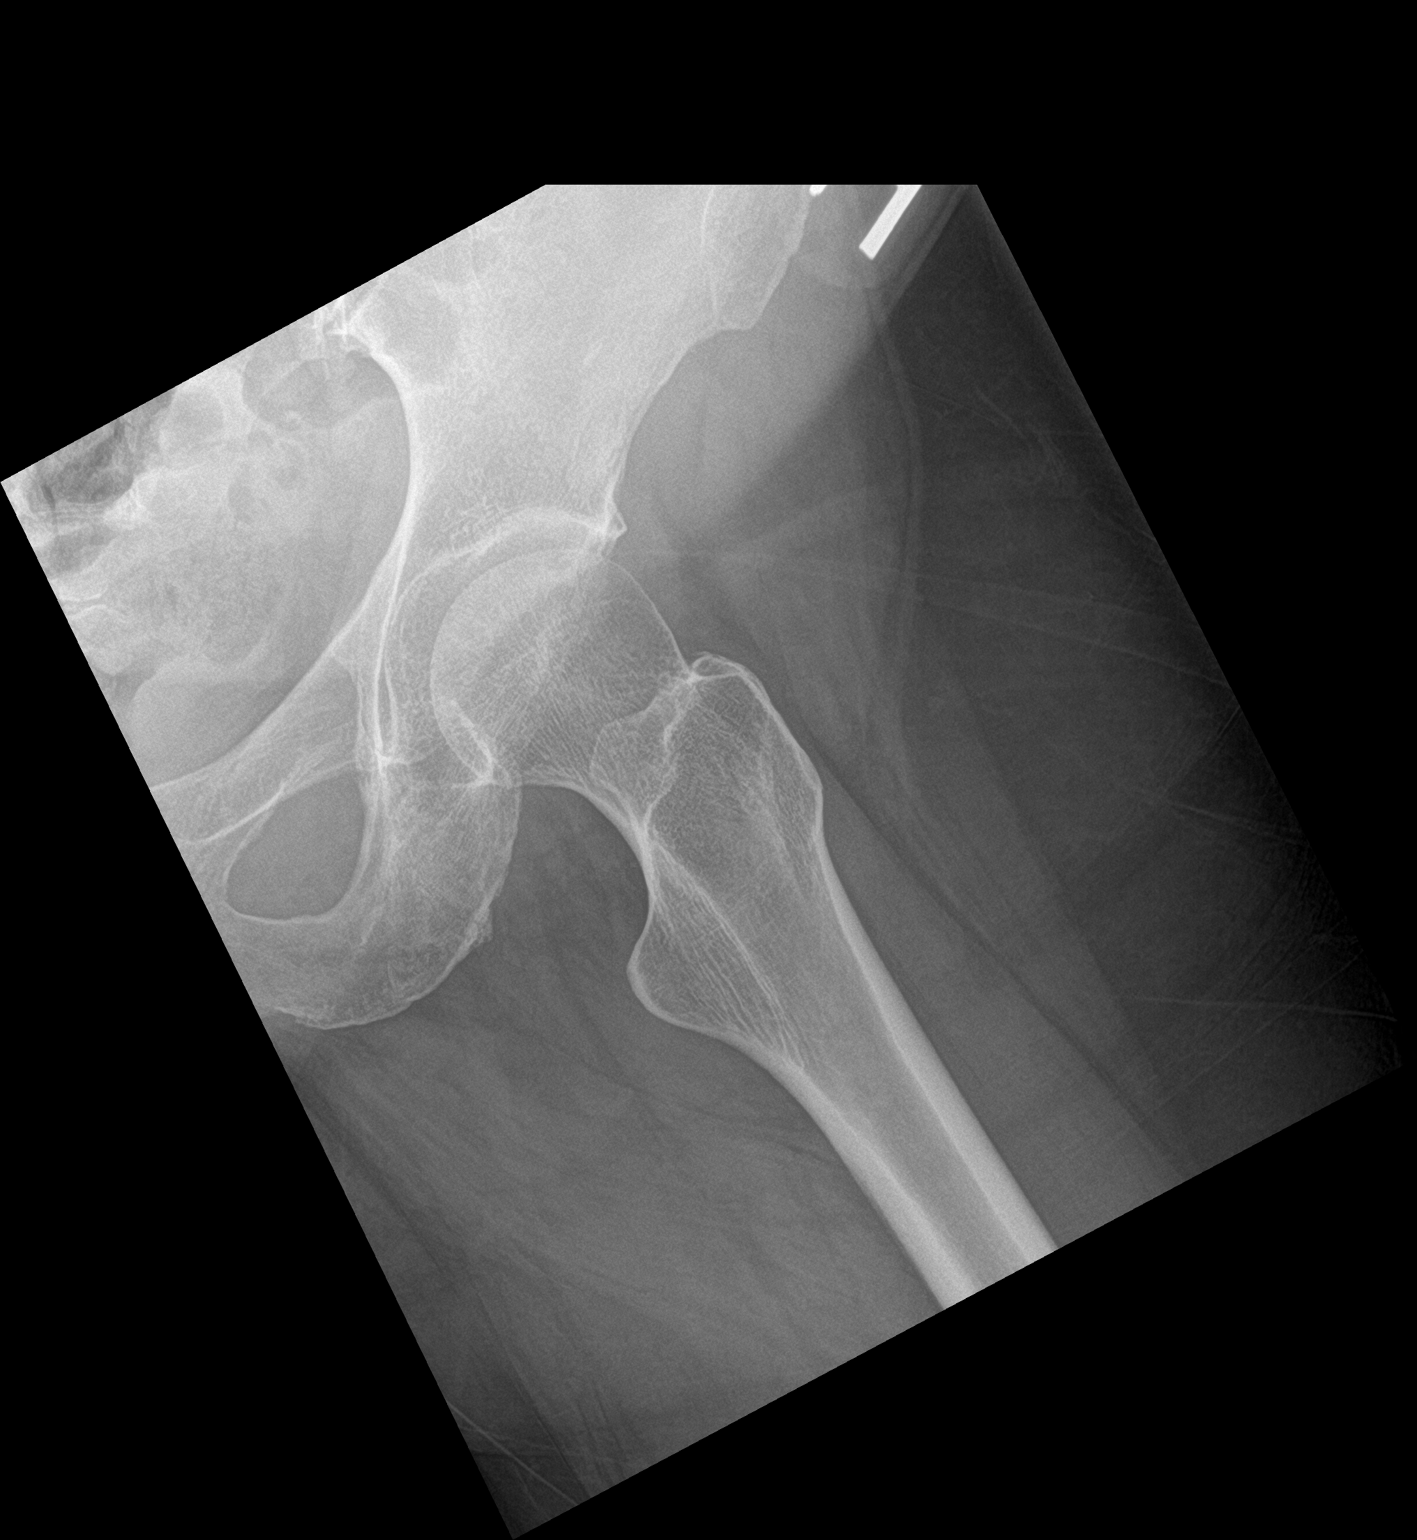

[pelvis ap]
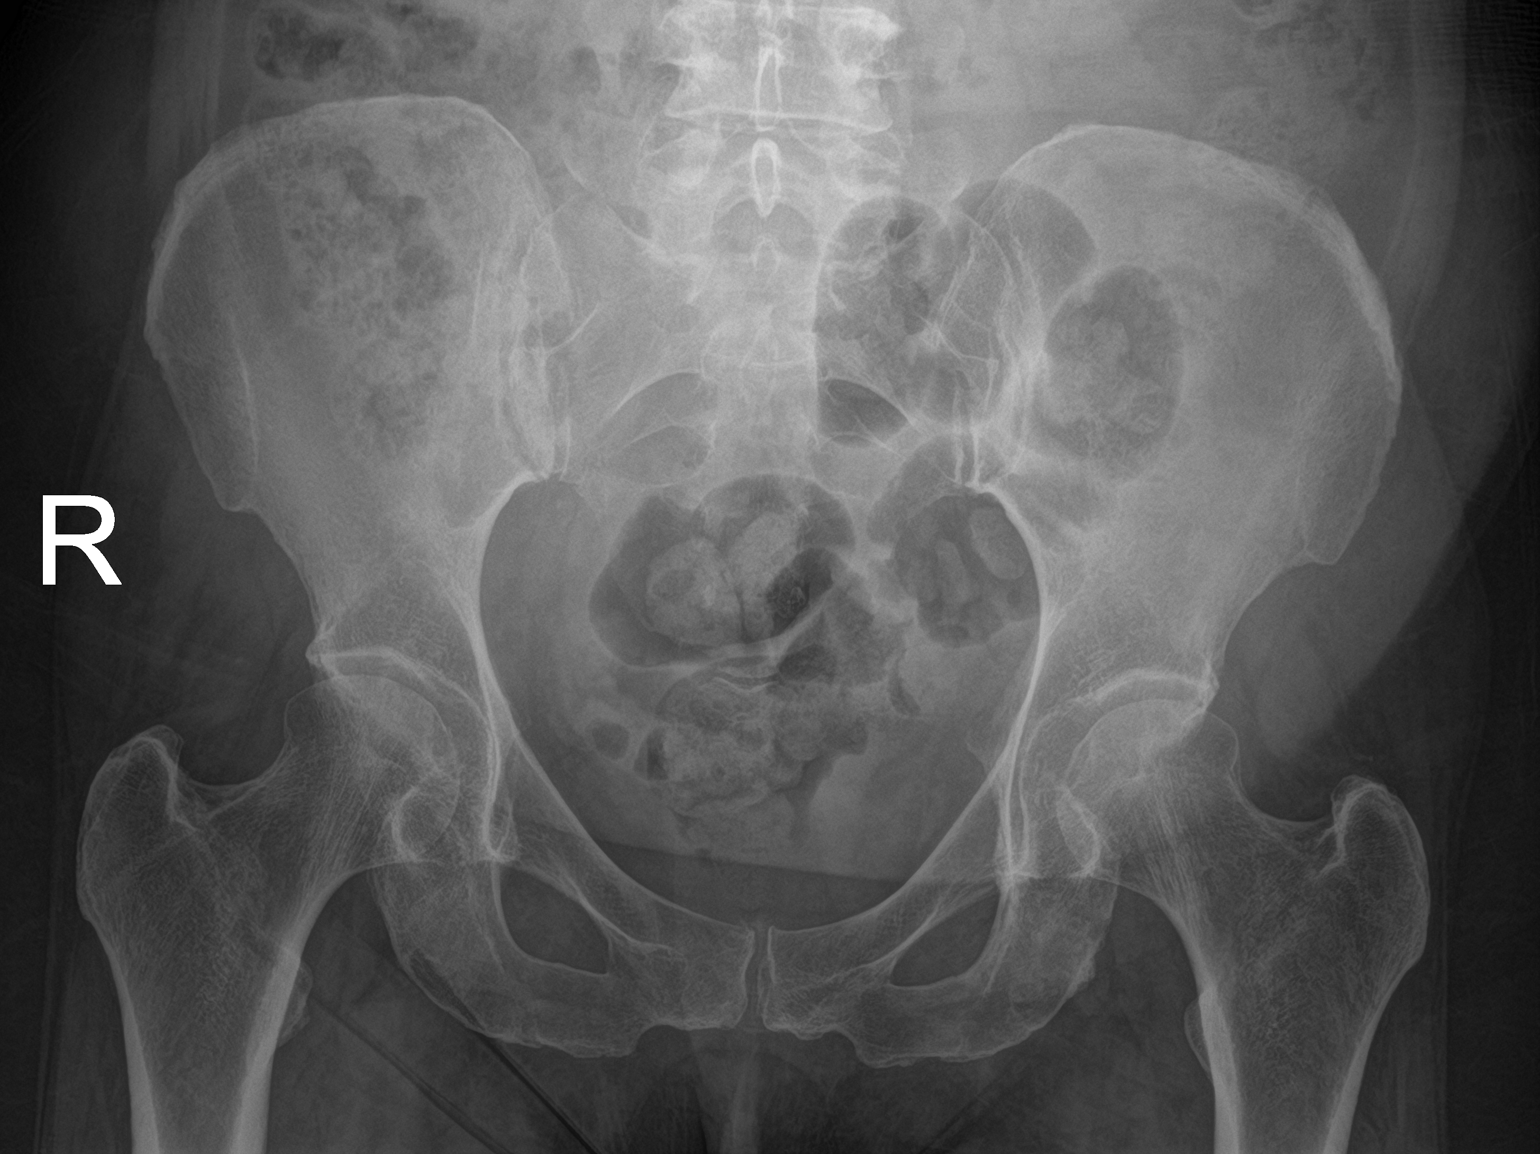

[3 of 3 positions shown; findings below may reference images not displayed]

FINDINGS: There is no evidence of hip fracture or dislocation. There is no
evidence of arthropathy or other focal bone abnormality.
IMPRESSION: Negative.

## 2018-08-08 NOTE — Telephone Encounter (Signed)
Patient has appt 11/13

## 2018-08-10 ENCOUNTER — Encounter: Payer: Self-pay | Admitting: Internal Medicine

## 2018-08-10 ENCOUNTER — Ambulatory Visit: Payer: Medicare Other | Admitting: Internal Medicine

## 2018-08-10 ENCOUNTER — Other Ambulatory Visit (INDEPENDENT_AMBULATORY_CARE_PROVIDER_SITE_OTHER): Payer: Medicare Other

## 2018-08-10 VITALS — BP 114/66 | HR 86 | Temp 97.7°F | Ht 65.0 in | Wt 168.0 lb

## 2018-08-10 DIAGNOSIS — R109 Unspecified abdominal pain: Secondary | ICD-10-CM | POA: Insufficient documentation

## 2018-08-10 DIAGNOSIS — R1032 Left lower quadrant pain: Secondary | ICD-10-CM

## 2018-08-10 LAB — URINALYSIS, ROUTINE W REFLEX MICROSCOPIC
Bilirubin Urine: NEGATIVE
Ketones, ur: NEGATIVE
Nitrite: NEGATIVE
Specific Gravity, Urine: >=1.03 — AB (ref 1.000–1.030)
Total Protein, Urine: NEGATIVE
Urine Glucose: NEGATIVE
Urobilinogen, UA: 0.2 (ref 0.0–1.0)
pH: 5 (ref 5.0–8.0)

## 2018-08-10 LAB — HEPATIC FUNCTION PANEL
ALT: 11 U/L (ref 0–35)
AST: 14 U/L (ref 0–37)
Albumin: 4.3 g/dL (ref 3.5–5.2)
Alkaline Phosphatase: 91 U/L (ref 39–117)
Bilirubin, Direct: 0 mg/dL (ref 0.0–0.3)
Total Bilirubin: 0.3 mg/dL (ref 0.2–1.2)
Total Protein: 7.2 g/dL (ref 6.0–8.3)

## 2018-08-10 LAB — CBC WITH DIFFERENTIAL/PLATELET
Basophils Absolute: 0.1 10*3/uL (ref 0.0–0.1)
Basophils Relative: 0.7 % (ref 0.0–3.0)
Eosinophils Absolute: 0.3 10*3/uL (ref 0.0–0.7)
Eosinophils Relative: 3.7 % (ref 0.0–5.0)
HCT: 39.1 % (ref 36.0–46.0)
Hemoglobin: 12.9 g/dL (ref 12.0–15.0)
Lymphocytes Relative: 36.2 % (ref 12.0–46.0)
Lymphs Abs: 3.1 10*3/uL (ref 0.7–4.0)
MCHC: 33 g/dL (ref 30.0–36.0)
MCV: 92.8 fl (ref 78.0–100.0)
Monocytes Absolute: 0.5 10*3/uL (ref 0.1–1.0)
Monocytes Relative: 6 % (ref 3.0–12.0)
Neutro Abs: 4.5 10*3/uL (ref 1.4–7.7)
Neutrophils Relative %: 53.4 % (ref 43.0–77.0)
Platelets: 277 10*3/uL (ref 150.0–400.0)
RBC: 4.21 Mil/uL (ref 3.87–5.11)
RDW: 14.1 % (ref 11.5–15.5)
WBC: 8.5 10*3/uL (ref 4.0–10.5)

## 2018-08-10 LAB — BASIC METABOLIC PANEL
BUN: 21 mg/dL (ref 6–23)
CO2: 30 mEq/L (ref 19–32)
Calcium: 9.6 mg/dL (ref 8.4–10.5)
Chloride: 108 mEq/L (ref 96–112)
Creatinine, Ser: 0.86 mg/dL (ref 0.40–1.20)
GFR: 70.26 mL/min (ref >60.00)
Glucose, Bld: 94 mg/dL (ref 70–99)
Potassium: 3.8 mEq/L (ref 3.5–5.1)
Sodium: 144 mEq/L (ref 135–145)

## 2018-08-10 LAB — LIPASE: Lipase: 33 U/L (ref 11.0–59.0)

## 2018-08-10 MED ORDER — HYDROCODONE-ACETAMINOPHEN 7.5-325 MG PO TABS: 1.0000 | ORAL_TABLET | Freq: Four times a day (QID) | ORAL | 0 refills | Status: DC | PRN

## 2018-08-10 NOTE — Assessment & Plan Note (Addendum)
With mod to severe left sided abd pain/side/lower back of unclear etiology; for labs and stat Ct abd/pelvis today, hydrocodone prn pain control, but consider LS spine MRI if negative and/or gabapentin trial  Note:  Total time for pt hx, exam, review of record with pt in the room, determination of diagnoses and plan for further eval and tx is > 40 min, with over 50% spent in coordination and counseling of patient including the differential dx, tx, further evaluation and other management of left sided abd pain

## 2018-08-10 NOTE — Patient Instructions (Signed)
Please take all new medication as prescribed - the pain medication  Please continue all other medications as before, and refills have been done if requested.  Please have the pharmacy call with any other refills you may need.  Please keep your appointments with your specialists as you may have planned  You will be contacted regarding the referral for: CT scan - to see PCC;s now  Please go to the LAB in the Basement (turn left off the elevator) for the tests to be done today  You will be contacted by phone if any changes need to be made immediately.  Otherwise, you will receive a letter about your results with an explanation, but please check with MyChart first.  Please remember to sign up for MyChart if you have not done so, as this will be important to you in the future with finding out test results, communicating by private email, and scheduling acute appointments online when needed.

## 2018-08-10 NOTE — Progress Notes (Signed)
Subjective:    Patient ID: Amber Hicks, female    DOB: [birth year], 67 y.o.   MRN: [id number]  HPI  Here to f;u with c/o pain to the left abd, side and lower back of unclear etiology persistent since last sat clinic visit where she declines referral to ED for labs/ct.  Did f/u on Monday with left hip film neg for acute.  Today c/o persistent pain to left abdomen, side and left lower back, somewhat to the left groin and left lateral hip area as well.  No recent trauma.  Denies urinary symptoms such as dysuria, frequency, urgency, flank pain, hematuria or n/v, fever, chills.  Denies worsening reflux, dysphagia, n/v, bowel change or blood.  Pain about 9/10.  Pt denies chest pain, increased sob or doe, wheezing, orthopnea, PND, increased LE swelling, palpitations, dizziness or syncope.  No LE pain/numbness/weakness though has much worse pain overall with putting any wt on the left leg to get up on the exam table.  Husband argues with her during exam and not overall supportive though he did drive her here. Past Medical History:  Diagnosis Date  . Depression 01/22/2012  . Hx of adenomatous colonic polyps 01/26/2015  . Hyperlipidemia 01/24/2012  . VITAMIN D DEFICIENCY 07/09/2010   Qualifier: Diagnosis of  By: Jenny Reichmann MD, Hunt Oris    Past Surgical History:  Procedure Laterality Date  . ceaserian section     1 time    reports that she has never smoked. She has never used smokeless tobacco. She reports that she does not drink alcohol or use drugs. family history includes Heart disease in her brother and mother. Allergies  Allergen Reactions  . Doxycycline Rash   Current Outpatient Medications on File Prior to Visit  Medication Sig Dispense Refill  . acetaminophen (TYLENOL) 325 MG tablet Take 650 mg by mouth every 6 (six) hours as needed for mild pain.    Marland Kitchen AFLURIA PRESERVATIVE FREE 0.5 ML SUSY Inject 1 Dose as directed once.  0  . alendronate (FOSAMAX) 70 MG tablet Take 1 tablet (70 mg total) by  mouth every 7 (seven) days. Take with a full glass of water on an empty stomach. 12 tablet 3  . lovastatin (MEVACOR) 40 MG tablet Take 1 tablet (40 mg total) by mouth at bedtime. 90 tablet 3  . Multiple Vitamin (MULTIVITAMIN) tablet Take 1 tablet by mouth daily.    . SUMAtriptan (IMITREX) 100 MG tablet Take 1 tablet (100 mg total) by mouth every 2 (two) hours as needed for migraine or headache. May repeat in 2 hours if headache persists or recurs. 10 tablet 5   No current facility-administered medications on file prior to visit.    Review of Systems  Constitutional: Negative for other unusual diaphoresis or sweats HENT: Negative for ear discharge or swelling Eyes: Negative for other worsening visual disturbances Respiratory: Negative for stridor or other swelling  Gastrointestinal: Negative for worsening distension or other blood Genitourinary: Negative for retention or other urinary change Musculoskeletal: Negative for other MSK pain or swelling Skin: Negative for color change or other new lesions Neurological: Negative for worsening tremors and other numbness  Psychiatric/Behavioral: Negative for worsening agitation or other fatigue All other system neg per pt    Objective:   Physical Exam BP 114/66   Pulse 86   Temp 97.7 F (36.5 C) (Oral)   Ht 5\' 5"  (1.651 m)   Wt 168 lb (76.2 kg)   SpO2 95%   BMI 27.96 kg/m  VS noted, mod ill appearing, somewhat tearful in pain, slow to move and pain worse to walk and get up on exam table Constitutional: Pt appears in NAD HENT: Head: NCAT.  Right Ear: External ear normal.  Left Ear: External ear normal.  Eyes: . Pupils are equal, round, and reactive to light. Conjunctivae and EOM are normal Nose: without d/c or deformity Neck: Neck supple. Gross normal ROM Cardiovascular: Normal rate and regular rhythm.   Pulmonary/Chest: Effort normal and breath sounds without rales or wheezing.  Abd:  Soft, ND, + BS, no organomegaly, mod tender to  LUQ/LLQ/left side as well as left lumbar paravertebral, with no swelling or rash and midline spine nontender Neurological: Pt is alert. At baseline orientation, motor grossly intact Skin: Skin is warm. No rashes, other new lesions, no LE edema Psychiatric: Pt behavior is normal without agitation  No other exam findings     Assessment & Plan:

## 2018-08-11 ENCOUNTER — Ambulatory Visit (INDEPENDENT_AMBULATORY_CARE_PROVIDER_SITE_OTHER)
Admission: RE | Admit: 2018-08-11 | Discharge: 2018-08-11 | Disposition: A | Discharge status: Home / Self Care | Payer: Medicare Other | Source: Ambulatory Visit | Attending: Internal Medicine | Admitting: Internal Medicine

## 2018-08-11 ENCOUNTER — Encounter: Payer: Self-pay | Admitting: Internal Medicine

## 2018-08-11 ENCOUNTER — Telehealth: Payer: Self-pay

## 2018-08-11 DIAGNOSIS — R1032 Left lower quadrant pain: Secondary | ICD-10-CM

## 2018-08-11 DIAGNOSIS — K449 Diaphragmatic hernia without obstruction or gangrene: Secondary | ICD-10-CM | POA: Diagnosis not present

## 2018-08-11 IMAGING — CT CT ABD-PELV W/ CM
2 of 5 series · 16 of 46 positions shown, 18 images · IV contrast (ISOVUE 300)
Comparison: [DATE] CT and other studies.

CLINICAL DATA: 67-year-old female with LEFT abdominal and pelvic
pain.

EXAM:
CT ABDOMEN AND PELVIS WITH CONTRAST
TECHNIQUE: Multidetector CT imaging of the abdomen and pelvis was performed
using the standard protocol following bolus administration of
intravenous contrast.
CONTRAST:  100mL [FU] IOPAMIDOL ([FU]) INJECTION 61%

[Series 2: abd/pel w · axial · 0.70mm/px · z∈[-445,-70]mm · 13 of 85 slices shown, 15 images]
[im 5/85  soft-tissue]
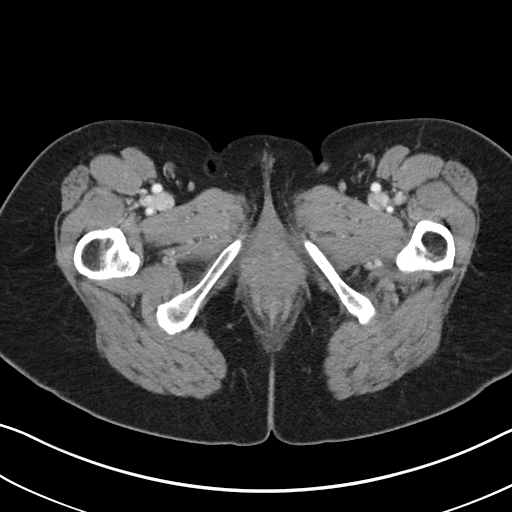
[im 5/85  bone]
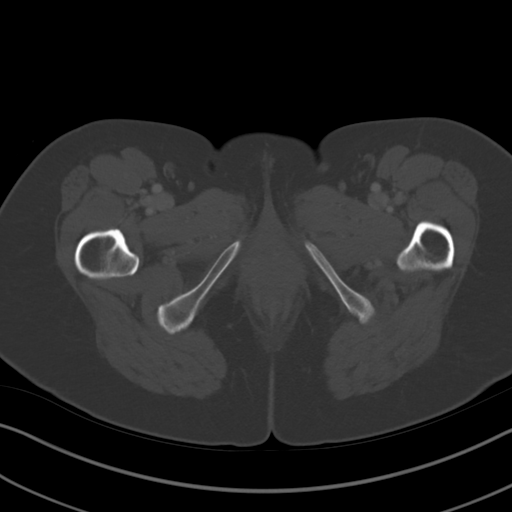
[im 10/85  soft-tissue]
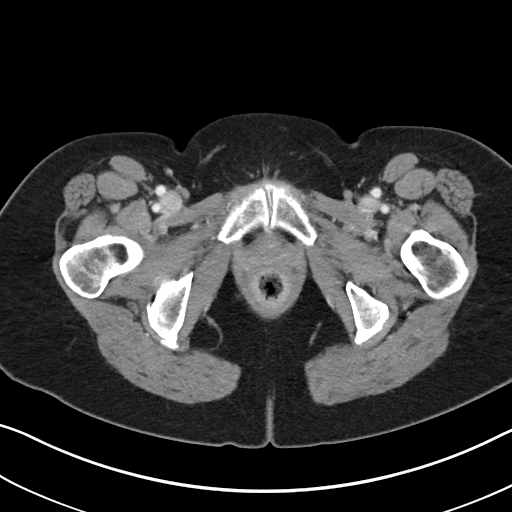
[im 19/85  soft-tissue]
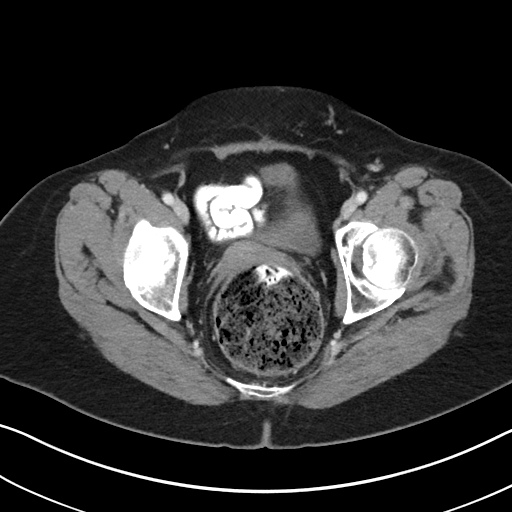
[im 24/85  soft-tissue]
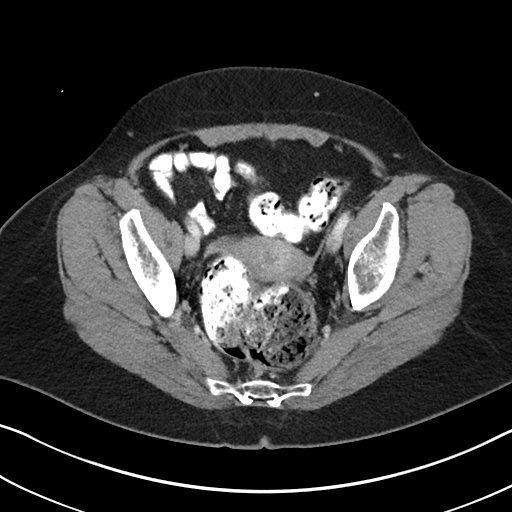
[im 29/85  soft-tissue]
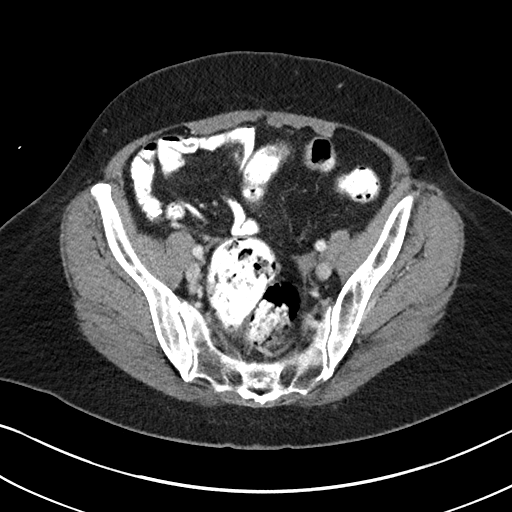
[im 38/85  soft-tissue]
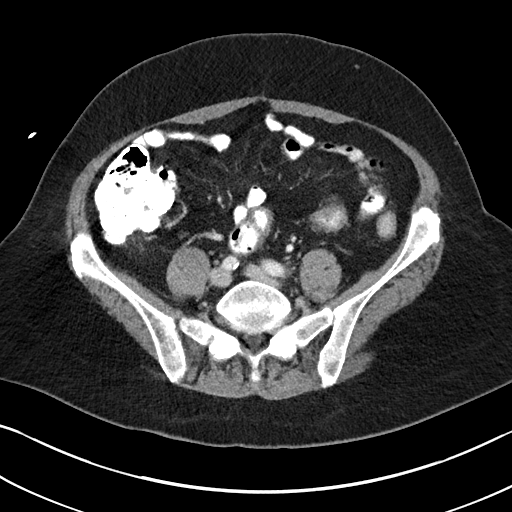
[im 43/85  soft-tissue]
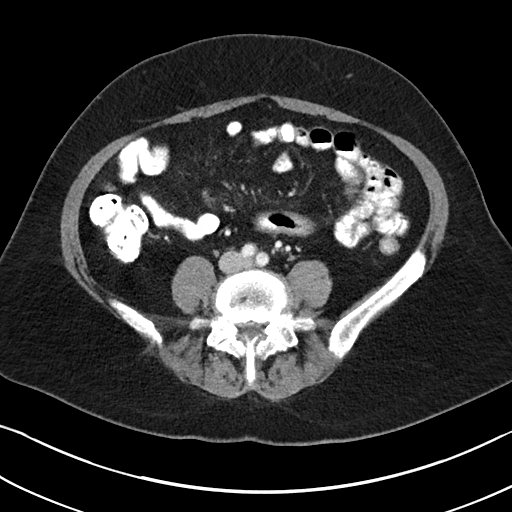
[im 47/85  soft-tissue]
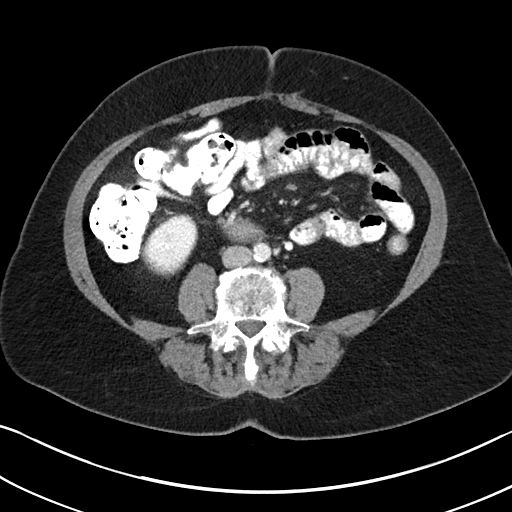
[im 57/85  soft-tissue]
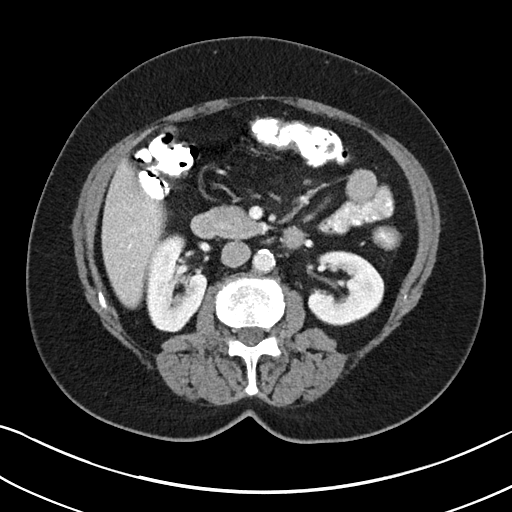
[im 57/85  bone]
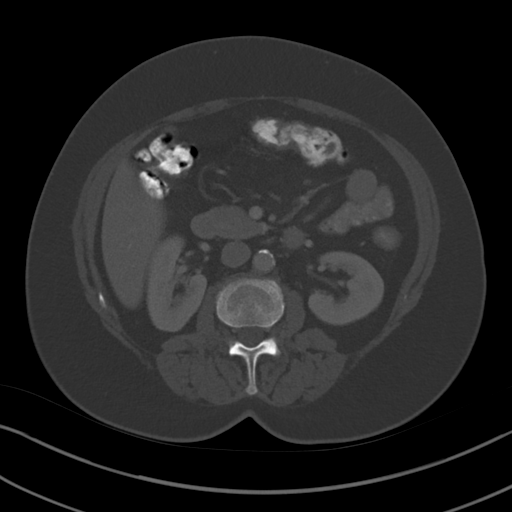
[im 61/85  soft-tissue]
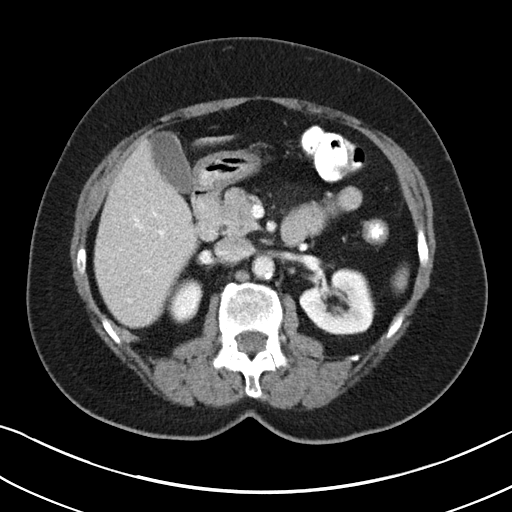
[im 66/85  soft-tissue]
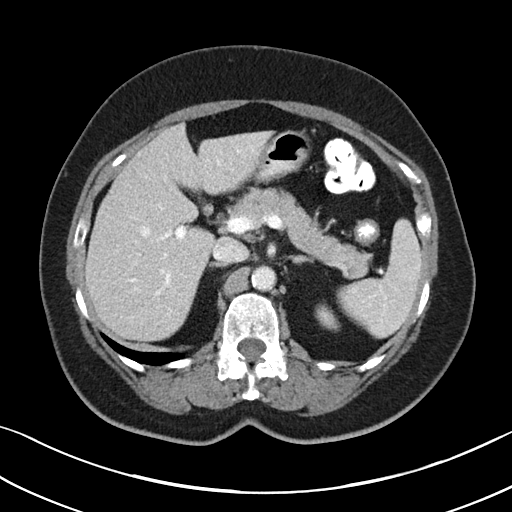
[im 75/85  soft-tissue]
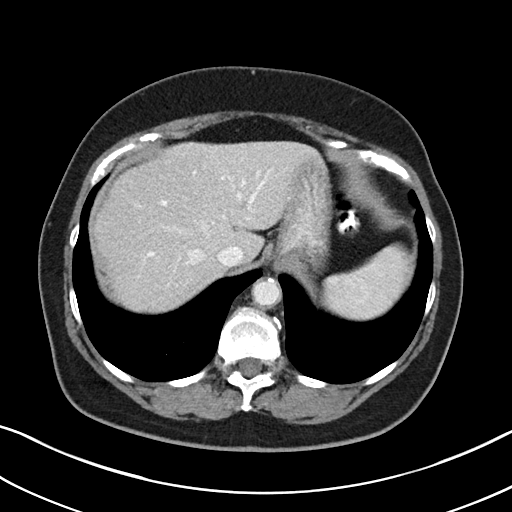
[im 80/85  soft-tissue]
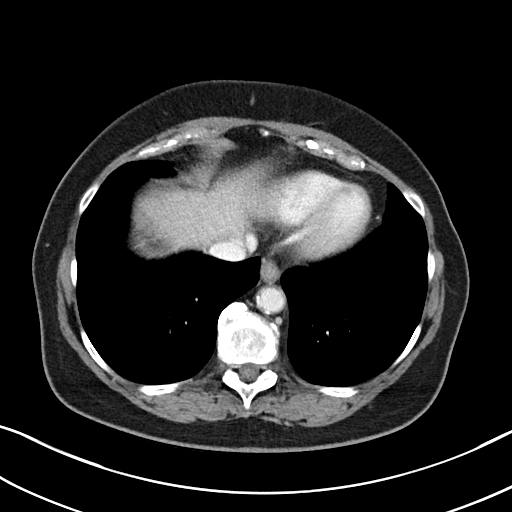

[Series 6: abd/pel w st · coronal · 0.69mm/px · 3 of 92 slices shown]
[im 31/92  soft-tissue]
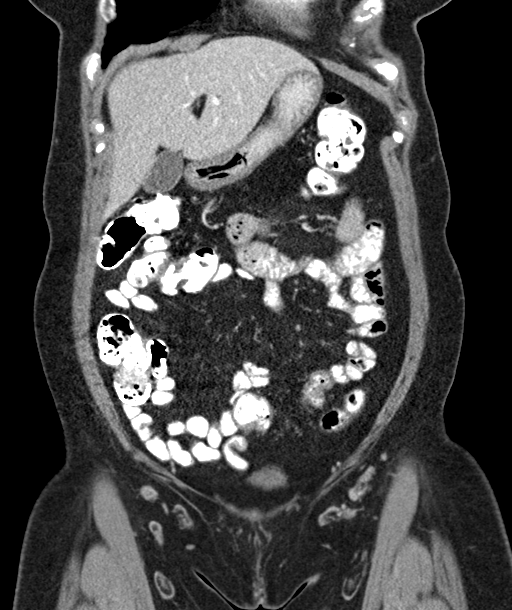
[im 41/92  soft-tissue]
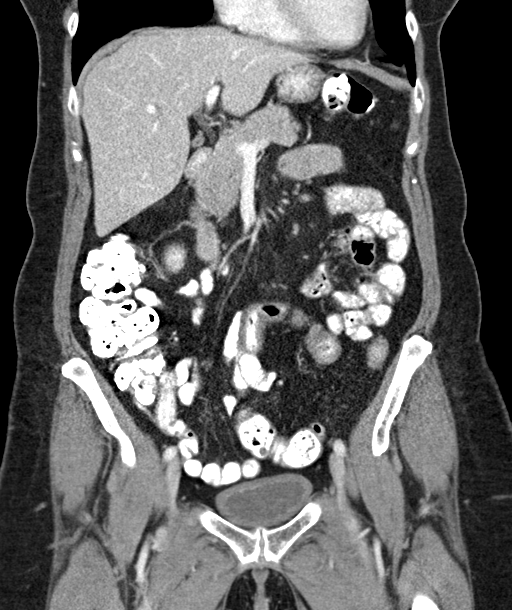
[im 51/92  soft-tissue]
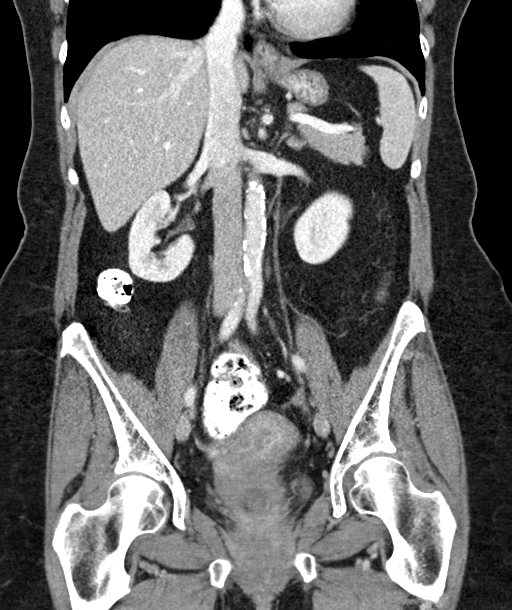

[16 of 46 positions shown; findings below may reference images not displayed]

FINDINGS: Lower chest: Unremarkable

Hepatobiliary: The liver and gallbladder are unremarkable. No
biliary dilatation.

Pancreas: Unremarkable

Spleen: Unremarkable

Adrenals/Urinary Tract: The kidneys, adrenal glands and bladder are
unremarkable.

Stomach/Bowel: Stomach is within normal limits. Appendix appears
normal. No evidence of bowel wall thickening, distention, or
inflammatory changes. The LEFT colon is not well distended. A large
amount of stool within the rectum is noted.

Vascular/Lymphatic: Aortic atherosclerosis. No enlarged abdominal or
pelvic lymph nodes.

Reproductive: Uterus and bilateral adnexa are unremarkable.

Other: No ascites, focal collection or pneumoperitoneum. A small
RIGHT inguinal hernia containing fat is noted.

Musculoskeletal: No acute or suspicious bony abnormalities are
identified. Mild degenerative disc disease at L4-5 identified.
IMPRESSION: 1. No CT findings to suggest a cause for this patient's LEFT
abdominal pain.
2. Large amount of rectal stool without other bowel abnormality.
3.  Aortic Atherosclerosis ([FU]-[FU]).

## 2018-08-11 MED ORDER — IOPAMIDOL (ISOVUE-300) INJECTION 61%
100.0000 mL | Freq: Once | INTRAVENOUS | Status: AC | PRN
  Administered 2018-08-11: 100 mL via INTRAVENOUS

## 2018-08-11 NOTE — Telephone Encounter (Signed)
Pt's husband has been informed of results and expressed understanding.

## 2018-08-11 NOTE — Telephone Encounter (Signed)
-----   Message from Biagio Borg, MD sent at 08/11/2018 12:02 PM EST ----- Ok to let pt know - CT negative for any acute problem; so make a ROV if the pain persists to next wk, as we may need an MRI of the back

## 2018-10-10 ENCOUNTER — Telehealth: Payer: Self-pay | Admitting: Internal Medicine

## 2018-10-10 MED ORDER — ALENDRONATE SODIUM 70 MG PO TABS: 70.0000 mg | ORAL_TABLET | ORAL | 3 refills | Status: DC

## 2018-10-10 NOTE — Telephone Encounter (Signed)
Patient is requesting more samples or a prescription called to pharmacy.

## 2018-10-10 NOTE — Telephone Encounter (Signed)
I dont remember ever having samples of this, please send to pharmacy

## 2018-10-10 NOTE — Telephone Encounter (Signed)
Will route to office for final disposition; pt last seen by Dr Cathlean Cower, [place], 08/10/2018

## 2018-10-10 NOTE — Telephone Encounter (Signed)
Copied from [place] [id number]. Topic: Quick Communication - See Telephone Encounter >> Oct 10, 2018 11:27 AM Ivar Drape wrote: CRM for notification. See Telephone encounter for: 10/10/18. Patient was given samples of Alendronate Sodium tablets 70mg  tablets to be taken once weekly.  The patient only has one left and would like to know if there are any more samples. If not can a prescription for this medication be called into her preferred pharmacy [facility] on [address]

## 2018-10-24 ENCOUNTER — Other Ambulatory Visit: Payer: Self-pay | Admitting: Internal Medicine

## 2018-10-24 DIAGNOSIS — Z1231 Encounter for screening mammogram for malignant neoplasm of breast: Secondary | ICD-10-CM

## 2018-11-22 ENCOUNTER — Ambulatory Visit: Payer: Medicare Other

## 2018-12-13 ENCOUNTER — Telehealth: Payer: Self-pay | Admitting: Internal Medicine

## 2018-12-13 NOTE — Telephone Encounter (Signed)
Copied from [place] [id number]. Topic: Quick Communication - Rx Refill/Question >> Dec 13, 2018  2:36 PM Alanda Slim E wrote: Medication: alendronate (FOSAMAX) 70 MG tablet  Has the patient contacted their pharmacy? No   Preferred Pharmacy (with phone number or street name): [id number] - [place], [address] [phone] (Phone) [phone] (Fax)    Agent: Please be advised that RX refills may take up to 3 business days. We ask that you follow-up with your pharmacy.

## 2018-12-14 ENCOUNTER — Ambulatory Visit: Payer: Medicare Other | Admitting: Internal Medicine

## 2018-12-14 MED ORDER — ALENDRONATE SODIUM 70 MG PO TABS: 70.0000 mg | ORAL_TABLET | ORAL | 1 refills | Status: DC

## 2018-12-15 ENCOUNTER — Ambulatory Visit: Payer: Medicare Other | Admitting: Internal Medicine

## 2018-12-15 MED ORDER — ALENDRONATE SODIUM 70 MG PO TABS: 70.0000 mg | ORAL_TABLET | ORAL | 1 refills | Status: DC

## 2018-12-15 NOTE — Telephone Encounter (Signed)
Pt stated she spoke with [facility] and they did not receive rx refill for alendronate (FOSAMAX) 70 MG tablet. She is requesting that rx instead be sent to  [facility], [place] [phone] (Phone) [phone] (Fax)

## 2018-12-15 NOTE — Addendum Note (Signed)
Addended by: Juliet Rude on: 12/15/2018 02:04 PM   Modules accepted: Orders

## 2018-12-19 ENCOUNTER — Telehealth: Payer: Self-pay

## 2018-12-19 ENCOUNTER — Ambulatory Visit (INDEPENDENT_AMBULATORY_CARE_PROVIDER_SITE_OTHER)
Admission: RE | Admit: 2018-12-19 | Discharge: 2018-12-19 | Disposition: A | Discharge status: Home / Self Care | Payer: Medicare Other | Source: Ambulatory Visit | Attending: Internal Medicine | Admitting: Internal Medicine

## 2018-12-19 ENCOUNTER — Encounter: Payer: Self-pay | Admitting: Internal Medicine

## 2018-12-19 ENCOUNTER — Other Ambulatory Visit: Payer: Self-pay

## 2018-12-19 ENCOUNTER — Ambulatory Visit (INDEPENDENT_AMBULATORY_CARE_PROVIDER_SITE_OTHER): Payer: Medicare Other | Admitting: Internal Medicine

## 2018-12-19 VITALS — BP 126/82 | HR 82 | Temp 98.1°F | Ht 65.0 in | Wt 165.0 lb

## 2018-12-19 DIAGNOSIS — F329 Major depressive disorder, single episode, unspecified: Secondary | ICD-10-CM

## 2018-12-19 DIAGNOSIS — M79645 Pain in left finger(s): Secondary | ICD-10-CM | POA: Diagnosis not present

## 2018-12-19 DIAGNOSIS — F32A Depression, unspecified: Secondary | ICD-10-CM

## 2018-12-19 DIAGNOSIS — M25571 Pain in right ankle and joints of right foot: Secondary | ICD-10-CM | POA: Diagnosis not present

## 2018-12-19 IMAGING — DX RIGHT ANKLE - COMPLETE 3+ VIEW
3 series · 3 of 3 positions shown · non-contrast
Comparison: [DATE]

CLINICAL DATA: Chronic intermittent right ankle pain laterally.

EXAM:
RIGHT ANKLE - COMPLETE 3+ VIEW

[ankle ap]
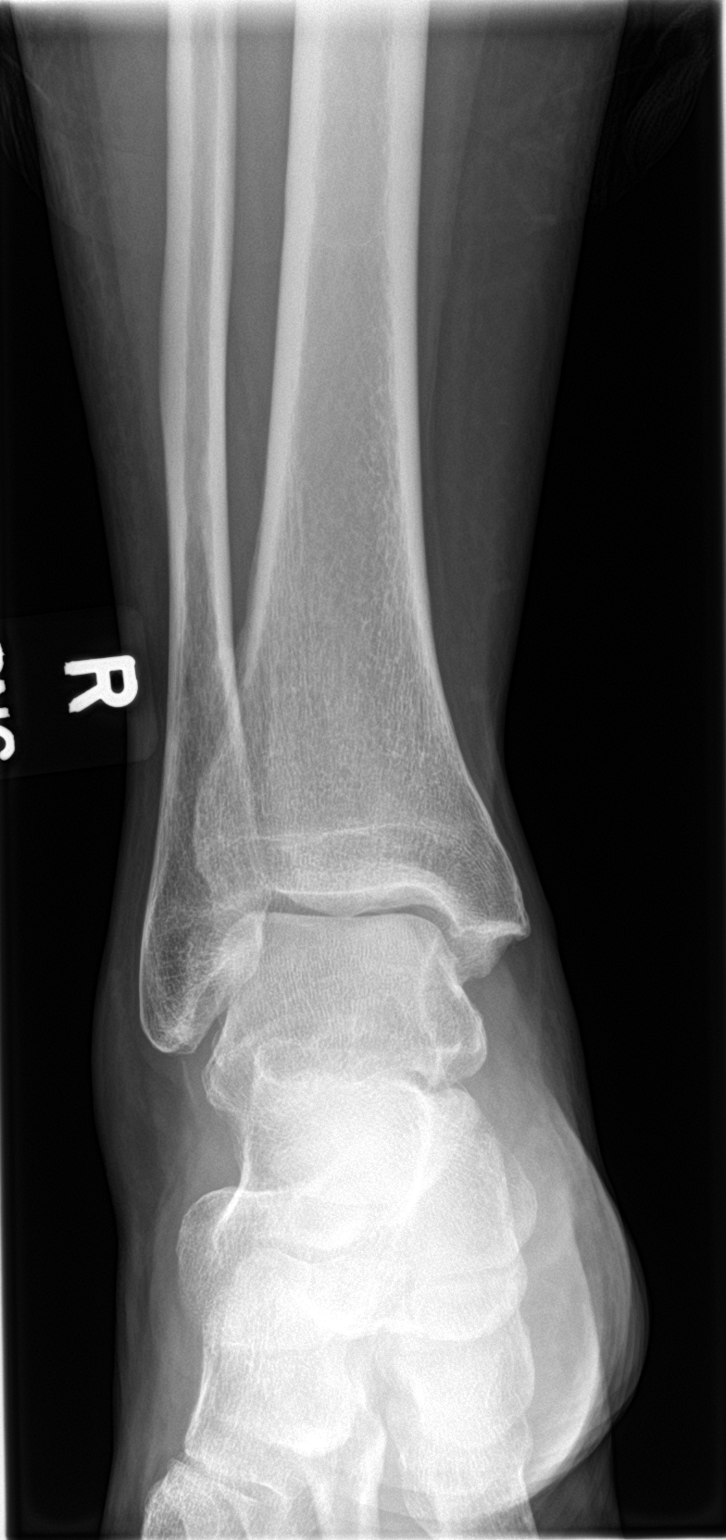

[ankle obl]
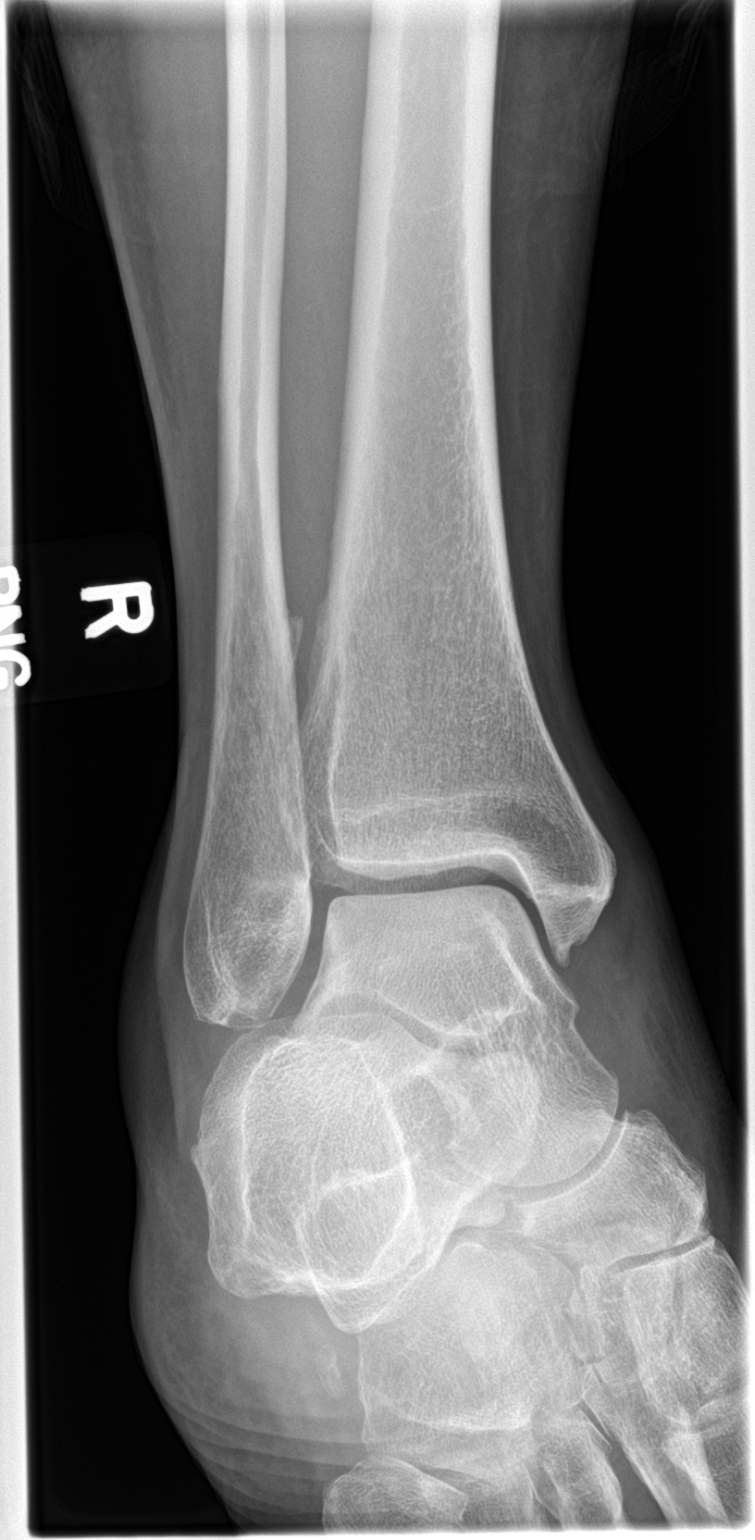

[ankle lat]
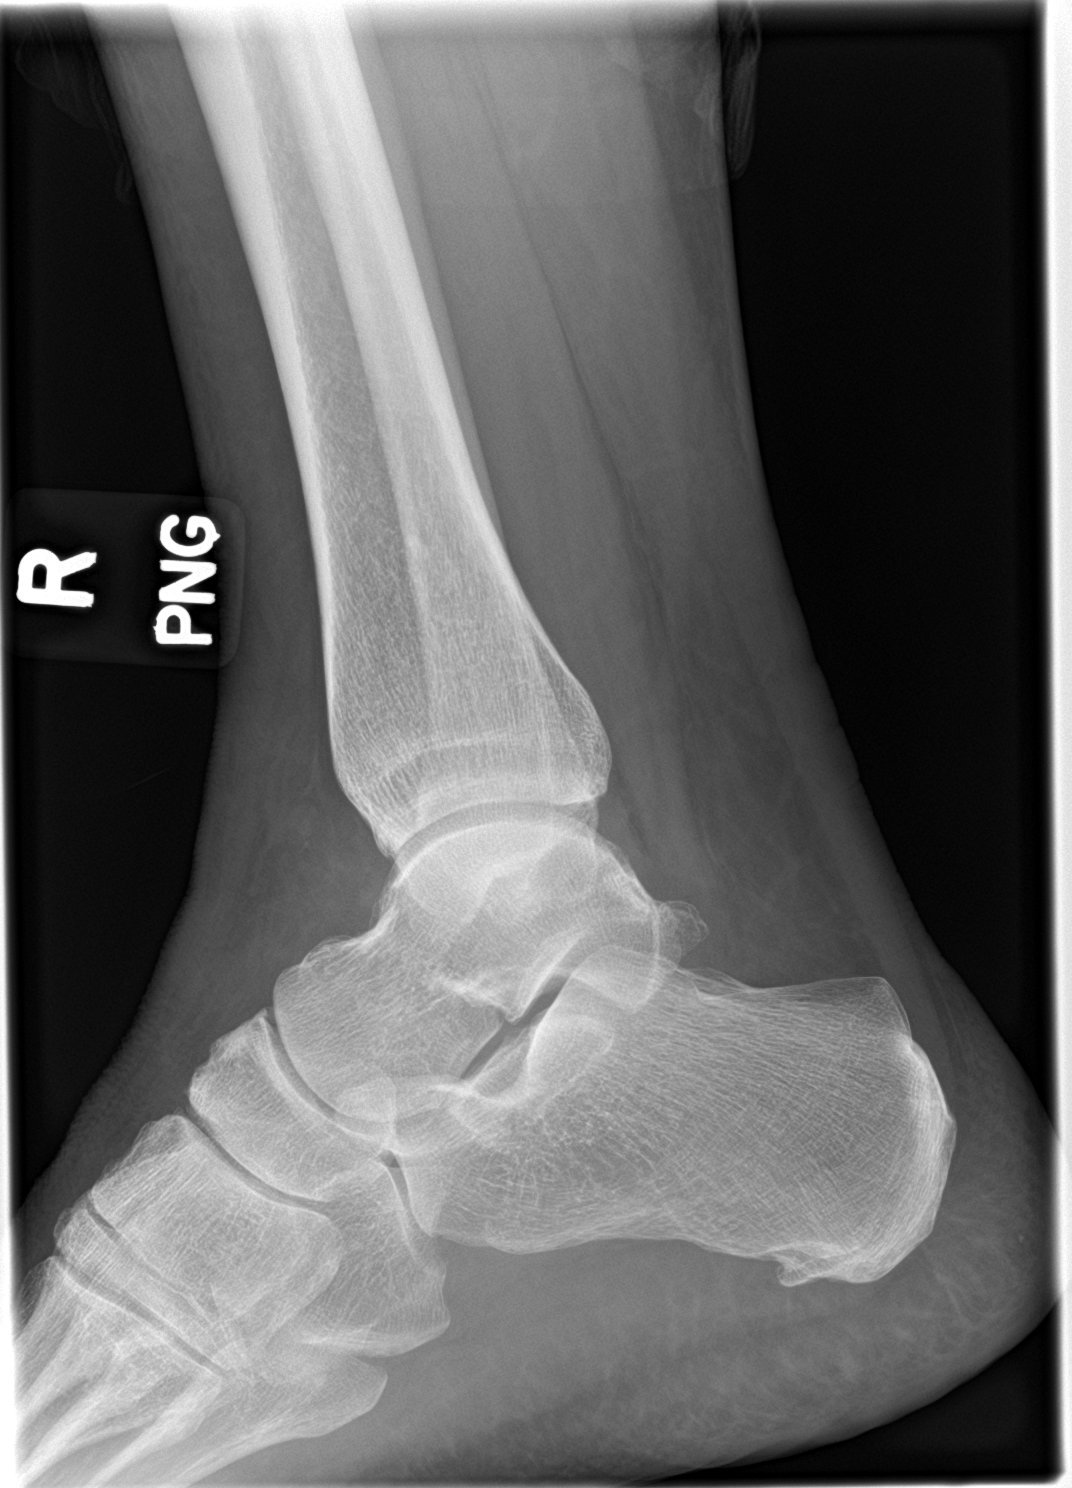

[3 of 3 positions shown; findings below may reference images not displayed]

FINDINGS: Mild lateral soft tissue swelling. No fractures or other
abnormalities.
IMPRESSION: Mild lateral soft tissue swelling.  No bony abnormality identified.

## 2018-12-19 IMAGING — DX LEFT MIDDLE FINGER 2+V
3 series · 3 of 3 positions shown · non-contrast
Comparison: None.

CLINICAL DATA: Injury to left middle finger with pain for a week.

EXAM:
LEFT MIDDLE FINGER 2+V

[finger ap]
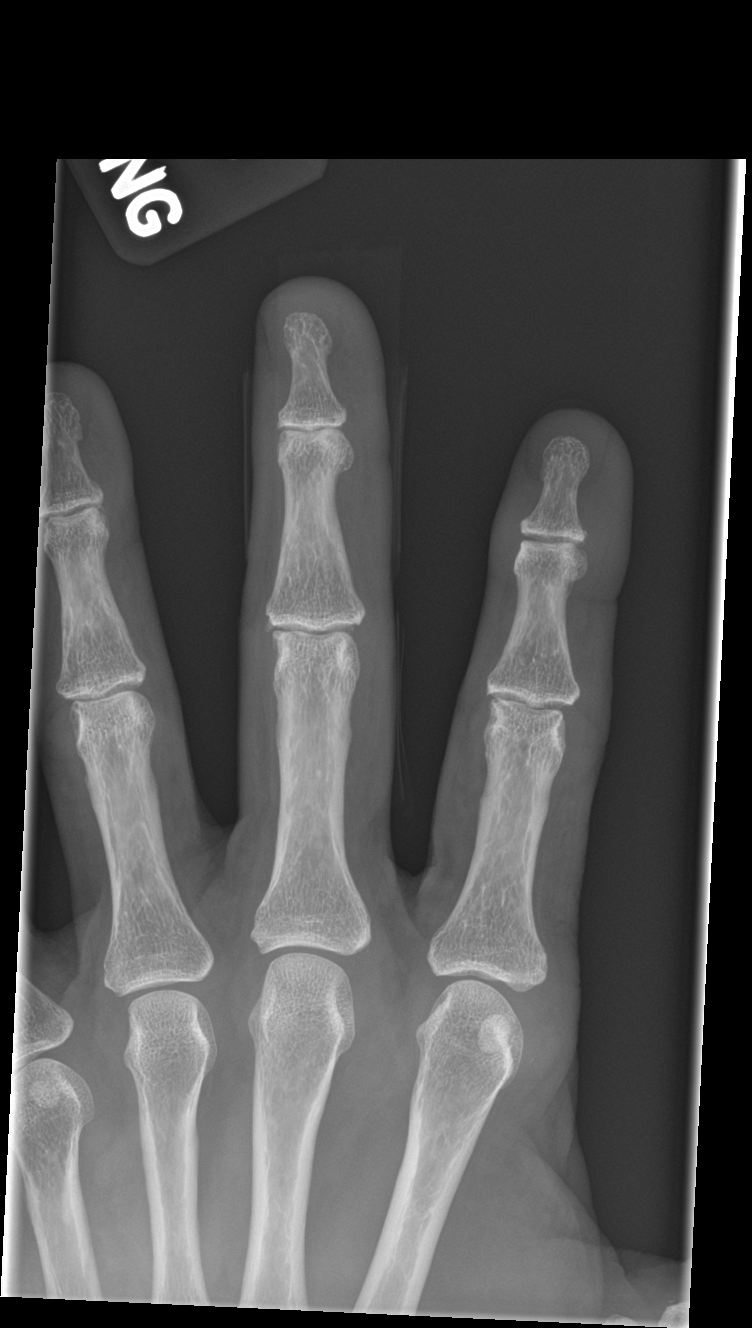

[finger obl]
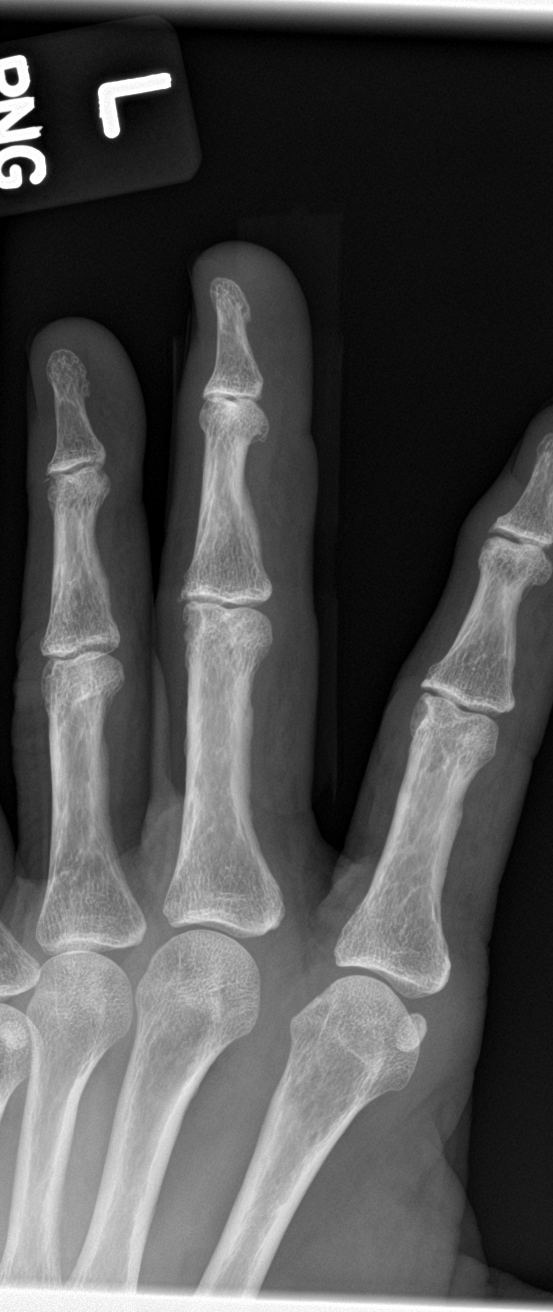

[finger lat]
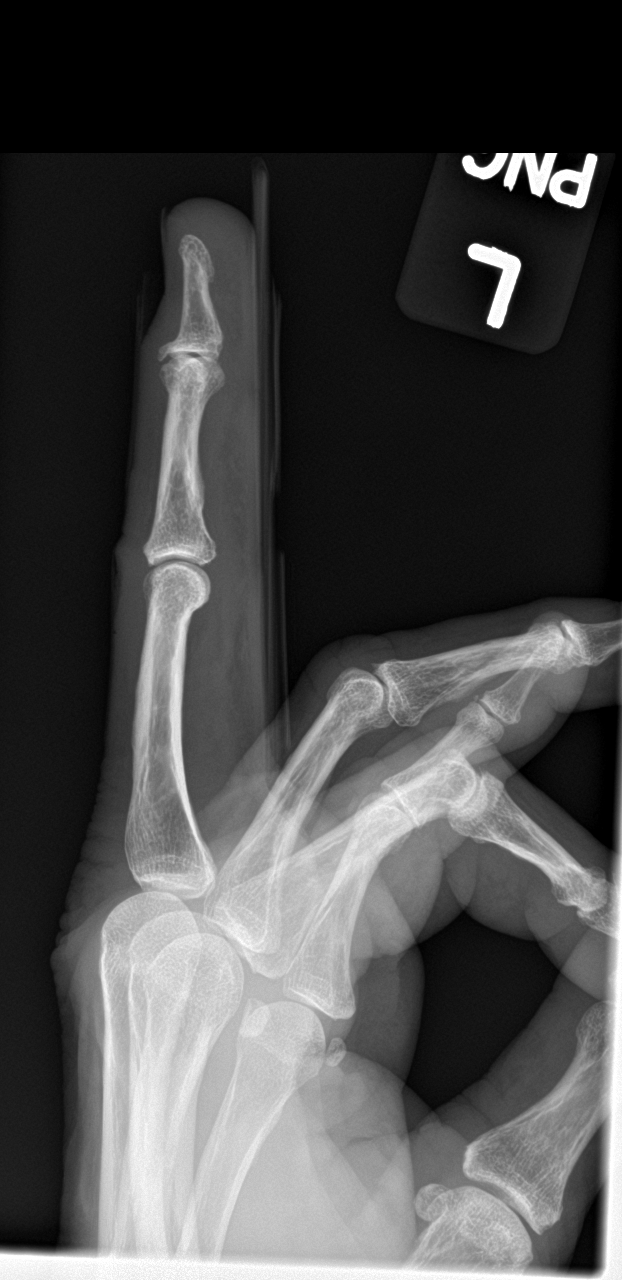

[3 of 3 positions shown; findings below may reference images not displayed]

FINDINGS: Degenerative changes in the distal and proximal interphalangeal
joints. No fractures or dislocations.
IMPRESSION: Degenerative changes.  No other acute abnormalities.

## 2018-12-19 MED ORDER — KETOROLAC TROMETHAMINE 30 MG/ML IJ SOLN
30.0000 mg | Freq: Once | INTRAMUSCULAR | Status: AC
  Administered 2018-12-19: 30 mg via INTRAMUSCULAR

## 2018-12-19 MED ORDER — HYDROCODONE-ACETAMINOPHEN 7.5-325 MG PO TABS: 1.0000 | ORAL_TABLET | Freq: Four times a day (QID) | ORAL | 0 refills | Status: DC | PRN

## 2018-12-19 MED ORDER — ALENDRONATE SODIUM 70 MG PO TABS: 70.0000 mg | ORAL_TABLET | ORAL | 1 refills | Status: DC

## 2018-12-19 NOTE — Assessment & Plan Note (Addendum)
Suspect tendon rupture vs severe strain to left middle finger, for pain control, toradol 30 mg IM today, xray today, and refer to hand surgury

## 2018-12-19 NOTE — Telephone Encounter (Signed)
No COVID-19 symptoms No fever, SOB, cough, n/v, chills  Pt stated that she has been having issues with her left middle finger and right ankle. Her finger starting giving her an issue for about a week. Her ankle has been an ongoing issue. Advised to keep appt. Informed no additional visitors.

## 2018-12-19 NOTE — Patient Instructions (Signed)
You had the toradol 30 mg pain shot today  You are given the splint today for the finger  Please take all new medication as prescribed  - the hydrocodone as needed for pain (and call in 1 wk if still need this)  You will be contacted regarding the referral for: Hand Surgury (to see [facility] now)  Please continue all other medications as before, and refills have been done if requested.  Please have the pharmacy call with any other refills you may need.  Please keep your appointments with your specialists as you may have planned  Please go to the XRAY Department in the Basement (go straight as you get off the elevator) for the x-ray testing  You will be contacted by phone if any changes need to be made immediately.  Otherwise, you will receive a letter about your results with an explanation, but please check with MyChart first.  Please remember to sign up for MyChart if you have not done so, as this will be important to you in the future with finding out test results, communicating by private email, and scheduling acute appointments online when needed.

## 2018-12-19 NOTE — Progress Notes (Signed)
Subjective:    Patient ID: Amber Hicks, female    DOB: May 22, 1951, 68 y.o.   MRN: [id number]  HPI  Here with acute onset 1 wk severe constant sharp left middle finger pain primarily localized at the PIP joint with some finger swelling between the PIP and MCP; persists despite a homemade finger splint, also with some numbness with the swelling.  Has even worse severe pain with any attempt to flex at the PIP, or touch the proximal finger.   Started after was disciplining the dog with her hand on the back when the dog turned quickly wearing a cone about the neck and hit her finger somehow, not sure exactly how hit b/c happened so fast.   Also c/o mild intermittent sharp Rrght ankle pains for many months after several missteps in the past and sprains, and noticed a nontender swelling near the lateral malleolus, so wondering if might be related as well.  Pt denies chest pain, increased sob or doe, wheezing, orthopnea, PND, increased LE swelling, palpitations, dizziness or syncope.  Pt denies new neurological symptoms such as new headache, or facial or extremity weakness or numbness   Pt denies polydipsia, polyuria Denies worsening depressive symptoms, suicidal ideation, or panic Past Medical History:  Diagnosis Date  . Depression 01/22/2012  . Hx of adenomatous colonic polyps 01/26/2015  . Hyperlipidemia 01/24/2012  . VITAMIN D DEFICIENCY 07/09/2010   Qualifier: Diagnosis of  By: Jenny Reichmann MD, Hunt Oris    Past Surgical History:  Procedure Laterality Date  . ceaserian section     1 time    reports that she has never smoked. She has never used smokeless tobacco. She reports that she does not drink alcohol or use drugs. family history includes Heart disease in her brother and mother. Allergies  Allergen Reactions  . Doxycycline Rash   Current Outpatient Medications on File Prior to Visit  Medication Sig Dispense Refill  . acetaminophen (TYLENOL) 325 MG tablet Take 650 mg by mouth every 6 (six) hours  as needed for mild pain.    Marland Kitchen AFLURIA PRESERVATIVE FREE 0.5 ML SUSY Inject 1 Dose as directed once.  0  . lovastatin (MEVACOR) 40 MG tablet Take 1 tablet (40 mg total) by mouth at bedtime. 90 tablet 3  . Multiple Vitamin (MULTIVITAMIN) tablet Take 1 tablet by mouth daily.    . SUMAtriptan (IMITREX) 100 MG tablet Take 1 tablet (100 mg total) by mouth every 2 (two) hours as needed for migraine or headache. May repeat in 2 hours if headache persists or recurs. 10 tablet 5   No current facility-administered medications on file prior to visit.    Review of Systems  Constitutional: Negative for other unusual diaphoresis or sweats HENT: Negative for ear discharge or swelling Eyes: Negative for other worsening visual disturbances Respiratory: Negative for stridor or other swelling  Gastrointestinal: Negative for worsening distension or other blood Genitourinary: Negative for retention or other urinary change Musculoskeletal: Negative for other MSK pain or swelling Skin: Negative for color change or other new lesions Neurological: Negative for worsening tremors and other numbness  Psychiatric/Behavioral: Negative for worsening agitation or other fatigue All other system neg per pt    Objective:   Physical Exam BP 126/82   Pulse 82   Temp 98.1 F (36.7 C) (Oral)   Ht 5\' 5"  (1.651 m)   Wt 165 lb (74.8 kg)   SpO2 96%   BMI 27.46 kg/m  VS noted, in severe pain with any movement  of the left middle finger Constitutional: Pt appears in NAD HENT: Head: NCAT.  Right Ear: External ear normal.  Left Ear: External ear normal.  Eyes: . Pupils are equal, round, and reactive to light. Conjunctivae and EOM are normal Nose: without d/c or deformity Neck: Neck supple. Gross normal ROM Cardiovascular: Normal rate and regular rhythm.   Pulmonary/Chest: Effort normal and breath sounds without rales or wheezing.  Left third middle finger extended and not able to flex at all due to severe pain, has marked  tenderness and mod swelling to the proximal dorsal finger between the PIP and MCP, some faint bruising to the lateral aspect of finger noted, o/w neurovasc intact Rght ankle with mild bony degenerative change without effusion or tenderness, and 1 cm lipoma like mass noted just distal posterior to the lateral malleolus Neurological: Pt is alert. At baseline orientation, motor grossly intact Skin: Skin is warm. No rashes, other new lesions, no LE edema, not depressed affect Psychiatric: Pt behavior is normal without agitation  No other exam findings Lab Results  Component Value Date   WBC 8.5 08/10/2018   HGB 12.9 08/10/2018   HCT 39.1 08/10/2018   PLT 277.0 08/10/2018   GLUCOSE 94 08/10/2018   CHOL 243 (H) 06/16/2018   TRIG 167.0 (H) 06/16/2018   HDL 42.80 06/16/2018   LDLDIRECT 163.0 01/22/2012   LDLCALC 167 (H) 06/16/2018   ALT 11 08/10/2018   AST 14 08/10/2018   NA 144 08/10/2018   K 3.8 08/10/2018   CL 108 08/10/2018   CREATININE 0.86 08/10/2018   BUN 21 08/10/2018   CO2 30 08/10/2018   TSH 3.48 06/16/2018      Assessment & Plan:

## 2018-12-19 NOTE — Assessment & Plan Note (Signed)
Exam c/w mild right ankle DJD which likely accounts for the intermittent pain, as well as an incidental lipoma like mass, d/w pt, for xray but hold on other specific tx at this time

## 2018-12-19 NOTE — Assessment & Plan Note (Signed)
stable overall by history and exam, recent data reviewed with pt, and pt to continue medical treatment as before,  to f/u any worsening symptoms or concerns  

## 2018-12-20 ENCOUNTER — Telehealth: Payer: Self-pay

## 2018-12-20 NOTE — Telephone Encounter (Signed)
Pt has been informed that she can find the "finger cot" as instructed by PCP from her pharmacy. I reiterated instructions to patient and she expressed understanding.   Copied from [place] [id number]. Topic: General - Other >> Dec 20, 2018  3:53 PM Leward Quan A wrote: Reason for CRM: Patient called to say that Dr promised her a stent for her finger. She is asking what to do because she need the stent. Ph# [phone] >> Dec 20, 2018  4:13 PM Cox, Melburn Hake, CMA wrote: Routed to wrong practice. Forwarding to [employer].

## 2018-12-22 ENCOUNTER — Ambulatory Visit: Payer: Medicare Other

## 2018-12-26 ENCOUNTER — Telehealth (INDEPENDENT_AMBULATORY_CARE_PROVIDER_SITE_OTHER): Payer: Self-pay

## 2018-12-26 NOTE — Telephone Encounter (Signed)
Called patient and asked the screening questions.  Do you have now or have you had in the past 7 days a fever and/or chills? NO  Do you have now or have you had in the past 7 days a cough? NO  Do you have now or have yo0u had in the last 7 days nausea, vomiting or abdominal pain? NO  Have you been exposed to anyone who has tested positive for COVID-19? NO  Have you or anyone who lives with you traveled within the last month? NO 

## 2018-12-27 ENCOUNTER — Ambulatory Visit (INDEPENDENT_AMBULATORY_CARE_PROVIDER_SITE_OTHER): Payer: Self-pay | Admitting: Orthopaedic Surgery

## 2018-12-28 ENCOUNTER — Ambulatory Visit (INDEPENDENT_AMBULATORY_CARE_PROVIDER_SITE_OTHER): Payer: Self-pay | Admitting: Orthopaedic Surgery

## 2018-12-29 ENCOUNTER — Ambulatory Visit: Payer: Self-pay

## 2018-12-29 NOTE — Telephone Encounter (Signed)
Pt reported rusty nail puncture to the between the right ring finger and the little finger. Pt is in severe pain and was crying. Pt tated she picked up and piece of wood and that's when the nail punctured her hand. Pt stated the pain is so severe that it is radiating up her arm.  Pt stated that there was a lot of blood and her husband told her to wash it in hot water. Pt had gotten the bleeding to stop prior to calling.  Pt's last tetanus was 01/22/12. Pt stated she can move her ring finger "a little bit." Care advice given and advised pt to go to [facility]. Pt verbalized understanding.   Reason for Disposition . [1] SEVERE pain AND [2] not improved 2 hours after pain medicine/ice packs . Puncture wound from a sharp object that was very dirty  Additional Information . Negative: [1] SEVERE pain AND [2] not improved 2 hours after pain medicine    Pt in severe pain . Negative: [1] Last tetanus shot > 5 years ago AND [2] DIRTY puncture (e.g., object OR skin was dirty, objects on ground/floor)    Last tetanus 01/22/12  Answer Assessment - Initial Assessment Questions 1. LOCATION: "Where is the puncture located?"      Between ring finger and little finger 2. OBJECT: "What was the object that punctured the skin?"      Rusty nail 3. DEPTH: "How deep do you think the puncture goes?"      Pt stated there is a 1/4 inch hole where the nail punctured her skin 4. ONSET: "When did the injury occur?" (Minutes or hours)     Pt was picking up a piece of wood and there was a nail on the other side of the wood 5. PAIN: "Is it painful?" If so, ask: "How bad is the pain?"  (Scale 1-10; or mild, moderate, severe)     Pt was crying intermittently-severe pan . TETANUS: "When was the last tetanus booster?"    01/22/2012 7. PREGNANCY: "Is there any chance you are pregnant?" "When was your last menstrual period?"     n/a  Answer Assessment - Initial Assessment Questions 1. MECHANISM: "How did the injury happen?"  Picked up wood and nail punctured skin between right ring finger and little finger 2. ONSET: "When did the injury happen?" (Minutes or hours ago)      4 pm 3. APPEARANCE of INJURY: "What does the injury look like?"      Hole between ring finger and pinky finger 4. SEVERITY: "Can you use the hand normally?" "Can you bend your fingers into a ball and then fully open them?"     Hurts to bend finger- but stated she can move it "a little bit." 5. SIZE: For cuts, bruises, or swelling, ask: "How large is it?" (e.g., inches or centimeters;  entire hand or wrist)      1/4 inch 6. PAIN: "Is there pain?" If so, ask: "How bad is the pain?"  (Scale 1-10; or mild, moderate, severe)     severe 7. TETANUS: For any breaks in the skin, ask: "When was the last tetanus booster?"     01/22/2012 8. OTHER SYMPTOMS: "Do you have any other symptoms?"      no 9. PREGNANCY: "Is there any chance you are pregnant?" "When was your last menstrual period?"     n/a  Protocols used: HAND AND WRIST [place], [place]

## 2018-12-30 NOTE — Telephone Encounter (Signed)
Noted  

## 2019-01-03 ENCOUNTER — Telehealth (INDEPENDENT_AMBULATORY_CARE_PROVIDER_SITE_OTHER): Payer: Self-pay

## 2019-01-03 NOTE — Telephone Encounter (Signed)
Called patient and asked the screening questions.  Do you have now or have you had in the past 7 days a fever and/or chills? NO  Do you have now or have you had in the past 7 days a cough? NO  Do you have now or have you had in the last 7 days nausea, vomiting or abdominal pain? NO  Have you been exposed to anyone who has tested positive for COVID-19? NO  Have you or anyone who lives with you traveled within the last month? NO 

## 2019-01-04 ENCOUNTER — Encounter (INDEPENDENT_AMBULATORY_CARE_PROVIDER_SITE_OTHER): Payer: Self-pay | Admitting: Orthopaedic Surgery

## 2019-01-04 ENCOUNTER — Other Ambulatory Visit: Payer: Self-pay

## 2019-01-04 ENCOUNTER — Ambulatory Visit (INDEPENDENT_AMBULATORY_CARE_PROVIDER_SITE_OTHER): Payer: Medicare Other | Admitting: Orthopaedic Surgery

## 2019-01-04 DIAGNOSIS — M79645 Pain in left finger(s): Secondary | ICD-10-CM | POA: Diagnosis not present

## 2019-01-04 DIAGNOSIS — M79641 Pain in right hand: Secondary | ICD-10-CM | POA: Insufficient documentation

## 2019-01-04 NOTE — Progress Notes (Signed)
Office Visit Note   Patient: Amber Hicks           Date of Birth: April 25, 1951           MRN: [id number] Visit Date: 01/04/2019              Requested by: Biagio Borg, MD [place], [place] [id number] PCP: Biagio Borg, MD   Assessment & Plan: Visit Diagnoses:  1. Pain of left middle finger   2. Pain of right hand     Plan: Impression is left middle finger sprain and healed the right hand puncture wound.  The puncture wound has fully healed and this appears to be doing just fine without any issues.  In terms of the left middle finger I think she has developed stiffness from wearing the AlumaFoam splint continuously.  The stiffness is causing her pain in my opinion.  I would like to get her into hand therapy to correct this issue.  The puncture wound has healed up fine and should not be an issue.  I would like to recheck her in 4 weeks for her left middle finger.  Follow-Up Instructions: Return in about 4 weeks (around 02/01/2019).   Orders:  No orders of the defined types were placed in this encounter.  No orders of the defined types were placed in this encounter.     Procedures: No procedures performed   Clinical Data: No additional findings.   Subjective: Chief Complaint  Patient presents with  . Left Hand - Pain    Middle Finger     Amber Hicks is a 68 year old female comes in for evaluation of her left middle finger pain as well as right hand puncture wound from a nail that she sustained a couple weeks ago.  In terms of the left middle finger she states that she jammed it when she was taking care of her dog and she originally saw her PCP who placed her in a AlumaFoam splint and she has been wearing this for the last 3 weeks.  She states that she is unable to move her finger due to pain and swelling.  She also endorses a numbness feeling her finger.  As for the right hand she has a healed puncture wound in the fourth webspace which does not appear to be  infected.  This is slightly tender to palpation.   Review of Systems  Constitutional: Negative.   HENT: Negative.   Eyes: Negative.   Respiratory: Negative.   Cardiovascular: Negative.   Endocrine: Negative.   Musculoskeletal: Negative.   Neurological: Negative.   Hematological: Negative.   Psychiatric/Behavioral: Negative.   All other systems reviewed and are negative.    Objective: Vital Signs: There were no vitals taken for this visit.  Physical Exam Vitals signs and nursing note reviewed.  Constitutional:      Appearance: She is well-developed.  HENT:     Head: Normocephalic and atraumatic.  Neck:     Musculoskeletal: Neck supple.  Pulmonary:     Effort: Pulmonary effort is normal.  Abdominal:     Palpations: Abdomen is soft.  Skin:    General: Skin is warm.     Capillary Refill: Capillary refill takes less than 2 seconds.  Neurological:     Mental Status: She is alert and oriented to person, place, and time.  Psychiatric:        Behavior: Behavior normal.        Thought Content:  Thought content normal.        Judgment: Judgment normal.     Ortho Exam Left middle finger exam shows mild swelling and some loss of the creases at the IP joints.  Her collateral ligaments are stable.  She has hesitation with finger flexion but when distracted she is not able to flex her IP joints.  She is able to fully extend her finger without any difficulty.  No signs of infection.  Normal cascade and tenodesis effect Right hand exam shows a healed puncture wound in the fourth webspace without any evidence of infection. Specialty Comments:  No specialty comments available.  Imaging: No results found.   PMFS History: Patient Active Problem List   Diagnosis Date Noted  . Pain of right hand 01/04/2019  . Pain of left middle finger 12/19/2018  . Abdominal pain 08/10/2018  . Right ankle pain 06/16/2018  . Low back pain 06/16/2018  . Left leg pain 08/09/2015  . Hx of  adenomatous colonic polyps 01/26/2015  . 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 Vertigo 08/17/2014  . Hidradenitis 04/05/2013  . Hyperlipidemia 01/24/2012  . Depression 01/22/2012  . Encounter for preventative adult health care exam with abnormal findings 01/15/2012  . Vitamin D deficiency 07/09/2010   Past Medical History:  Diagnosis Date  . Depression 01/22/2012  . Hx of adenomatous colonic polyps 01/26/2015  . Hyperlipidemia 01/24/2012  . VITAMIN D DEFICIENCY 07/09/2010   Qualifier: Diagnosis of  By: Jenny Reichmann MD, Hunt Oris     Family History  Problem Relation Age of Onset  . Heart disease Mother   . Heart disease Brother   . Colon cancer Neg Hx     Past Surgical History:  Procedure Laterality Date  . ceaserian section     1 time   Social History   Occupational History  . Not on file  Tobacco Use  . Smoking status: Never Smoker  . Smokeless tobacco: Never Used  Substance and Sexual Activity  . Alcohol use: No    Alcohol/week: 0.0 standard drinks  . Drug use: No  . Sexual activity: Yes    Birth control/protection: Post-menopausal

## 2019-01-19 DIAGNOSIS — M25542 Pain in joints of left hand: Secondary | ICD-10-CM | POA: Diagnosis not present

## 2019-01-19 DIAGNOSIS — M6281 Muscle weakness (generalized): Secondary | ICD-10-CM | POA: Diagnosis not present

## 2019-01-19 DIAGNOSIS — M25642 Stiffness of left hand, not elsewhere classified: Secondary | ICD-10-CM | POA: Diagnosis not present

## 2019-01-19 DIAGNOSIS — S63613S Unspecified sprain of left middle finger, sequela: Secondary | ICD-10-CM | POA: Diagnosis not present

## 2019-01-24 ENCOUNTER — Ambulatory Visit: Payer: Medicare Other

## 2019-02-01 ENCOUNTER — Ambulatory Visit (INDEPENDENT_AMBULATORY_CARE_PROVIDER_SITE_OTHER): Payer: Medicare Other | Admitting: Orthopaedic Surgery

## 2019-02-02 ENCOUNTER — Ambulatory Visit: Payer: Self-pay | Admitting: Orthopaedic Surgery

## 2019-02-10 ENCOUNTER — Telehealth: Payer: Self-pay | Admitting: Internal Medicine

## 2019-02-10 NOTE — Telephone Encounter (Signed)
Copied from [place] [id number]. Topic: General - Other >> Feb 10, 2019 12:11 PM Keene Breath wrote: Reason for CRM: Patient called to request that the doctor fill out some forms for the patient.  Patient stated that she has the forms and would like to know how she should get them to the doctor.  Please have the assistant call the patient to let her know.  CB# [phone]

## 2019-02-10 NOTE — Telephone Encounter (Signed)
Spoke to patient and she is dropping off the forms today, the forms are for [facility]. She is Nx for symptoms, she would like the forms mailed back to her when they are completed.

## 2019-03-13 ENCOUNTER — Other Ambulatory Visit: Payer: Self-pay | Admitting: Internal Medicine

## 2019-03-13 MED ORDER — ALENDRONATE SODIUM 70 MG PO TABS: 70.0000 mg | ORAL_TABLET | ORAL | 1 refills | Status: DC

## 2019-03-13 NOTE — Telephone Encounter (Signed)
Copied from [place] [id number]. Topic: Quick Communication - Rx Refill/Question >> Mar 13, 2019 10:19 AM Lionel December wrote: Medication:  alendronate (FOSAMAX) 70 MG tablet   Has the patient contacted their pharmacy? Yes.   (Agent: If no, request that the patient contact the pharmacy for the refill.) (Agent: If yes, when and what did the pharmacy advise?)  Preferred Pharmacy (with phone number or street name): [id number] - [place], [place] [phone] (Phone) [phone] (Fax)    Agent: Please be advised that RX refills may take up to 3 business days. We ask that you follow-up with your pharmacy.

## 2019-03-13 NOTE — Telephone Encounter (Signed)
Reviewed chart pt is up-to-date sent refills to [facility].../lmb  

## 2019-03-13 NOTE — Telephone Encounter (Signed)
Copied from [place] [id number]. Topic: Quick Communication - Rx Refill/Question >> Mar 13, 2019 10:19 AM Lionel December wrote: Medication:  alendronate (FOSAMAX) 70 MG tablet   Has the patient contacted their pharmacy? Yes.   (Agent: If no, request that the patient contact the pharmacy for the refill.) (Agent: If yes, when and what did the pharmacy advise?)  Preferred Pharmacy (with phone number or street name): [id number] - [place], [place] [phone] (Phone) [phone] (Fax)    Agent: Please be advised that RX refills may take up to 3 business days. We ask that you follow-up with your pharmacy.

## 2019-03-14 ENCOUNTER — Ambulatory Visit: Payer: Medicare Other

## 2019-03-16 NOTE — Telephone Encounter (Signed)
Forms have been signed, Faxed to [employer], Copy sent to scan.   Original has been mailed to patient.

## 2019-03-16 NOTE — Telephone Encounter (Signed)
I received [employer] forms via mail.   Forms have been completed & placed in providers box to review and sign.

## 2019-04-10 ENCOUNTER — Ambulatory Visit: Payer: Self-pay | Admitting: Internal Medicine

## 2019-04-10 NOTE — Telephone Encounter (Signed)
Pt states fell from truck Saturday. Reports dizziness, "Sleepiness" since fall; "Can't hardly stay awake." States may have had LOC. Also reports fell a second time Saturday and once this AM. States room spinning at times, headache, States hit middle, back of head. No lacerations, "Huge lump there." Denies any other injuries. States additional falls are from the dizziness. Pt directed to ED; states will follow disposition,  friend will drive.  Reason for Disposition . [1] Knocked out (unconscious) < 1 minute AND [2] now fine  Answer Assessment - Initial Assessment Questions 1. MECHANISM: "How did the injury happen?" For falls, ask: "What height did you fall from?" and "What surface did you fall against?"      Fell from truck 2. ONSET: "When did the injury happen?" (Minutes or hours ago)      Saturday 3. NEUROLOGIC SYMPTOMS: "Was there any loss of consciousness?" "Are there any other neurological symptoms?"      "Maybe" 4. MENTAL STATUS: "Does the person know who he is, who you are, and where he is?"      yes 5. LOCATION: "What part of the head was hit?"      Back of head, middle 6. SCALP APPEARANCE: "What does the scalp look like? Is it bleeding now?" If so, ask: "Is it difficult to stop?"      No laceration 7. SIZE: For cuts, bruises, or swelling, ask: "How large is it?" (e.g., inches or centimeters)     none 8. PAIN: "Is there any pain?" If so, ask: "How bad is it?"  (e.g., Scale 1-10; or mild, moderate, severe)    varies 9. TETANUS: For any breaks in the skin, ask: "When was the last tetanus booster?"     no 10. OTHER SYMPTOMS: "Do you have any other symptoms?" (e.g., neck pain, vomiting)       Headache, intermittent, advil helps; sleepiness, "Can hardly stay awake." "Feel out of it."  Room spins  Protocols used: HEAD INJURY-A-AH

## 2019-04-11 NOTE — Telephone Encounter (Signed)
Noted  

## 2019-04-12 ENCOUNTER — Emergency Department (HOSPITAL_COMMUNITY)
Admission: EM | Admit: 2019-04-12 | Discharge: 2019-04-12 | Disposition: A | Discharge status: Home / Self Care | Payer: Medicare Other | Attending: Emergency Medicine | Admitting: Emergency Medicine

## 2019-04-12 ENCOUNTER — Telehealth: Payer: Self-pay

## 2019-04-12 ENCOUNTER — Emergency Department (HOSPITAL_COMMUNITY): Payer: Medicare Other

## 2019-04-12 ENCOUNTER — Other Ambulatory Visit: Payer: Self-pay

## 2019-04-12 DIAGNOSIS — W010XXA Fall on same level from slipping, tripping and stumbling without subsequent striking against object, initial encounter: Secondary | ICD-10-CM | POA: Diagnosis not present

## 2019-04-12 DIAGNOSIS — S060X1A Concussion with loss of consciousness of 30 minutes or less, initial encounter: Secondary | ICD-10-CM | POA: Diagnosis not present

## 2019-04-12 DIAGNOSIS — M25571 Pain in right ankle and joints of right foot: Secondary | ICD-10-CM | POA: Insufficient documentation

## 2019-04-12 DIAGNOSIS — S0990XA Unspecified injury of head, initial encounter: Secondary | ICD-10-CM | POA: Diagnosis present

## 2019-04-12 DIAGNOSIS — Y9389 Activity, other specified: Secondary | ICD-10-CM | POA: Diagnosis not present

## 2019-04-12 DIAGNOSIS — Y92010 Kitchen of single-family (private) house as the place of occurrence of the external cause: Secondary | ICD-10-CM | POA: Diagnosis not present

## 2019-04-12 DIAGNOSIS — Y999 Unspecified external cause status: Secondary | ICD-10-CM | POA: Diagnosis not present

## 2019-04-12 DIAGNOSIS — R51 Headache: Secondary | ICD-10-CM | POA: Diagnosis not present

## 2019-04-12 LAB — COMPREHENSIVE METABOLIC PANEL
ALT: 13 U/L (ref 0–44)
AST: 18 U/L (ref 15–41)
Albumin: 3.9 g/dL (ref 3.5–5.0)
Alkaline Phosphatase: 82 U/L (ref 38–126)
Anion gap: 11 (ref 5–15)
BUN: 11 mg/dL (ref 8–23)
CO2: 22 mmol/L (ref 22–32)
Calcium: 9.5 mg/dL (ref 8.9–10.3)
Chloride: 109 mmol/L (ref 98–111)
Creatinine, Ser: 0.83 mg/dL (ref 0.44–1.00)
GFR calc Af Amer: >60 mL/min (ref >60)
GFR calc non Af Amer: >60 mL/min (ref >60)
Glucose, Bld: 90 mg/dL (ref 70–99)
Potassium: 4.2 mmol/L (ref 3.5–5.1)
Sodium: 142 mmol/L (ref 135–145)
Total Bilirubin: 0.4 mg/dL (ref 0.3–1.2)
Total Protein: 7.2 g/dL (ref 6.5–8.1)

## 2019-04-12 LAB — CBC WITH DIFFERENTIAL/PLATELET
Abs Immature Granulocytes: 0.02 10*3/uL (ref 0.00–0.07)
Basophils Absolute: 0 10*3/uL (ref 0.0–0.1)
Basophils Relative: 1 %
Eosinophils Absolute: 0.2 10*3/uL (ref 0.0–0.5)
Eosinophils Relative: 3 %
HCT: 43.8 % (ref 36.0–46.0)
Hemoglobin: 13.8 g/dL (ref 12.0–15.0)
Immature Granulocytes: 0 %
Lymphocytes Relative: 33 %
Lymphs Abs: 2.2 10*3/uL (ref 0.7–4.0)
MCH: 30.7 pg (ref 26.0–34.0)
MCHC: 31.5 g/dL (ref 30.0–36.0)
MCV: 97.3 fL (ref 80.0–100.0)
Monocytes Absolute: 0.5 10*3/uL (ref 0.1–1.0)
Monocytes Relative: 7 %
Neutro Abs: 3.8 10*3/uL (ref 1.7–7.7)
Neutrophils Relative %: 56 %
Platelets: 292 10*3/uL (ref 150–400)
RBC: 4.5 MIL/uL (ref 3.87–5.11)
RDW: 13.5 % (ref 11.5–15.5)
WBC: 6.7 10*3/uL (ref 4.0–10.5)
nRBC: 0 % (ref 0.0–0.2)

## 2019-04-12 IMAGING — DX RIGHT ANKLE - COMPLETE 3+ VIEW
3 series · 3 of 3 positions shown · non-contrast
Comparison: [DATE]

CLINICAL DATA: Status post fall yesterday.  Ankle pain.

EXAM:
RIGHT ANKLE - COMPLETE 3+ VIEW

[ankle ap]
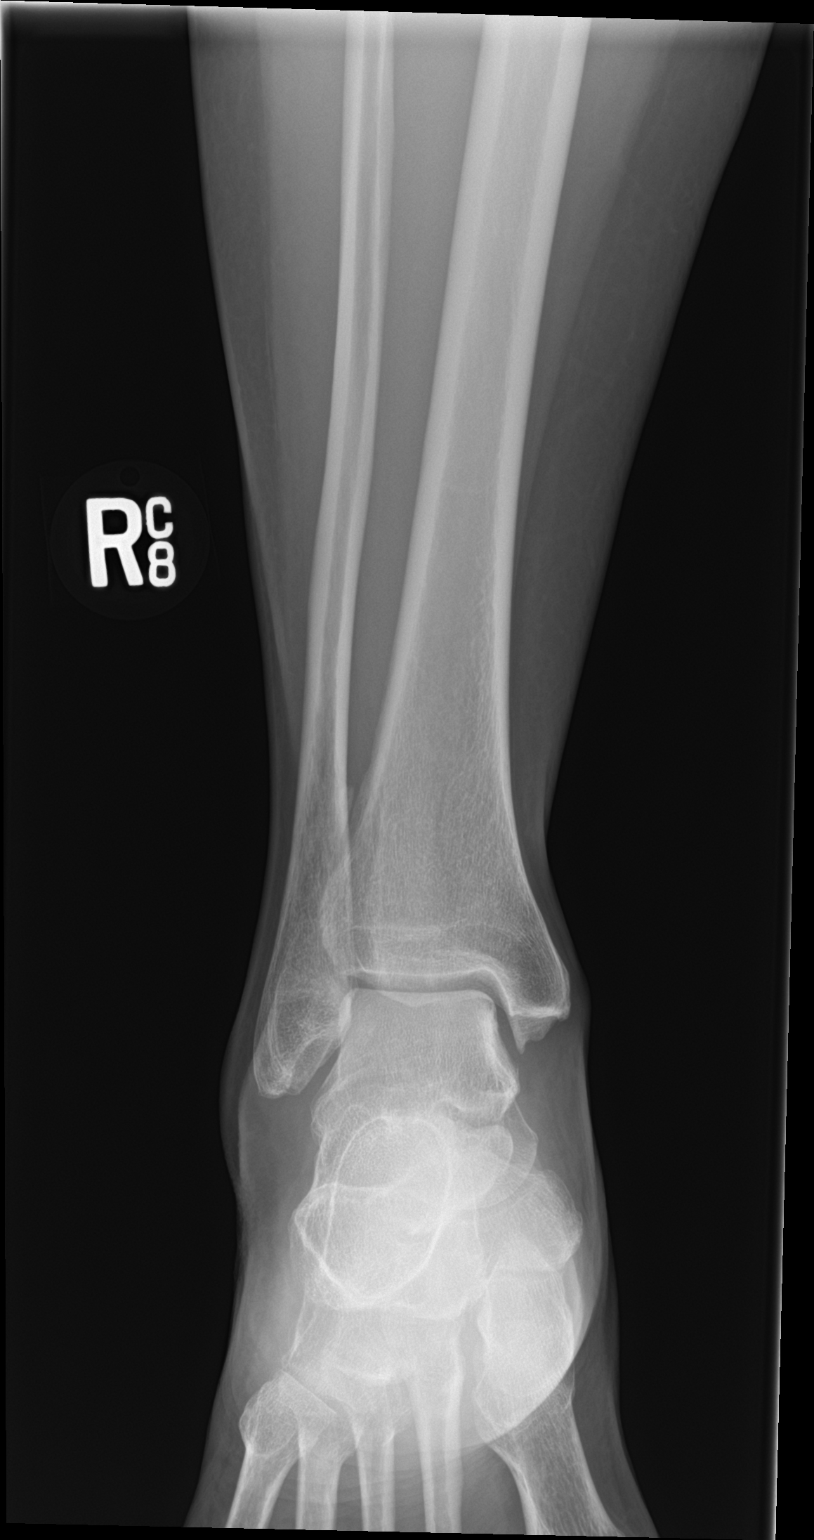

[ankle obl]
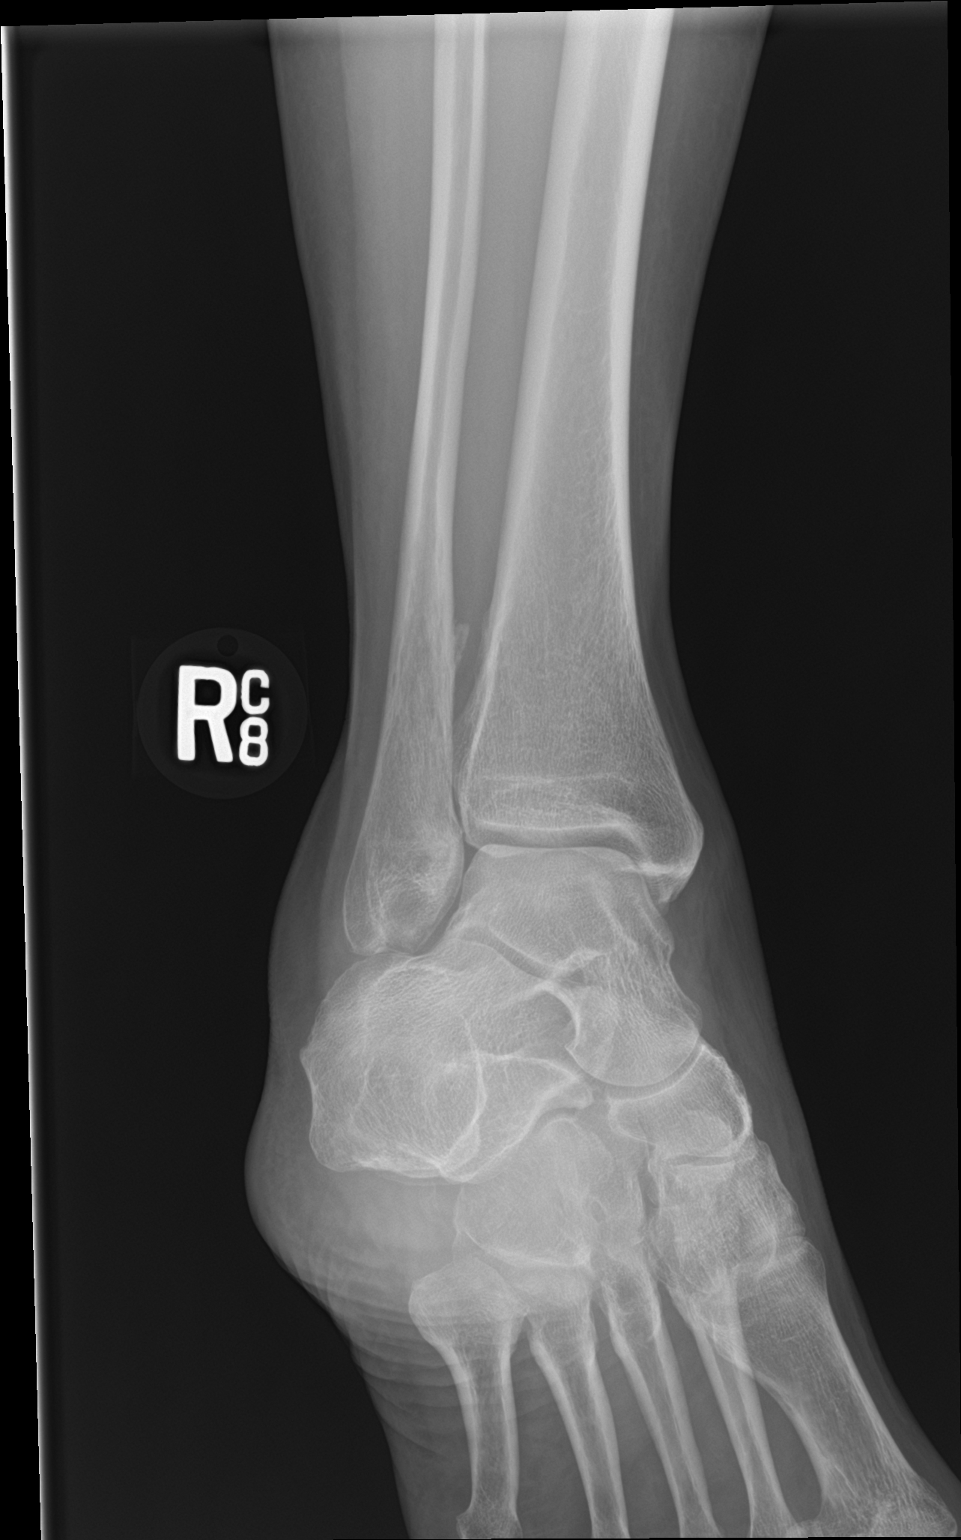

[ankle lat]
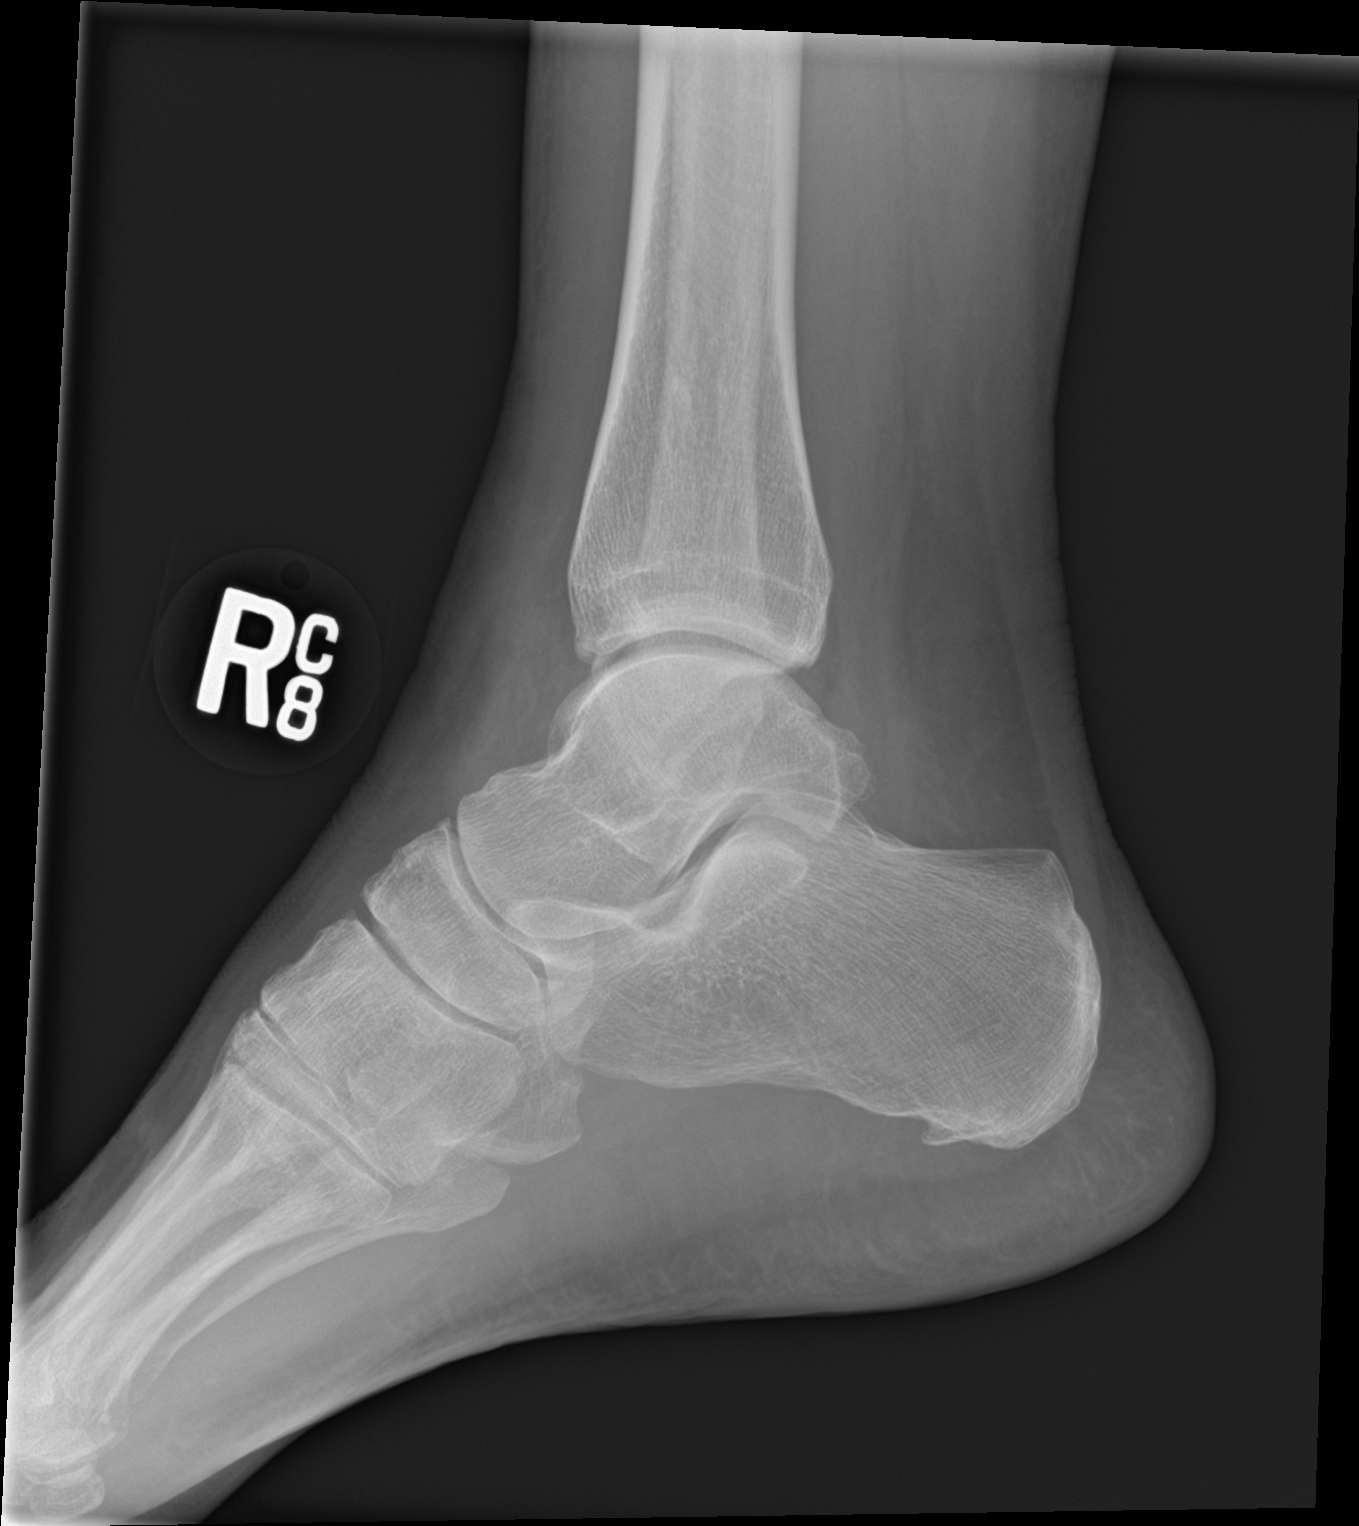

[3 of 3 positions shown; findings below may reference images not displayed]

FINDINGS: There is no evidence of fracture, dislocation, or joint effusion.
There is a small plantar calcaneal spur. Soft tissues are
unremarkable.
IMPRESSION: No acute osseous injury of the right ankle.

## 2019-04-12 IMAGING — CT CT HEAD WITHOUT CONTRAST
4 series · 16 of 47 positions shown, 18 images · non-contrast
Comparison: [DATE]

CLINICAL DATA: Recent fall with headaches, initial encounter

EXAM:
CT HEAD WITHOUT CONTRAST
TECHNIQUE: Contiguous axial images were obtained from the base of the skull
through the vertex without intravenous contrast.

[Series 3: head wo · axial · 0.39mm/px · z∈[-95,+25]mm · 7 of 34 slices shown, 9 images]
[im 5/34  brain]
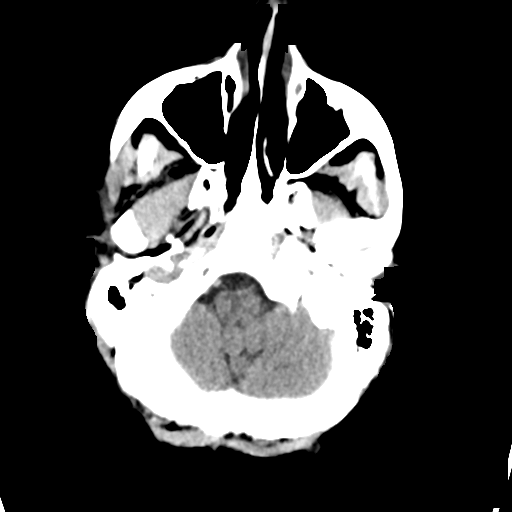
[im 5/34  bone]
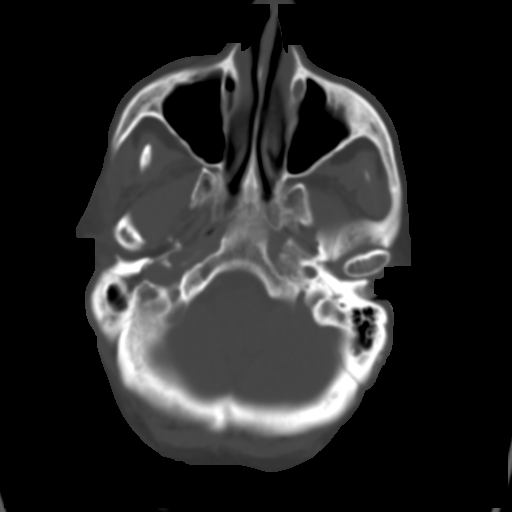
[im 9/34  brain]
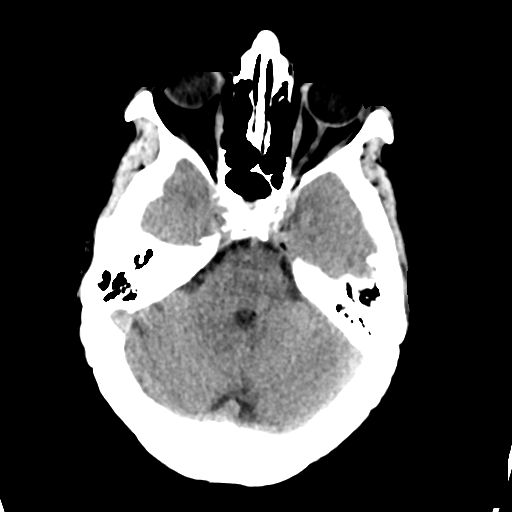
[im 13/34  brain]
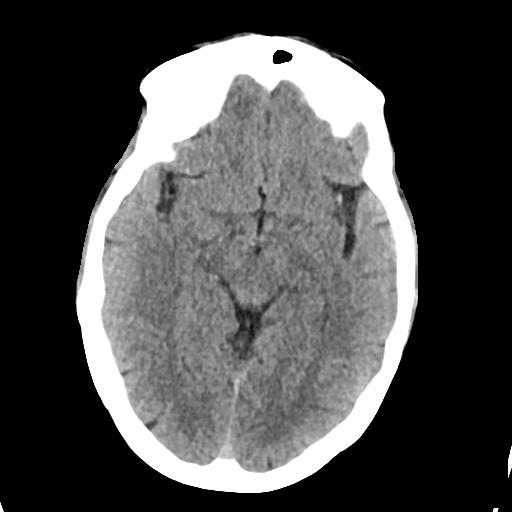
[im 17/34  brain]
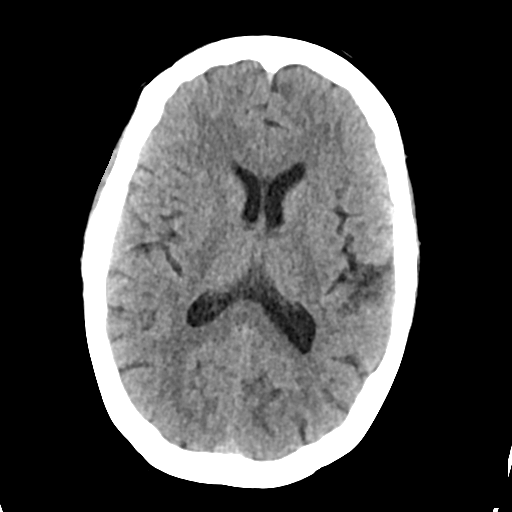
[im 21/34  brain]
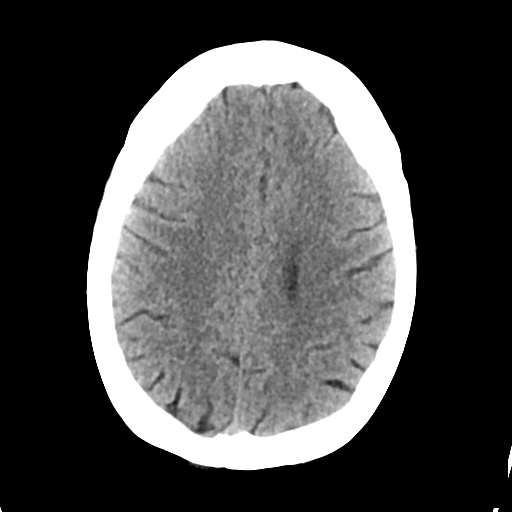
[im 21/34  bone]
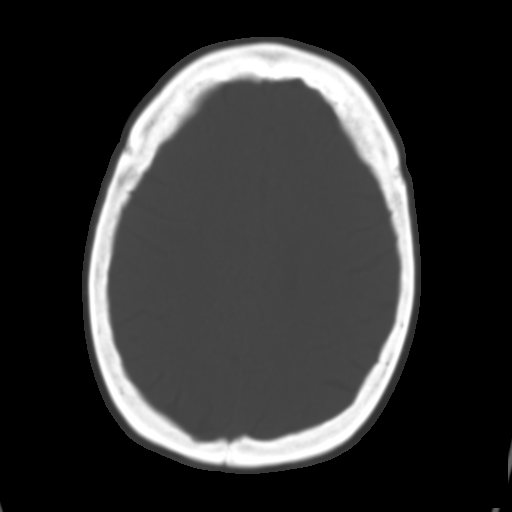
[im 25/34  brain]
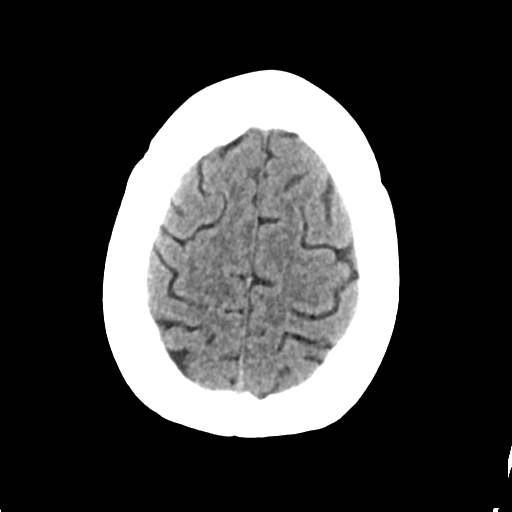
[im 29/34  brain]
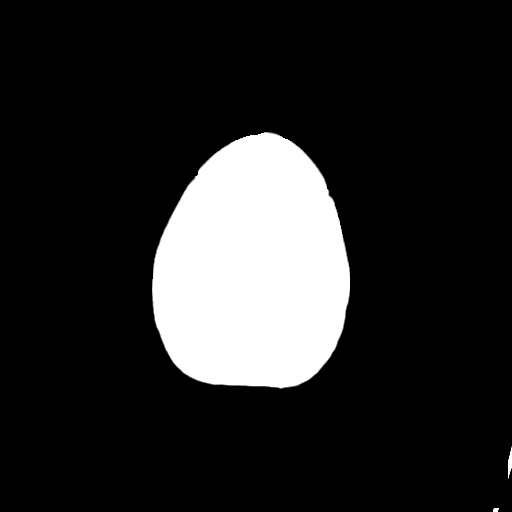

[Series 4: head bone · axial · 0.39mm/px · z∈[-99,-67]mm · 3 of 83 slices shown]
[im 9/83  bone]
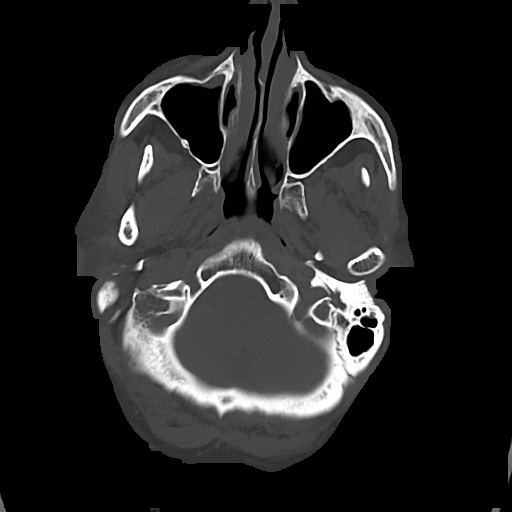
[im 17/83  bone]
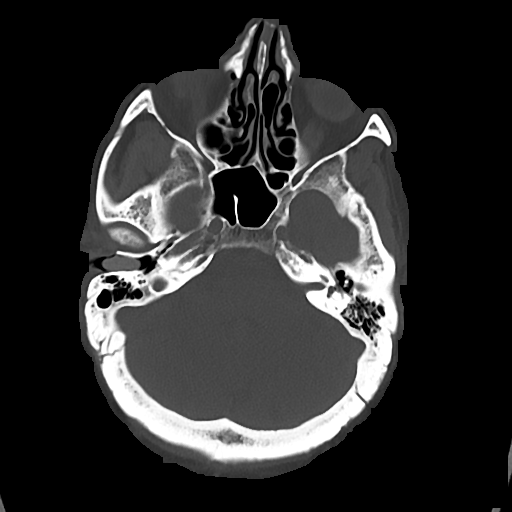
[im 25/83  bone]
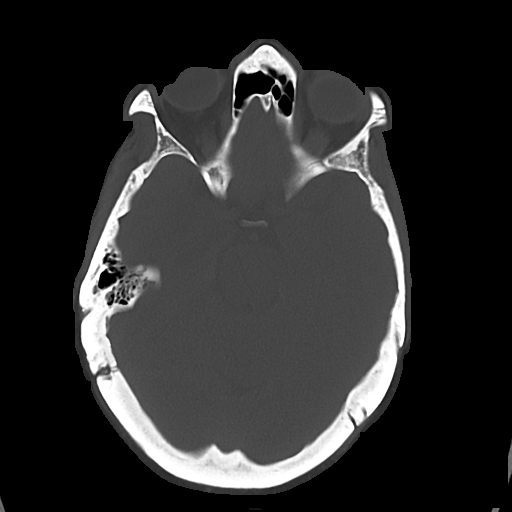

[Series 5: cor soft · coronal · 0.31mm/px · 3 of 68 slices shown]
[im 23/68  brain]
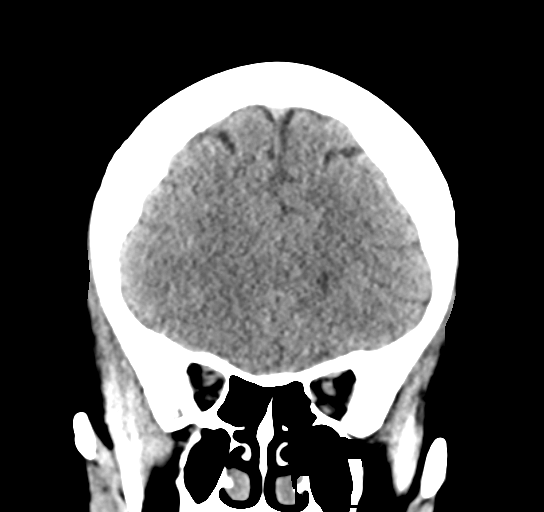
[im 30/68  brain]
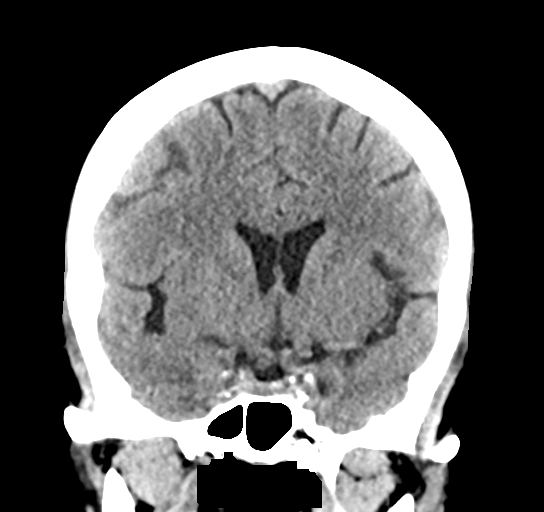
[im 38/68  brain]
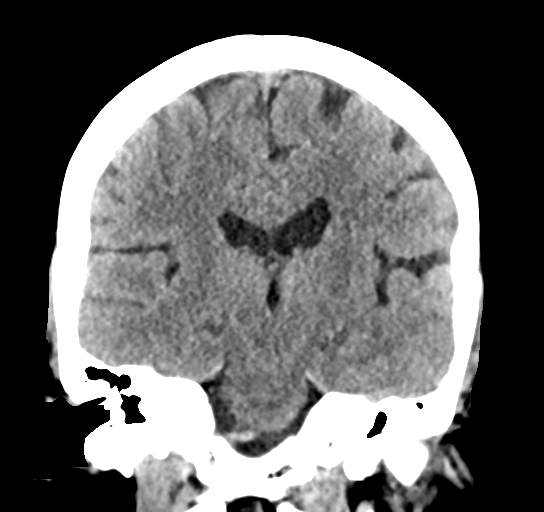

[Series 6: sag soft · sagittal · 0.33mm/px · 3 of 46 slices shown]
[im 16/46  brain]
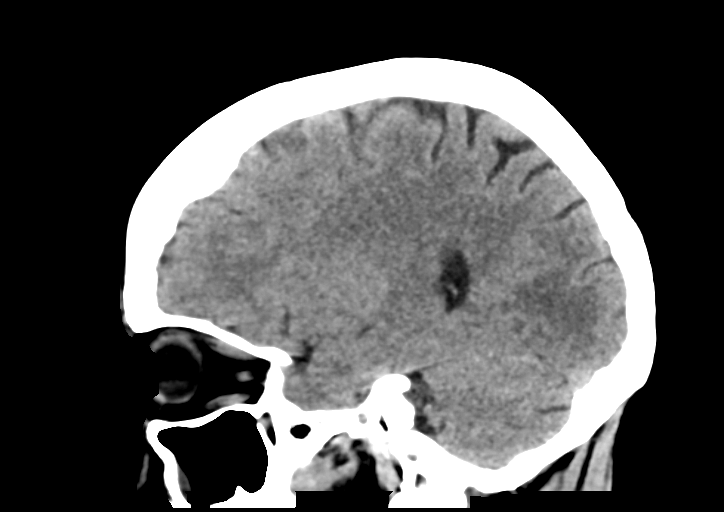
[im 23/46  brain]
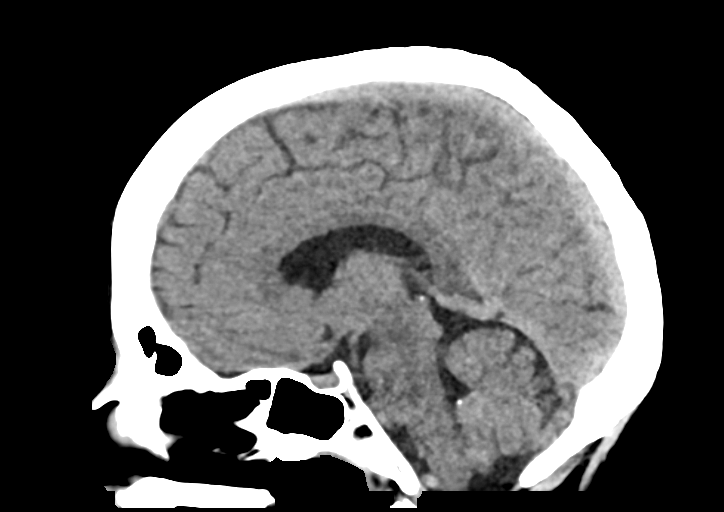
[im 31/46  brain]
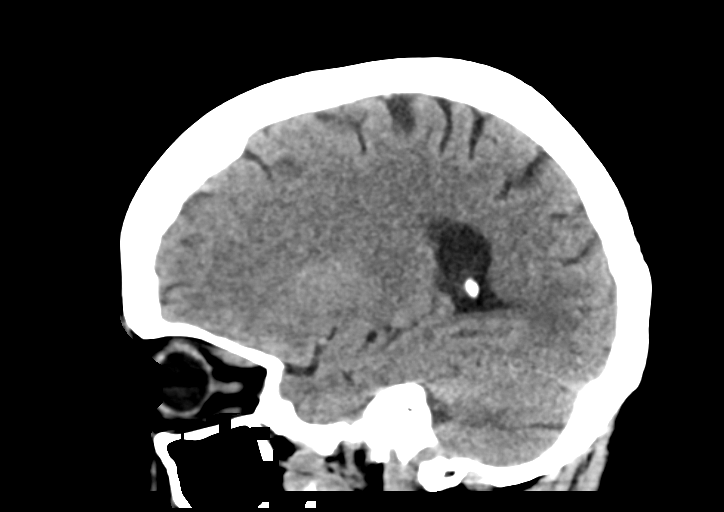

[16 of 47 positions shown; findings below may reference images not displayed]

FINDINGS: Brain: No evidence of acute infarction, hemorrhage, hydrocephalus,
extra-axial collection or mass lesion/mass effect.

Vascular: No hyperdense vessel or unexpected calcification.

Skull: Normal. Negative for fracture or focal lesion.

Sinuses/Orbits: No acute finding.

Other: None.
IMPRESSION: Normal head CT.

## 2019-04-12 NOTE — Discharge Instructions (Addendum)
The x-ray of your ankle is normal. Apply ice to your ankle for 20 min at a time.  Please read instructions below. You can treat your headache with over-the-counter medications such as tylenol as needed. Stay hydrated and get plenty of rest. Limit your screen time and complex thinking. Avoid any contact sports/activities to prevent re-injury to your head. Follow up with your primary care provider in 1 week for re-check and to be cleared to return to normal activity. Return to the ER if you develop severely worsening headache, changes in your vision, persistent vomiting, or new or concerning symptoms.

## 2019-04-12 NOTE — ED Provider Notes (Signed)
[facility] EM[facility]ENCY DEPARTMENT Provider Note   CSN: [id number] Arrival date & time: 04/12/19  9323    History   Chief Complaint Chief Complaint  Patient presents with   Head Injury    HPI POPPY MCAFEE is a 68 y.o. female w PMHx of HLD, presenting to the ED with complaint of head injury that occurred on Friday.  Patient states she was pushing a refrigerator to move it and she lost her footing and fell backwards hitting the right top of her head on the ground.  She states she had a few seconds of LOC.  She has had mild intermittent headache since then and has felt more clumsy with multiple mechanical falls.  She has not reinjured her head with any of the subsequent falls.  She does report some right lateral ankle pain.  She states she has been treating her symptoms with ibuprofen and it provides relief of her headache.  States sometimes she sees some spots, however is not having any loss or double vision.  Denies numbness/tingling, nausea, vomiting, neck pain, or other injuries or complaints.  She is not on anticoagulation.  She attempted to see her PCP, however they recommended she be evaluated in the ED first.     The history is provided by the patient.    Past Medical History:  Diagnosis Date   Depression 01/22/2012   Hx of adenomatous colonic polyps 01/26/2015   Hyperlipidemia 01/24/2012   VITAMIN D DEFICIENCY 07/09/2010   Qualifier: Diagnosis of  By: Jenny Reichmann MD, Hunt Oris     Patient Active Problem List   Diagnosis Date Noted   Pain of right hand 01/04/2019   Pain of left middle finger 12/19/2018   Abdominal pain 08/10/2018   Right ankle pain 06/16/2018   Low back pain 06/16/2018   Left leg pain 08/09/2015   Hx of adenomatous colonic polyps 01/26/2015   Headaches due to old head injury 09/04/2014   Vertigo 08/17/2014   Hidradenitis 04/05/2013   Hyperlipidemia 01/24/2012   Depression 01/22/2012   Encounter for preventative adult health  care exam with abnormal findings 01/15/2012   Vitamin D deficiency 07/09/2010    Past Surgical History:  Procedure Laterality Date   ceaserian section     1 time     OB History   No obstetric history on file.      Home Medications    Prior to Admission medications   Medication Sig Start Date End Date Taking? Authorizing Provider  acetaminophen (TYLENOL) 325 MG tablet Take 650 mg by mouth every 6 (six) hours as needed for mild pain.    [provider]  AFLURIA PRESERVATIVE FREE 0.5 ML SUSY Inject 1 Dose as directed once. 06/25/14   [provider]  alendronate (FOSAMAX) 70 MG tablet Take 1 tablet (70 mg total) by mouth every 7 (seven) days. Take with a full glass of water on an empty stomach. 03/13/19   Biagio Borg, MD  HYDROcodone-acetaminophen (NORCO) 7.5-325 MG tablet Take 1 tablet by mouth every 6 (six) hours as needed for moderate pain. 12/19/18   Biagio Borg, MD  lovastatin (MEVACOR) 40 MG tablet Take 1 tablet (40 mg total) by mouth at bedtime. 06/16/18   Biagio Borg, MD  Multiple Vitamin (MULTIVITAMIN) tablet Take 1 tablet by mouth daily.    [provider]  SUMAtriptan (IMITREX) 100 MG tablet Take 1 tablet (100 mg total) by mouth every 2 (two) hours as needed for migraine or  headache. May repeat in 2 hours if headache persists or recurs. 06/16/18   Biagio Borg, MD    Family History Family History  Problem Relation Age of Onset   Heart disease Mother    Heart disease Brother    Colon cancer Neg Hx     Social History Social History   Tobacco Use   Smoking status: Never Smoker   Smokeless tobacco: Never Used  Substance Use Topics   Alcohol use: No    Alcohol/week: 0.0 standard drinks   Drug use: No     Allergies   Doxycycline   Review of Systems Review of Systems  All other systems reviewed and are negative.    Physical Exam Updated Vital Signs BP 128/80 (BP Location: Right Arm)    Pulse 71    Temp 97.8 F (36.6  C) (Oral)    Resp 16    SpO2 98%   Physical Exam Vitals signs and nursing note reviewed.  Constitutional:      General: She is not in acute distress.    Appearance: She is well-developed. She is not ill-appearing.  HENT:     Head: Normocephalic and atraumatic.     Comments: There is some tenderness to the right upper occipital scalp, no hematoma, crepitus, bruising, or lacerations.  No battle sign.  No hemotympanum. Eyes:     Extraocular Movements: Extraocular movements intact.     Conjunctiva/sclera: Conjunctivae normal.     Pupils: Pupils are equal, round, and reactive to light.  Neck:     Musculoskeletal: Normal range of motion and neck supple. No muscular tenderness.  Cardiovascular:     Rate and Rhythm: Normal rate and regular rhythm.  Pulmonary:     Effort: Pulmonary effort is normal. No respiratory distress.     Breath sounds: Normal breath sounds.  Abdominal:     General: Bowel sounds are normal.     Palpations: Abdomen is soft.  Musculoskeletal:     Comments: Right lateral ankle with tenderness just posterior to the lateral malleolus.  There is no deformity, swelling, or bruising.  There is some pain with range of motion.  Skin:    General: Skin is warm.  Neurological:     General: No focal deficit present.     Mental Status: She is alert.     Comments: Mental Status:  Alert, oriented, thought content appropriate, able to give a coherent history. Speech fluent without evidence of aphasia. Able to follow 2 step commands without difficulty.  Cranial Nerves:  II:  Peripheral visual fields grossly normal, pupils equal, round, reactive to light III,IV, VI: ptosis not present, extra-ocular motions intact bilaterally  V,VII: smile symmetric, facial light touch sensation equal VIII: hearing grossly normal to voice  X: uvula elevates symmetrically  XI: bilateral shoulder shrug symmetric and strong XII: midline tongue extension without fassiculations Motor:  Normal tone. 5/5  in upper and lower extremities bilaterally including strong and equal grip strength and dorsiflexion/plantar flexion Sensory: grossly normal in all extremities.  Cerebellar: normal finger-to-nose with bilateral upper extremities Gait: normal gait and balance CV: distal pulses palpable throughout    Psychiatric:        Behavior: Behavior normal.      ED Treatments / Results  Labs (all labs ordered are listed, but only abnormal results are displayed) Labs Reviewed  CBC WITH DIFFERENTIAL/PLATELET  COMPREHENSIVE METABOLIC PANEL    EKG EKG Interpretation  Date/Time:  Wednesday April 12 2019 12:12:13 EDT Ventricular Rate:  70  PR Interval:    QRS Duration: 84 QT Interval:  383 QTC Calculation: 414 R Axis:   78 Text Interpretation:  Sinus rhythm Confirmed by Virgel Manifold [phone]) on 04/12/2019 12:57:15 PM   Radiology Dg Ankle Complete Right  Result Date: 04/12/2019 CLINICAL DATA:  Status post fall yesterday.  Ankle pain. EXAM: RIGHT ANKLE - COMPLETE 3+ VIEW COMPARISON:  12/19/2018 FINDINGS: There is no evidence of fracture, dislocation, or joint effusion. There is a small plantar calcaneal spur. Soft tissues are unremarkable. IMPRESSION: No acute osseous injury of the right ankle. Electronically Signed   By: Kathreen Devoid   On: 04/12/2019 12:27   Ct Head Wo Contrast  Result Date: 04/12/2019 CLINICAL DATA:  Recent fall with headaches, initial encounter EXAM: CT HEAD WITHOUT CONTRAST TECHNIQUE: Contiguous axial images were obtained from the base of the skull through the vertex without intravenous contrast. COMPARISON:  08/03/2014 FINDINGS: Brain: No evidence of acute infarction, hemorrhage, hydrocephalus, extra-axial collection or mass lesion/mass effect. Vascular: No hyperdense vessel or unexpected calcification. Skull: Normal. Negative for fracture or focal lesion. Sinuses/Orbits: No acute finding. Other: None. IMPRESSION: Normal head CT. Electronically Signed   By: Inez Catalina M.D.    On: 04/12/2019 12:05    Procedures Procedures (including critical care time)  Medications Ordered in ED Medications - No data to display   Initial Impression / Assessment and Plan / ED Course  I have reviewed the triage vital signs and the nursing notes.  Pertinent labs & imaging results that were available during my care of the patient were reviewed by me and considered in my medical decision making (see chart for details).      Patient presenting after mechanical fall with head trauma on Friday.  She has had intermittent headaches since then that are relieved by ibuprofen.  Not on anticoagulation. She has also had a few mechanical falls without head trauma or LOC since the initial fall, w some pain in her right ankle. On exam, no obvious trauma on evaluation.  She is well-appearing with normal neurologic exam.  CT scan of her head  is negative for acute fracture or bleed.  Imaging of her right ankle is negative for fracture.  Patient given concussion precautions and discussed symptomatic management.  Ace wrap for her ankle and R ICE therapy recommended.  Recommended close follow-up with her PCP and strict return precautions.  She is well-appearing, nondistressed, agreeable plan, safe discharge  Discussed results, findings, treatment and follow up. Patient advised of return precautions. Patient verbalized understanding and agreed with plan.  Final Clinical Impressions(s) / ED Diagnoses   Final diagnoses:  Concussion with loss of consciousness of 30 minutes or less, initial encounter  Acute right ankle pain    ED Discharge Orders    None       Ayaana Biondo, [place] N, PA-C 04/12/19 1336    Virgel Manifold, MD 04/14/19 1103

## 2019-04-12 NOTE — Telephone Encounter (Signed)
Spoke with patient. Hard to understand patient and express that I really cannot hear her well. Patient lost balance and fell in neighbor's kitchen. Has been having headaches. Went into ED today. CT (-). Unsure of LOC. Patient does not work. Recommended to seek medical attention prior to appointment on Friday for worsening symptoms. Otherwise recommend rest. Patient on schedule on Friday.

## 2019-04-12 NOTE — ED Triage Notes (Signed)
Pt was helping a friend move a fridge last Friday and fell hitting her head , since that time pt has been dizzy and her gait has been "off"

## 2019-04-14 ENCOUNTER — Ambulatory Visit (INDEPENDENT_AMBULATORY_CARE_PROVIDER_SITE_OTHER): Payer: Medicare Other | Admitting: Family Medicine

## 2019-04-14 ENCOUNTER — Encounter: Payer: Self-pay | Admitting: Family Medicine

## 2019-04-14 DIAGNOSIS — G44309 Post-traumatic headache, unspecified, not intractable: Secondary | ICD-10-CM

## 2019-04-14 DIAGNOSIS — S0990XS Unspecified injury of head, sequela: Secondary | ICD-10-CM

## 2019-04-14 DIAGNOSIS — M25571 Pain in right ankle and joints of right foot: Secondary | ICD-10-CM

## 2019-04-14 MED ORDER — IBUPROFEN 600 MG PO TABS: 600.0000 mg | ORAL_TABLET | Freq: Three times a day (TID) | ORAL | 0 refills | Status: DC | PRN

## 2019-04-14 MED ORDER — DICLOFENAC SODIUM 1 % TD GEL: 2.0000 g | Freq: Two times a day (BID) | TRANSDERMAL | 11 refills | Status: DC | PRN

## 2019-04-14 NOTE — Assessment & Plan Note (Signed)
Patient has had headaches.  Seem to be chronic.  Patient states that they are no worse just little more frequent since her fall.  Has responded to ibuprofen previously.  Warned of potential side effects of taking high doses of ibuprofen.  Patient will continue with this and was sent in a prescription.  Patient will help her ankle.  We discussed if ankle pain not completely resolved see her again in 2 weeks

## 2019-04-14 NOTE — Progress Notes (Signed)
Virtual Visit via Video Note  I connected with Amber Hicks on 04/14/19 at 10:15 AM EDT by a video enabled telemedicine application and verified that I am speaking with the correct person using two identifiers.  Location: Patient: Home setting Provider: Office setting   I discussed the limitations of evaluation and management by telemedicine and the availability of in person appointments. The patient expressed understanding and agreed to proceed.  History of Present Illness: Patient 2 days ago was seen in the emergency room after a fall.  Had ankle pain and potential head injury.  Patient did have a CT of the head that was independently visualized by me showing no significant relief.  Patient states having headaches but states that it feels like her normal headaches.  Nothing worse.  Denies any visual changes, any photophobia, any abdominal pain over the ordinary.  Patient states that she has a lot of chronic problems and states that the ankle pain seems to be the worst.  Patient is concerned because she is alone.  Patient does not know how she would be able to get any other medications.    Observations/Objective: Alert and oriented.  Unable to see on virtual secondary to patient's type of phone.   Assessment and Plan: Head injury but does not appear to have any signs of a concussion likely.  Warned of potential concussion symptoms and when to seek medical attention.  We discussed icing regimen and home exercises.  Discussed which activities to do which wants to avoid.  Patient will increase activity slowly.  Discussed with the ankle pain the patient can be seen again sooner if necessary.       I discussed the assessment and treatment plan with the patient. The patient was provided an opportunity to ask questions and all were answered. The patient agreed with the plan and demonstrated an understanding of the instructions.   The patient was advised to call back or seek an in-person  evaluation if the symptoms worsen or if the condition fails to improve as anticipated.  I provided 25 minutes of non-face-to-face time during this encounter.   Lyndal Pulley, DO

## 2019-04-14 NOTE — Assessment & Plan Note (Signed)
X-rays were independently visualized by me from the emergency room no cortical defect and patient is able to bear weight.  Continues to walk with a walker.  We discussed we can see her in the office at a later date if she wants.  Patient will follow up with me again in 2 weeks if necessary.

## 2019-05-31 ENCOUNTER — Telehealth: Payer: Self-pay

## 2019-05-31 NOTE — Telephone Encounter (Signed)
I did not reach out to patient.   Copied from [place] [id number]. Topic: General - Other >> May 30, 2019  4:09 PM Amber Hicks wrote: Reason for CRM: pt called in stating that she is returning the office call .

## 2019-10-04 ENCOUNTER — Other Ambulatory Visit: Payer: Self-pay | Admitting: Internal Medicine

## 2019-10-04 MED ORDER — ALENDRONATE SODIUM 70 MG PO TABS: 70.0000 mg | ORAL_TABLET | ORAL | 1 refills | Status: DC

## 2019-10-04 NOTE — Telephone Encounter (Signed)
Medication Refill - Medication:  alendronate (FOSAMAX) 70 MG tablet   Has the patient contacted their pharmacy? Yes.   Pt states she only has one more pill left. Please advise.  (Agent: If no, request that the patient contact the pharmacy for the refill.) (Agent: If yes, when and what did the pharmacy advise?)  Preferred Pharmacy (with phone number or street name):  [facility] [address], [address] [place]  [place] [address]  Phone: [phone] Fax: [phone]  Open 24 hours     Agent: Please be advised that RX refills may take up to 3 business days. We ask that you follow-up with your pharmacy.

## 2019-12-22 ENCOUNTER — Other Ambulatory Visit: Payer: Self-pay

## 2019-12-22 ENCOUNTER — Encounter: Payer: Self-pay | Admitting: Internal Medicine

## 2019-12-22 ENCOUNTER — Ambulatory Visit (INDEPENDENT_AMBULATORY_CARE_PROVIDER_SITE_OTHER): Payer: Medicare Other | Admitting: Internal Medicine

## 2019-12-22 VITALS — BP 138/82 | HR 84 | Temp 98.1°F | Ht 65.0 in | Wt 165.0 lb

## 2019-12-22 DIAGNOSIS — M7702 Medial epicondylitis, left elbow: Secondary | ICD-10-CM

## 2019-12-22 DIAGNOSIS — M25512 Pain in left shoulder: Secondary | ICD-10-CM

## 2019-12-22 DIAGNOSIS — M7712 Lateral epicondylitis, left elbow: Secondary | ICD-10-CM

## 2019-12-22 MED ORDER — TRAMADOL HCL 50 MG PO TABS: 50.0000 mg | ORAL_TABLET | Freq: Four times a day (QID) | ORAL | 0 refills | Status: DC | PRN

## 2019-12-22 MED ORDER — PREDNISONE 10 MG PO TABS: ORAL_TABLET | ORAL | 0 refills | Status: DC

## 2019-12-22 NOTE — Progress Notes (Signed)
Subjective:    Patient ID: Amber Hicks, female    DOB: 1950/11/27, 69 y.o.   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  Pt denies chest pain, increased sob or doe, wheezing, orthopnea, PND, increased LE swelling, palpitations, dizziness or syncope.   Pt denies polydipsia, polyuria, Past Medical History:  Diagnosis Date  . Depression 01/22/2012  . Hx of adenomatous colonic polyps 01/26/2015  . Hyperlipidemia 01/24/2012  . VITAMIN D DEFICIENCY 07/09/2010   Qualifier: Diagnosis of  By: Jenny Reichmann MD, Hunt Oris    Past Surgical History:  Procedure Laterality Date  . ceaserian section     1 time    reports that she has never smoked. She has never used smokeless tobacco. She reports that she does not drink alcohol or use drugs. family history includes Heart disease in her brother and mother. Allergies  Allergen Reactions  . Doxycycline Rash   Current Outpatient Medications on File Prior to Visit  Medication Sig Dispense Refill  . acetaminophen (TYLENOL) 325 MG tablet Take 650 mg by mouth every 6 (six) hours as needed for mild pain.    Marland Kitchen AFLURIA PRESERVATIVE FREE 0.5 ML SUSY Inject 1 Dose as directed once.  0  . alendronate (FOSAMAX) 70 MG tablet Take 1 tablet (70 mg total) by mouth every 7 (seven) days. Take with a full glass of water on an empty stomach. 12 tablet 1  . diclofenac sodium (VOLTAREN) 1 % GEL Apply 2 g topically 2 (two) times daily as needed. To affected joint. 100 g 11  . HYDROcodone-acetaminophen (NORCO) 7.5-325 MG tablet Take 1 tablet by mouth every 6 (six) hours as needed for moderate pain. 30 tablet 0  . ibuprofen (ADVIL) 600 MG tablet Take 1 tablet (600 mg total) by mouth every 8 (eight) hours as needed for headache. 30 tablet 0  . lovastatin (MEVACOR) 40 MG tablet Take 1 tablet (40 mg  total) by mouth at bedtime. 90 tablet 3  . Multiple Vitamin (MULTIVITAMIN) tablet Take 1 tablet by mouth daily.    . SUMAtriptan (IMITREX) 100 MG tablet Take 1 tablet (100 mg total) by mouth every 2 (two) hours as needed for migraine or headache. May repeat in 2 hours if headache persists or recurs. 10 tablet 5   No current facility-administered medications on file prior to visit.   Review of Systems All otherwise neg per pt     Objective:   Physical Exam BP 138/82   Pulse 84   Temp 98.1 F (36.7 C)   Ht 5\' 5"  (1.651 m)   Wt 165 lb (74.8 kg)   SpO2 98%   BMI 27.46 kg/m  VS noted,  Constitutional: Pt appears in NAD HENT: Head: NCAT.  Right Ear: External ear normal.  Left Ear: External ear normal.  Eyes: . Pupils are equal, round, and reactive to light. Conjunctivae and EOM are normal Nose: without d/c or deformity Neck: Neck supple. Gross normal ROM Cardiovascular: Normal rate and regular rhythm.   Pulmonary/Chest: Effort normal and breath sounds without rales or wheezing.  + tender left bicipital tendon insertion site, as well as medial and lateral epicondylar areas with mild swelling Neurological: Pt is alert. At baseline orientation, motor grossly intact Skin: Skin is warm. No rashes, other new lesions, no LE edema Psychiatric: Pt  behavior is normal without agitation  All otherwise neg per pt Lab Results  Component Value Date   WBC 6.7 04/12/2019   HGB 13.8 04/12/2019   HCT 43.8 04/12/2019   PLT 292 04/12/2019   GLUCOSE 90 04/12/2019   CHOL 243 (H) 06/16/2018   TRIG 167.0 (H) 06/16/2018   HDL 42.80 06/16/2018   LDLDIRECT 163.0 01/22/2012   LDLCALC 167 (H) 06/16/2018   ALT 13 04/12/2019   AST 18 04/12/2019   NA 142 04/12/2019   K 4.2 04/12/2019   CL 109 04/12/2019   CREATININE 0.83 04/12/2019   BUN 11 04/12/2019   CO2 22 04/12/2019   TSH 3.48 06/16/2018      Assessment & Plan:

## 2019-12-22 NOTE — Patient Instructions (Signed)
Please take all new medication as prescribed - the pain medication, and the prednisone for your THREE PROBLEMS:  1)  Left bicep tendonitis (at the left shoulder)  2)  Left elbow tendonitis - outside part of the elbow  3)  Left elbow tendonitis - inner elbow  Please see Sports Medicine on the first floor (you can just call for an appt) if you are not better next week  Please continue all other medications as before, and refills have been done if requested.  Please have the pharmacy call with any other refills you may need.  Please keep your appointments with your specialists as you may have planned

## 2019-12-23 ENCOUNTER — Encounter: Payer: Self-pay | Admitting: Internal Medicine

## 2019-12-23 DIAGNOSIS — M7712 Lateral epicondylitis, left elbow: Secondary | ICD-10-CM | POA: Insufficient documentation

## 2019-12-23 DIAGNOSIS — M25512 Pain in left shoulder: Secondary | ICD-10-CM | POA: Insufficient documentation

## 2019-12-23 DIAGNOSIS — M7702 Medial epicondylitis, left elbow: Secondary | ICD-10-CM | POA: Insufficient documentation

## 2019-12-23 NOTE — Assessment & Plan Note (Signed)
For pain control, and f/u sports medicine

## 2019-12-23 NOTE — Assessment & Plan Note (Addendum)
For pain control likely bicipital tendonitis, and f/u sports medicine  I spent 31 minutes in preparing to see the patient by review of recent labs, imaging and procedures, obtaining and reviewing separately obtained history, communicating with the patient and family or caregiver, ordering medications, tests or procedures, and documenting clinical information in the EHR including the differential Dx, treatment, and any further evaluation and other management of left shoulder pain, medial and lateral epicondylitis left elbow

## 2020-01-02 ENCOUNTER — Telehealth: Payer: Self-pay | Admitting: Family Medicine

## 2020-01-02 ENCOUNTER — Ambulatory Visit: Payer: Medicare Other | Admitting: Family Medicine

## 2020-01-02 ENCOUNTER — Telehealth: Payer: Self-pay | Admitting: Internal Medicine

## 2020-01-02 ENCOUNTER — Ambulatory Visit: Payer: Self-pay

## 2020-01-02 ENCOUNTER — Other Ambulatory Visit: Payer: Self-pay

## 2020-01-02 ENCOUNTER — Encounter: Payer: Self-pay | Admitting: Family Medicine

## 2020-01-02 VITALS — BP 148/90 | HR 82 | Ht 65.0 in | Wt 162.2 lb

## 2020-01-02 DIAGNOSIS — M7712 Lateral epicondylitis, left elbow: Secondary | ICD-10-CM | POA: Diagnosis not present

## 2020-01-02 DIAGNOSIS — M25522 Pain in left elbow: Secondary | ICD-10-CM | POA: Diagnosis not present

## 2020-01-02 DIAGNOSIS — L989 Disorder of the skin and subcutaneous tissue, unspecified: Secondary | ICD-10-CM

## 2020-01-02 DIAGNOSIS — M25532 Pain in left wrist: Secondary | ICD-10-CM

## 2020-01-02 DIAGNOSIS — M25512 Pain in left shoulder: Secondary | ICD-10-CM | POA: Diagnosis not present

## 2020-01-02 MED ORDER — PREDNISONE 50 MG PO TABS: 50.0000 mg | ORAL_TABLET | Freq: Every day | ORAL | 0 refills | Status: DC

## 2020-01-02 NOTE — Telephone Encounter (Signed)
Ok for derm referral - done

## 2020-01-02 NOTE — Telephone Encounter (Signed)
Pt seen by Sports Med today for elbow/shoulder. At check-out she seemed confused as she has a spot on her hand that she talked to Dr. Jenny Reichmann about and was expecting Sports Med or Dr. Jenny Reichmann to "laser" it off. Unsure if a dermatology referral was intended or what the plan was for the hand.

## 2020-01-02 NOTE — Progress Notes (Signed)
    Subjective:    CC: L shoulder and L elbow pain  I, Amber Hicks, LAT, ATC, am serving as scribe for Dr. Lynne Hicks.  HPI: Pt is a 69 y/o female presenting w/ c/o L shoulder and L elbow pain x 3 weeks w/ no known MOI.  She locates her pain to her L shoulder and radiates to her L elbow.  She describes her pain as sharp.  She is currently wearing a sling.  Radiating pain: Yes from L shoulder to the L elbow Mechanical symptoms:No Swelling: Some at the L medial elbow/forearm Neck pain: No Aggravating factors: Movement and pressure to the area Treatments tried: sling; Tramadol 50 mg; 6-day prednisone tapering course.  Pertinent review of Systems: No fevers or chills  Relevant historical information: No history left tennis elbow   Objective:    Vitals:   01/02/20 1448  BP: (!) 148/90  Pulse: 82  SpO2: 97%   General: Well Developed, well nourished, and in no acute distress.   MSK: Left shoulder normal-appearing normal shoulder motion nontender. Left elbow normal-appearing Tender palpation at lateral epicondyle.  Not particularly tender at anterior posterior or medial elbow. Normal elbow motion. Intact strength elbow motion.  Isolated wrist and finger extension resistance reproduces pain at lateral elbow.    Impression and Recommendations:    Assessment and Plan: 69 y.o. female with lateral epicondylitis.  Discussed options.  Plan for wrist brace and Voltaren gel as well as higher dose prednisone burst.  Patient very reluctant to consider injection at this time.  Check back in 1 week.  If not better at that time would consider injection.Marland Kitchen  PDMP not reviewed this encounter. Orders Placed This Encounter  Procedures  . [place] LIMITED JOINT SPACE STRUCTURES UP LEFT(NO LINKED CHARGES)    Order Specific Question:   Reason for Exam (SYMPTOM  OR DIAGNOSIS REQUIRED)    Answer:   L elbow pain    Order Specific Question:   Preferred imaging location?    Answer:   [facility]   Meds ordered this encounter  Medications  . predniSONE (DELTASONE) 50 MG tablet    Sig: Take 1 tablet (50 mg total) by mouth daily.    Dispense:  5 tablet    Refill:  0    Discussed warning signs or symptoms. Please see discharge instructions. Patient expresses understanding.   The above documentation has been reviewed and is accurate and complete Amber Hicks

## 2020-01-02 NOTE — Telephone Encounter (Signed)
Patient's husband called asking if you would be able to call him to discuss what was talked about in the visit.  She said that she told him that she can't remember what was said.   He can be reached at [phone].

## 2020-01-02 NOTE — Patient Instructions (Signed)
Thank you for coming in today. Take prednsone daily for 5 days.  Use the wrist brace.  Use wrap as needed.  Add voltaren gel up to 4x daily over the counter.  Recheck in 1 week.    Tennis Elbow  Tennis elbow (lateral epicondylitis) is inflammation of tendons in your outer forearm, near your elbow. Tendons are tissues that connect muscle to bone. When you have tennis elbow, inflammation affects the tendons that you use to bend your wrist and move your hand up. Inflammation occurs in the lower part of the upper arm bone (humerus), where the tendons connect to the bone (lateral epicondyle). Tennis elbow often affects people who play tennis, but anyone may get the condition from repeatedly extending the wrist or turning the forearm. What are the causes? This condition is usually caused by repeatedly extending the wrist, turning the forearm, and using the hands. It can result from sports or work that requires repetitive forearm movements. In some cases, it may be caused by a sudden injury. What increases the risk? You are more likely to develop tennis elbow if you play tennis or another racket sport. You also have a higher risk if you frequently use your hands for work. Besides people who play tennis, others at greater risk include:  Musicians.  Carpenters, painters, and plumbers.  Cooks.  Cashiers.  People who work in [employer].  Architect workers.  Butchers.  People who use computers. What are the signs or symptoms? Symptoms of this condition include:  Pain and tenderness in the forearm and the outer part of the elbow. Pain may be felt only when using the arm, or it may be there all the time.  A burning feeling that starts in the elbow and spreads down the forearm.  A weak grip in the hand. How is this diagnosed? This condition may be diagnosed based on:  Your symptoms and medical history.  A physical exam.  X-rays.  MRI. How is this treated? Resting and icing your  arm is often the first treatment. Your health care provider may also recommend:  Medicines to reduce pain and inflammation. These may be in the form of a pill, topical gels, or shots of a steroid medicine (cortisone).  An elbow strap to reduce stress on the area.  Physical therapy. This may include massage or exercises.  An elbow brace to restrict the movements that cause symptoms. If these treatments do not help relieve your symptoms, your health care provider may recommend surgery to remove damaged muscle and reattach healthy muscle to bone. Follow these instructions at home: Activity  Rest your elbow and wrist and avoid activities that cause symptoms, as told by your health care provider.  Do physical therapy exercises as instructed.  If you lift an object, lift it with your palm facing up. This reduces stress on your elbow. Lifestyle  If your tennis elbow is caused by sports, check your equipment and make sure that: ? You are using it correctly. ? It is the best fit for you.  If your tennis elbow is caused by work or computer use, take frequent breaks to stretch your arm. Talk with your manager about ways to manage your condition at work. If you have a brace:  Wear the brace or strap as told by your health care provider. Remove it only as told by your health care provider.  Loosen the brace if your fingers tingle, become numb, or turn cold and blue.  Keep the brace clean.  If the brace is not waterproof, ask if you may remove it for bathing. If you must keep the brace on while bathing: ? Do not let it get wet. ? Cover it with a watertight covering when you take a bath or a shower. General instructions   If directed, put ice on the painful area: ? Put ice in a plastic bag. ? Place a towel between your skin and the bag. ? Leave the ice on for 20 minutes, 2-3 times a day.  Take over-the-counter and prescription medicines only as told by your health care provider.  Keep  all follow-up visits as told by your health care provider. This is important. Contact a health care provider if:  You have pain that gets worse or does not get better with treatment.  You have numbness or weakness in your forearm, hand, or fingers. Summary  Tennis elbow (lateral epicondylitis) is inflammation of tendons in your outer forearm, near your elbow.  Common symptoms include pain and tenderness in your forearm and the outer part of your elbow.  This condition is usually caused by repeatedly extending your wrist, turning your forearm, and using your hands.  The first treatment is often resting and icing your arm to relieve symptoms. Further treatment may include taking medicine, getting physical therapy, wearing a brace or strap, or having surgery. This information is not intended to replace advice given to you by your health care provider. Make sure you discuss any questions you have with your health care provider. Document Revised: 06/10/2018 Document Reviewed: 06/29/2017 Elsevier Patient Education  [place].

## 2020-01-03 ENCOUNTER — Telehealth: Payer: Self-pay

## 2020-01-03 NOTE — Telephone Encounter (Signed)
Called pt but unable to LM due to VM not being set up.  Will attempt to call pt again before I leave for the day.

## 2020-01-03 NOTE — Telephone Encounter (Signed)
Tried calling pt to inform her MD has placed referral for Dermatology there was no answer and not able to leave vm due to vm not set-up.Will retry later...Amber Hicks

## 2020-01-03 NOTE — Telephone Encounter (Signed)
Notified pt MD has place referral for dermatologist../lmb

## 2020-01-03 NOTE — Telephone Encounter (Signed)
Recommend patient husband attend the follow-up visit next week with the patient. I think the problem is tennis elbow.  We are going to try some different bracing.  If not better in 1 week likely will proceed with injection.  Ellard Artis

## 2020-01-03 NOTE — Telephone Encounter (Signed)
Called and spoke to pt, informing her that if her husband wants to know what is discussed at her visits, that we are happy to have him join her for her appointments.  Otherwise, Dr. Georgina Snell would prefer to discuss the pt's medical issues directly w/ the pt.  Reminded pt of her f/u appt next week w/ Dr. Georgina Snell on 01/09/20 and pt verbalizes understanding.

## 2020-01-03 NOTE — Telephone Encounter (Signed)
Patient called stating she would like the exercises for her elbow mailed to her. States she does not know where she put them.

## 2020-01-04 NOTE — Telephone Encounter (Signed)
Mailed exercises to pt as requested.

## 2020-01-09 ENCOUNTER — Ambulatory Visit: Payer: Self-pay

## 2020-01-09 ENCOUNTER — Ambulatory Visit (INDEPENDENT_AMBULATORY_CARE_PROVIDER_SITE_OTHER): Payer: Medicare Other

## 2020-01-09 ENCOUNTER — Ambulatory Visit: Payer: Medicare Other | Admitting: Family Medicine

## 2020-01-09 ENCOUNTER — Encounter: Payer: Self-pay | Admitting: Family Medicine

## 2020-01-09 ENCOUNTER — Other Ambulatory Visit: Payer: Self-pay

## 2020-01-09 VITALS — BP 142/78 | HR 72 | Ht 65.0 in | Wt 164.2 lb

## 2020-01-09 DIAGNOSIS — M25522 Pain in left elbow: Secondary | ICD-10-CM

## 2020-01-09 DIAGNOSIS — M7712 Lateral epicondylitis, left elbow: Secondary | ICD-10-CM

## 2020-01-09 IMAGING — DX DG ELBOW COMPLETE 3+V*L*
4 series · 4 of 4 positions shown · non-contrast
Comparison: None.

CLINICAL DATA: Left elbow pain for 10 days. No known injury.

EXAM:
LEFT ELBOW - COMPLETE 3+ VIEW

[elbow ap]
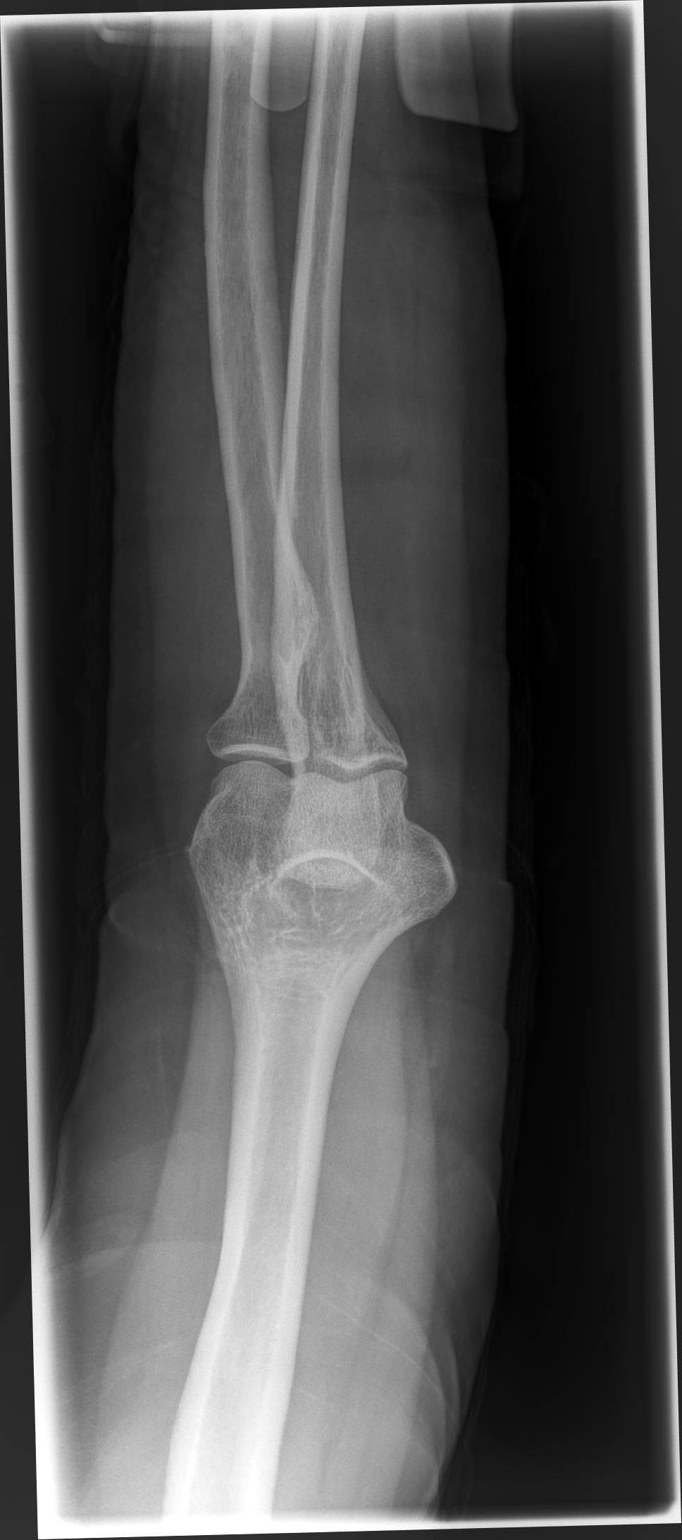

[elbow lmo (1 of 2)]
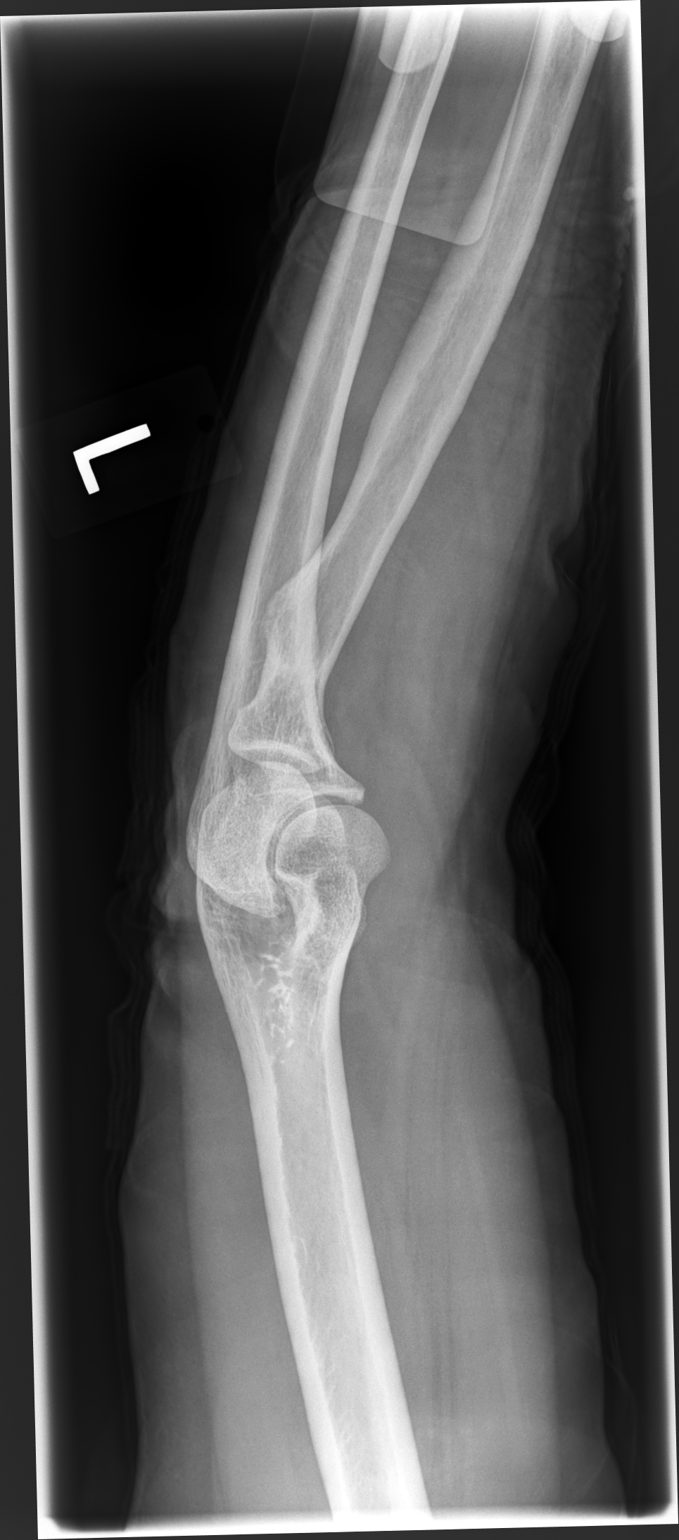

[elbow lmo (2 of 2)]
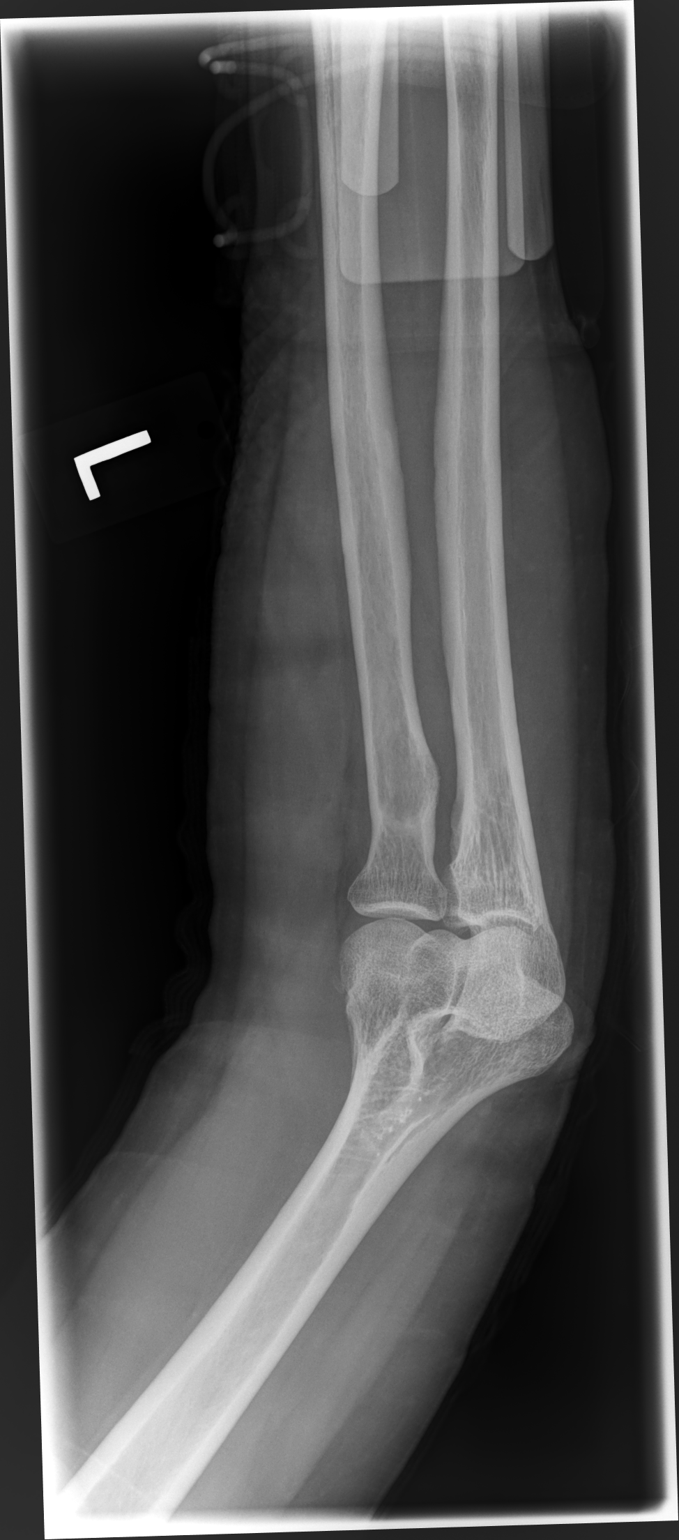

[elbow lat]
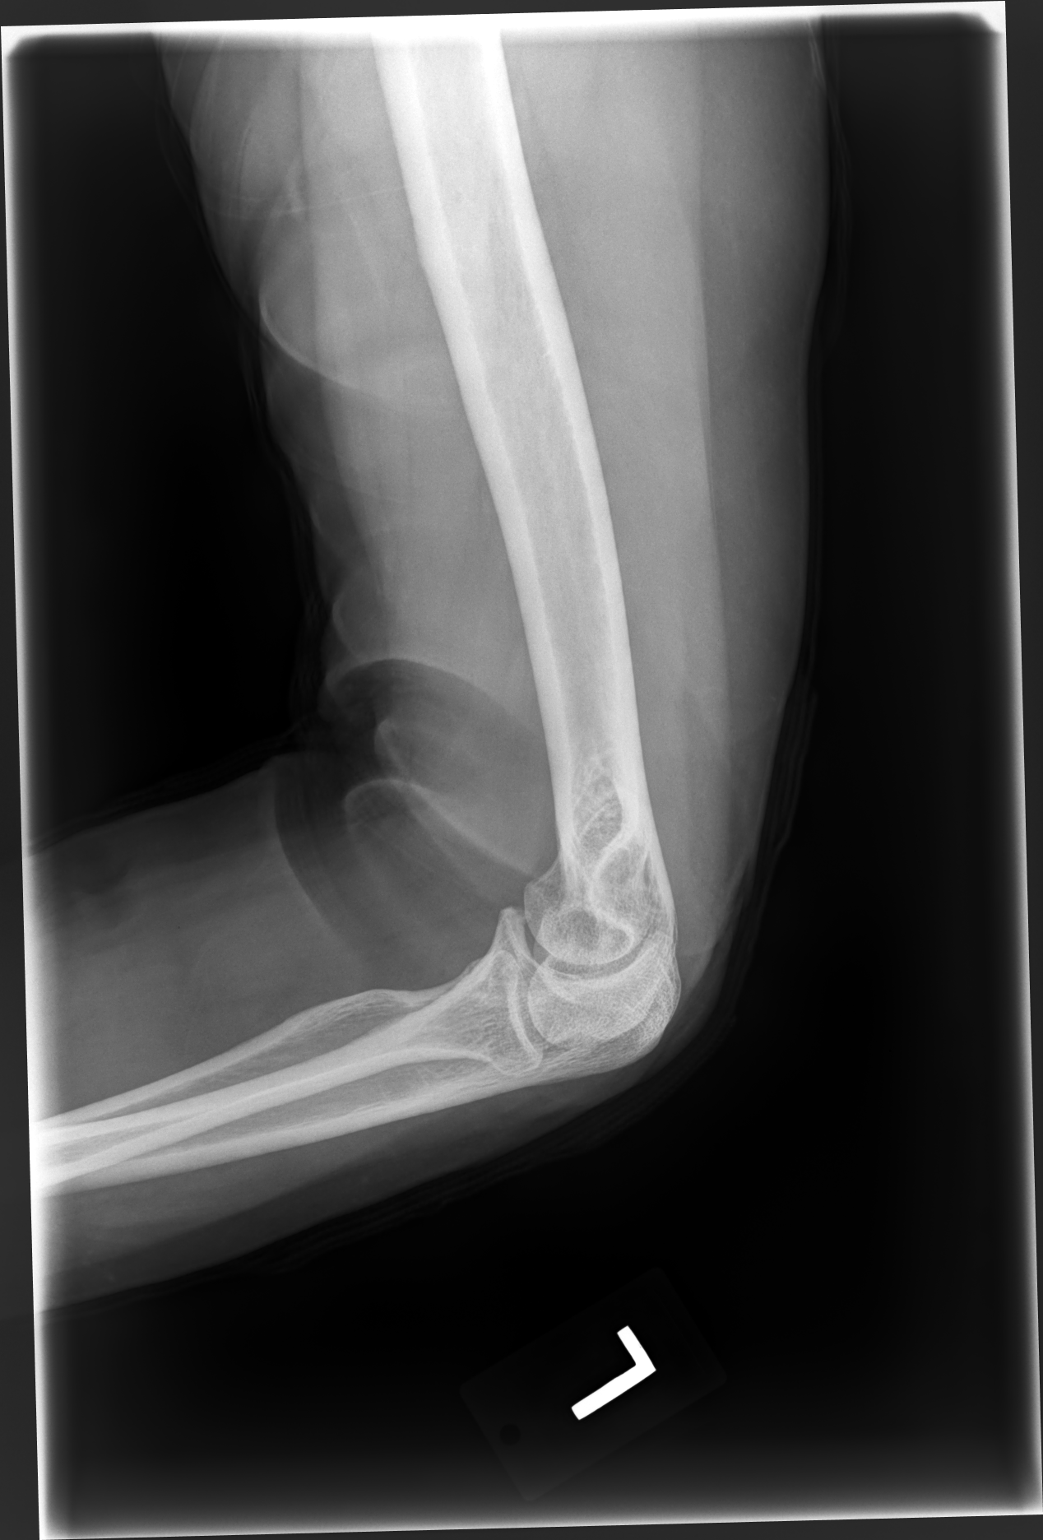

[4 of 4 positions shown; findings below may reference images not displayed]

FINDINGS: There is no evidence of fracture, dislocation, or joint effusion.
The alignment and joint spaces are maintained. Tiny coronoid spur.
Small calcific densities adjacent to the lateral humeral epicondyle.
Soft tissues are unremarkable.
IMPRESSION: 1. Tiny coronoid spur.  No other arthropathy.
2. Small calcific densities adjacent to the lateral humeral
epicondyle, may represent enthesopathy or sequela of remote prior
injury.

## 2020-01-09 NOTE — Patient Instructions (Addendum)
Please perform the exercise program that we have prepared for you and gone over in detail on a daily basis.  In addition to the handout you were provided you can access your program through: [email or web address]   Your unique program code is: [id number]  Call or go to the ER if you develop a large red swollen joint with extreme pain or oozing puss.   Recheck with me in 2 weeks.

## 2020-01-09 NOTE — Progress Notes (Signed)
I, Wendy Poet, LAT, ATC, am serving as scribe for Dr. Lynne Leader.  Amber Hicks is a 69 y.o. female who presents to [place] [facility] today for f/u of L shoulder and L elbow pain x approximately one month.  She was last seen by Dr. Georgina Snell on 01/02/20 and was provided a L wrist brace and advised to use a sling.  She was mailed a HEP focusing on lateral epicondylitis exercises.  She was provided w/ an extended prescription for Prednisone x 5 days and takes Tramadol for pain.  Since her last visit, pt reports that her L elbow pain remains unchanged since her last.  She states that she finished the prednisone and noticed no change/relief.  She con't to take Tramadol.  She states that she did not get her exercises.  She con't to wear her L wrist brace and her arm sling.   Pertinent review of systems: No fevers or chills  Relevant historical information: History of old head injury   Exam:  BP (!) 142/78 (BP Location: Right Arm, Patient Position: Sitting, Cuff Size: Normal)   Pulse 72   Ht 5\' 5"  (1.651 m)   Wt 164 lb 3.2 oz (74.5 kg)   SpO2 97%   BMI 27.32 kg/m  General: Well Developed, well nourished, and in no acute distress.   MSK: Left elbow normal-appearing.  Tender palpation at lateral epicondyle. Normal elbow motion.  Pain with resisted wrist extension.    Lab and Radiology Results  X-ray images left elbow obtained today personally independently reviewed No acute fractures or severe degenerative changes Await formal radiology review  Diagnostic Limited MSK Ultrasound of: Left lateral epicondyle Common extensor tendon insertion onto epicondyle shows hyperechoic change in mid midportion of insertion indicating chronic tendinopathy. Avulsion fleck at tip of lateral epicondyle also indicating lateral epicondylitis Impression: Lateral epicondylitis  Procedure: Real-time Ultrasound Guided Injection of left lateral epicondyle Device: Philips Affiniti  50G Images permanently stored and available for review in the ultrasound unit. Verbal informed consent obtained.  Discussed risks and benefits of procedure. Warned about infection bleeding damage to structures skin hypopigmentation and fat atrophy among others. Patient expresses understanding and agreement Time-out conducted.   Noted no overlying erythema, induration, or other signs of local infection.   Skin prepped in a sterile fashion.   Local anesthesia: Topical Ethyl chloride.   With sterile technique and under real time ultrasound guidance:  40 mg of Kenalog and 2 mL of Marcaine injected .   Patient moved quite a bit during the injection.  Substandard images obtained. Pain immediately resolved suggesting accurate placement of the medication.   Advised to call if fevers/chills, erythema, induration, drainage, or persistent bleeding.   Images permanently stored and available for review in the ultrasound unit.  Impression: Technically successful ultrasound guided injection.   Of note patient was very anxious prior to and during the injection procedure.  I offered to delay the injection or potentially prescribe antianxiety medication to take prior to potential upcoming injection in near future.  She insisted on proceeding with injection.  Happily she had significant pain improvement following injection   Assessment and Plan: 69 y.o. female with left lateral elbow pain.  Lateral epicondylitis.  Failing initial conservative management and very symptomatic.  Patient had great response to injection in clinic today.  Plan to proceed with home exercise program as taught in clinic today by ATC. Continue wrist brace as needed.  Recommend discontinuing shoulder sling. Recheck 2  weeks.    Orders Placed This Encounter  Procedures  . DG ELBOW COMPLETE LEFT (3+VIEW)    Standing Status:   Future    Standing Expiration Date:   03/10/2021    Order Specific Question:   Reason for Exam (SYMPTOM  OR  DIAGNOSIS REQUIRED)    Answer:   eval left elbow pain    Order Specific Question:   Preferred imaging location?    Answer:   [facility]    Order Specific Question:   Radiology Contrast Protocol - do NOT remove file path    Answer:   [email or web address]  . [place] LIMITED JOINT SPACE STRUCTURES UP RIGHT(NO LINKED CHARGES)    Order Specific Question:   Reason for Exam (SYMPTOM  OR DIAGNOSIS REQUIRED)    Answer:   left lat epi condy; shot    Order Specific Question:   Preferred imaging location?    Answer:   [facility]    Order Specific Question:   Release to patient    Answer:   Immediate   No orders of the defined types were placed in this encounter.    Discussed warning signs or symptoms. Please see discharge instructions. Patient expresses understanding.   The above documentation has been reviewed and is accurate and complete Lynne Leader

## 2020-01-10 NOTE — Progress Notes (Signed)
X-ray left elbow shows some changes at the area of pain indicating probably old injury or chronic tennis elbow.  I saw some of these changes and ultrasound as well.

## 2020-01-23 ENCOUNTER — Ambulatory Visit: Payer: Medicare Other | Admitting: Family Medicine

## 2020-01-23 DIAGNOSIS — Z0289 Encounter for other administrative examinations: Secondary | ICD-10-CM

## 2020-01-23 NOTE — Progress Notes (Deleted)
   I, Wendy Poet, LAT, ATC, am serving as scribe for Dr. Lynne Leader.  Amber Hicks is a 69 y.o. female who presents to [place] at [facility] today for f/u of L elbow pain.  She was last seen by Dr. Georgina Snell on 01/09/20 and had a L lateral epicondyl injection.  She was provided w/ a HEP focusing on wrist extensor eccentrics and stretching.  She has been taking Tramadol and has been wearing a L wrist brace and UE sling.  Since her last visit, pt reports   Diagnostic testing: L elbow XR- 01/09/20   Pertinent review of systems: ***  Relevant historical information: ***   Exam:  There were no vitals taken for this visit. General: Well Developed, well nourished, and in no acute distress.   MSK: ***    Lab and Radiology Results No results found for this or any previous visit (from the past 72 hour(s)). No results found.     Assessment and Plan: 69 y.o. female with ***   PDMP not reviewed this encounter. No orders of the defined types were placed in this encounter.  No orders of the defined types were placed in this encounter.    Discussed warning signs or symptoms. Please see discharge instructions. Patient expresses understanding.   ***

## 2020-02-13 ENCOUNTER — Encounter: Payer: Self-pay | Admitting: Internal Medicine

## 2020-02-13 ENCOUNTER — Ambulatory Visit (INDEPENDENT_AMBULATORY_CARE_PROVIDER_SITE_OTHER): Payer: Medicare Other | Admitting: Internal Medicine

## 2020-02-13 ENCOUNTER — Other Ambulatory Visit: Payer: Self-pay

## 2020-02-13 DIAGNOSIS — K644 Residual hemorrhoidal skin tags: Secondary | ICD-10-CM

## 2020-02-13 DIAGNOSIS — E559 Vitamin D deficiency, unspecified: Secondary | ICD-10-CM | POA: Diagnosis not present

## 2020-02-13 DIAGNOSIS — F329 Major depressive disorder, single episode, unspecified: Secondary | ICD-10-CM | POA: Diagnosis not present

## 2020-02-13 DIAGNOSIS — F32A Depression, unspecified: Secondary | ICD-10-CM

## 2020-02-13 NOTE — Assessment & Plan Note (Signed)
Encouraged po replacement

## 2020-02-13 NOTE — Progress Notes (Signed)
Subjective:    Patient ID: Amber Hicks, female    DOB: [birth year], 69 y.o.   MRN: [id number]  HPI   Here after finding of a small lump perianal yesterday, none prior she is aware, no pain bleeding or fever or trauma.  No prior hx of hemorrhoid per pt.  Denies worsening reflux, abd pain, dysphagia, n/v, bowel change or blood.  Pt denies chest pain, increased sob or doe, wheezing, orthopnea, PND, increased LE swelling, palpitations, dizziness or syncope. Pt denies new neurological symptoms such as new headache, or facial or extremity weakness or numbness  Pt denies polydipsia, polyuria  Denies worsening depressive symptoms, suicidal ideation, or panic Past Medical History:  Diagnosis Date  . Depression 01/22/2012  . Hx of adenomatous colonic polyps 01/26/2015  . Hyperlipidemia 01/24/2012  . VITAMIN D DEFICIENCY 07/09/2010   Qualifier: Diagnosis of  By: Jenny Reichmann MD, Hunt Oris    Past Surgical History:  Procedure Laterality Date  . ceaserian section     1 time    reports that she has never smoked. She has never used smokeless tobacco. She reports that she does not drink alcohol or use drugs. family history includes Heart disease in her brother and mother. Allergies  Allergen Reactions  . Doxycycline Rash   Current Outpatient Medications on File Prior to Visit  Medication Sig Dispense Refill  . acetaminophen (TYLENOL) 325 MG tablet Take 650 mg by mouth every 6 (six) hours as needed for mild pain.    Marland Kitchen AFLURIA PRESERVATIVE FREE 0.5 ML SUSY Inject 1 Dose as directed once.  0  . alendronate (FOSAMAX) 70 MG tablet Take 1 tablet (70 mg total) by mouth every 7 (seven) days. Take with a full glass of water on an empty stomach. 12 tablet 1  . diclofenac sodium (VOLTAREN) 1 % GEL Apply 2 g topically 2 (two) times daily as needed. To affected joint. 100 g 11  . HYDROcodone-acetaminophen (NORCO) 7.5-325 MG tablet Take 1 tablet by mouth every 6 (six) hours as needed for moderate pain. (Patient not taking:  Reported on 01/02/2020) 30 tablet 0  . ibuprofen (ADVIL) 600 MG tablet Take 1 tablet (600 mg total) by mouth every 8 (eight) hours as needed for headache. 30 tablet 0  . lovastatin (MEVACOR) 40 MG tablet Take 1 tablet (40 mg total) by mouth at bedtime. 90 tablet 3  . Multiple Vitamin (MULTIVITAMIN) tablet Take 1 tablet by mouth daily.    . SUMAtriptan (IMITREX) 100 MG tablet Take 1 tablet (100 mg total) by mouth every 2 (two) hours as needed for migraine or headache. May repeat in 2 hours if headache persists or recurs. 10 tablet 5  . traMADol (ULTRAM) 50 MG tablet Take 1 tablet (50 mg total) by mouth every 6 (six) hours as needed. 30 tablet 0   No current facility-administered medications on file prior to visit.   Review of Systems All otherwise neg per pt     Objective:   Physical Exam BP 140/80 (BP Location: Left Arm, Patient Position: Sitting, Cuff Size: Large)   Pulse 67   Temp 98.1 F (36.7 C) (Oral)   Ht 5\' 5"  (1.651 m)   Wt 162 lb (73.5 kg)   SpO2 98%   BMI 26.96 kg/m  VS noted,  Constitutional: Pt appears in NAD HENT: Head: NCAT.  Right Ear: External ear normal.  Left Ear: External ear normal.  Eyes: . Pupils are equal, round, and reactive to light. Conjunctivae and EOM are normal  Nose: without d/c or deformity Neck: Neck supple. Gross normal ROM Cardiovascular: Normal rate and regular rhythm.   Pulmonary/Chest: Effort normal and breath sounds without rales or wheezing.  Abd:  Soft, NT, ND, + BS, no organomegaly, rectal exam c/w < 1/2 cm non tender raised but non thrombosed ext hemorrhoid Neurological: Pt is alert. At baseline orientation, motor grossly intact Skin: Skin is warm. No rashes, other new lesions, no LE edema Psychiatric: Pt behavior is normal without agitation  All otherwise neg per pt Lab Results  Component Value Date   WBC 6.7 04/12/2019   HGB 13.8 04/12/2019   HCT 43.8 04/12/2019   PLT 292 04/12/2019   GLUCOSE 90 04/12/2019   CHOL 243 (H)  06/16/2018   TRIG 167.0 (H) 06/16/2018   HDL 42.80 06/16/2018   LDLDIRECT 163.0 01/22/2012   LDLCALC 167 (H) 06/16/2018   ALT 13 04/12/2019   AST 18 04/12/2019   NA 142 04/12/2019   K 4.2 04/12/2019   CL 109 04/12/2019   CREATININE 0.83 04/12/2019   BUN 11 04/12/2019   CO2 22 04/12/2019   TSH 3.48 06/16/2018         Assessment & Plan:

## 2020-02-13 NOTE — Assessment & Plan Note (Addendum)
Newly found but non thrombosed; no specific tx needed, pt reassured  I spent 31 minutes in preparing to see the patient by review of recent labs, imaging and procedures, obtaining and reviewing separately obtained history, communicating with the patient and family or caregiver, ordering medications, tests or procedures, and documenting clinical information in the EHR including the differential Dx, treatment, and any further evaluation and other management of ext hemorrhoid, depression, vit d def

## 2020-02-13 NOTE — Assessment & Plan Note (Signed)
stable overall by history and exam, recent data reviewed with pt, and pt to continue medical treatment as before,  to f/u any worsening symptoms or concerns  

## 2020-02-13 NOTE — Patient Instructions (Signed)
You have a new external hemorrhoid which apparently is not painful  There is no specific treatment for now, as this will heal and improve over about 6 weeks  Please continue all other medications as before, and refills have been done if requested.  Please have the pharmacy call with any other refills you may need.  Please continue your efforts at being more active, low cholesterol diet, and weight control.  Please keep your appointments with your specialists as you may have planned

## 2020-04-19 ENCOUNTER — Ambulatory Visit (INDEPENDENT_AMBULATORY_CARE_PROVIDER_SITE_OTHER): Payer: Medicare Other | Admitting: Internal Medicine

## 2020-04-19 ENCOUNTER — Encounter: Payer: Self-pay | Admitting: Internal Medicine

## 2020-04-19 ENCOUNTER — Other Ambulatory Visit: Payer: Self-pay

## 2020-04-19 VITALS — BP 120/80 | HR 97 | Temp 98.8°F | Ht 65.0 in

## 2020-04-19 DIAGNOSIS — E538 Deficiency of other specified B group vitamins: Secondary | ICD-10-CM | POA: Diagnosis not present

## 2020-04-19 DIAGNOSIS — Z0001 Encounter for general adult medical examination with abnormal findings: Secondary | ICD-10-CM | POA: Diagnosis not present

## 2020-04-19 DIAGNOSIS — M25571 Pain in right ankle and joints of right foot: Secondary | ICD-10-CM

## 2020-04-19 DIAGNOSIS — G44309 Post-traumatic headache, unspecified, not intractable: Secondary | ICD-10-CM | POA: Diagnosis not present

## 2020-04-19 DIAGNOSIS — H6091 Unspecified otitis externa, right ear: Secondary | ICD-10-CM | POA: Insufficient documentation

## 2020-04-19 DIAGNOSIS — S0990XS Unspecified injury of head, sequela: Secondary | ICD-10-CM

## 2020-04-19 DIAGNOSIS — R21 Rash and other nonspecific skin eruption: Secondary | ICD-10-CM | POA: Insufficient documentation

## 2020-04-19 DIAGNOSIS — E559 Vitamin D deficiency, unspecified: Secondary | ICD-10-CM

## 2020-04-19 MED ORDER — KETOCONAZOLE 2 % EX CREA: 1.0000 "application " | TOPICAL_CREAM | Freq: Every day | CUTANEOUS | 2 refills | Status: DC | PRN

## 2020-04-19 MED ORDER — MELOXICAM 15 MG PO TABS: 15.0000 mg | ORAL_TABLET | Freq: Every day | ORAL | 5 refills | Status: DC | PRN

## 2020-04-19 MED ORDER — CIPROFLOXACIN HCL 500 MG PO TABS: 500.0000 mg | ORAL_TABLET | Freq: Two times a day (BID) | ORAL | 0 refills | Status: AC

## 2020-04-19 NOTE — Assessment & Plan Note (Addendum)
Mild to mod, for antibx course, consider ent if not improving,  to f/u any worsening symptoms or concerns  I spent 31 minutes in addition to time for CPX wellness examination in preparing to see the patient by review of recent labs, imaging and procedures, obtaining and reviewing separately obtained history, communicating with the patient and family or caregiver, ordering medications, tests or procedures, and documenting clinical information in the EHR including the differential Dx, treatment, and any further evaluation and other management of right otitis externa, vit d def, right ankle pain, rash, HA

## 2020-04-19 NOTE — Patient Instructions (Signed)
Please take all new medication as prescribed - the cipro antibiotic for the ear, the cream for the rash as needed, and the meloxicam instead the of the advil for Headaches and the right ankle pain  Please call next week for ENT referral if your ear is not improved  Please continue all other medications as before, and refills have been done if requested.  Please have the pharmacy call with any other refills you may need.  Please continue your efforts at being more active, low cholesterol diet, and weight control.  You are otherwise up to date with prevention measures today.  Please keep your appointments with your specialists as you may have planned  Please go to the LAB at the blood drawing area for the tests to be done - early next week such as Monday or Tuesday  You will be contacted by phone if any changes need to be made immediately.  Otherwise, you will receive a letter about your results with an explanation, but please check with MyChart first.  Please remember to sign up for MyChart if you have not done so, as this will be important to you in the future with finding out test results, communicating by private email, and scheduling acute appointments online when needed.  Please make an Appointment to return in 6 months, or sooner if needed

## 2020-04-19 NOTE — Progress Notes (Signed)
Subjective:    Patient ID: Amber Hicks, female    DOB: 04/25/1951, 69 y.o.   MRN: [id number]  HPI  Here for wellness and f/u;  Overall doing ok;  Pt denies Chest pain, worsening SOB, DOE, wheezing, orthopnea, PND, worsening LE edema, palpitations, dizziness or syncope.  Pt denies neurological change such as new headache, facial or extremity weakness.  Pt denies polydipsia, polyuria, or low sugar symptoms. Pt states overall good compliance with treatment and medications, good tolerability, and has been trying to follow appropriate diet.  Pt denies worsening depressive symptoms, suicidal ideation or panic. No fever, night sweats, wt loss, loss of appetite, or other constitutional symptoms.  Pt states good ability with ADL's, has low fall risk, home safety reviewed and adequate, no other significant changes in hearing or vision, and only occasionally active with exercise. Does have right ear pain and reduced hearing without d/c or fever, chills for 1 wk.  No ST, cough or wheezing.  Does also have a rash to the low mid abdomen with itching, worse in the AM it seems.  Also has recurring HA with throbbing and nausea, someties blurry vision usually associated with increased stress, but usually improved with nsaid, and requests refill.  Also with chronic right lateral ankle pain and swelling no change, but keeps hurting most days. tolerting vit D well. Past Medical History:  Diagnosis Date  . Depression 01/22/2012  . Hx of adenomatous colonic polyps 01/26/2015  . Hyperlipidemia 01/24/2012  . VITAMIN D DEFICIENCY 07/09/2010   Qualifier: Diagnosis of  By: Jenny Reichmann MD, Hunt Oris    Past Surgical History:  Procedure Laterality Date  . ceaserian section     1 time    reports that she has never smoked. She has never used smokeless tobacco. She reports that she does not drink alcohol and does not use drugs. family history includes Heart disease in her brother and mother. Allergies  Allergen Reactions  .  Doxycycline Rash   Current Outpatient Medications on File Prior to Visit  Medication Sig Dispense Refill  . acetaminophen (TYLENOL) 325 MG tablet Take 650 mg by mouth every 6 (six) hours as needed for mild pain.    Marland Kitchen AFLURIA PRESERVATIVE FREE 0.5 ML SUSY Inject 1 Dose as directed once.  0  . alendronate (FOSAMAX) 70 MG tablet Take 1 tablet (70 mg total) by mouth every 7 (seven) days. Take with a full glass of water on an empty stomach. 12 tablet 1  . diclofenac sodium (VOLTAREN) 1 % GEL Apply 2 g topically 2 (two) times daily as needed. To affected joint. 100 g 11  . ibuprofen (ADVIL) 600 MG tablet Take 1 tablet (600 mg total) by mouth every 8 (eight) hours as needed for headache. 30 tablet 0  . lovastatin (MEVACOR) 40 MG tablet Take 1 tablet (40 mg total) by mouth at bedtime. 90 tablet 3  . Multiple Vitamin (MULTIVITAMIN) tablet Take 1 tablet by mouth daily.    . SUMAtriptan (IMITREX) 100 MG tablet Take 1 tablet (100 mg total) by mouth every 2 (two) hours as needed for migraine or headache. May repeat in 2 hours if headache persists or recurs. 10 tablet 5  . traMADol (ULTRAM) 50 MG tablet Take 1 tablet (50 mg total) by mouth every 6 (six) hours as needed. 30 tablet 0   No current facility-administered medications on file prior to visit.   Review of Systems All otherwise neg per pt     Objective:  Physical Exam BP 120/80 (BP Location: Left Arm, Patient Position: Sitting, Cuff Size: Large)   Pulse 97   Temp 98.8 F (37.1 C) (Oral)   Ht 5\' 5"  (1.651 m)   SpO2 96%   BMI 26.96 kg/m  VS noted,  Constitutional: Pt appears in NAD HENT: Head: NCAT.  Right Ear: External ear normal. Right ear with 2+ red tender swelling mucous Left Ear: External ear normal.  Eyes: . Pupils are equal, round, and reactive to light. Conjunctivae and EOM are normal Nose: without d/c or deformity Neck: Neck supple. Gross normal ROM Cardiovascular: Normal rate and regular rhythm.   Pulmonary/Chest: Effort  normal and breath sounds without rales or wheezing.  Abd:  Soft, NT, ND, + BS, no organomegaly Neurological: Pt is alert. At baseline orientation, motor grossly intact Skin: Skin is warm. INtertrigo rashes to low mid abd and groin, no LE edema Psychiatric: Pt behavior is normal without agitation  All otherwise neg per pt Lab Results  Component Value Date   WBC 6.7 04/12/2019   HGB 13.8 04/12/2019   HCT 43.8 04/12/2019   PLT 292 04/12/2019   GLUCOSE 90 04/12/2019   CHOL 243 (H) 06/16/2018   TRIG 167.0 (H) 06/16/2018   HDL 42.80 06/16/2018   LDLDIRECT 163.0 01/22/2012   LDLCALC 167 (H) 06/16/2018   ALT 13 04/12/2019   AST 18 04/12/2019   NA 142 04/12/2019   K 4.2 04/12/2019   CL 109 04/12/2019   CREATININE 0.83 04/12/2019   BUN 11 04/12/2019   CO2 22 04/12/2019   TSH 3.48 06/16/2018      Assessment & Plan:

## 2020-04-19 NOTE — Assessment & Plan Note (Signed)
Mild to mod, for mobic prn,,  to f/u any worsening symptoms or concerns 

## 2020-04-19 NOTE — Assessment & Plan Note (Signed)

## 2020-04-19 NOTE — Assessment & Plan Note (Signed)
For ketoconozole cr prn

## 2020-04-23 ENCOUNTER — Other Ambulatory Visit: Payer: Medicare Other

## 2020-04-23 DIAGNOSIS — Z0001 Encounter for general adult medical examination with abnormal findings: Secondary | ICD-10-CM

## 2020-04-23 DIAGNOSIS — E538 Deficiency of other specified B group vitamins: Secondary | ICD-10-CM

## 2020-04-23 DIAGNOSIS — R21 Rash and other nonspecific skin eruption: Secondary | ICD-10-CM | POA: Diagnosis not present

## 2020-04-23 DIAGNOSIS — H6091 Unspecified otitis externa, right ear: Secondary | ICD-10-CM | POA: Diagnosis not present

## 2020-04-23 DIAGNOSIS — M25571 Pain in right ankle and joints of right foot: Secondary | ICD-10-CM | POA: Diagnosis not present

## 2020-04-23 DIAGNOSIS — E559 Vitamin D deficiency, unspecified: Secondary | ICD-10-CM | POA: Diagnosis not present

## 2020-04-24 ENCOUNTER — Telehealth: Payer: Self-pay | Admitting: Internal Medicine

## 2020-04-24 ENCOUNTER — Other Ambulatory Visit: Payer: Self-pay | Admitting: Internal Medicine

## 2020-04-24 ENCOUNTER — Encounter: Payer: Self-pay | Admitting: Internal Medicine

## 2020-04-24 DIAGNOSIS — H9201 Otalgia, right ear: Secondary | ICD-10-CM

## 2020-04-24 LAB — URINALYSIS, ROUTINE W REFLEX MICROSCOPIC
Bacteria, UA: NONE SEEN /HPF
Bilirubin Urine: NEGATIVE
Glucose, UA: NEGATIVE
Hgb urine dipstick: NEGATIVE
Hyaline Cast: NONE SEEN /LPF
Ketones, ur: NEGATIVE
Nitrite: NEGATIVE
Protein, ur: NEGATIVE
RBC / HPF: NONE SEEN /HPF (ref 0–2)
Specific Gravity, Urine: 1.016 (ref 1.001–1.03)
WBC, UA: NONE SEEN /HPF (ref 0–5)
pH: <=5 (ref 5.0–8.0)

## 2020-04-24 LAB — CBC WITH DIFFERENTIAL/PLATELET
Absolute Monocytes: 420 cells/uL (ref 200–950)
Basophils Absolute: 39 cells/uL (ref 0–200)
Basophils Relative: 0.7 %
Eosinophils Absolute: 190 cells/uL (ref 15–500)
Eosinophils Relative: 3.4 %
HCT: 39.9 % (ref 35.0–45.0)
Hemoglobin: 13.1 g/dL (ref 11.7–15.5)
Lymphs Abs: 2061 cells/uL (ref 850–3900)
MCH: 30.9 pg (ref 27.0–33.0)
MCHC: 32.8 g/dL (ref 32.0–36.0)
MCV: 94.1 fL (ref 80.0–100.0)
MPV: 10.8 fL (ref 7.5–12.5)
Monocytes Relative: 7.5 %
Neutro Abs: 2890 cells/uL (ref 1500–7800)
Neutrophils Relative %: 51.6 %
Platelets: 283 10*3/uL (ref 140–400)
RBC: 4.24 10*6/uL (ref 3.80–5.10)
RDW: 12.4 % (ref 11.0–15.0)
Total Lymphocyte: 36.8 %
WBC: 5.6 10*3/uL (ref 3.8–10.8)

## 2020-04-24 LAB — COMPLETE METABOLIC PANEL WITH GFR
AG Ratio: 1.8 (calc) (ref 1.0–2.5)
ALT: 9 U/L (ref 6–29)
AST: 12 U/L (ref 10–35)
Albumin: 3.8 g/dL (ref 3.6–5.1)
Alkaline phosphatase (APISO): 75 U/L (ref 37–153)
BUN: 7 mg/dL (ref 7–25)
CO2: 26 mmol/L (ref 20–32)
Calcium: 8.9 mg/dL (ref 8.6–10.4)
Chloride: 112 mmol/L — ABNORMAL HIGH (ref 98–110)
Creat: 0.88 mg/dL (ref 0.50–0.99)
GFR, Est African American: 79 mL/min/{1.73_m2} (ref >=60)
GFR, Est Non African American: 68 mL/min/{1.73_m2} (ref >=60)
Globulin: 2.1 g/dL (calc) (ref 1.9–3.7)
Glucose, Bld: 83 mg/dL (ref 65–99)
Potassium: 4.3 mmol/L (ref 3.5–5.3)
Sodium: 144 mmol/L (ref 135–146)
Total Bilirubin: 0.3 mg/dL (ref 0.2–1.2)
Total Protein: 5.9 g/dL — ABNORMAL LOW (ref 6.1–8.1)

## 2020-04-24 LAB — LIPID PANEL
Cholesterol: 215 mg/dL — ABNORMAL HIGH (ref <200)
HDL: 42 mg/dL — ABNORMAL LOW (ref >=50)
LDL Cholesterol (Calc): 153 mg/dL (calc) — ABNORMAL HIGH
Non-HDL Cholesterol (Calc): 173 mg/dL (calc) — ABNORMAL HIGH (ref <130)
Total CHOL/HDL Ratio: 5.1 (calc) — ABNORMAL HIGH (ref <5.0)
Triglycerides: 94 mg/dL (ref <150)

## 2020-04-24 LAB — VITAMIN B12: Vitamin B-12: 365 pg/mL (ref 200–1100)

## 2020-04-24 LAB — VITAMIN D 25 HYDROXY (VIT D DEFICIENCY, FRACTURES): Vit D, 25-Hydroxy: 19 ng/mL — ABNORMAL LOW (ref 30–100)

## 2020-04-24 LAB — TSH: TSH: 2.07 mIU/L (ref 0.40–4.50)

## 2020-04-24 MED ORDER — ATORVASTATIN CALCIUM 10 MG PO TABS: 10.0000 mg | ORAL_TABLET | Freq: Every day | ORAL | 3 refills | Status: DC

## 2020-04-24 MED ORDER — VITAMIN D (ERGOCALCIFEROL) 1.25 MG (50000 UNIT) PO CAPS: 50000.0000 [IU] | ORAL_CAPSULE | ORAL | 0 refills | Status: DC

## 2020-04-24 NOTE — Telephone Encounter (Signed)
Received a call from pt inquiring about her referral to see an ENT. No referral has been placed. Can you please put one in for her.  Thanks Cecille Rubin

## 2020-04-24 NOTE — Telephone Encounter (Signed)
See below

## 2020-04-24 NOTE — Telephone Encounter (Signed)
Clearly there is some miscommunication here  The specific instructions to her on July 23 were:  Please take all new medication as prescribed - the cipro antibiotic for the ear, the cream for the rash as needed, and the meloxicam instead the of the advil for Headaches and the right ankle pain   Please call next week for ENT referral if your ear is not improved  I will now place the referral since she is asking for this

## 2020-05-10 NOTE — Telephone Encounter (Addendum)
   Patient calling to report her ear is still bothersome. Requesting referral to ENT, advised patient referral has been placed

## 2020-05-22 ENCOUNTER — Emergency Department (INDEPENDENT_AMBULATORY_CARE_PROVIDER_SITE_OTHER)
Admission: EM | Admit: 2020-05-22 | Discharge: 2020-05-22 | Disposition: A | Discharge status: Home / Self Care | Payer: Medicare Other | Source: Home / Self Care | Attending: Emergency Medicine | Admitting: Emergency Medicine

## 2020-05-22 ENCOUNTER — Other Ambulatory Visit: Payer: Self-pay

## 2020-05-22 ENCOUNTER — Emergency Department (INDEPENDENT_AMBULATORY_CARE_PROVIDER_SITE_OTHER): Payer: Medicare Other

## 2020-05-22 DIAGNOSIS — R079 Chest pain, unspecified: Secondary | ICD-10-CM

## 2020-05-22 DIAGNOSIS — J984 Other disorders of lung: Secondary | ICD-10-CM | POA: Diagnosis not present

## 2020-05-22 DIAGNOSIS — R0789 Other chest pain: Secondary | ICD-10-CM

## 2020-05-22 IMAGING — DX DG CHEST 2V
2 series · 2 of 2 positions shown · non-contrast
Comparison: None.

CLINICAL DATA: Right-sided chest/shoulder pain

EXAM:
CHEST - 2 VIEW

[chest pa]
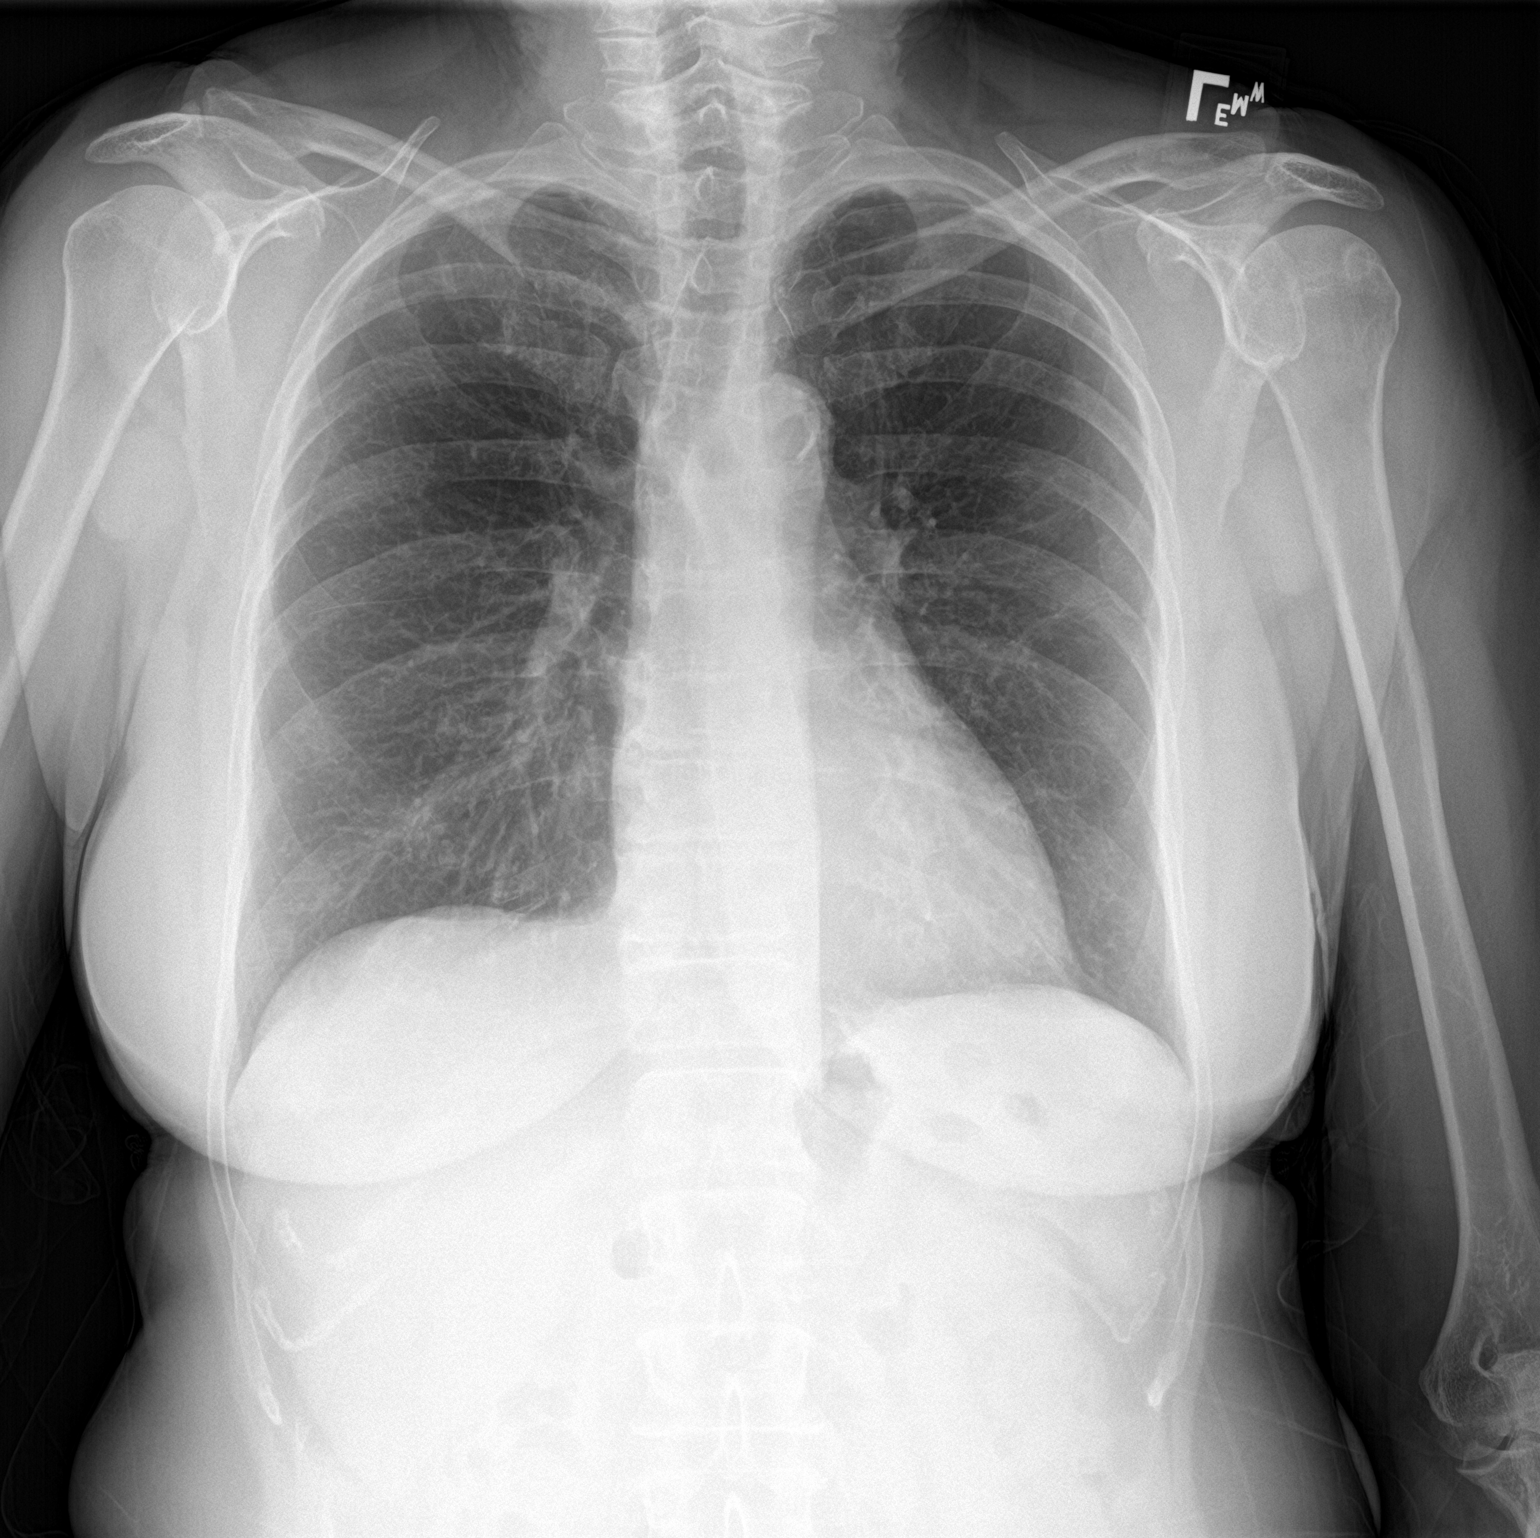

[chest lat]
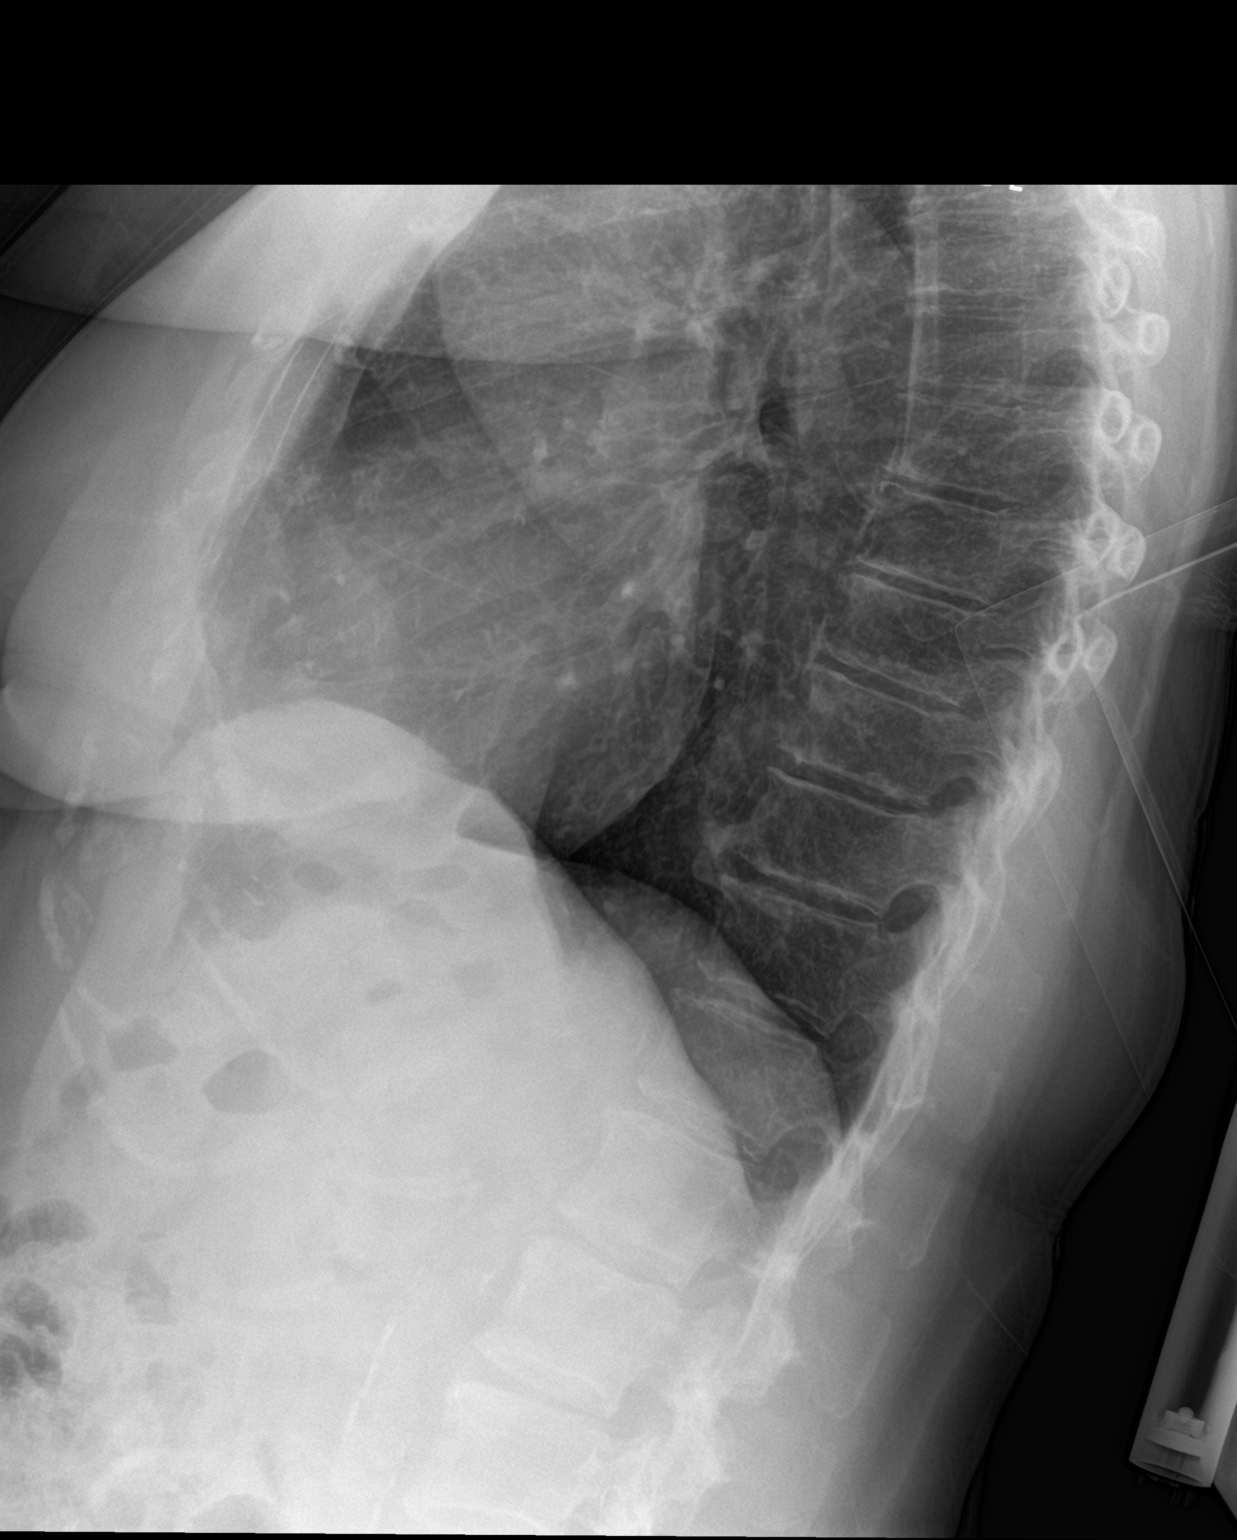

[2 of 2 positions shown; findings below may reference images not displayed]

FINDINGS: The heart size and mediastinal contours are within normal limits.
Aortic atherosclerosis. Both lungs are clear. No pneumothorax or
sizable pleural effusion. Biapical pleuroparenchymal scarring. The
visualized skeletal structures are unremarkable.
IMPRESSION: No active cardiopulmonary disease.

## 2020-05-22 MED ORDER — MELOXICAM 7.5 MG PO TABS: ORAL_TABLET | ORAL | 0 refills | Status: DC

## 2020-05-22 MED ORDER — KETOROLAC TROMETHAMINE 60 MG/2ML IM SOLN
60.0000 mg | Freq: Once | INTRAMUSCULAR | Status: AC
  Administered 2020-05-22: 60 mg via INTRAMUSCULAR

## 2020-05-22 NOTE — Discharge Instructions (Addendum)
Today, EKG and chest x-ray normal. Your right-sided chest pain is likely from inflammation of the chest wall, but no sign of heart or lung problem. Please read attached instruction sheet on chest wall pain. I have sent a prescription for meloxicam pain medicine to your pharmacy. When taken meloxicam for pain, do not take any other NSAIDs such as ibuprofen. Drink plenty of fluids. Stay out of the hot weather for the next couple days. May apply heat at home to the sore area on right chest. Follow-up with your PCP Dr. Jenny Reichmann in 2 days. If any severe worsening symptoms, go to emergency room immediately.

## 2020-05-22 NOTE — ED Triage Notes (Signed)
Patient presents to Urgent Care with complaints of right sided pain that starts on her right shoulder/ right chest since last night. Patient reports it feels like it is stabbing all the way to her back, was unable to sleep through the night r/t the pain.
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
## 2020-05-29 ENCOUNTER — Ambulatory Visit (INDEPENDENT_AMBULATORY_CARE_PROVIDER_SITE_OTHER): Payer: Medicare Other | Admitting: Otolaryngology

## 2020-05-29 ENCOUNTER — Other Ambulatory Visit: Payer: Self-pay

## 2020-05-29 ENCOUNTER — Encounter (INDEPENDENT_AMBULATORY_CARE_PROVIDER_SITE_OTHER): Payer: Self-pay | Admitting: Otolaryngology

## 2020-05-29 VITALS — Temp 97.7°F

## 2020-05-29 DIAGNOSIS — H6123 Impacted cerumen, bilateral: Secondary | ICD-10-CM

## 2020-05-29 DIAGNOSIS — H60311 Diffuse otitis externa, right ear: Secondary | ICD-10-CM | POA: Diagnosis not present

## 2020-05-29 NOTE — Progress Notes (Signed)
HPI: Amber Hicks is a 69 y.o. female who presents is referred by Marton Redwood, MD for evaluation of right ear tenderness and blockage..  Past Medical History:  Diagnosis Date  . Depression 01/22/2012  . Hx of adenomatous colonic polyps 01/26/2015  . Hyperlipidemia 01/24/2012  . VITAMIN D DEFICIENCY 07/09/2010   Qualifier: Diagnosis of  By: Jenny Reichmann MD, Hunt Oris    Past Surgical History:  Procedure Laterality Date  . ceaserian section     1 time   Social History   Socioeconomic History  . Marital status: Married    Spouse name: Not on file  . Number of children: Not on file  . Years of education: Not on file  . Highest education level: Not on file  Occupational History  . Not on file  Tobacco Use  . Smoking status: Never Smoker  . Smokeless tobacco: Never Used  Substance and Sexual Activity  . Alcohol use: No    Alcohol/week: 0.0 standard drinks  . Drug use: No  . Sexual activity: Yes    Birth control/protection: Post-menopausal  Other Topics Concern  . Not on file  Social History Narrative  . Not on file   Social Determinants of Health   Financial Resource Strain:   . Difficulty of Paying Living Expenses: Not on file  Food Insecurity:   . Worried About Charity fundraiser in the Last Year: Not on file  . Ran Out of Food in the Last Year: Not on file  Transportation Needs:   . Lack of Transportation (Medical): Not on file  . Lack of Transportation (Non-Medical): Not on file  Physical Activity:   . Days of Exercise per Week: Not on file  . Minutes of Exercise per Session: Not on file  Stress:   . Feeling of Stress : Not on file  Social Connections:   . Frequency of Communication with Friends and Family: Not on file  . Frequency of Social Gatherings with Friends and Family: Not on file  . Attends Religious Services: Not on file  . Active Member of Clubs or Organizations: Not on file  . Attends Archivist Meetings: Not on file  . Marital Status: Not on  file   Family History  Problem Relation Age of Onset  . Heart disease Mother   . Heart disease Brother   . Colon cancer Neg Hx    Allergies  Allergen Reactions  . Doxycycline Rash   Prior to Admission medications   Medication Sig Start Date End Date Taking? Authorizing Provider  acetaminophen (TYLENOL) 325 MG tablet Take 650 mg by mouth every 6 (six) hours as needed for mild pain.   Yes [provider]  AFLURIA PRESERVATIVE FREE 0.5 ML SUSY Inject 1 Dose as directed once. 06/25/14  Yes [provider]  alendronate (FOSAMAX) 70 MG tablet Take 1 tablet (70 mg total) by mouth every 7 (seven) days. Take with a full glass of water on an empty stomach. 10/04/19  Yes Biagio Borg, MD  atorvastatin (LIPITOR) 10 MG tablet Take 1 tablet (10 mg total) by mouth daily. 04/24/20  Yes Biagio Borg, MD  diclofenac sodium (VOLTAREN) 1 % GEL Apply 2 g topically 2 (two) times daily as needed. To affected joint. 04/14/19  Yes Lyndal Pulley, DO  ibuprofen (ADVIL) 600 MG tablet Take 1 tablet (600 mg total) by mouth every 8 (eight) hours as needed for headache. 04/14/19  Yes Lyndal Pulley, DO  ketoconazole (NIZORAL)  2 % cream Apply 1 application topically daily as needed for irritation. 04/19/20  Yes Biagio Borg, MD  lovastatin (MEVACOR) 40 MG tablet Take 1 tablet (40 mg total) by mouth at bedtime. 06/16/18  Yes Biagio Borg, MD  meloxicam (MOBIC) 7.5 MG tablet Take 1 twice a day as needed for pain. Take with food. (Do not take with any other NSAID.) 05/22/20  Yes Jacqulyn Cane, MD  Multiple Vitamin (MULTIVITAMIN) tablet Take 1 tablet by mouth daily.   Yes [provider]  SUMAtriptan (IMITREX) 100 MG tablet Take 1 tablet (100 mg total) by mouth every 2 (two) hours as needed for migraine or headache. May repeat in 2 hours if headache persists or recurs. 06/16/18  Yes Biagio Borg, MD  traMADol (ULTRAM) 50 MG tablet Take 1 tablet (50 mg total) by mouth every 6 (six) hours as needed.  12/22/19  Yes Biagio Borg, MD  Vitamin D, Ergocalciferol, (DRISDOL) 1.25 MG (50000 UNIT) CAPS capsule Take 1 capsule (50,000 Units total) by mouth every 7 (seven) days. 04/24/20  Yes Biagio Borg, MD     Positive ROS: Otherwise negative  All other systems have been reviewed and were otherwise negative with the exception of those mentioned in the HPI and as above.  Physical Exam: Constitutional: Alert, well-appearing, no acute distress Ears: External ears without lesions or tenderness.  Both ear canals or largely impacted with cerumen right side much worse than left.  This was cleaned with irrigation and suction on the right side and forceps curette and suction on the left side.  TMs were clear bilaterally.  She had mild inflammatory changes of the right ear canal but this should resolve after cleaning all the wax out as this was totally impacted. Nasal: External nose without lesions.. Clear nasal passages Oral: Lips and gums without lesions. Tongue and palate mucosa without lesions. Posterior oropharynx clear. Neck: No palpable adenopathy or masses Respiratory: Breathing comfortably  Skin: No facial/neck lesions or rash noted.  Cerumen impaction removal  Date/Time: 05/29/2020 2:18 PM Performed by: Rozetta Nunnery, MD Authorized by: Rozetta Nunnery, MD   Consent:    Consent obtained:  Verbal   Consent given by:  Patient   Risks discussed:  Pain and bleeding Procedure details:    Location:  L ear and R ear   Procedure type: curette, irrigation, suction and forceps   Post-procedure details:    Inspection:  TM intact and canal normal   Hearing quality:  Improved   Patient tolerance of procedure:  Tolerated well, no immediate complications Comments:     Right ear canal was completely impacted with cerumen that was removed with irrigation and suction.  Left ear canal had less wax buildup that was removed with suction and curettes.  TMs were clear bilaterally.  She had mild  inflammatory changes of the right ear canal and I applied CSF powder to the right ear canal.    Assessment: Bilateral cerumen impactions Mild right external otitis  Plan: Gave her a prescription for Cortisporin otic suspension drops to use if she has any pain in the right ear tomorrow although this should resolve spontaneously after cleaning the wax out. I cautioned her about not using Q-tips in her ears.   Radene Journey, MD   CC:

## 2020-06-15 ENCOUNTER — Other Ambulatory Visit: Payer: Self-pay | Admitting: Internal Medicine

## 2020-06-15 NOTE — Telephone Encounter (Signed)
Please refill as per office routine med refill policy (all routine meds refilled for 3 mo or monthly per pt preference up to one year from last visit, then month to month grace period for 3 mo, then further med refills will have to be denied)  

## 2020-06-24 NOTE — Progress Notes (Signed)
Subjective:    Patient ID: Amber Hicks, female    DOB: [birth year], 69 y.o.   MRN: [id number]  HPI The patient is here for an acute visit.   Right swollen ankle - she is not sure what happened.  A couple of weeks ago she was walking in her yard and her right ankle fell into a hole and she twisted it.  It swelled up and has been very painful.  She has been putting ice on it and propping it up.  The pain radiates up her leg and then back down it.  She denies any changes in color, numbness or tingling in the ankle or foot.  She is having difficulty putting pressure on it or moving the ankle-both cause significant pain.  She is crying intermittently throughout the history and the exam.     Medications and allergies reviewed with patient and updated if appropriate.  Patient Active Problem List   Diagnosis Date Noted  . Right otitis externa 04/19/2020  . Rash 04/19/2020  . External hemorrhoids without complication 64/33/2951  . Left shoulder pain 12/23/2019  . Left lateral epicondylitis 12/23/2019  . Medial epicondylitis of elbow, left 12/23/2019  . Pain of right hand 01/04/2019  . Pain of left middle finger 12/19/2018  . Abdominal pain 08/10/2018  . Right ankle pain 06/16/2018  . Low back pain 06/16/2018  . Left leg pain 08/09/2015  . Hx of adenomatous colonic polyps 01/26/2015  . 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 Vertigo 08/17/2014  . Hidradenitis 04/05/2013  . Hyperlipidemia 01/24/2012  . Depression 01/22/2012  . Encounter for preventative adult health care exam with abnormal findings 01/15/2012  . Vitamin D deficiency 07/09/2010    Current Outpatient Medications on File Prior to Visit  Medication Sig Dispense Refill  . acetaminophen (TYLENOL) 325 MG tablet Take 650 mg by mouth every 6 (six) hours as needed for mild pain.    Marland Kitchen AFLURIA PRESERVATIVE FREE 0.5 ML SUSY Inject 1 Dose as directed once.  0  . alendronate (FOSAMAX) 70 MG tablet TAKE 1 TABLET BY  MOUTH EVERY 7 DAYS. TAKE WITH A FULL GLASS OF WATER ON AN EMPTY STOMACH. 12 tablet 1  . atorvastatin (LIPITOR) 10 MG tablet Take 1 tablet (10 mg total) by mouth daily. 90 tablet 3  . diclofenac sodium (VOLTAREN) 1 % GEL Apply 2 g topically 2 (two) times daily as needed. To affected joint. 100 g 11  . ibuprofen (ADVIL) 600 MG tablet Take 1 tablet (600 mg total) by mouth every 8 (eight) hours as needed for headache. 30 tablet 0  . ketoconazole (NIZORAL) 2 % cream Apply 1 application topically daily as needed for irritation. 60 g 2  . lovastatin (MEVACOR) 40 MG tablet Take 1 tablet (40 mg total) by mouth at bedtime. 90 tablet 3  . meloxicam (MOBIC) 7.5 MG tablet Take 1 twice a day as needed for pain. Take with food. (Do not take with any other NSAID.) 14 tablet 0  . Multiple Vitamin (MULTIVITAMIN) tablet Take 1 tablet by mouth daily.    . SUMAtriptan (IMITREX) 100 MG tablet Take 1 tablet (100 mg total) by mouth every 2 (two) hours as needed for migraine or headache. May repeat in 2 hours if headache persists or recurs. 10 tablet 5  . traMADol (ULTRAM) 50 MG tablet Take 1 tablet (50 mg total) by mouth every 6 (six) hours as needed. 30 tablet 0  . Vitamin D, Ergocalciferol, (DRISDOL) 1.25  MG (50000 UNIT) CAPS capsule Take 1 capsule (50,000 Units total) by mouth every 7 (seven) days. 12 capsule 0   No current facility-administered medications on file prior to visit.    Past Medical History:  Diagnosis Date  . Depression 01/22/2012  . Hx of adenomatous colonic polyps 01/26/2015  . Hyperlipidemia 01/24/2012  . VITAMIN D DEFICIENCY 07/09/2010   Qualifier: Diagnosis of  By: Jenny Reichmann MD, Hunt Oris     Past Surgical History:  Procedure Laterality Date  . ceaserian section     1 time    Social History   Socioeconomic History  . Marital status: Married    Spouse name: Not on file  . Number of children: Not on file  . Years of education: Not on file  . Highest education level: Not on file  Occupational  History  . Not on file  Tobacco Use  . Smoking status: Never Smoker  . Smokeless tobacco: Never Used  Substance and Sexual Activity  . Alcohol use: No    Alcohol/week: 0.0 standard drinks  . Drug use: No  . Sexual activity: Yes    Birth control/protection: Post-menopausal  Other Topics Concern  . Not on file  Social History Narrative  . Not on file   Social Determinants of Health   Financial Resource Strain:   . Difficulty of Paying Living Expenses: Not on file  Food Insecurity:   . Worried About Charity fundraiser in the Last Year: Not on file  . Ran Out of Food in the Last Year: Not on file  Transportation Needs:   . Lack of Transportation (Medical): Not on file  . Lack of Transportation (Non-Medical): Not on file  Physical Activity:   . Days of Exercise per Week: Not on file  . Minutes of Exercise per Session: Not on file  Stress:   . Feeling of Stress : Not on file  Social Connections:   . Frequency of Communication with Friends and Family: Not on file  . Frequency of Social Gatherings with Friends and Family: Not on file  . Attends Religious Services: Not on file  . Active Member of Clubs or Organizations: Not on file  . Attends Archivist Meetings: Not on file  . Marital Status: Not on file    Family History  Problem Relation Age of Onset  . Heart disease Mother   . Heart disease Brother   . Colon cancer Neg Hx     Review of Systems  Constitutional: Negative for chills and fever.  Skin: Negative for color change and wound.  Neurological: Negative for numbness.       Objective:   Vitals:   06/25/20 1030  BP: (!) 155/90  Pulse: 84  Temp: 98.1 F (36.7 C)  SpO2: 96%   BP Readings from Last 3 Encounters:  06/25/20 (!) 155/90  06/25/20 (!) 155/90  05/22/20 108/66   Wt Readings from Last 3 Encounters:  06/25/20 162 lb (73.5 kg)  06/25/20 162 lb 12.8 oz (73.8 kg)  02/13/20 162 lb (73.5 kg)   Body mass index is 27.09 kg/m.    Physical Exam Constitutional:      General: She is not in acute distress (In obvious pain, especially with any movement of the ankle).    Appearance: Normal appearance. She is not ill-appearing.  Musculoskeletal:     Comments: Swelling and tenderness to light palpation lateral ankle.  No medial ankle, distal foot, Achilles or calf tenderness.  No bruising, erythema  or lesions.  Normal sensation in foot and ankle.  Difficult to appreciate range of motion secondary to pain with movement.  Skin:    General: Skin is warm and dry.     Findings: No bruising or erythema.  Neurological:     Mental Status: She is alert.            Assessment & Plan:    See Problem List for Assessment and Plan of chronic medical problems.    This visit occurred during the SARS-CoV-2 public health emergency.  Safety protocols were in place, including screening questions prior to the visit, additional usage of staff PPE, and extensive cleaning of exam room while observing appropriate contact time as indicated for disinfecting solutions.

## 2020-06-25 ENCOUNTER — Ambulatory Visit (INDEPENDENT_AMBULATORY_CARE_PROVIDER_SITE_OTHER): Payer: Medicare Other | Admitting: Internal Medicine

## 2020-06-25 ENCOUNTER — Encounter: Payer: Self-pay | Admitting: Family Medicine

## 2020-06-25 ENCOUNTER — Encounter: Payer: Self-pay | Admitting: Internal Medicine

## 2020-06-25 ENCOUNTER — Ambulatory Visit (INDEPENDENT_AMBULATORY_CARE_PROVIDER_SITE_OTHER): Payer: Medicare Other | Admitting: Family Medicine

## 2020-06-25 ENCOUNTER — Other Ambulatory Visit: Payer: Self-pay

## 2020-06-25 ENCOUNTER — Ambulatory Visit (INDEPENDENT_AMBULATORY_CARE_PROVIDER_SITE_OTHER): Payer: Medicare Other

## 2020-06-25 VITALS — BP 155/90 | HR 84 | Ht 65.0 in | Wt 162.0 lb

## 2020-06-25 VITALS — BP 155/90 | HR 84 | Temp 98.1°F | Ht 65.0 in | Wt 162.8 lb

## 2020-06-25 DIAGNOSIS — S93401A Sprain of unspecified ligament of right ankle, initial encounter: Secondary | ICD-10-CM | POA: Diagnosis not present

## 2020-06-25 DIAGNOSIS — M25571 Pain in right ankle and joints of right foot: Secondary | ICD-10-CM

## 2020-06-25 DIAGNOSIS — IMO0001 Reserved for inherently not codable concepts without codable children: Secondary | ICD-10-CM

## 2020-06-25 IMAGING — DX DG ANKLE COMPLETE 3+V*R*
3 series · 3 of 3 positions shown · non-contrast
Comparison: [DATE]

CLINICAL DATA: Twisting injury 2 weeks ago with persistent ankle
pain, initial encounter

EXAM:
RIGHT ANKLE - COMPLETE 3+ VIEW

[ankle ap]
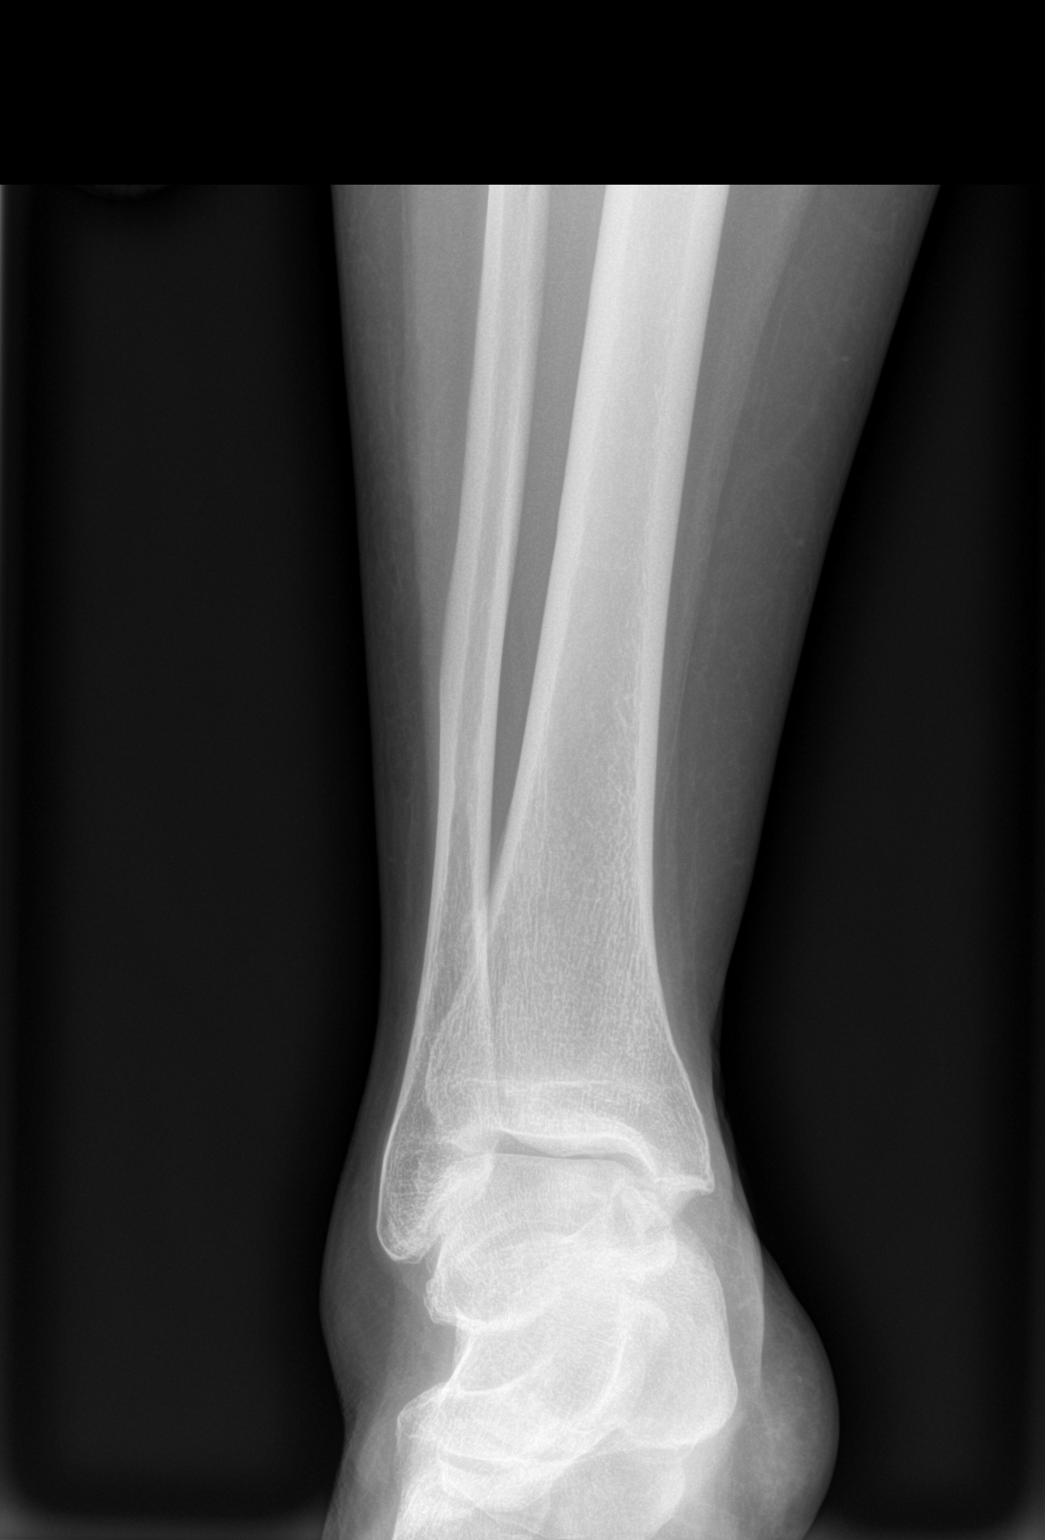

[ankle obl]
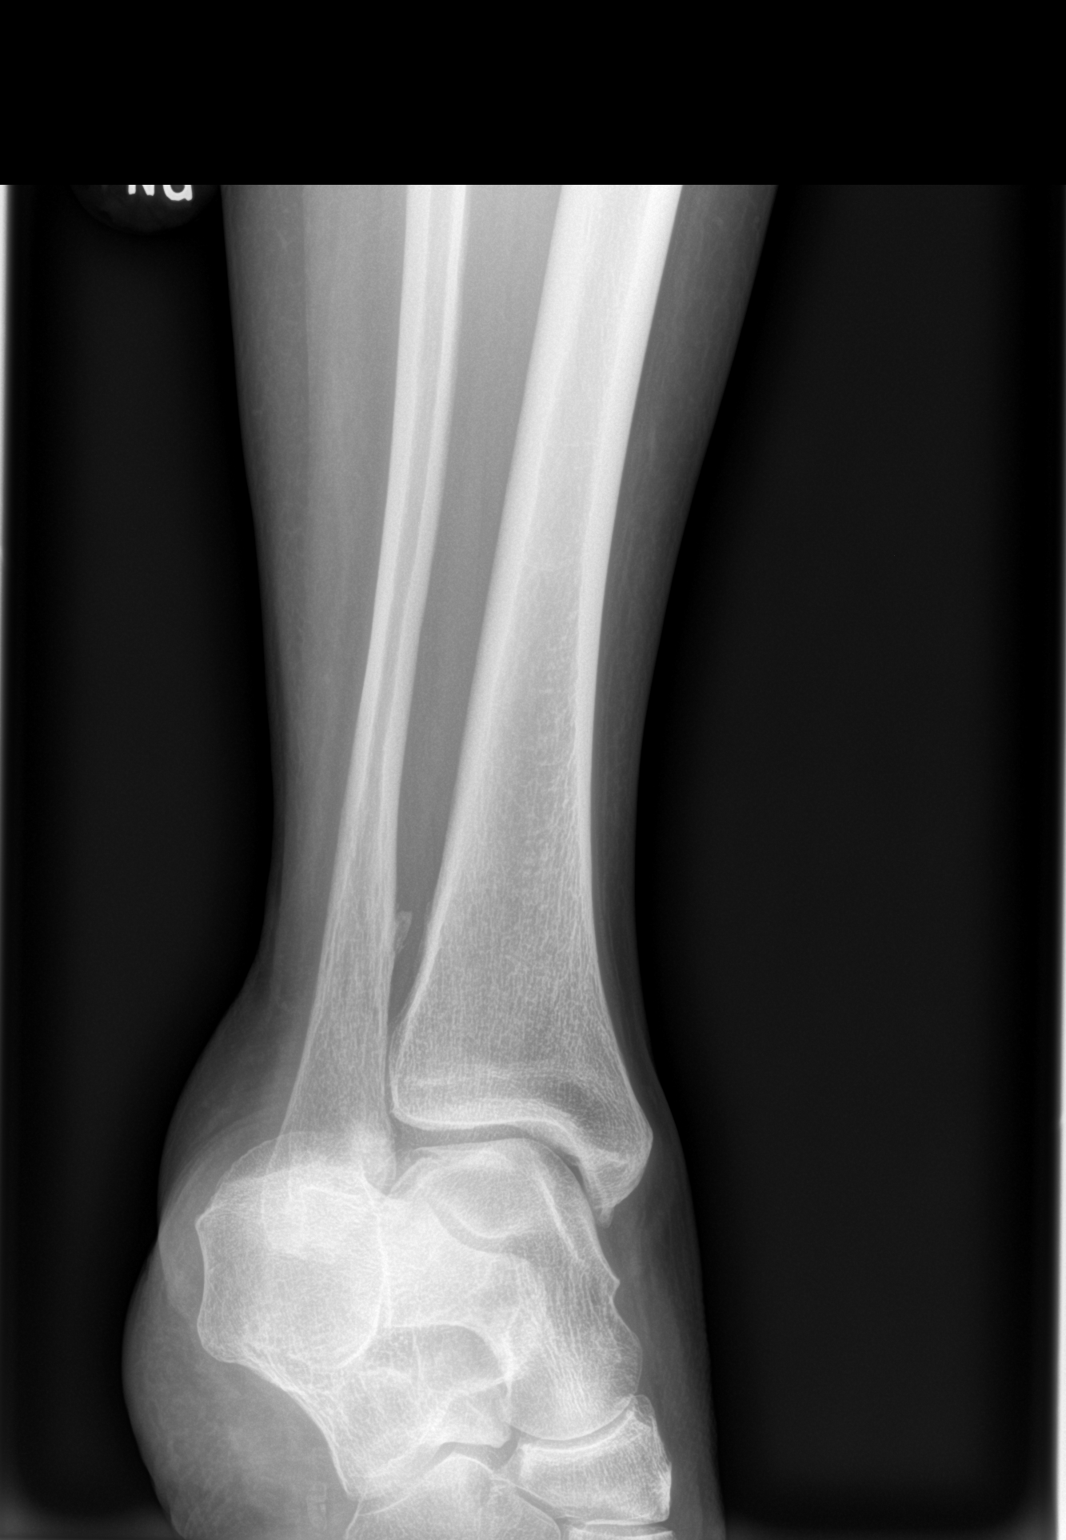

[ankle lat]
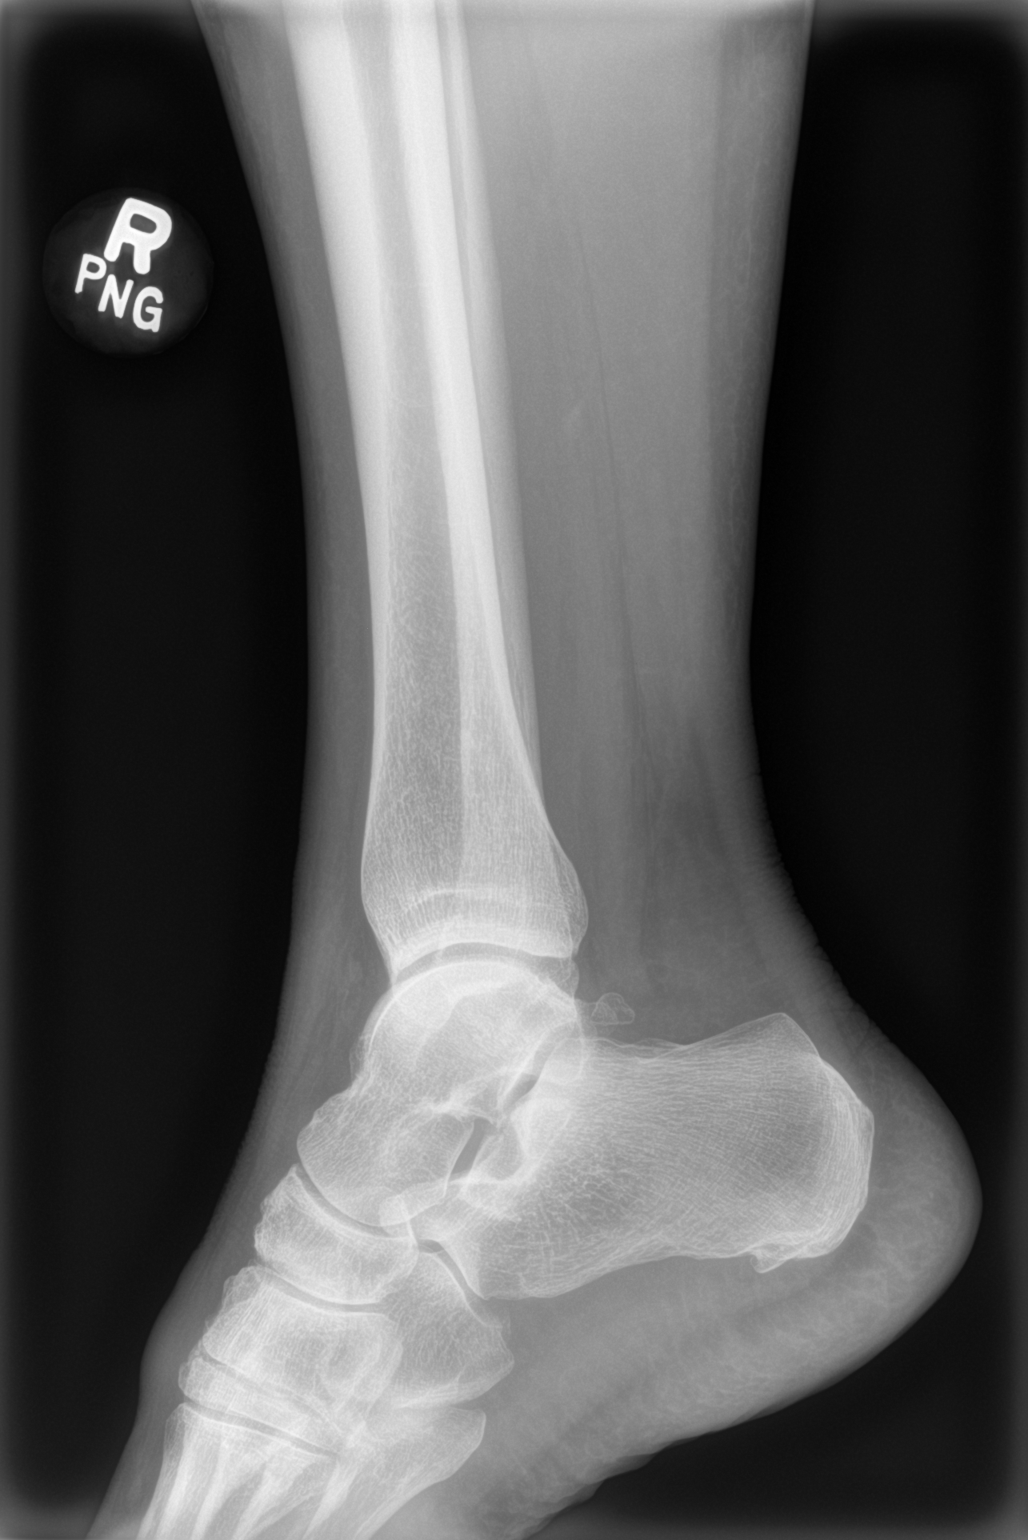

[3 of 3 positions shown; findings below may reference images not displayed]

FINDINGS: No acute fracture or dislocation is noted. No soft tissue
abnormality is seen.
IMPRESSION: No acute abnormality noted.

## 2020-06-25 NOTE — Assessment & Plan Note (Signed)
Acute pain since twisting her ankle 2 weeks ago Pain seems to be significant and having difficulty walking She has been icing and elevating Concern for possible fracture X-ray today Will provide crutches Will await x-ray before discussing pain management Given degree of pain and possible fracture will see if sports medicine can see her following the x-ray to help stabilize the joint

## 2020-06-25 NOTE — Patient Instructions (Signed)
We have referred you to Home Health PT.  If you don't hear from them in one week, please call us and let us know so we can follow-up.  Please schedule a 3 week follow-up visit w/ Dr. Georgina Snell.  Use compression wrap on the ankle.  Wrap from the toes up toward the ankle and lower leg.

## 2020-06-25 NOTE — Patient Instructions (Signed)
Have an xray today.  We will determine treatment after that.

## 2020-06-25 NOTE — Progress Notes (Signed)
    Subjective:    CC: Ankle injury  HPI: She injured her right ankle about 2 weeks ago when she stepped in a hole.  She had pain and swelling and difficulty with weightbearing.  She was seen by her PCP today who obtained x-ray.  She was in quite a bit of pain and I agreed to see her right after her x-ray.  PCP provided her with some crutches.  Pertinent review of Systems: No fevers or chills  Relevant historical information: Previous head injury.   Objective:    Vitals:   06/25/20 1130  BP: (!) 155/90  Pulse: 84  SpO2: 96%   General: Well Developed, well nourished, and in no acute distress.   MSK: Right ankle slightly swollen at inferior to posterior lateral malleolus otherwise normal. Tender palpation inferior to the lateral malleolus. Guarding with ligament exam testing nondiagnostic.  Range of motion somewhat limited due to pain and guarding.  Antalgic gait.  Lab and Radiology Results No results found for this or any previous visit (from the past 72 hour(s)). DG Ankle Complete Right  Result Date: 06/25/2020 CLINICAL DATA:  Twisting injury 2 weeks ago with persistent ankle pain, initial encounter EXAM: RIGHT ANKLE - COMPLETE 3+ VIEW COMPARISON:  04/12/2019 FINDINGS: No acute fracture or dislocation is noted. No soft tissue abnormality is seen. IMPRESSION: No acute abnormality noted. Electronically Signed   By: Inez Catalina M.D.   On: 06/25/2020 11:17   I, Lynne Leader, personally (independently) visualized and performed the interpretation of the images attached in this note. No fractures visible.  No significant DJD.   Impression and Recommendations:    Assessment and Plan: 69 y.o. female with right ankle sprain or soft tissue injury.  Doubtful for fracture based on x-ray appearance.  Regardless patient has much more pain than I would expect for this injury especially 38 weeks old.  It is possible she has a radiographically occult injury.  Discussed options.  Plan for  home health physical therapy as she does not drive continued crutches and will provide Ace wrap.   Normally in this scenario would be using ASO ankle brace or even a cam walker boot.  I am worried about her ability to really coordinate the use of that based on prior experience with her.  Therefore Ace wrap is probably the best most comfortable option at this point.  We will recheck in about a month.  Return sooner if needed.Marland Kitchen  PDMP not reviewed this encounter. Orders Placed This Encounter  Procedures  . Ambulatory referral to Home Health    Referral Priority:   Routine    Referral Type:   Home Health Care    Referral Reason:   Specialty Services Required    Requested Specialty:   [place]    Number of Visits Requested:   1   No orders of the defined types were placed in this encounter.   Discussed warning signs or symptoms. Please see discharge instructions. Patient expresses understanding.   The above documentation has been reviewed and is accurate and complete Lynne Leader, M.D.

## 2020-06-28 ENCOUNTER — Telehealth: Payer: Self-pay | Admitting: Family Medicine
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
## 2020-06-28 NOTE — Telephone Encounter (Signed)
I called Amber Hicks back.  Will follow up as scheduled on the 17th. Pt not safe for home health PT

## 2020-07-16 ENCOUNTER — Other Ambulatory Visit: Payer: Self-pay

## 2020-07-16 ENCOUNTER — Encounter: Payer: Self-pay | Admitting: Family Medicine

## 2020-07-16 ENCOUNTER — Ambulatory Visit: Payer: Self-pay

## 2020-07-16 ENCOUNTER — Ambulatory Visit (INDEPENDENT_AMBULATORY_CARE_PROVIDER_SITE_OTHER): Payer: Medicare Other

## 2020-07-16 ENCOUNTER — Ambulatory Visit (INDEPENDENT_AMBULATORY_CARE_PROVIDER_SITE_OTHER): Payer: Medicare Other | Admitting: Family Medicine

## 2020-07-16 VITALS — BP 110/70 | HR 80 | Ht 65.0 in

## 2020-07-16 DIAGNOSIS — M7671 Peroneal tendinitis, right leg: Secondary | ICD-10-CM

## 2020-07-16 DIAGNOSIS — M7989 Other specified soft tissue disorders: Secondary | ICD-10-CM | POA: Diagnosis not present

## 2020-07-16 DIAGNOSIS — G8929 Other chronic pain: Secondary | ICD-10-CM

## 2020-07-16 DIAGNOSIS — M25571 Pain in right ankle and joints of right foot: Secondary | ICD-10-CM

## 2020-07-16 IMAGING — DX DG ANKLE COMPLETE 3+V*R*
3 series · 3 of 3 positions shown · non-contrast
Comparison: None.

CLINICAL DATA: Ankle pain, swelling.  Rolled ankle 3 weeks ago.

EXAM:
RIGHT ANKLE - COMPLETE 3+ VIEW

[ankle ap]
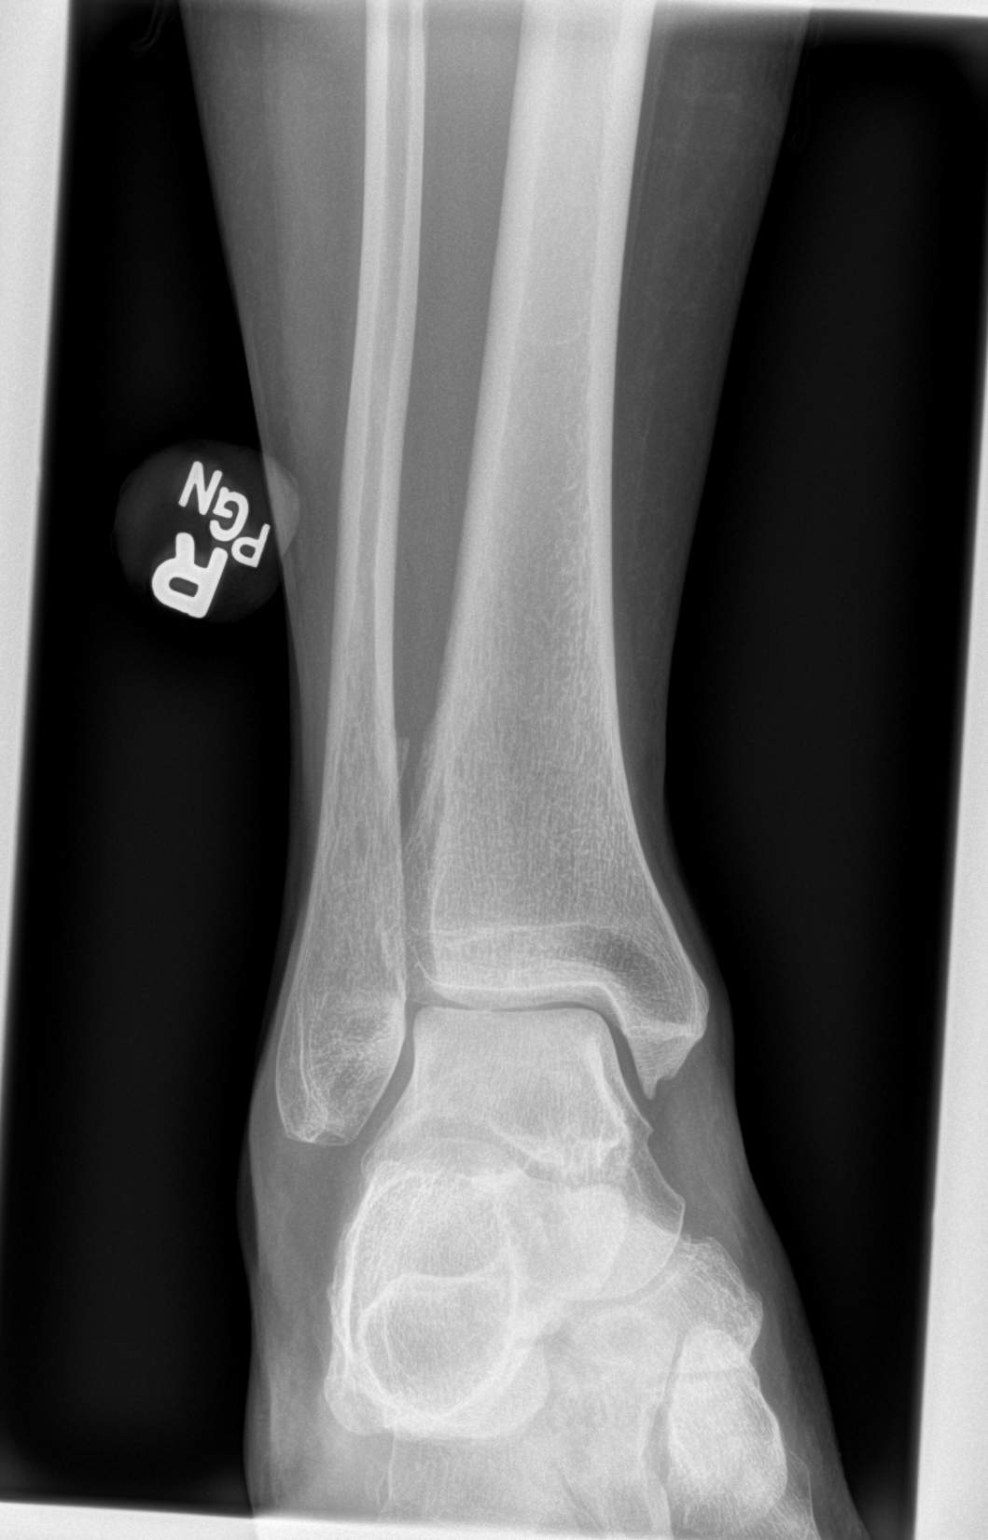

[ankle obl]
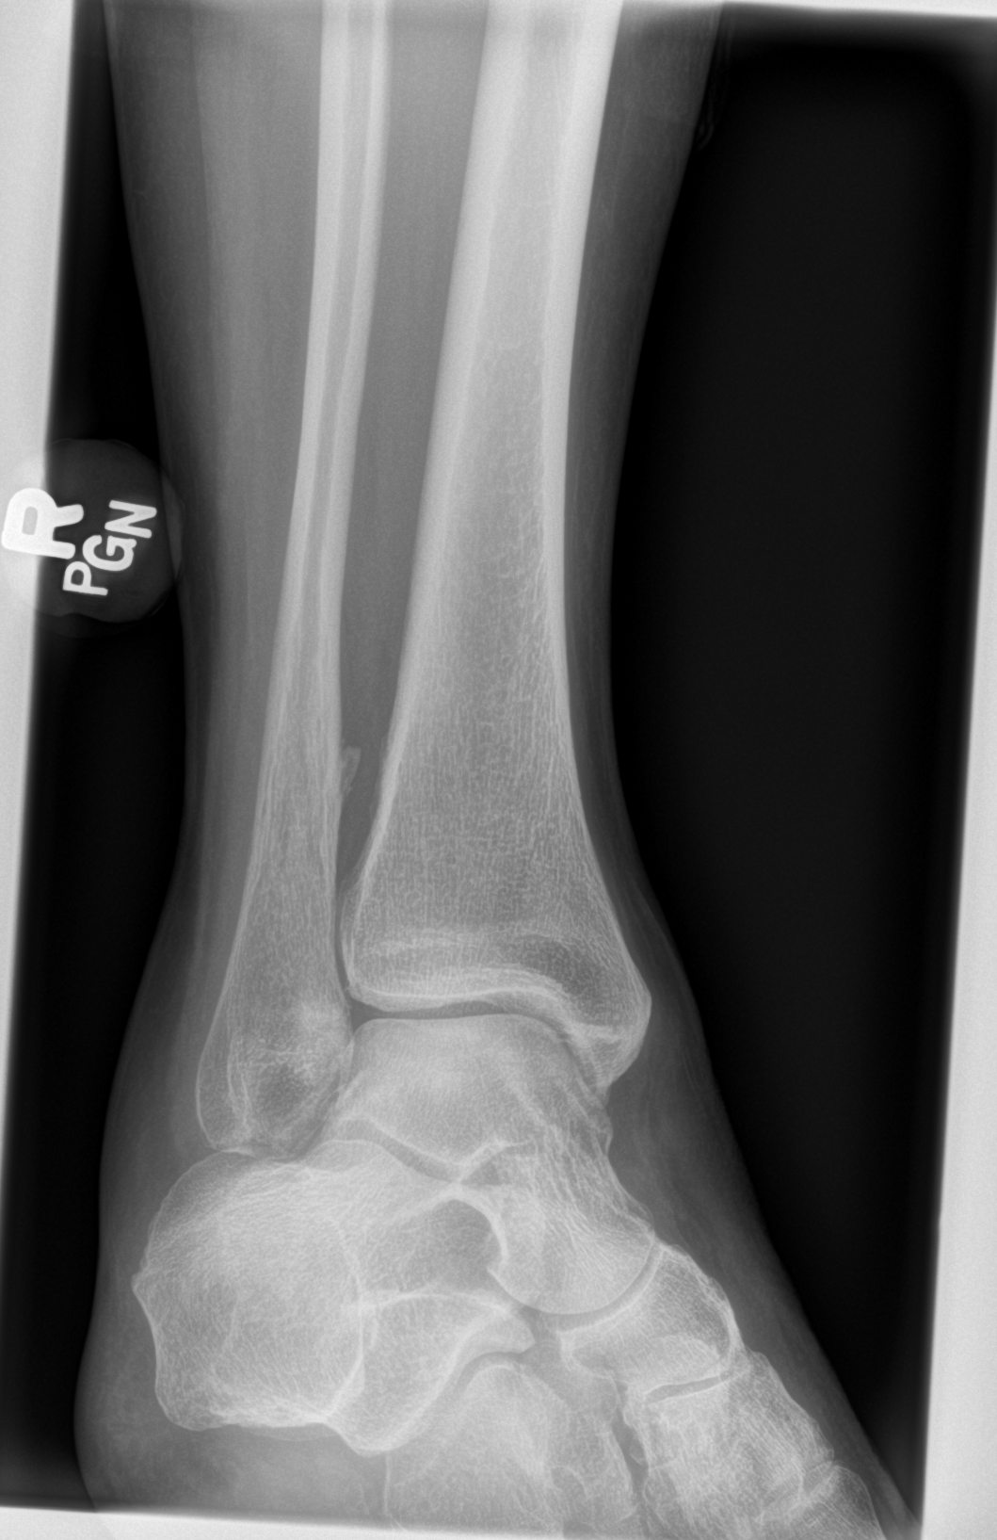

[ankle lat]
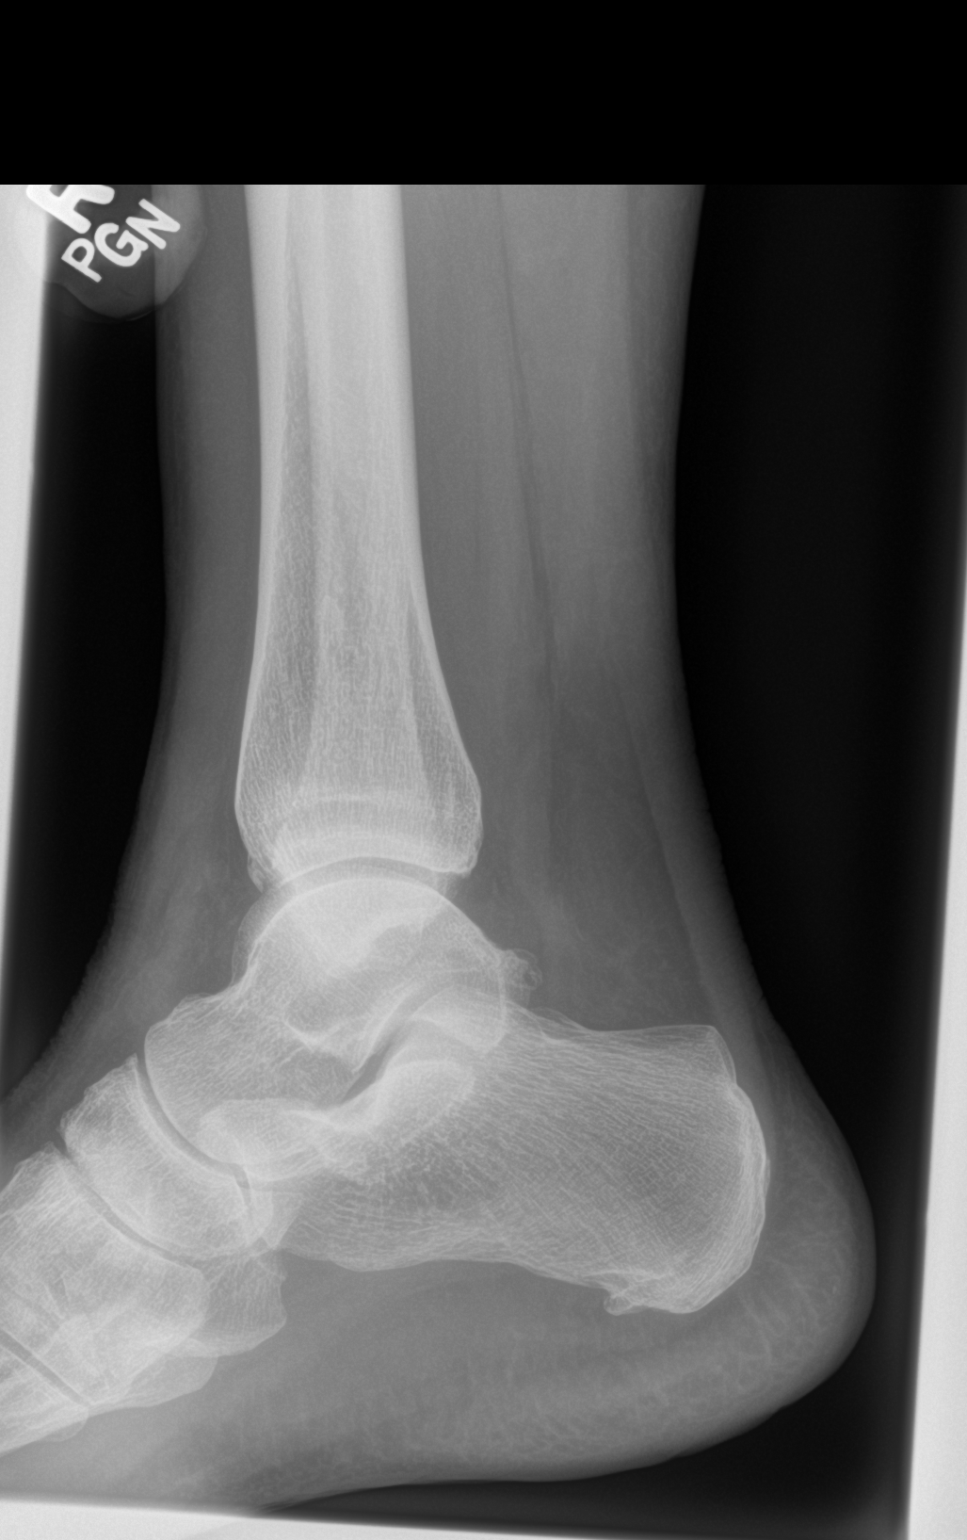

[3 of 3 positions shown; findings below may reference images not displayed]

FINDINGS: No acute bony abnormality. Specifically, no fracture, subluxation,
or dislocation.
IMPRESSION: No acute bony abnormality.

## 2020-07-16 MED ORDER — DICLOFENAC SODIUM 1 % EX GEL: 4.0000 g | Freq: Four times a day (QID) | CUTANEOUS | 11 refills | Status: DC

## 2020-07-16 NOTE — Patient Instructions (Signed)
Thank you for coming in today.  I've referred you to Physical Therapy.  Let us know if you don't hear from them in one week.  Please get an Xray today before you leave  Recheck in 1 month.  Let me know sooner if you have a problem getting to physical therapy or are worsening.    Peroneal Tendinopathy Rehab Ask your health care provider which exercises are safe for you. Do exercises exactly as told by your health care provider and adjust them as directed. It is normal to feel mild stretching, pulling, tightness, or discomfort as you do these exercises. Stop right away if you feel sudden pain or your pain gets worse. Do not begin these exercises until told by your health care provider. Stretching and range-of-motion exercises These exercises warm up your muscles and joints and improve the movement and flexibility of your ankle. These exercises also help to relieve pain and stiffness. Gastroc and soleus stretch, standing This is an exercise in which you stand on a step and use your body weight to stretch your calf muscles. To do this exercise: 1. Stand on the edge of a step on the ball of your left / right foot. The ball of your foot is on the walking surface, right under your toes. 2. Keep your other foot firmly on the same step. 3. Hold on to the wall, a railing, or a chair for balance. 4. Slowly lift your other foot, allowing your body weight to press your left / right heel down over the edge of the step. You should feel a stretch in your left / right calf (gastrocnemius and soleus). 5. Hold this position for __________ seconds. 6. Return both feet to the step. 7. Repeat this exercise with a slight bend in your left / right knee. Repeat __________ times with your left / right knee straight and __________ times with your left / right knee bent. Complete this exercise __________ times a day. Strengthening exercises These exercises build strength and endurance in your foot and ankle. Endurance  is the ability to use your muscles for a long time, even after they get tired. Ankle dorsiflexion with band  1. Secure a rubber exercise band or tube to an object, such as a table leg, that will not move when the band is pulled. 2. Secure the other end of the band around your left / right foot. 3. Sit on the floor, facing the object with your left / right leg extended. The band or tube should be slightly tense when your foot is relaxed. 4. Slowly flex your left / right ankle and toes to bring your foot toward you (dorsiflexion). 5. Hold this position for __________ seconds. 6. Let the band or tube slowly pull your foot back to the starting position. Repeat __________ times. Complete this exercise __________ times a day. Ankle eversion 1. Sit on the floor with your legs straight out in front of you. 2. Loop a rubber exercise band or tube around the ball of your left / right foot. The ball of your foot is on the walking surface, right under your toes. 3. Hold the ends of the band in your hands, or secure the band to a stable object. The band or tube should be slightly tense when your foot is relaxed. 4. Slowly push your foot outward, away from your other leg (eversion). 5. Hold this position for __________ seconds. 6. Slowly return your foot to the starting position. Repeat __________ times. Complete this  exercise __________ times a day. Plantar flexion, standing This exercise is sometimes called standing heel raise. 1. Stand with your feet shoulder-width apart. 2. Place your hands on a wall or table to steady yourself as needed, but try not to use it for support. 3. Keep your weight spread evenly over the width of your feet while you slowly rise up on your toes (plantar flexion). If told by your health care provider: ? Shift your weight toward your left / right leg until you feel challenged. ? Stand on your left / right leg only. 4. Hold this position for __________ seconds. Repeat  __________ times. Complete this exercise __________ times a day. Single leg stand 1. Without shoes, stand near a railing or in a doorway. You may hold on to the railing or door frame as needed. 2. Stand on your left / right foot. Keep your big toe down on the floor and try to keep your arch lifted. ? Do not roll to the outside of your foot. ? If this exercise is too easy, you can try it with your eyes closed or while standing on a pillow. 3. Hold this position for __________ seconds. Repeat __________ times. Complete this exercise __________ times a day. This information is not intended to replace advice given to you by your health care provider. Make sure you discuss any questions you have with your health care provider. Document Revised: 01/03/2019 Document Reviewed: 01/03/2019 Elsevier Patient Education  [place].
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
## 2020-07-17 NOTE — Progress Notes (Signed)
No broken bones visible

## 2020-08-01 ENCOUNTER — Other Ambulatory Visit: Payer: Self-pay

## 2020-08-01 ENCOUNTER — Encounter: Payer: Self-pay | Admitting: Physical Therapy

## 2020-08-01 ENCOUNTER — Ambulatory Visit: Payer: Medicare Other | Attending: Family Medicine | Admitting: Physical Therapy

## 2020-08-01 DIAGNOSIS — R600 Localized edema: Secondary | ICD-10-CM

## 2020-08-01 DIAGNOSIS — R262 Difficulty in walking, not elsewhere classified: Secondary | ICD-10-CM | POA: Diagnosis not present

## 2020-08-01 DIAGNOSIS — M25571 Pain in right ankle and joints of right foot: Secondary | ICD-10-CM

## 2020-08-01 NOTE — Therapy (Signed)
[place], [place], [address] Phone: [phone]   Fax:  [phone]  Physical Therapy Evaluation  Patient Details  Name: Amber Hicks MRN: [id number] Date of Birth: 1951-08-01 Referring Provider (PT): Lynne Leader, MD   Encounter Date: 08/01/2020   PT End of Session - 08/01/20 1057    Visit Number 1    Number of Visits 12    Date for PT Re-Evaluation 09/12/20    Authorization Type [employer] Medicare, progress note at visit 10, recheck FOTO by visit 6    PT Start Time 1015    PT Stop Time 1059    PT Time Calculation (min) 44 min    Activity Tolerance Patient limited by pain    Behavior During Therapy Restless           Past Medical History:  Diagnosis Date  . Depression 01/22/2012  . Hx of adenomatous colonic polyps 01/26/2015  . Hyperlipidemia 01/24/2012  . VITAMIN D DEFICIENCY 07/09/2010   Qualifier: Diagnosis of  By: Jenny Reichmann MD, Hunt Oris     Past Surgical History:  Procedure Laterality Date  . ceaserian section     1 time    There were no vitals filed for this visit.    Subjective Assessment - 08/01/20 1023    Subjective Pt. is a 69 y/o female referred to PT for right ankle pain. MD notes and imaging records report injury around mid-September of this year but pt. reports onset was around May or June of last year-for mechanism of onset she was walking in the yard and got her foot caught in a hole. Ankle pain persisted and has significantly worsened recently/no new specific injury noted. She has been issued a CAM boot which she uses for walking along with either crutches or a RW-see gait under objective. X-rays (-) for fracture but does have significant amount of pain limiting her status for mobility.    Pertinent History depression, history of head injury    Limitations Standing;Walking;House hold activities;Lifting    How long can you stand comfortably? unable comfortably    How long can you walk  comfortably? unable comfortably    Diagnostic tests X-rays    Patient Stated Goals Get ankle better    Currently in Pain? Yes    Pain Score 8     Pain Location Ankle    Pain Orientation Right;Lateral    Pain Descriptors / Indicators Sharp    Pain Type Chronic pain    Pain Onset More than a month ago    Pain Frequency Constant    Aggravating Factors  walking and standing    Pain Relieving Factors better when not standing and walking but pain is constant    Effect of Pain on Daily Activities Limits ability for standing and walking              [facility] PT Assessment - 08/01/20 0001      Assessment   Medical Diagnosis Right ankle pain, pernoneal tendinitis    Referring Provider (PT) Lynne Leader, MD    Onset Date/Surgical Date --   around May or June of 2020 per pt. report-see subjective   Hand Dominance Right    Next MD Visit 08/16/2020    Prior Therapy none      Precautions   Precautions None    Required Braces or Orthoses --   CAM boot right LE     Restrictions   Weight Bearing Restrictions No  WBAT in CAM boot     Balance Screen   Has the patient fallen in the past 6 months No    How many times? --      [place] residence    Living Arrangements Spouse/significant other    Type of [place] to enter    Entrance Stairs-Number of Steps 4    Entrance Stairs-Rails None    Home Layout Two level    Alternate Level Stairs-Number of Steps 15    Alternate Level Stairs-Rails Left    Home Equipment Walker - 2 wheels;Crutches      Prior Function   Level of Independence Independent with basic ADLs;Independent with community mobility without device      Cognition   Overall Cognitive Status Within Functional Limits for tasks assessed      Observation/Other Assessments   Observations pt. tearful intermittently during exam with difficulty tolerating ankle ROM or palpation, shifts positions frequently in sitting     Focus on Therapeutic Outcomes (FOTO)  68% limited      Observation/Other Assessments-Edema    Edema Circumferential      Circumferential Edema   Circumferential - Right 28 cm   mid-malleolus    Circumferential - Left  25 cm   mid-malleolus     Sensation   Light Touch Appears Intact      ROM / Strength   AROM / PROM / Strength AROM;PROM;Strength      AROM   AROM Assessment Site Ankle    Right/Left Ankle Right;Left    Right Ankle Dorsiflexion 2    Right Ankle Plantar Flexion 40    Right Ankle Inversion 40    Right Ankle Eversion 20    Left Ankle Dorsiflexion 5    Left Ankle Plantar Flexion 55    Left Ankle Inversion 44    Left Ankle Eversion 22      PROM   Overall PROM Comments unable to assess due to pain for right ankle    PROM Assessment Site --    Right/Left Ankle --      Strength   Overall Strength Comments right side ankle ROM not tested due to pain    Strength Assessment Site Knee;Ankle    Right/Left Knee Right;Left    Right Knee Flexion 5/5    Right Knee Extension 4+/5    Left Knee Flexion 5/5    Left Knee Extension 5/5    Right/Left Ankle Right;Left    Left Ankle Dorsiflexion 5/5    Left Ankle Plantar Flexion 5/5    Left Ankle Inversion 5/5    Left Ankle Eversion 5/5      Flexibility   Soft Tissue Assessment /Muscle Length --   gastroc and peroneal tightness on right     Palpation   Palpation comment limited tolerance to palpation due to pain-very tender right ATFL and CF region, tender also distal right peroneals      Special Tests   Other special tests unable to assess anterior drawer and talar tilt due to pain      Ambulation/Gait   Gait Comments Pt. ambulates in clinic with CAM boot without her crutches or RW today-limited ability to walk with need to hold clinic rail on ambulation to gym area and needed CGA for safety from entry hallway to tx. mat, significant antlagia noted with limited abilty RLE weightbearing  Objective measurements completed on examination: See above findings.       [facility] Adult PT Treatment/Exercise - 08/01/20 0001      Exercises   Exercises --   HEP handout review                 PT Education - 08/01/20 1256    Education Details symptom etiology, HEP, POC    Person(s) Educated Patient    Methods Explanation;Demonstration;Tactile cues;Verbal cues;Handout    Comprehension Verbalized understanding;Need further instruction            PT Short Term Goals - 08/01/20 1310      PT SHORT TERM GOAL #1   Title Independent with initial HEP    Baseline instructed at eval    Time 2    Period Weeks    Status New    Target Date 08/15/20      PT SHORT TERM GOAL #2   Title Perform balance assessment with BERG vs. TUG within 1-2 weeks of starting therapy pending pt. tolerance to assess for fall risk    Baseline unable to tolerate at eval    Time 2    Period Weeks    Status New    Target Date 08/15/20             PT Long Term Goals - 08/01/20 1312      PT LONG TERM GOAL #1   Title Increase right ankle DF AROM 3-5 deg to improve toe clearance for gait    Baseline 2 deg    Time 6    Period Weeks    Status New    Target Date 09/12/20      PT LONG TERM GOAL #2   Title Improve FOTO outcome measure score to 43% or less limitation due to ankle    Baseline 68% limited    Time 6    Period Weeks    Status New    Target Date 09/12/20      PT LONG TERM GOAL #3   Title Right ankle strength at least grossly 4/5 to improve ankle stability for gait/outdoor ambulation over uneven surfaces    Baseline unable to assess MMTs at eval due to pain    Time 6    Period Weeks    Status New    Target Date 09/12/20      PT LONG TERM GOAL #4   Title Tolerate standing/ambulation for perios at least 20-30 min for chores, cooking, grocery shopping with ankle pain 4/10 or less    Baseline 8/10, limited ability to tolerate prolonged standing ro  ambulation    Time 6    Period Weeks    Status New    Target Date 09/12/20      PT LONG TERM GOAL #5   Title Pt. to be able to perform community level ambulation mod I with ASO brace/LRAD without need for use of crutches or RW    Baseline uses crutches vs. RW, limited tolerance ambulation    Time 6    Period Weeks    Status New    Target Date 09/12/20      Additional Long Term Goals   Additional Long Term Goals Yes      PT LONG TERM GOAL #6   Title Balance goal for Berg vs. TUG to be determined on assessment    Baseline unable to assess at eval  Plan - 08/01/20 1257    Clinical Impression Statement Pt. presents with continued right ankle pain s/p fall injury with eval findings consistent with inversion type lateral ankle sprain along with more recent onset symptoms consistent with referring dx. peroneal tendonitis for which would suspect development associated with compensatory gait pattern/gait difficulties after original ankle sprain. Unable to perform clinical tests for ligamentous stability due to high pain level with limited tolerance for activities during eval today due to pain. Plan trial PT to help ease pain and improve functional status for mobility.    Personal Factors and Comorbidities Time since onset of injury/illness/exacerbation;Transportation;Past/Current Experience;Comorbidity 1    Comorbidities see PMH    Examination-Activity Limitations Stand;Stairs;Locomotion Level;Squat;Lift;Transfers    Examination-Participation Restrictions Community Activity;Shop;Meal Prep;Laundry;Cleaning    Stability/Clinical Decision Making Evolving/Moderate complexity    Clinical Decision Making Moderate    Rehab Potential Good    PT Frequency --   1-2x/week   PT Duration 6 weeks    PT Treatment/Interventions ADLs/Self Care Home Management;Electrical Stimulation;Cryotherapy;Ultrasound;Iontophoresis 4mg /ml Dexamethasone;Moist Heat;Gait training;Stair  training;Functional mobility training;Therapeutic activities;Neuromuscular re-education;Balance training;Therapeutic exercise;Patient/family education;Manual techniques;Dry needling;Taping;Passive range of motion;Vasopneumatic Device    PT Next Visit Plan Review HEP as needed, pending tolerance in the next 1-2 week assess TUG vs. BERG, try gentle seated ankle ROM with BAPS vs. tilt board, progress as tolerated to gentle ankle isometrics and Theraband 4-way, gastroc stretches, gentle manual to peroneals, difficulty tolerating palpation at eval but if pain eases consider gentle joint mobs, proximal open chain strengthening for knee and hip, cryo prn    PT Home Exercise Plan Access code: [email or web address]    Consulted and Agree with Plan of Care Patient           Patient will benefit from skilled therapeutic intervention in order to improve the following deficits and impairments:  Pain, Impaired flexibility, Decreased strength, Decreased activity tolerance, Decreased range of motion, Increased edema, Difficulty walking, Decreased balance, Abnormal gait  Visit Diagnosis: Pain in right ankle and joints of right foot  Localized edema  Difficulty in walking, not elsewhere classified     Problem List Patient Active Problem List   Diagnosis Date Noted  . Right otitis externa 04/19/2020  . Rash 04/19/2020  . External hemorrhoids without complication 67/34/1937  . Left lateral epicondylitis 12/23/2019  . Medial epicondylitis of elbow, left 12/23/2019  . Abdominal pain 08/10/2018  . Right ankle pain 06/16/2018  . Hx of adenomatous colonic polyps 01/26/2015  . 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 Vertigo 08/17/2014  . Hidradenitis 04/05/2013  . Hyperlipidemia 01/24/2012  . Depression 01/22/2012  . Encounter for preventative adult health care exam with abnormal findings 01/15/2012  . Vitamin D deficiency 07/09/2010    Beaulah Dinning, PT, DPT 08/01/20 1:19 PM  [facility] [address] [place], [place], [address] Phone: [phone]   Fax:  [phone]  Name: Amber Hicks MRN: [id number] Date of Birth: [birth year]

## 2020-08-04 ENCOUNTER — Other Ambulatory Visit: Payer: Self-pay | Admitting: Internal Medicine

## 2020-08-04 NOTE — Telephone Encounter (Signed)
Please take OTC Vitamin D3 at 2000 units per day, indefinitely.  

## 2020-08-07 ENCOUNTER — Ambulatory Visit: Payer: Medicare Other

## 2020-08-07 ENCOUNTER — Other Ambulatory Visit: Payer: Self-pay

## 2020-08-07 VITALS — BP 128/74

## 2020-08-07 DIAGNOSIS — M25571 Pain in right ankle and joints of right foot: Secondary | ICD-10-CM | POA: Diagnosis not present

## 2020-08-07 DIAGNOSIS — R262 Difficulty in walking, not elsewhere classified: Secondary | ICD-10-CM

## 2020-08-07 DIAGNOSIS — R600 Localized edema: Secondary | ICD-10-CM | POA: Diagnosis not present

## 2020-08-08 NOTE — Therapy (Addendum)
[facility] [address] [place], [place], [address] Phone: [phone]   Fax:  [phone]  Physical Therapy Treatment/Discharge Summary  Patient Details  Name: Amber Hicks MRN: [id number] Date of Birth: [birth year] Referring Provider (PT): Clementeen Graham, MD   Encounter Date: 08/07/2020   PT End of Session - 08/08/20 0736    Visit Number 2    Number of Visits 12    Date for PT Re-Evaluation 09/12/20    PT Start Time 1017   Pt was late for appt.   PT Stop Time 1047    PT Time Calculation (min) 30 min    Equipment Utilized During Treatment Other (comment)   Cam boot R LE; s a SPC   Activity Tolerance Patient limited by pain    Behavior During Therapy Restless           Past Medical History:  Diagnosis Date  . Depression 01/22/2012  . Hx of adenomatous colonic polyps 01/26/2015  . Hyperlipidemia 01/24/2012  . VITAMIN D DEFICIENCY 07/09/2010   Qualifier: Diagnosis of  By: Jonny Ruiz MD, Len Blalock     Past Surgical History:  Procedure Laterality Date  . ceaserian section     1 time    Vitals:   08/08/20 0731  BP: 128/74     Subjective Assessment - 08/08/20 0731    Subjective Pt continues to experience significant pain of the R ankle.    Pertinent History depression, history of head injury    Limitations Standing;Walking;House hold activities;Lifting    How long can you stand comfortably? unable comfortably    How long can you walk comfortably? unable comfortably    Diagnostic tests X-rays    Patient Stated Goals Get ankle better    Currently in Pain? Yes    Pain Score 8     Pain Location Ankle    Pain Orientation Right;Lateral    Pain Descriptors / Indicators Sharp    Pain Type Chronic pain    Pain Onset More than a month ago    Pain Frequency Constant    Aggravating Factors  walking and standing    Pain Relieving Factors les pain with rest    Effect of Pain on Daily Activities Limits ability for standing and  walking                             [facility] Adult PT Treatment/Exercise - 08/08/20 0001      Exercises   Exercises Ankle      Ankle Exercises: Seated   ABC's --   Attempted, pt exprienced incrd pain and it was discontinued     Ankle Exercises: Supine   Other Supine Ankle Exercises DF/PF 10x; Inv/Ev 10x    Other Supine Ankle Exercises Ankle circles 10x each direction                  PT Education - 08/08/20 0735    Education Details Revised HEP    Person(s) Educated Patient    Methods Explanation;Demonstration;Tactile cues;Verbal cues;Handout    Comprehension Verbalized understanding;Returned demonstration;Verbal cues required;Tactile cues required;Need further instruction            PT Short Term Goals - 08/01/20 1310      PT SHORT TERM GOAL #1   Title Independent with initial HEP    Baseline instructed at eval    Time 2    Period Weeks    Status  New    Target Date 08/15/20      PT SHORT TERM GOAL #2   Title Perform balance assessment with BERG vs. TUG within 1-2 weeks of starting therapy pending pt. tolerance to assess for fall risk    Baseline unable to tolerate at eval    Time 2    Period Weeks    Status New    Target Date 08/15/20             PT Long Term Goals - 08/01/20 1312      PT LONG TERM GOAL #1   Title Increase right ankle DF AROM 3-5 deg to improve toe clearance for gait    Baseline 2 deg    Time 6    Period Weeks    Status New    Target Date 09/12/20      PT LONG TERM GOAL #2   Title Improve FOTO outcome measure score to 43% or less limitation due to ankle    Baseline 68% limited    Time 6    Period Weeks    Status New    Target Date 09/12/20      PT LONG TERM GOAL #3   Title Right ankle strength at least grossly 4/5 to improve ankle stability for gait/outdoor ambulation over uneven surfaces    Baseline unable to assess MMTs at eval due to pain    Time 6    Period Weeks    Status New    Target Date  09/12/20      PT LONG TERM GOAL #4   Title Tolerate standing/ambulation for perios at least 20-30 min for chores, cooking, grocery shopping with ankle pain 4/10 or less    Baseline 8/10, limited ability to tolerate prolonged standing ro ambulation    Time 6    Period Weeks    Status New    Target Date 09/12/20      PT LONG TERM GOAL #5   Title Pt. to be able to perform community level ambulation mod I with ASO brace/LRAD without need for use of crutches or RW    Baseline uses crutches vs. RW, limited tolerance ambulation    Time 6    Period Weeks    Status New    Target Date 09/12/20      Additional Long Term Goals   Additional Long Term Goals Yes      PT LONG TERM GOAL #6   Title Balance goal for Berg vs. TUG to be determined on assessment    Baseline unable to assess at eval                 Plan - 08/08/20 0737    Clinical Impression Statement With standing and walking to enter the gym, the pt experienced light headedness and needed min assist to prevent falling. This PT was able to assist pt to a chair and BP was checked in sitting and standing and found to be appropriate. Pt was advised to stand for 10 sec prior to walking to avoid episodes of light headedness. Pt voiced understanding. Pt reports she has not been completing the HEP due to pain. Reviewed HEP and pt had increased pian c the alphabet ex which was discontinued. Pt has localized swelling of the lateral R ankle. Ankle circles were added and the pt tolerated this ex better. Recommended to pt to complete her exs 1 to2x daily. Pt voiced understanding. Pt did not experince light headness when leaving the  appt.    Personal Factors and Comorbidities Time since onset of injury/illness/exacerbation;Transportation;Past/Current Experience;Comorbidity 1    Comorbidities see PMH    Examination-Activity Limitations Stand;Stairs;Locomotion Level;Squat;Lift;Transfers    Examination-Participation Restrictions Community  Activity;Shop;Meal Prep;Laundry;Cleaning    Stability/Clinical Decision Making Evolving/Moderate complexity    Rehab Potential Good    PT Frequency Other (comment)   1-2x/week   PT Duration 6 weeks    PT Treatment/Interventions ADLs/Self Care Home Management;Electrical Stimulation;Cryotherapy;Ultrasound;Iontophoresis 33m/ml Dexamethasone;Moist Heat;Gait training;Stair training;Functional mobility training;Therapeutic activities;Neuromuscular re-education;Balance training;Therapeutic exercise;Patient/family education;Manual techniques;Dry needling;Taping;Passive range of motion;Vasopneumatic Device    PT Next Visit Plan Review HEP as needed, pending tolerance in the next 1-2 week assess TUG vs. BERG, try gentle seated ankle ROM with BAPS vs. tilt board, progress as tolerated to gentle ankle isometrics and Theraband 4-way, gastroc stretches, gentle manual to peroneals, difficulty tolerating palpation at eval but if pain eases consider gentle joint mobs, proximal open chain strengthening for knee and hip, cryo prn    PT Home Exercise Plan Access code: [email or web address]   Consulted and Agree with Plan of Care Patient           Patient will benefit from skilled therapeutic intervention in order to improve the following deficits and impairments:  Pain, Impaired flexibility, Decreased strength, Decreased activity tolerance, Decreased range of motion, Increased edema, Difficulty walking, Decreased balance, Abnormal gait  Visit Diagnosis: Pain in right ankle and joints of right foot  Localized edema  Difficulty in walking, not elsewhere classified     Problem List Patient Active Problem List   Diagnosis Date Noted  . Right otitis externa 04/19/2020  . Rash 04/19/2020  . External hemorrhoids without complication 046/80/3212 . Left lateral epicondylitis 12/23/2019  . Medial epicondylitis of elbow, left 12/23/2019  . Abdominal pain 08/10/2018  . Right ankle pain 06/16/2018  . Hx of adenomatous  colonic polyps 01/26/2015  . 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 Vertigo 08/17/2014  . Hidradenitis 04/05/2013  . Hyperlipidemia 01/24/2012  . Depression 01/22/2012  . Encounter for preventative adult health care exam with abnormal findings 01/15/2012  . Vitamin D deficiency 07/09/2010    Addendum on 08/26/2020   PHYSICAL THERAPY DISCHARGE SUMMARY  Visits from Start of Care: 2  Current functional level related to goals / functional outcomes: See above   Remaining deficits: See above   Education / Equipment: See above  Plan: Patient agrees to discharge.  Patient goals were not met. Patient is being discharged due to not returning since the last visit.  ?????        Patient has multiple no shows and expressed no longer wanting to continue with skilled PT when contacted because she does not feel like it has helped.   KHaydee Monica PT, DPT 08/26/20 9:56 AM     AGar PontoMS, PT 08/08/20 7:55 AM  [facility] [place] [address]Phone: [phone]  Fax:  [phone] Name: CPHILA SHOAFMRN: [id number] of Birth: 401/11/1950

## 2020-08-15 ENCOUNTER — Ambulatory Visit: Payer: Medicare Other | Admitting: Physical Therapy

## 2020-08-15 NOTE — Progress Notes (Signed)
No Show

## 2020-08-16 ENCOUNTER — Ambulatory Visit: Payer: Medicare Other | Admitting: Family Medicine

## 2020-08-19 ENCOUNTER — Telehealth: Payer: Self-pay

## 2020-08-19 ENCOUNTER — Ambulatory Visit: Payer: Medicare Other

## 2020-08-19 NOTE — Telephone Encounter (Signed)
Patient answered phone and confirmed identity but hung up once PT introduced herself and stated she was calling from [place] regarding "no show" appointment today. PT attempted calling back once more and was forwarded to voicemail immediately.  Haydee Monica, PT, DPT 08/19/20 1:37 PM

## 2020-08-26 ENCOUNTER — Telehealth: Payer: Self-pay

## 2020-08-26 ENCOUNTER — Ambulatory Visit: Payer: Medicare Other

## 2020-08-26 NOTE — Telephone Encounter (Signed)
PT contacted patient regarding multiple "no shows" with patient reporting "I don't think it's helping" and expressing that she no longer wants to continue with physical therapy. Pt has only appeared for 1 treatment visit in addition to initial evaluation. PT informed pt that she can obtain a new referral from doctor and schedule appointments in the future if desired.   Haydee Monica, PT, DPT 08/26/20 9:53 AM

## 2020-08-28 ENCOUNTER — Ambulatory Visit: Payer: Medicare Other | Admitting: Physical Therapy

## 2020-09-04 ENCOUNTER — Encounter: Payer: Medicare Other | Admitting: Physical Therapy

## 2020-09-09 ENCOUNTER — Encounter: Payer: Medicare Other | Admitting: Physical Therapy

## 2020-09-30 ENCOUNTER — Encounter: Payer: Self-pay | Admitting: Family

## 2020-09-30 ENCOUNTER — Other Ambulatory Visit: Payer: Self-pay

## 2020-09-30 ENCOUNTER — Ambulatory Visit (INDEPENDENT_AMBULATORY_CARE_PROVIDER_SITE_OTHER): Payer: Medicare Other | Admitting: Family

## 2020-09-30 VITALS — BP 126/80 | HR 75 | Temp 97.8°F | Ht 65.0 in | Wt 167.0 lb

## 2020-09-30 DIAGNOSIS — M25571 Pain in right ankle and joints of right foot: Secondary | ICD-10-CM | POA: Diagnosis not present

## 2020-09-30 DIAGNOSIS — G8929 Other chronic pain: Secondary | ICD-10-CM | POA: Diagnosis not present

## 2020-09-30 NOTE — Progress Notes (Signed)
Amber Hicks is a 70 y.o. female with the following history as recorded in [employer]:  Patient Active Problem List   Diagnosis Date Noted  . Right otitis externa 04/19/2020  . Rash 04/19/2020  . External hemorrhoids without complication [id number]  . Left lateral epicondylitis 12/23/2019  . Medial epicondylitis of elbow, left 12/23/2019  . Abdominal pain 08/10/2018  . Right ankle pain 06/16/2018  . Hx of adenomatous colonic polyps 01/26/2015  . 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 Vertigo 08/17/2014  . Hidradenitis 04/05/2013  . Hyperlipidemia 01/24/2012  . Depression 01/22/2012  . Encounter for preventative adult health care exam with abnormal findings 01/15/2012  . Vitamin D deficiency 07/09/2010    Current Outpatient Medications  Medication Sig Dispense Refill  . acetaminophen (TYLENOL) 325 MG tablet Take 650 mg by mouth every 6 (six) hours as needed for mild pain.    Marland Kitchen AFLURIA PRESERVATIVE FREE 0.5 ML SUSY Inject 1 Dose as directed once.  0  . alendronate (FOSAMAX) 70 MG tablet TAKE 1 TABLET BY MOUTH EVERY 7 DAYS. TAKE WITH A FULL GLASS OF WATER ON AN EMPTY STOMACH. 12 tablet 1  . atorvastatin (LIPITOR) 10 MG tablet Take 1 tablet (10 mg total) by mouth daily. 90 tablet 3  . diclofenac Sodium (VOLTAREN) 1 % GEL Apply 4 g topically 4 (four) times daily. To affected joint. 100 g 11  . ibuprofen (ADVIL) 600 MG tablet Take 1 tablet (600 mg total) by mouth every 8 (eight) hours as needed for headache. 30 tablet 0  . ketoconazole (NIZORAL) 2 % cream Apply 1 application topically daily as needed for irritation. 60 g 2  . lovastatin (MEVACOR) 40 MG tablet Take 1 tablet (40 mg total) by mouth at bedtime. 90 tablet 3  . meloxicam (MOBIC) 7.5 MG tablet Take 1 twice a day as needed for pain. Take with food. (Do not take with any other NSAID.) 14 tablet 0  . Multiple Vitamin (MULTIVITAMIN) tablet Take 1 tablet by mouth daily.    . SUMAtriptan (IMITREX) 100 MG tablet Take 1 tablet  (100 mg total) by mouth every 2 (two) hours as needed for migraine or headache. May repeat in 2 hours if headache persists or recurs. 10 tablet 5  . traMADol (ULTRAM) 50 MG tablet Take 1 tablet (50 mg total) by mouth every 6 (six) hours as needed. 30 tablet 0  . Vitamin D, Ergocalciferol, (DRISDOL) 1.25 MG (50000 UNIT) CAPS capsule Take 1 capsule (50,000 Units total) by mouth every 7 (seven) days. 12 capsule 0   No current facility-administered medications for this visit.    Allergies: Doxycycline  Past Medical History:  Diagnosis Date  . Depression 01/22/2012  . Hx of adenomatous colonic polyps 01/26/2015  . Hyperlipidemia 01/24/2012  . VITAMIN D DEFICIENCY 07/09/2010   Qualifier: Diagnosis of  By: Jenny Reichmann MD, Hunt Oris     Past Surgical History:  Procedure Laterality Date  . ceaserian section     1 time    Family History  Problem Relation Age of Onset  . Heart disease Mother   . Heart disease Brother   . Colon cancer Neg Hx     Social History   Tobacco Use  . Smoking status: Never Smoker  . Smokeless tobacco: Never Used  Substance Use Topics  . Alcohol use: No    Alcohol/week: 0.0 standard drinks    Subjective:  Chronic right ankle pain; symptoms first started at end of September; has been under care  of sports medicine and PT; notes she did not know phone number to sports medicine and opted to follow-up here instead; Continuing to wear boot for support;    Objective:  Vitals:   09/30/20 0857  BP: 126/80  Pulse: 75  Temp: 97.8 F (36.6 C)  TempSrc: Oral  SpO2: 97%  Weight: 167 lb (75.8 kg)  Height: 5\' 5"  (1.651 m)    General: Well developed, well nourished, in no acute distress  Skin : Warm and dry.  Head: Normocephalic and atraumatic  Lungs: Respirations unlabored; Musculoskeletal: Wearing boot for support;  Neurologic: Alert and oriented; speech intact; face symmetrical; moves all extremities well; CNII-XII intact without focal deficit   Assessment:  1. Chronic  pain of right ankle     Plan:  Consulted with patient's sports medicine provider and he is in agreement that podiatry/ surgical consult is warranted at this time due to poor response to PT; referral is put in place and patient is instructed that she will be contacted to schedule an appointment.  This visit occurred during the SARS-CoV-2 public health emergency.  Safety protocols were in place, including screening questions prior to the visit, additional usage of staff PPE, and extensive cleaning of exam room while observing appropriate contact time as indicated for disinfecting solutions.     No follow-ups on file.  Orders Placed This Encounter  Procedures  . Ambulatory referral to Podiatry    Referral Priority:   Routine    Referral Type:   Consultation    Referral Reason:   Specialty Services Required    Requested Specialty:   Podiatry    Number of Visits Requested:   1    Requested Prescriptions    No prescriptions requested or ordered in this encounter

## 2020-10-04 ENCOUNTER — Ambulatory Visit: Payer: Medicare Other | Admitting: Podiatry

## 2020-10-04 ENCOUNTER — Other Ambulatory Visit: Payer: Self-pay

## 2020-10-04 ENCOUNTER — Ambulatory Visit (INDEPENDENT_AMBULATORY_CARE_PROVIDER_SITE_OTHER): Payer: Medicare Other

## 2020-10-04 DIAGNOSIS — S86311A Strain of muscle(s) and tendon(s) of peroneal muscle group at lower leg level, right leg, initial encounter: Secondary | ICD-10-CM

## 2020-10-04 DIAGNOSIS — M24173 Other articular cartilage disorders, unspecified ankle: Secondary | ICD-10-CM

## 2020-10-04 DIAGNOSIS — M25371 Other instability, right ankle: Secondary | ICD-10-CM

## 2020-10-04 DIAGNOSIS — M79671 Pain in right foot: Secondary | ICD-10-CM

## 2020-10-04 DIAGNOSIS — M249 Joint derangement, unspecified: Secondary | ICD-10-CM

## 2020-10-04 NOTE — Progress Notes (Signed)
  Subjective:  Patient ID: Amber Hicks, female    DOB: May 28, 1951,  MRN: [id number]  Chief Complaint  Patient presents with  . Ankle Pain    Right lateral ankle pain, 2 year duration, no known injuries.    70 y.o. female presents with the above complaint. History confirmed with patient. Patient is tearful during entire encounter due to the pain. States it started when she injured her ankle while mowing her lawn. Has been wearing a boot but does not think it helps. Has tried voltaren and other NSAIDs without relief.  Objective:  Physical Exam: warm, good capillary refill, no trophic changes or ulcerative lesions, normal DP and PT pulses and normal sensory exam. Right Foot: difficult exam 2/2 guarding and severe pain. Negative anterior drawer. Negative talar tilt. POP ATFL, peroneal tendons. Pain on ROM of ankle in DF/PF/Inv/Ev.   No images are attached to the encounter.  Radiographs: X-ray of the right ankle: no fracture, dislocation, swelling or degenerative changes noted Assessment:   1. Tear of peroneal tendon of right foot   2. Ankle instability, right   3. Joint derangement of ankle or foot    Plan:  Patient was evaluated and treated and all questions answered.  Chronic R ankle pain, concern for peroneal tendonitis vs ankle instability.  -XR reviewed. No osseous injury noted. -New CAM boot dispensed. -While she does have physical symptoms of injury I am concerned her pain is out of proportion to an injury with greater than 2 years duration.  Return in about 3 weeks (around 10/25/2020) for MRI review.

## 2020-10-11 ENCOUNTER — Other Ambulatory Visit: Payer: Self-pay | Admitting: Podiatry

## 2020-10-11 DIAGNOSIS — S86311A Strain of muscle(s) and tendon(s) of peroneal muscle group at lower leg level, right leg, initial encounter: Secondary | ICD-10-CM

## 2020-10-13 ENCOUNTER — Other Ambulatory Visit: Payer: Medicare Other

## 2020-10-20 ENCOUNTER — Other Ambulatory Visit: Payer: Self-pay

## 2020-10-20 ENCOUNTER — Ambulatory Visit
Admission: RE | Admit: 2020-10-20 | Discharge: 2020-10-20 | Disposition: A | Discharge status: Home / Self Care | Payer: Medicare Other | Source: Ambulatory Visit | Attending: Podiatry | Admitting: Podiatry

## 2020-10-20 DIAGNOSIS — S86311A Strain of muscle(s) and tendon(s) of peroneal muscle group at lower leg level, right leg, initial encounter: Secondary | ICD-10-CM

## 2020-10-20 DIAGNOSIS — M19071 Primary osteoarthritis, right ankle and foot: Secondary | ICD-10-CM | POA: Diagnosis not present

## 2020-10-20 DIAGNOSIS — M249 Joint derangement, unspecified: Secondary | ICD-10-CM

## 2020-10-20 DIAGNOSIS — M25371 Other instability, right ankle: Secondary | ICD-10-CM

## 2020-10-20 DIAGNOSIS — M24173 Other articular cartilage disorders, unspecified ankle: Secondary | ICD-10-CM

## 2020-10-20 DIAGNOSIS — M12871 Other specific arthropathies, not elsewhere classified, right ankle and foot: Secondary | ICD-10-CM | POA: Diagnosis not present

## 2020-10-20 IMAGING — MR MR ANKLE*R* W/O CM
5 series · 36 of 40 positions shown · non-contrast
Comparison: X-ray [DATE]

CLINICAL DATA: Severe lateral ankle pain for 5 months after fall

EXAM:
MRI OF THE RIGHT ANKLE WITHOUT CONTRAST
TECHNIQUE: Multiplanar, multisequence MR imaging of the ankle was performed. No
intravenous contrast was administered.

[Series 6: T1 · sagittal · 4.0mm · 0.56mm/px · 5 of 18 slices shown]
[im 1/18]
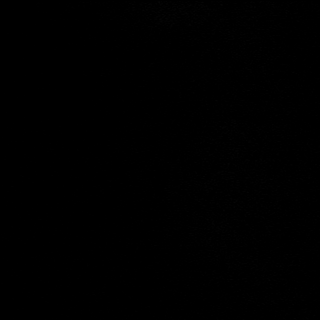
[im 5/18]
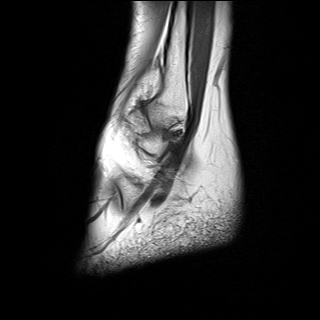
[im 9/18]
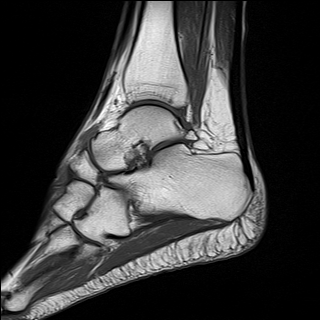
[im 13/18]
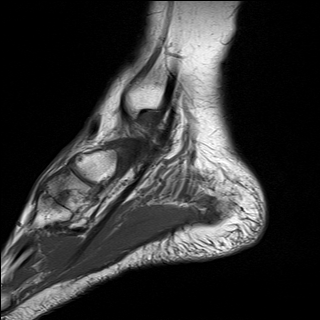
[im 18/18]
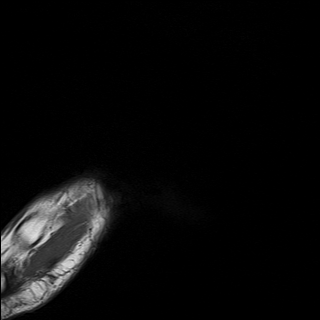

[Series 7: STIR · sagittal · 4.0mm · 0.35mm/px · 5 of 18 slices shown]
[im 1/18]
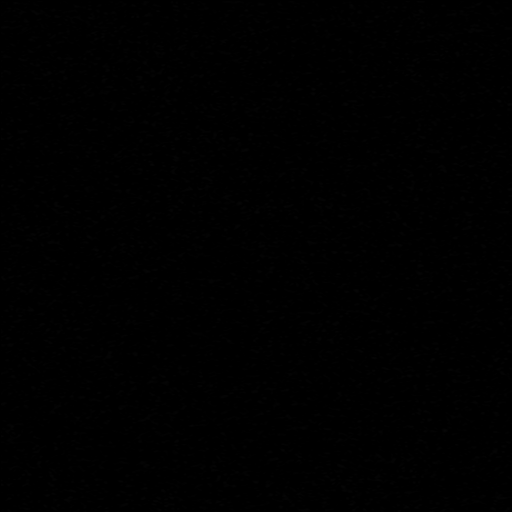
[im 5/18]
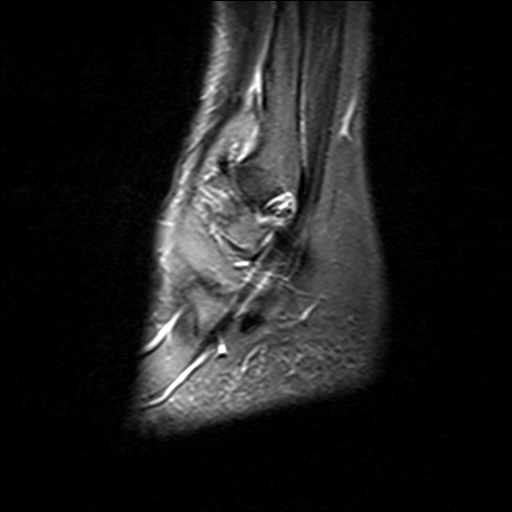
[im 9/18]
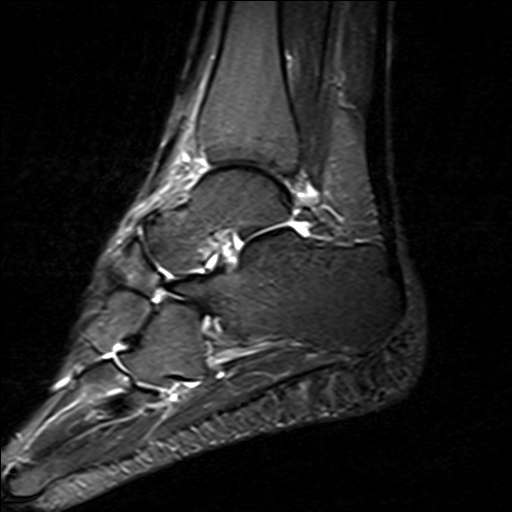
[im 13/18]
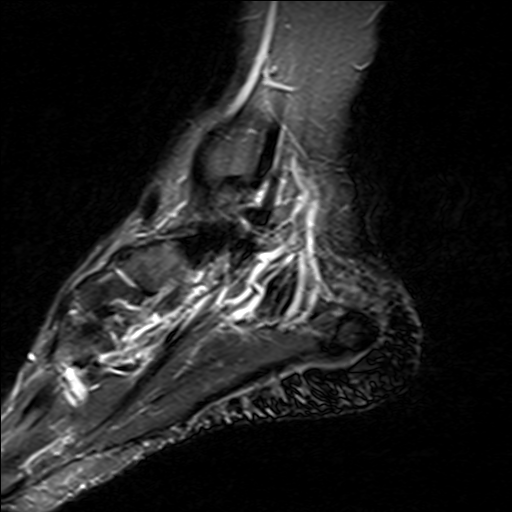
[im 18/18]
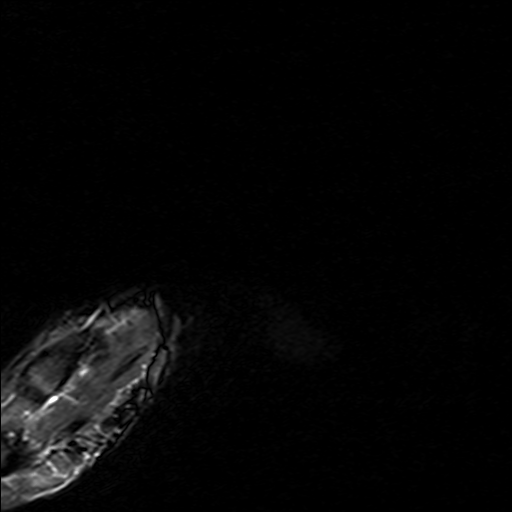

[Series 9: T2 fat-sat · axial · 3.0mm · 0.50mm/px · z∈[-119,+12]mm · 8 of 35 slices shown (1 of 2)]
[im 1/35]
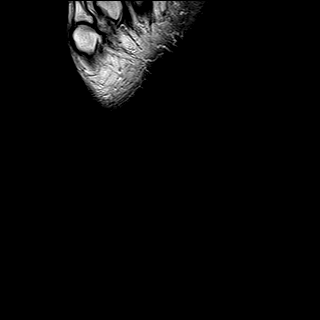
[im 4/35]
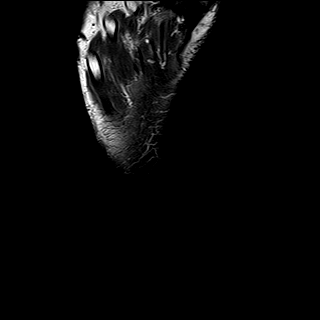
[im 12/35]
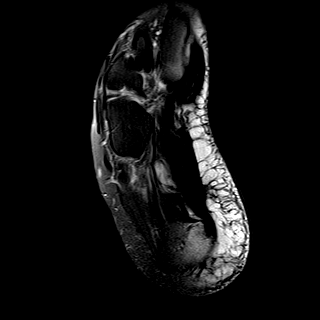
[im 16/35]
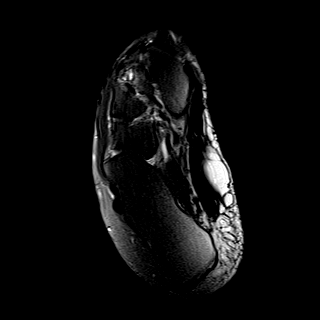
[im 19/35]
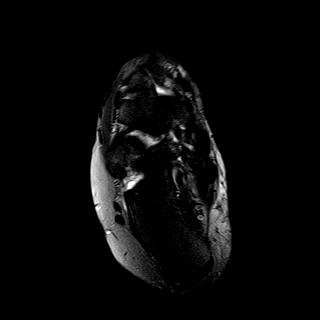
[im 23/35]
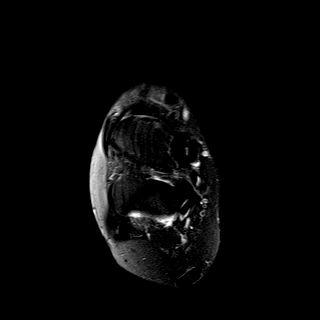
[im 31/35]
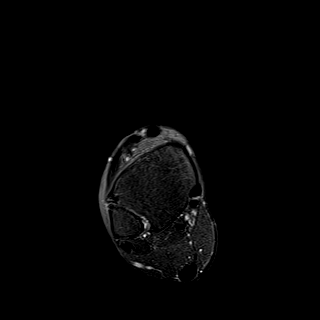
[im 35/35]
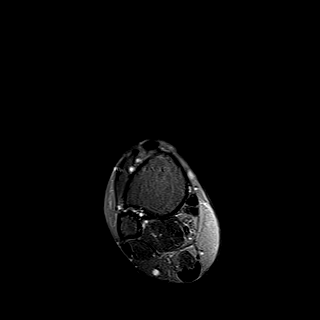

[Series 10: PD fat-sat · axial · 3.0mm · 0.42mm/px · z∈[-119,+12]mm · 10 of 35 slices shown]
[im 1/35]
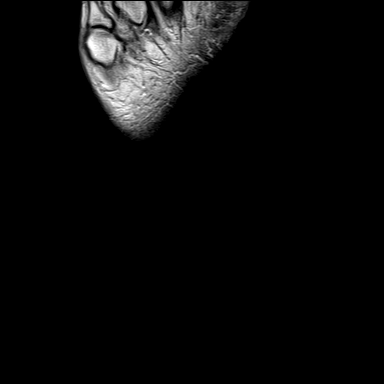
[im 4/35]
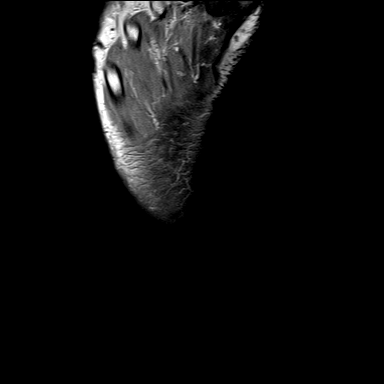
[im 8/35]
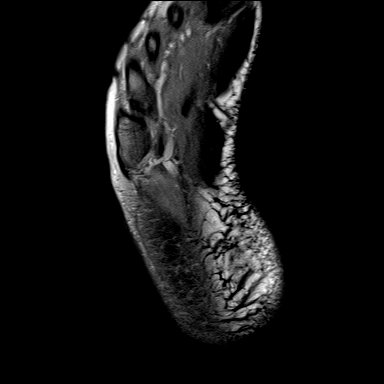
[im 12/35]
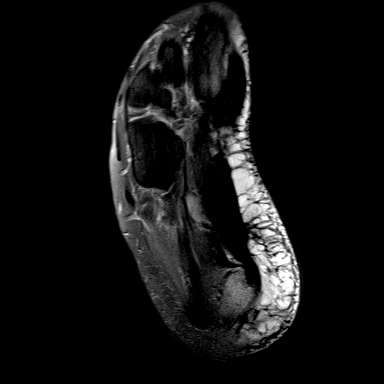
[im 16/35]
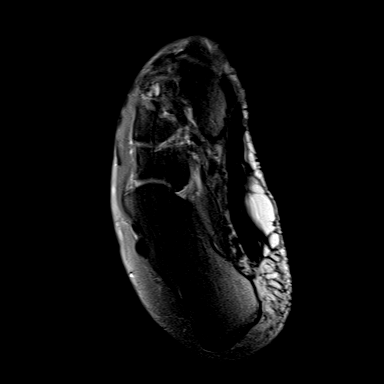
[im 19/35]
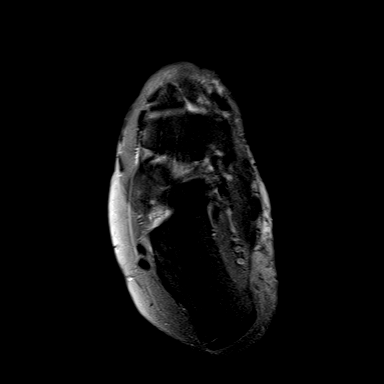
[im 23/35]
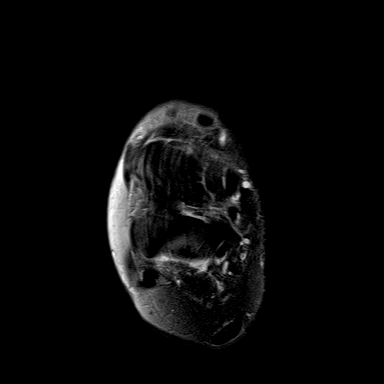
[im 27/35]
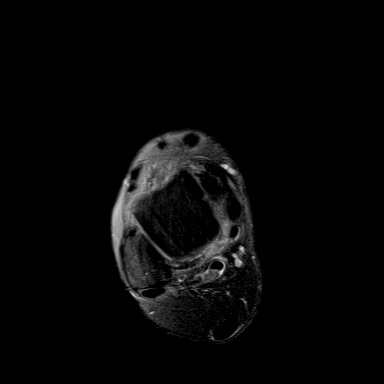
[im 31/35]
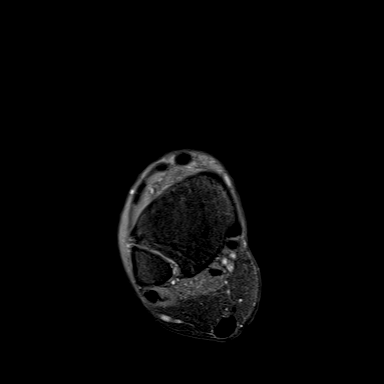
[im 35/35]
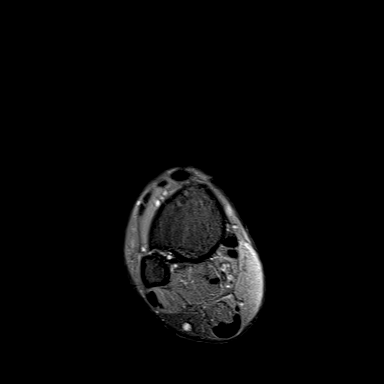

[Series 11: T2 fat-sat · coronal · 3.0mm · 0.50mm/px · 8 of 35 slices shown (2 of 2)]
[im 1/35]
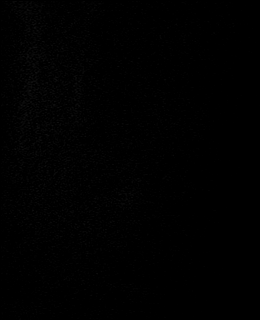
[im 4/35]
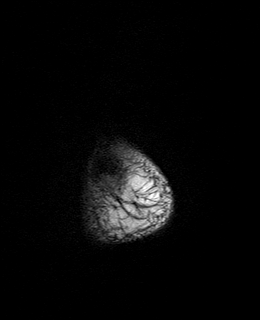
[im 12/35]
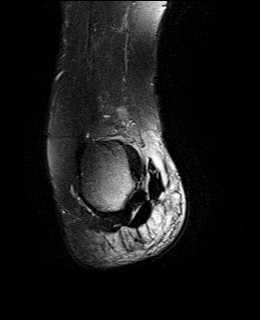
[im 16/35]
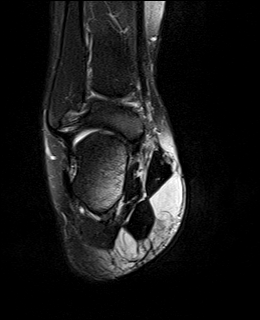
[im 19/35]
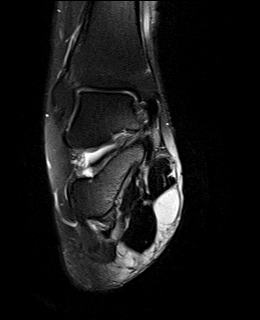
[im 23/35]
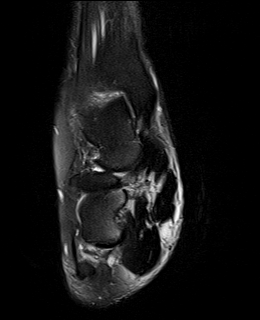
[im 31/35]
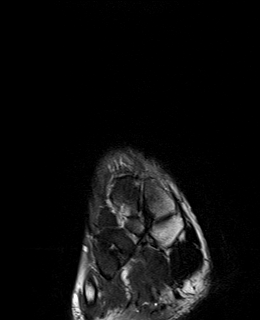
[im 35/35]
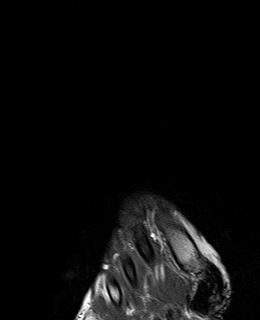

[36 of 40 positions shown; findings below may reference images not displayed]

FINDINGS: TENDONS

Peroneal: Intact peroneus longus and peroneus brevis tendons.

Posteromedial: Intact tibialis posterior, flexor hallucis longus and
flexor digitorum longus tendons. Small volume tenosynovial fluid
within the FHL tendon sheath as it traverses the tibiotalar joint.

Anterior: Intact tibialis anterior, extensor hallucis longus and
extensor digitorum longus tendons.

Achilles: Intact.

Plantar Fascia: Intact.

LIGAMENTS

Lateral: The anterior and posterior tibiofibular ligaments are
intact. The anterior and posterior talofibular ligaments are intact.
Intact calcaneofibular ligament.

Medial: Deltoid ligament and spring ligament complex intact.

CARTILAGE

Ankle Joint: No joint effusion or chondral defect.

Subtalar Joints/Sinus Tarsi: No joint effusion or chondral defect.
Preservation of the anatomic fat within the sinus tarsi.

Bones: No acute fracture. No malalignment. Moderate arthropathy of
the second TMT joint with subchondral cystic changes in the
intermediate cuneiform. Osseous structures otherwise within normal
limits.

Other: No soft tissue edema or fluid collection.
IMPRESSION: 1. No acute abnormality. No findings to explain patient's lateral
ankle pain.
2. Moderate arthropathy of the second TMT joint.
3. Small volume tenosynovial fluid within the FHL tendon sheath.

## 2020-11-01 ENCOUNTER — Ambulatory Visit (INDEPENDENT_AMBULATORY_CARE_PROVIDER_SITE_OTHER): Payer: Medicare Other | Admitting: Podiatry

## 2020-11-01 DIAGNOSIS — Z5329 Procedure and treatment not carried out because of patient's decision for other reasons: Secondary | ICD-10-CM

## 2020-11-01 NOTE — Progress Notes (Signed)
No show for appt. 

## 2020-12-06 ENCOUNTER — Emergency Department (HOSPITAL_COMMUNITY)
Admission: EM | Admit: 2020-12-06 | Discharge: 2020-12-07 | Disposition: A | Discharge status: Home / Self Care | Payer: Medicare Other | Attending: Emergency Medicine | Admitting: Emergency Medicine

## 2020-12-06 ENCOUNTER — Encounter (HOSPITAL_COMMUNITY): Payer: Self-pay | Admitting: Emergency Medicine

## 2020-12-06 ENCOUNTER — Other Ambulatory Visit: Payer: Self-pay

## 2020-12-06 DIAGNOSIS — M79605 Pain in left leg: Secondary | ICD-10-CM | POA: Insufficient documentation

## 2020-12-06 DIAGNOSIS — N12 Tubulo-interstitial nephritis, not specified as acute or chronic: Secondary | ICD-10-CM | POA: Diagnosis not present

## 2020-12-06 DIAGNOSIS — R109 Unspecified abdominal pain: Secondary | ICD-10-CM | POA: Diagnosis not present

## 2020-12-06 LAB — CBC
HCT: 39.9 % (ref 36.0–46.0)
Hemoglobin: 12.7 g/dL (ref 12.0–15.0)
MCH: 30.8 pg (ref 26.0–34.0)
MCHC: 31.8 g/dL (ref 30.0–36.0)
MCV: 96.6 fL (ref 80.0–100.0)
Platelets: 259 10*3/uL (ref 150–400)
RBC: 4.13 MIL/uL (ref 3.87–5.11)
RDW: 13.6 % (ref 11.5–15.5)
WBC: 6.7 10*3/uL (ref 4.0–10.5)
nRBC: 0 % (ref 0.0–0.2)

## 2020-12-06 LAB — COMPREHENSIVE METABOLIC PANEL
ALT: 15 U/L (ref 0–44)
AST: 17 U/L (ref 15–41)
Albumin: 3.6 g/dL (ref 3.5–5.0)
Alkaline Phosphatase: 70 U/L (ref 38–126)
Anion gap: 6 (ref 5–15)
BUN: 13 mg/dL (ref 8–23)
CO2: 26 mmol/L (ref 22–32)
Calcium: 9.3 mg/dL (ref 8.9–10.3)
Chloride: 110 mmol/L (ref 98–111)
Creatinine, Ser: 0.79 mg/dL (ref 0.44–1.00)
GFR, Estimated: >60 mL/min (ref >60)
Glucose, Bld: 99 mg/dL (ref 70–99)
Potassium: 3.5 mmol/L (ref 3.5–5.1)
Sodium: 142 mmol/L (ref 135–145)
Total Bilirubin: 0.2 mg/dL — ABNORMAL LOW (ref 0.3–1.2)
Total Protein: 6.4 g/dL — ABNORMAL LOW (ref 6.5–8.1)

## 2020-12-06 LAB — URINALYSIS, ROUTINE W REFLEX MICROSCOPIC
Bilirubin Urine: NEGATIVE
Glucose, UA: NEGATIVE mg/dL
Ketones, ur: NEGATIVE mg/dL
Nitrite: POSITIVE — AB
Protein, ur: NEGATIVE mg/dL
Specific Gravity, Urine: 1.02 (ref 1.005–1.030)
pH: 5 (ref 5.0–8.0)

## 2020-12-06 LAB — LIPASE, BLOOD: Lipase: 38 U/L (ref 11–51)

## 2020-12-06 NOTE — ED Triage Notes (Signed)
Patient complains of constant sharp flank pain that started a few weeks ago. Denies changes in urination, no diarrhea, no emesis, no nausea. Patient alert, oriented, and in no apparent distress at this time.

## 2020-12-07 DIAGNOSIS — R109 Unspecified abdominal pain: Secondary | ICD-10-CM | POA: Diagnosis not present

## 2020-12-07 MED ORDER — CEPHALEXIN 250 MG PO CAPS
1000.0000 mg | ORAL_CAPSULE | Freq: Once | ORAL | Status: AC
  Administered 2020-12-07: 1000 mg via ORAL
  Filled 2020-12-07: qty 4

## 2020-12-07 MED ORDER — CEPHALEXIN 500 MG PO CAPS: 500.0000 mg | ORAL_CAPSULE | Freq: Four times a day (QID) | ORAL | 0 refills | Status: DC

## 2020-12-07 NOTE — Discharge Instructions (Addendum)
Return for fever worsening pain or inability to eat or drink.  Please follow-up with your family doctor.  Take the antibiotics as prescribed.

## 2020-12-07 NOTE — ED Provider Notes (Signed)
[facility] DEPART[facility] Provider Note   CSN: [id number] Arrival date & time: 12/06/20  1807     History Chief Complaint  Patient presents with  . Flank Pain    Amber Hicks is a 70 y.o. female.  70 yo F with a chief complaints of left leg pain.  This been going on for the past for 5 days.  Associated with dysuria increased frequency.  No fevers no nausea or vomiting.  No history of UTI in the past.  No history of kidney stones.  Pain is been persistent.  The history is provided by the patient.  Flank Pain This is a new problem. The current episode started more than 2 days ago. The problem occurs constantly. The problem has not changed since onset.Pertinent negatives include no chest pain, no headaches and no shortness of breath. Nothing aggravates the symptoms. Nothing relieves the symptoms. She has tried nothing for the symptoms. The treatment provided no relief.       Past Medical History:  Diagnosis Date  . Depression 01/22/2012  . Hx of adenomatous colonic polyps 01/26/2015  . Hyperlipidemia 01/24/2012  . VITAMIN D DEFICIENCY 07/09/2010   Qualifier: Diagnosis of  By: Jenny Reichmann MD, Hunt Oris     Patient Active Problem List   Diagnosis Date Noted  . Right otitis externa 04/19/2020  . Rash 04/19/2020  . External hemorrhoids without complication 76/54/6503  . Left lateral epicondylitis 12/23/2019  . Medial epicondylitis of elbow, left 12/23/2019  . Abdominal pain 08/10/2018  . Right ankle pain 06/16/2018  . Hx of adenomatous colonic polyps 01/26/2015  . 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 Vertigo 08/17/2014  . Hidradenitis 04/05/2013  . Hyperlipidemia 01/24/2012  . Depression 01/22/2012  . Encounter for preventative adult health care exam with abnormal findings 01/15/2012  . Vitamin D deficiency 07/09/2010    Past Surgical History:  Procedure Laterality Date  . ceaserian section     1 time     OB History   No obstetric  history on file.     Family History  Problem Relation Age of Onset  . Heart disease Mother   . Heart disease Brother   . Colon cancer Neg Hx     Social History   Tobacco Use  . Smoking status: Never Smoker  . Smokeless tobacco: Never Used  Substance Use Topics  . Alcohol use: No    Alcohol/week: 0.0 standard drinks  . Drug use: No    Home Medications Prior to Admission medications   Medication Sig Start Date End Date Taking? Authorizing Provider  cephALEXin (KEFLEX) 500 MG capsule Take 1 capsule (500 mg total) by mouth 4 (four) times daily. 12/07/20  Yes Deno Etienne, DO  acetaminophen (TYLENOL) 325 MG tablet Take 650 mg by mouth every 6 (six) hours as needed for mild pain.    [provider]  AFLURIA PRESERVATIVE FREE 0.5 ML SUSY Inject 1 Dose as directed once. 06/25/14   [provider]  alendronate (FOSAMAX) 70 MG tablet TAKE 1 TABLET BY MOUTH EVERY 7 DAYS. TAKE WITH A FULL GLASS OF WATER ON AN EMPTY STOMACH. 06/17/20   Biagio Borg, MD  atorvastatin (LIPITOR) 10 MG tablet Take 1 tablet (10 mg total) by mouth daily. 04/24/20   Biagio Borg, MD  diclofenac Sodium (VOLTAREN) 1 % GEL Apply 4 g topically 4 (four) times daily. To affected joint. 07/16/20   Gregor Hams, MD  FLUAD QUADRIVALENT 0.5 ML  injection  05/25/20   [provider]  ibuprofen (ADVIL) 600 MG tablet Take 1 tablet (600 mg total) by mouth every 8 (eight) hours as needed for headache. 04/14/19   Lyndal Pulley, DO  ketoconazole (NIZORAL) 2 % cream Apply 1 application topically daily as needed for irritation. 04/19/20   Biagio Borg, MD  lovastatin (MEVACOR) 40 MG tablet Take 1 tablet (40 mg total) by mouth at bedtime. 06/16/18   Biagio Borg, MD  meloxicam (MOBIC) 7.5 MG tablet Take 1 twice a day as needed for pain. Take with food. (Do not take with any other NSAID.) 05/22/20   Jacqulyn Cane, MD  Multiple Vitamin (MULTIVITAMIN) tablet Take 1 tablet by mouth daily.    [provider]   neomycin-polymyxin-hydrocortisone (CORTISPORIN) 3.5-10000-1 OTIC suspension  06/02/20   [provider]  SUMAtriptan (IMITREX) 100 MG tablet Take 1 tablet (100 mg total) by mouth every 2 (two) hours as needed for migraine or headache. May repeat in 2 hours if headache persists or recurs. 06/16/18   Biagio Borg, MD  traMADol (ULTRAM) 50 MG tablet Take 1 tablet (50 mg total) by mouth every 6 (six) hours as needed. 12/22/19   Biagio Borg, MD  Vitamin D, Ergocalciferol, (DRISDOL) 1.25 MG (50000 UNIT) CAPS capsule Take 1 capsule (50,000 Units total) by mouth every 7 (seven) days. 04/24/20   Biagio Borg, MD    Allergies    Doxycycline  Review of Systems   Review of Systems  Constitutional: Negative for chills and fever.  HENT: Negative for congestion and rhinorrhea.   Eyes: Negative for redness and visual disturbance.  Respiratory: Negative for shortness of breath and wheezing.   Cardiovascular: Negative for chest pain and palpitations.  Gastrointestinal: Negative for nausea and vomiting.  Genitourinary: Positive for dysuria, flank pain and frequency. Negative for urgency.  Musculoskeletal: Negative for arthralgias and myalgias.  Skin: Negative for pallor and wound.  Neurological: Negative for dizziness and headaches.    Physical Exam Updated Vital Signs BP (!) 146/71 (BP Location: Right Wrist)   Pulse (!) 58   Temp 98.2 F (36.8 C)   Resp 15   SpO2 92%   Physical Exam Vitals and nursing note reviewed.  Constitutional:      General: She is not in acute distress.    Appearance: She is well-developed. She is not diaphoretic.  HENT:     Head: Normocephalic and atraumatic.  Eyes:     Pupils: Pupils are equal, round, and reactive to light.  Cardiovascular:     Rate and Rhythm: Normal rate and regular rhythm.     Heart sounds: No murmur heard. No friction rub. No gallop.   Pulmonary:     Effort: Pulmonary effort is normal.     Breath sounds: No wheezing or rales.   Abdominal:     General: There is no distension.     Palpations: Abdomen is soft.     Tenderness: There is no abdominal tenderness. There is left CVA tenderness.  Musculoskeletal:        General: No tenderness.     Cervical back: Normal range of motion and neck supple.  Skin:    General: Skin is warm and dry.  Neurological:     Mental Status: She is alert and oriented to person, place, and time.  Psychiatric:        Behavior: Behavior normal.     ED Results / Procedures / Treatments   Labs (all labs  ordered are listed, but only abnormal results are displayed) Labs Reviewed  COMPREHENSIVE METABOLIC PANEL - Abnormal; Notable for the following components:      Result Value   Total Protein 6.4 (*)    Total Bilirubin 0.2 (*)    All other components within normal limits  URINALYSIS, ROUTINE W REFLEX MICROSCOPIC - Abnormal; Notable for the following components:   Hgb urine dipstick SMALL (*)    Nitrite POSITIVE (*)    Leukocytes,Ua MODERATE (*)    Bacteria, UA FEW (*)    All other components within normal limits  LIPASE, BLOOD  CBC    EKG None  Radiology No results found.  Procedures Procedures   Medications Ordered in ED Medications  cephALEXin (KEFLEX) capsule 1,000 mg (has no administration in time range)    ED Course  I have reviewed the triage vital signs and the nursing notes.  Pertinent labs & imaging results that were available during my care of the patient were reviewed by me and considered in my medical decision making (see chart for details).    MDM Rules/Calculators/A&P                          70 yo F with a chief complaints of left flank pain.  Going on for about 4 to 5 days.  Associated with dysuria.  Patient urine likely infected with positive leukocyte estrace positive nitrates and bacteria.  Well-appearing nontoxic.  Afebrile here.  Tolerating p.o. without issue.  Will start on antibiotics.  PCP follow-up.  12:56 AM:  I have discussed the  diagnosis/risks/treatment options with the patient and family and believe the pt to be eligible for discharge home to follow-up with PCP. We also discussed returning to the ED immediately if new or worsening sx occur. We discussed the sx which are most concerning (e.g., sudden worsening pain, fever, inability to tolerate by mouth) that necessitate immediate return. Medications administered to the patient during their visit and any new prescriptions provided to the patient are listed below.  Medications given during this visit Medications  cephALEXin (KEFLEX) capsule 1,000 mg (has no administration in time range)     The patient appears reasonably screen and/or stabilized for discharge and I doubt any other medical condition or other [facility] requiring further screening, evaluation, or treatment in the ED at this time prior to discharge.   Final Clinical Impression(s) / ED Diagnoses Final diagnoses:  Pyelonephritis    Rx / DC Orders ED Discharge Orders         Ordered    cephALEXin (KEFLEX) 500 MG capsule  4 times daily        12/07/20 [place], [place], DO 12/07/20 [phone]

## 2020-12-10 ENCOUNTER — Encounter: Payer: Self-pay | Admitting: Internal Medicine

## 2020-12-10 ENCOUNTER — Ambulatory Visit (INDEPENDENT_AMBULATORY_CARE_PROVIDER_SITE_OTHER): Payer: Medicare Other | Admitting: Internal Medicine

## 2020-12-10 ENCOUNTER — Other Ambulatory Visit: Payer: Self-pay

## 2020-12-10 VITALS — BP 114/62 | HR 87 | Temp 98.2°F | Ht 65.0 in | Wt 166.0 lb

## 2020-12-10 DIAGNOSIS — I700 Atherosclerosis of aorta: Secondary | ICD-10-CM | POA: Diagnosis not present

## 2020-12-10 DIAGNOSIS — E559 Vitamin D deficiency, unspecified: Secondary | ICD-10-CM

## 2020-12-10 DIAGNOSIS — K219 Gastro-esophageal reflux disease without esophagitis: Secondary | ICD-10-CM

## 2020-12-10 DIAGNOSIS — M810 Age-related osteoporosis without current pathological fracture: Secondary | ICD-10-CM

## 2020-12-10 DIAGNOSIS — E7800 Pure hypercholesterolemia, unspecified: Secondary | ICD-10-CM

## 2020-12-10 HISTORY — DX: Gastro-esophageal reflux disease without esophagitis: K21.9

## 2020-12-10 MED ORDER — PANTOPRAZOLE SODIUM 40 MG PO TBEC: 40.0000 mg | DELAYED_RELEASE_TABLET | Freq: Every day | ORAL | 3 refills | Status: DC

## 2020-12-10 NOTE — Patient Instructions (Signed)
.  Please take all new medication as prescribed  - the protonix  OK to stop the alendronate for bones since you have taken more than 5 years  Please continue all other medications as before, and refills have been done if requested.  Please have the pharmacy call with any other refills you may need.  Please continue your efforts at being more active, low cholesterol diet, and weight control.  Please keep your appointments with your specialists as you may have planned  Please make an Appointment to return in 3 months, or sooner if needed

## 2020-12-10 NOTE — Assessment & Plan Note (Signed)
Last vitamin D Lab Results  Component Value Date   VD25OH 19 (L) 04/23/2020   Low, to start oral replacement

## 2020-12-10 NOTE — Assessment & Plan Note (Signed)
Ok to d/c alendronate, and will plan to f/u dxa at some point, declines today

## 2020-12-10 NOTE — Progress Notes (Signed)
Patient ID: Amber Hicks, female   DOB: 1950-11-06, 70 y.o.   MRN: [id number]        Chief Complaint: follow up HTN, HLD and hyperglycemia, gerd, hld, vit d dev, and aortic atherosclerosis       HPI:  Amber Hicks is a 70 y.o. female here with c/o 1 wk worsening reflux symptoms despite trying to adjust her diet, only briefly better with tums prn.  Pt denies other chest pain, increased sob or doe, wheezing, orthopnea, PND, increased LE swelling, palpitations, dizziness or syncope. Denies worsening focal neuro s/s.   Pt denies polydipsia, polyuria, Has been on alendronate > 5 yrs.   Pt denies fever, wt loss, night sweats, loss of appetite, or other constitutional symptoms  Trying to follow lower chol diet.  Not taking vit d       Wt Readings from Last 3 Encounters:  12/10/20 166 lb (75.3 kg)  09/30/20 167 lb (75.8 kg)  06/25/20 162 lb (73.5 kg)   BP Readings from Last 3 Encounters:  12/10/20 114/62  12/07/20 (!) 144/78  09/30/20 126/80         Past Medical History:  Diagnosis Date  . Depression 01/22/2012  . Hx of adenomatous colonic polyps 01/26/2015  . Hyperlipidemia 01/24/2012  . VITAMIN D DEFICIENCY 07/09/2010   Qualifier: Diagnosis of  By: Jenny Reichmann MD, Hunt Oris    Past Surgical History:  Procedure Laterality Date  . ceaserian section     1 time    reports that she has never smoked. She has never used smokeless tobacco. She reports that she does not drink alcohol and does not use drugs. family history includes Heart disease in her brother and mother. Allergies  Allergen Reactions  . Doxycycline Rash   Current Outpatient Medications on File Prior to Visit  Medication Sig Dispense Refill  . acetaminophen (TYLENOL) 325 MG tablet Take 650 mg by mouth every 6 (six) hours as needed for mild pain.    Marland Kitchen AFLURIA PRESERVATIVE FREE 0.5 ML SUSY Inject 1 Dose as directed once.  0  . atorvastatin (LIPITOR) 10 MG tablet Take 1 tablet (10 mg total) by mouth daily. 90 tablet 3  . diclofenac  Sodium (VOLTAREN) 1 % GEL Apply 4 g topically 4 (four) times daily. To affected joint. 100 g 11  . ibuprofen (ADVIL) 600 MG tablet Take 1 tablet (600 mg total) by mouth every 8 (eight) hours as needed for headache. 30 tablet 0  . ketoconazole (NIZORAL) 2 % cream Apply 1 application topically daily as needed for irritation. 60 g 2  . Multiple Vitamin (MULTIVITAMIN) tablet Take 1 tablet by mouth daily.    Marland Kitchen neomycin-polymyxin-hydrocortisone (CORTISPORIN) 3.5-10000-1 OTIC suspension     . SUMAtriptan (IMITREX) 100 MG tablet Take 1 tablet (100 mg total) by mouth every 2 (two) hours as needed for migraine or headache. May repeat in 2 hours if headache persists or recurs. 10 tablet 5  . traMADol (ULTRAM) 50 MG tablet Take 1 tablet (50 mg total) by mouth every 6 (six) hours as needed. 30 tablet 0  . cephALEXin (KEFLEX) 500 MG capsule Take 1 capsule (500 mg total) by mouth 4 (four) times daily. (Patient not taking: Reported on 12/10/2020) 40 capsule 0  . meloxicam (MOBIC) 7.5 MG tablet Take 1 twice a day as needed for pain. Take with food. (Do not take with any other NSAID.) (Patient not taking: Reported on 12/10/2020) 14 tablet 0  . Vitamin D, Ergocalciferol, (DRISDOL) 1.25 MG (  50000 UNIT) CAPS capsule Take 1 capsule (50,000 Units total) by mouth every 7 (seven) days. (Patient not taking: Reported on 12/10/2020) 12 capsule 0   No current facility-administered medications on file prior to visit.        ROS:  All others reviewed and negative.  Objective        PE:  BP 114/62   Pulse 87   Temp 98.2 F (36.8 C) (Oral)   Ht 5\' 5"  (1.651 m)   Wt 166 lb (75.3 kg)   SpO2 96%   BMI 27.62 kg/m                 Constitutional: Pt appears in NAD               HENT: Head: NCAT.                Right Ear: External ear normal.                 Left Ear: External ear normal.                Eyes: . Pupils are equal, round, and reactive to light. Conjunctivae and EOM are normal               Nose: without d/c or  deformity               Neck: Neck supple. Gross normal ROM               Cardiovascular: Normal rate and regular rhythm.                 Pulmonary/Chest: Effort normal and breath sounds without rales or wheezing.                Abd:  Soft, NT, ND, + BS, no organomegaly               Neurological: Pt is alert. At baseline orientation, motor grossly intact               Skin: Skin is warm. No rashes, no other new lesions, LE edema - none               Psychiatric: Pt behavior is normal without agitation   Micro: none  Cardiac tracings I have personally interpreted today:  none  Pertinent Radiological findings (summarize): none   Lab Results  Component Value Date   WBC 6.7 12/06/2020   HGB 12.7 12/06/2020   HCT 39.9 12/06/2020   PLT 259 12/06/2020   GLUCOSE 99 12/06/2020   CHOL 215 (H) 04/23/2020   TRIG 94 04/23/2020   HDL 42 (L) 04/23/2020   LDLDIRECT 163.0 01/22/2012   LDLCALC 153 (H) 04/23/2020   ALT 15 12/06/2020   AST 17 12/06/2020   NA 142 12/06/2020   K 3.5 12/06/2020   CL 110 12/06/2020   CREATININE 0.79 12/06/2020   BUN 13 12/06/2020   CO2 26 12/06/2020   TSH 2.07 04/23/2020   Assessment/Plan:  Amber Hicks is a 70 y.o. White or Caucasian [1] female with  has a past medical history of Depression (01/22/2012), adenomatous colonic polyps (01/26/2015), Hyperlipidemia (01/24/2012), and VITAMIN D DEFICIENCY (07/09/2010).  Vitamin D deficiency Last vitamin D Lab Results  Component Value Date   VD25OH 54 (L) 04/23/2020   Low, to start oral replacement  Hyperlipidemia Lab Results  Component Value Date   LDLCALC 153 (H) 04/23/2020   Uncontrolled at last lab, pt  to continue current statin lipitor 10, declines lab f/u today   Aortic atherosclerosis ([place]) To continue lipitor 10 mg and low chol diet  Osteoporosis Ok to d/c alendronate, and will plan to f/u dxa at some point, declines today  GERD (gastroesophageal reflux disease) With worsening symptoms, for  protonix 40 qd  Followup: Return in about 3 months (around 03/12/2021).  Cathlean Cower, MD 12/10/2020 9:19 PM [facility]

## 2020-12-10 NOTE — Assessment & Plan Note (Addendum)
Lab Results  Component Value Date   LDLCALC 153 (H) 04/23/2020   Uncontrolled at last lab, pt to continue current statin lipitor 10, declines lab f/u today

## 2020-12-10 NOTE — Assessment & Plan Note (Signed)
To continue lipitor 10 mg and low chol diet

## 2020-12-10 NOTE — Assessment & Plan Note (Signed)
With worsening symptoms, for protonix 40 qd

## 2020-12-11 NOTE — Progress Notes (Signed)
Amber Hicks, am serving as a Education administrator for Dr. Lynne Leader.  Amber Hicks is a 70 y.o. female who presents to [place] at [facility] today for f/u of R ankle pain due to a R ankle sprain.  She was last seen by Dr. Georgina Snell on 07/16/20 and was wearing a boot and using crutches, reporting no change in her symptoms.  She was referred to PT and completed 2 visits as an attempt at home health PT did not go well.  She was advised to follow-up in one month but did not attend.  She has since been seen by podiatry on 10/04/20 and an MRI was ordered at that time. Since her last visit, pt reports still wearing the boot and states nothing has changed. Patient is still in pain, ankle is swollen, and hard to walk on.   Diagnostic imaging: R ankle MRI- 10/20/20; R ankle XR- 10/04/20, 07/16/20, 06/25/20  Pertinent review of systems: no fever or chills  Relevant historical information: Atherosclerosis.  History of head injury.   Exam:  BP 122/70 (BP Location: Left Arm, Patient Position: Sitting, Cuff Size: Normal)   Pulse 78   Ht 5\' 5"  (1.651 m)   SpO2 98%   BMI 27.62 kg/m  General: Well Developed, well nourished, and in no acute distress.   MSK: Right ankle swollen laterally.  Decreased ankle motion.  Tender palpation lateral ankle.  Nontender medial ankle.  Antalgic gait.    Lab and Radiology Results  EXAM: MRI OF THE RIGHT ANKLE WITHOUT CONTRAST  TECHNIQUE: Multiplanar, multisequence MR imaging of the ankle was performed. No intravenous contrast was administered.  COMPARISON:  X-ray 10/04/2020  FINDINGS: TENDONS  Peroneal: Intact peroneus longus and peroneus brevis tendons.  Posteromedial: Intact tibialis posterior, flexor hallucis longus and flexor digitorum longus tendons. Small volume tenosynovial fluid within the FHL tendon sheath as it traverses the tibiotalar joint.  Anterior: Intact tibialis anterior, extensor hallucis longus and extensor digitorum  longus tendons.  Achilles: Intact.  Plantar Fascia: Intact.  LIGAMENTS  Lateral: The anterior and posterior tibiofibular ligaments are intact. The anterior and posterior talofibular ligaments are intact. Intact calcaneofibular ligament.  Medial: Deltoid ligament and spring ligament complex intact.  CARTILAGE  Ankle Joint: No joint effusion or chondral defect.  Subtalar Joints/Sinus Tarsi: No joint effusion or chondral defect. Preservation of the anatomic fat within the sinus tarsi.  Bones: No acute fracture. No malalignment. 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 Osseous structures otherwise within normal limits.  Other: No soft tissue edema or fluid collection.  IMPRESSION: 1. No acute abnormality. No findings to explain patient's lateral ankle pain. 2. 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 3. Small volume tenosynovial fluid within the FHL tendon sheath.   Electronically Signed   By: Davina Poke D.O.   On: 10/20/2020 15:10  I, Lynne Leader, personally (independently) visualized and performed the interpretation of the images attached in this note.    Assessment and Plan: 70 y.o. female with lateral ankle pain.  Patient suffered an inversion injury and ankle sprain half a year ago at this time.  She was lost to follow-up after October for my care.  At that time she was wearing a cam walker boot.  She got some care with a podiatrist and January where her x-ray and MRI was done.  MRI showed no significant acute abnormalities lateral ankle where she has the swelling and pain.  Fundamentally  I think she suffered an ankle sprain which is now healed but has a lot of dysfunction because she has been wearing a boot for 6 months.  At this point I think it is essential that she get rid of the Cam walker boot and start physical therapy.  She now has buy-in from her husband so I think that  is more feasible than what we attempted last time.   Recommend using an ASO ankle brace as needed along with physical therapy.  Recheck in 1 month.   PDMP not reviewed this encounter. Orders Placed This Encounter  Procedures  . Ambulatory referral to Physical Therapy    Referral Priority:   Routine    Referral Type:   Physical Medicine    Referral Reason:   Specialty Services Required    Requested Specialty:   Physical Therapy   No orders of the defined types were placed in this encounter.    Discussed warning signs or symptoms. Please see discharge instructions. Patient expresses understanding.   The above documentation has been reviewed and is accurate and complete Lynne Leader, M.D.

## 2020-12-12 ENCOUNTER — Ambulatory Visit (INDEPENDENT_AMBULATORY_CARE_PROVIDER_SITE_OTHER): Payer: Medicare Other | Admitting: Family Medicine

## 2020-12-12 ENCOUNTER — Other Ambulatory Visit: Payer: Self-pay

## 2020-12-12 ENCOUNTER — Encounter: Payer: Self-pay | Admitting: Family Medicine

## 2020-12-12 VITALS — BP 122/70 | HR 78 | Ht 65.0 in

## 2020-12-12 DIAGNOSIS — M25571 Pain in right ankle and joints of right foot: Secondary | ICD-10-CM | POA: Diagnosis not present

## 2020-12-12 DIAGNOSIS — G8929 Other chronic pain: Secondary | ICD-10-CM | POA: Diagnosis not present

## 2020-12-12 NOTE — Patient Instructions (Signed)
Thank you for coming in today.  I've referred you to Physical Therapy.  Let us know if you don't hear from them in one week.  Please go to [facility] supply to get the ASO ankle brace (lace up ankle brace) we talked about today. You may also be able to get it from [employer].   Recheck with me in 1 month.

## 2020-12-17 ENCOUNTER — Other Ambulatory Visit: Payer: Self-pay | Admitting: Internal Medicine

## 2020-12-30 ENCOUNTER — Ambulatory Visit: Payer: Medicare Other | Admitting: Physical Therapy

## 2021-01-02 ENCOUNTER — Ambulatory Visit: Payer: Medicare Other | Admitting: Rehabilitative and Restorative Service Providers"

## 2021-01-13 ENCOUNTER — Other Ambulatory Visit: Payer: Self-pay

## 2021-01-13 ENCOUNTER — Encounter: Payer: Self-pay | Admitting: Family Medicine

## 2021-01-13 ENCOUNTER — Ambulatory Visit: Payer: Self-pay

## 2021-01-13 ENCOUNTER — Ambulatory Visit (INDEPENDENT_AMBULATORY_CARE_PROVIDER_SITE_OTHER): Payer: Medicare Other | Admitting: Family Medicine

## 2021-01-13 ENCOUNTER — Ambulatory Visit (INDEPENDENT_AMBULATORY_CARE_PROVIDER_SITE_OTHER): Payer: Medicare Other

## 2021-01-13 VITALS — BP 144/78 | HR 60 | Ht 65.0 in | Wt 165.2 lb

## 2021-01-13 DIAGNOSIS — M25522 Pain in left elbow: Secondary | ICD-10-CM

## 2021-01-13 DIAGNOSIS — M6520 Calcific tendinitis, unspecified site: Secondary | ICD-10-CM

## 2021-01-13 DIAGNOSIS — G8929 Other chronic pain: Secondary | ICD-10-CM | POA: Diagnosis not present

## 2021-01-13 DIAGNOSIS — M7712 Lateral epicondylitis, left elbow: Secondary | ICD-10-CM

## 2021-01-13 DIAGNOSIS — M25571 Pain in right ankle and joints of right foot: Secondary | ICD-10-CM | POA: Diagnosis not present

## 2021-01-13 IMAGING — DX DG ELBOW COMPLETE 3+V*L*
4 series · 4 of 4 positions shown · non-contrast
Comparison: None.

CLINICAL DATA: Left elbow pain for 2 weeks, no known injury,
initial encounter

EXAM:
LEFT ELBOW - COMPLETE 3+ VIEW

[elbow ap]
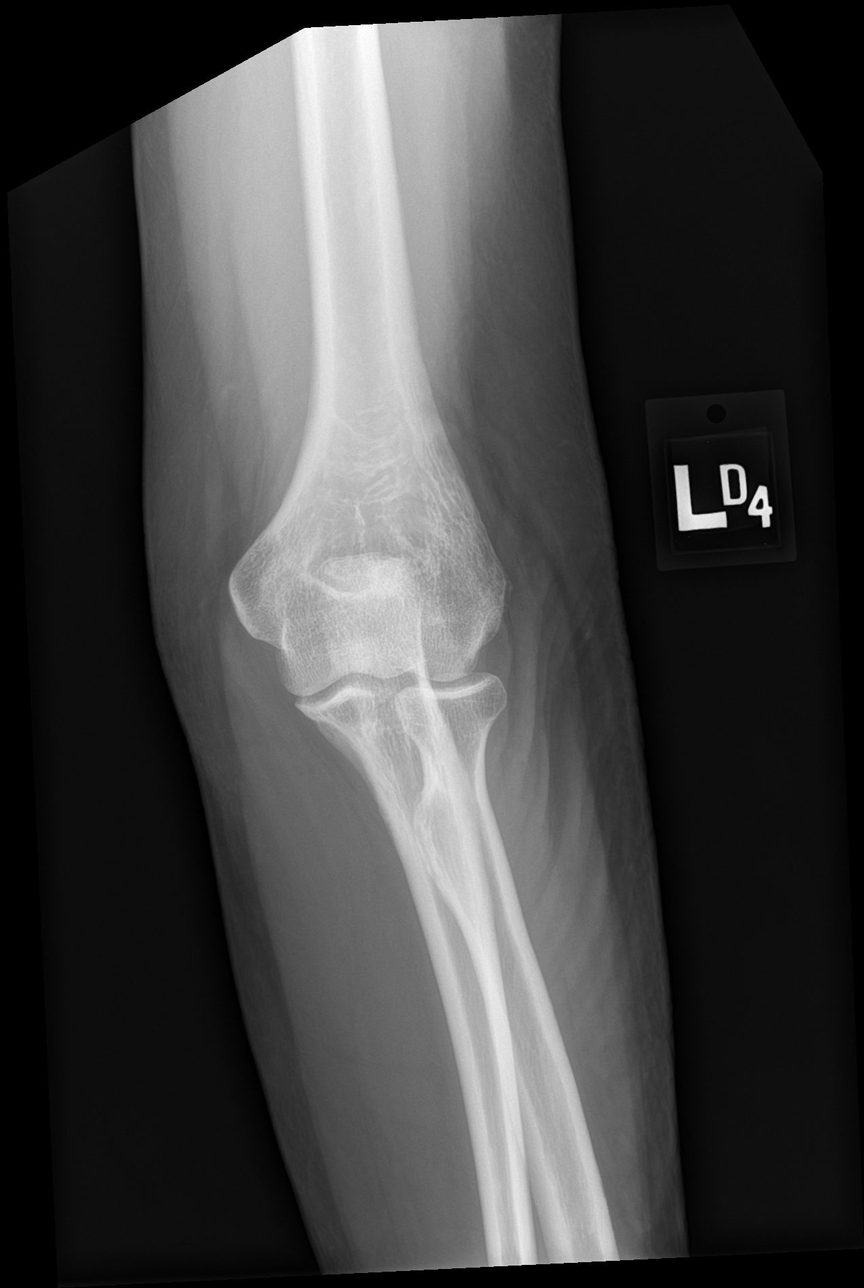

[elbow obl (1 of 2)]
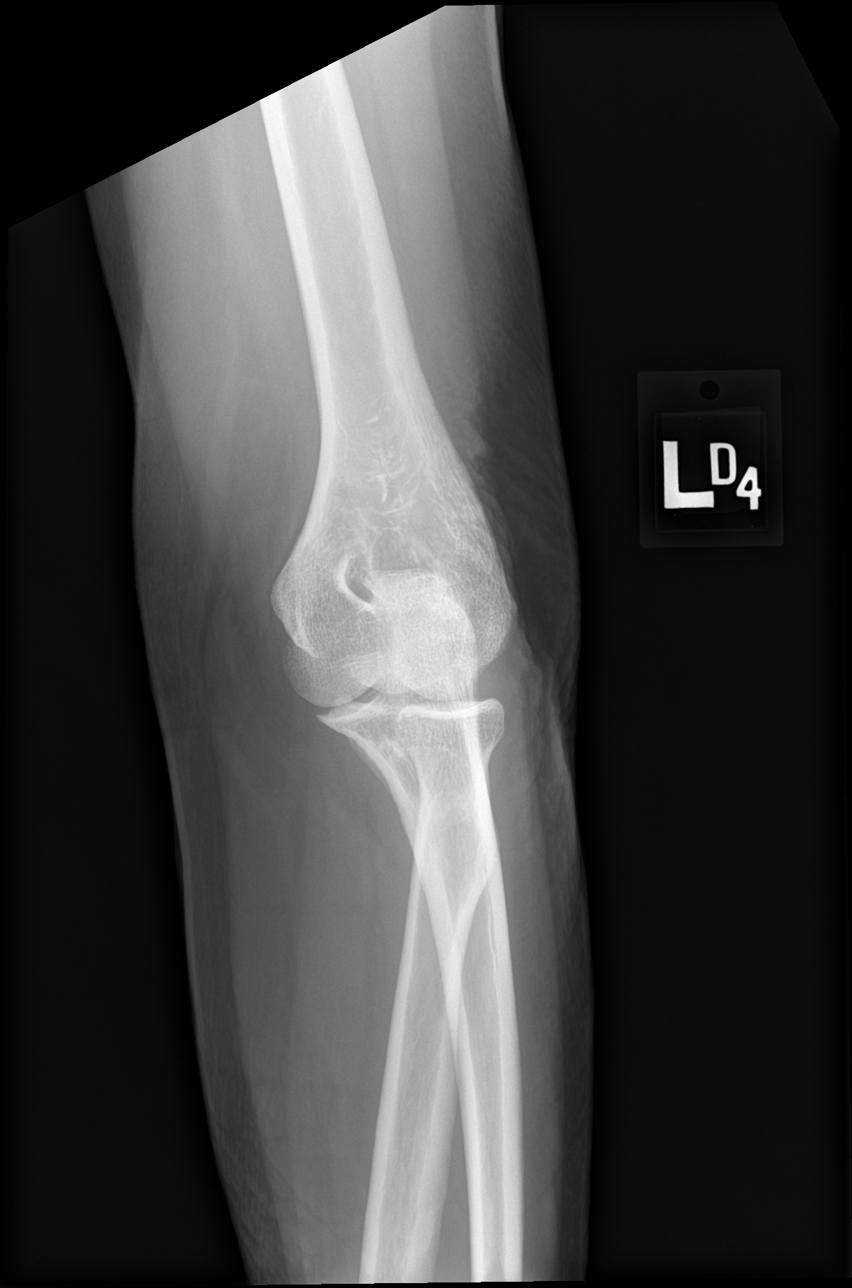

[elbow obl (2 of 2)]
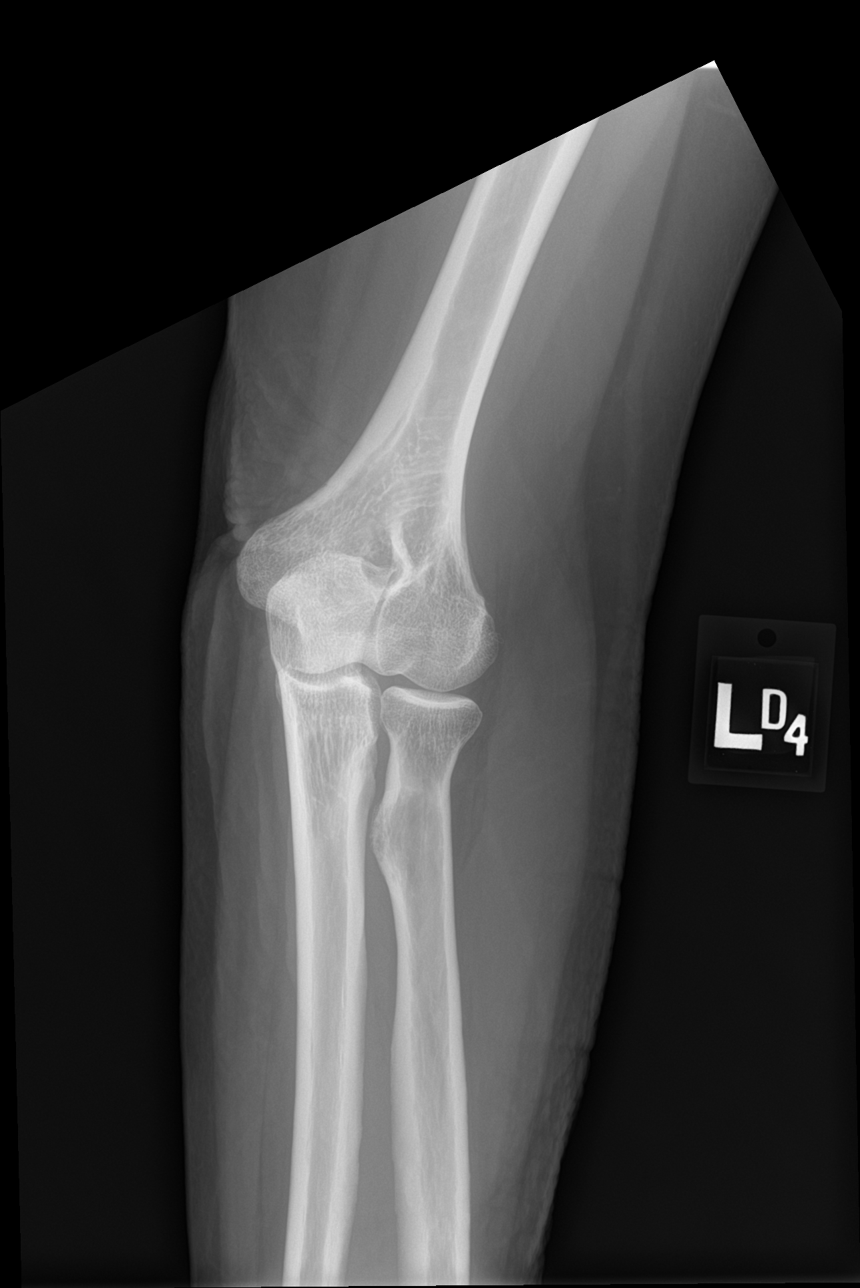

[elbow lat]
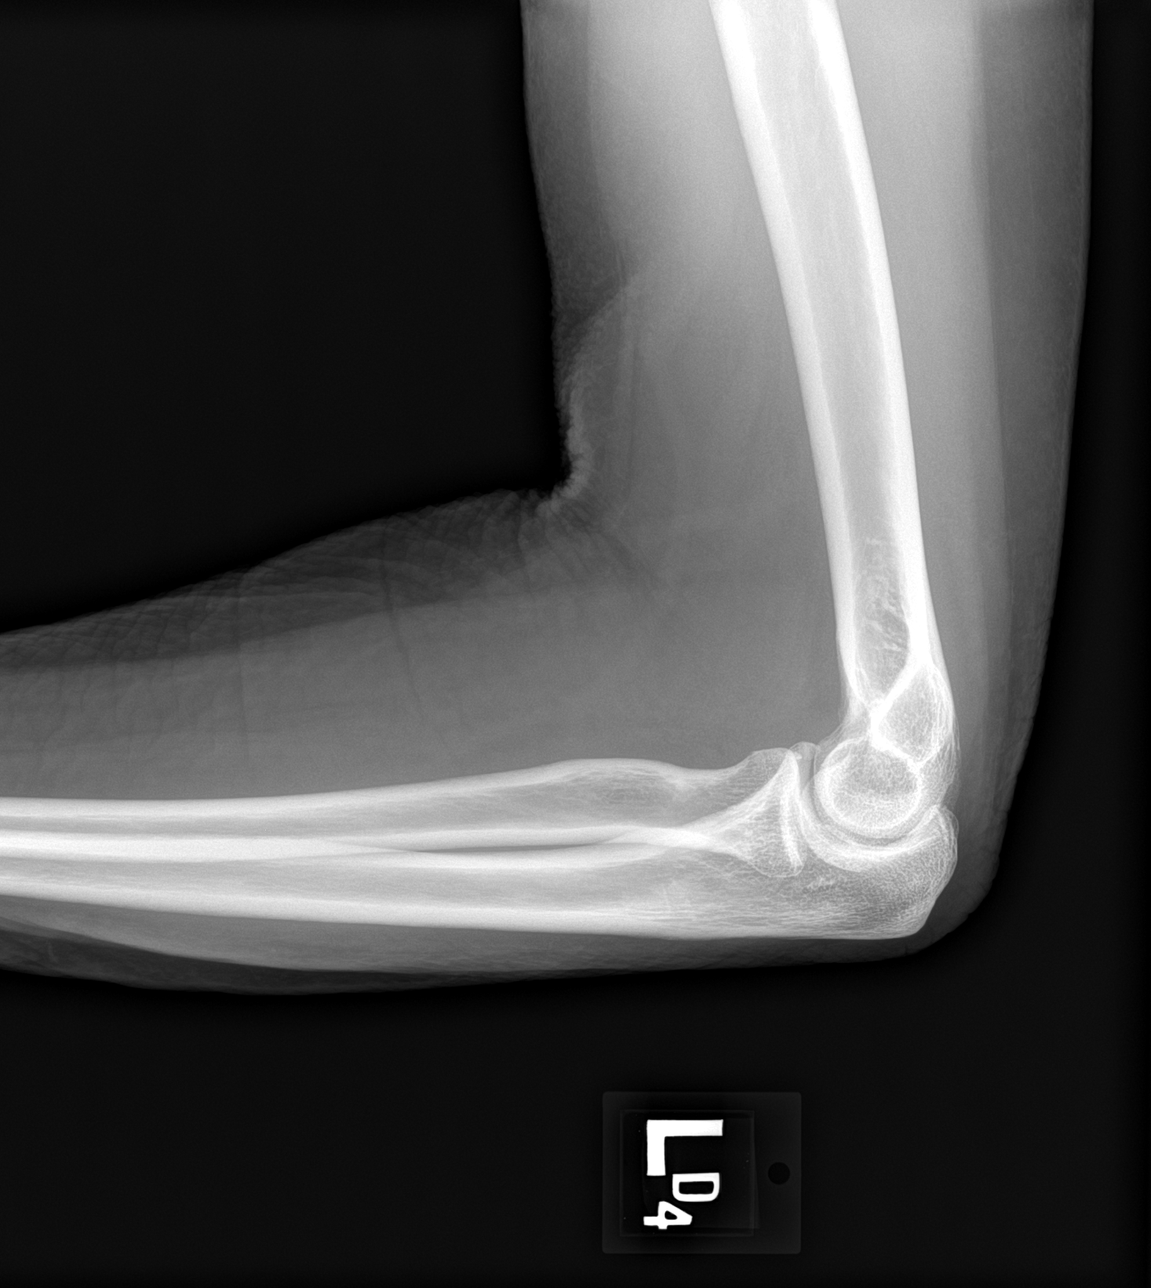

[4 of 4 positions shown; findings below may reference images not displayed]

FINDINGS: There is no evidence of fracture, dislocation, or joint effusion.
There is no evidence of arthropathy or other focal bone abnormality.
Soft tissues are unremarkable.
IMPRESSION: No acute abnormality noted.

## 2021-01-13 MED ORDER — PREDNISONE 10 MG PO TABS: 30.0000 mg | ORAL_TABLET | Freq: Every day | ORAL | 0 refills | Status: DC

## 2021-01-13 NOTE — Progress Notes (Signed)
Amber Hicks is a 70 y.o. female who presents to [place] at [facility] today for f/u of R ankle pain after rolling her ankle in Sept 2021.  She was last seen by Dr. Georgina Snell on 12/12/20 w/ c/o con't R ankle pain.  She was con't to wear a walking boot since the fall and was advised to come out of the boot at her last visit and begin PT ([facility]).  She was also advised to purchase a lace-up ankle brace and transition to this from her walking boot.  Since her last visit, pt reports that her R ankle is the same.  Pt states that she has not heard from PT regarding scheduling.  She is also c/o of L elbow pain x few weeks w/ no known MOI.  She locates her pain to her L med and lateral epicondyle. She is wearing a sling for her L elbow.     Diagnostic testing: R ankle MRI- 10/20/20; R ankle XR- 10/04/20, 07/16/20, 06/25/20  Pertinent review of systems: No fevers or chills  Relevant historical information: History of prior lateral epicondylitis left elbow requiring injection History of prior head injury  Exam:  BP (!) 144/78 (BP Location: Right Arm, Patient Position: Sitting, Cuff Size: Normal)   Pulse 60   Ht 5\' 5"  (1.651 m)   Wt 165 lb 3.2 oz (74.9 kg)   SpO2 96%   BMI 27.49 kg/m  General: Well Developed, well nourished, in pain appearing  MSK: Left elbow normal-appearing Tender palpation lateral epicondyle. Pain with motion. Pain with resisted wrist extension.  Right ankle normal-appearing mildly antalgic gait   Lab and Radiology Results X-ray images left elbow obtained today personally and independently interpreted No acute fractures.  Minimal degenerative changes.  No significant elbow effusion. Await formal radiology review  Diagnostic Limited MSK Ultrasound of: Left elbow lateral aspect Lateral epicondyle visualized.  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 female with left elbow pain thought to be due to lateral epicondylitis and chronic calcific tendinitis.  This will be challenging to treat for [facility].  She is in quite a bit of pain now.  In October 2021 when she had an injection in her left elbow she had a lot of difficulty and anxiety with the injection.  Based on how uncomfortable and anxious she is right now I am not optimistic about being able to do an injection or barbotaged and lavage/aspiration today.  After discussion we will proceed with short course of oral prednisone and reassess in 1 to 2 weeks.  At least she should be a little less painful and more likely to be able to proceed with trial of injection.  I think that direct injection into the calcification will help to break up the calcification and calm the inflammation around the calcification and should help her elbow feel a lot better.  As for her ankle not much is changed the last month.  She does seem to be walking a little bit better now.  Provided her with the phone number and address to physical therapy which she can call herself.  Recheck 1 to 2 weeks.   PDMP not reviewed this encounter. Orders Placed This Encounter  Procedures  . [place] LIMITED JOINT SPACE STRUCTURES UP LEFT(NO LINKED CHARGES)    Order Specific Question:   Reason for Exam (SYMPTOM  OR DIAGNOSIS REQUIRED)    Answer:   L elbow pain    Order Specific Question:   Preferred imaging location?    Answer:   [place]  . DG ELBOW COMPLETE LEFT (3+VIEW)    Standing Status:   Future    Number of Occurrences:   1    Standing Expiration Date:   01/13/2022    Order Specific Question:   Reason for Exam (SYMPTOM  OR DIAGNOSIS REQUIRED)    Answer:   eval left elbow pain calfici tendonitis?    Order Specific Question:   Preferred imaging location?    Answer:   [facility]    Meds ordered this encounter  Medications  . predniSONE (DELTASONE) 10 MG tablet    Sig: Take 3 tablets (30 mg total) by mouth daily with breakfast.    Dispense:  15 tablet    Refill:  0     Discussed warning signs or symptoms. Please see discharge instructions. Patient expresses understanding.   The above documentation has been reviewed and is accurate and complete Lynne Leader, M.D.

## 2021-01-13 NOTE — Patient Instructions (Addendum)
Thank you for coming in today.   Please call [facility] to schedule PT at [phone]. Address: [address], [place], [place] [address]  Please get an Xray today before you leave  Take the prednisone daily for 5 days.   Recheck in 1-2 weeks.  We will plan on injection then.

## 2021-01-14 NOTE — Progress Notes (Signed)
Radiology thought the elbow looked normal.  They could not see the calcification that I saw on ultrasound.  That is good news.

## 2021-01-15 ENCOUNTER — Ambulatory Visit (INDEPENDENT_AMBULATORY_CARE_PROVIDER_SITE_OTHER): Payer: Medicare Other | Admitting: Rehabilitative and Restorative Service Providers"

## 2021-01-15 ENCOUNTER — Other Ambulatory Visit: Payer: Self-pay

## 2021-01-15 ENCOUNTER — Ambulatory Visit (INDEPENDENT_AMBULATORY_CARE_PROVIDER_SITE_OTHER): Payer: Medicare Other | Admitting: Internal Medicine

## 2021-01-15 ENCOUNTER — Encounter: Payer: Self-pay | Admitting: Internal Medicine

## 2021-01-15 ENCOUNTER — Encounter: Payer: Self-pay | Admitting: Rehabilitative and Restorative Service Providers"

## 2021-01-15 ENCOUNTER — Ambulatory Visit: Payer: Medicare Other | Admitting: Physical Therapy

## 2021-01-15 VITALS — BP 130/68 | HR 64 | Temp 98.2°F | Ht 65.0 in | Wt 168.0 lb

## 2021-01-15 DIAGNOSIS — R600 Localized edema: Secondary | ICD-10-CM | POA: Diagnosis not present

## 2021-01-15 DIAGNOSIS — M6281 Muscle weakness (generalized): Secondary | ICD-10-CM | POA: Diagnosis not present

## 2021-01-15 DIAGNOSIS — Z0001 Encounter for general adult medical examination with abnormal findings: Secondary | ICD-10-CM

## 2021-01-15 DIAGNOSIS — M25671 Stiffness of right ankle, not elsewhere classified: Secondary | ICD-10-CM | POA: Diagnosis not present

## 2021-01-15 DIAGNOSIS — Z23 Encounter for immunization: Secondary | ICD-10-CM

## 2021-01-15 DIAGNOSIS — E538 Deficiency of other specified B group vitamins: Secondary | ICD-10-CM | POA: Diagnosis not present

## 2021-01-15 DIAGNOSIS — Z1231 Encounter for screening mammogram for malignant neoplasm of breast: Secondary | ICD-10-CM

## 2021-01-15 DIAGNOSIS — R262 Difficulty in walking, not elsewhere classified: Secondary | ICD-10-CM

## 2021-01-15 DIAGNOSIS — Z01818 Encounter for other preprocedural examination: Secondary | ICD-10-CM | POA: Insufficient documentation

## 2021-01-15 DIAGNOSIS — M25571 Pain in right ankle and joints of right foot: Secondary | ICD-10-CM

## 2021-01-15 DIAGNOSIS — E559 Vitamin D deficiency, unspecified: Secondary | ICD-10-CM

## 2021-01-15 DIAGNOSIS — Z1211 Encounter for screening for malignant neoplasm of colon: Secondary | ICD-10-CM | POA: Diagnosis not present

## 2021-01-15 DIAGNOSIS — E7800 Pure hypercholesterolemia, unspecified: Secondary | ICD-10-CM

## 2021-01-15 MED ORDER — ROSUVASTATIN CALCIUM 20 MG PO TABS: 20.0000 mg | ORAL_TABLET | Freq: Every day | ORAL | 3 refills | Status: AC

## 2021-01-15 NOTE — Assessment & Plan Note (Signed)
Uncontrolled, to restart the statin, goal ldl < 70 Lab Results  Component Value Date   LDLCALC 153 (H) 04/23/2020
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
## 2021-01-15 NOTE — Patient Instructions (Signed)
Ok to restart the rosuvastatin for the high cholesterol  You will be contacted regarding the referral for: colonoscopy, and mammogram  You had the Prevnar 13 pnemonia shot today  Please continue all other medications as before, and refills have been done if requested.  Please have the pharmacy call with any other refills you may need.  Please continue your efforts at being more active, low cholesterol diet, and weight control.  You are otherwise up to date with prevention measures today.  Please keep your appointments with your specialists as you may have planned  Please go to the LAB at the blood drawing area for the tests to be done  You will be contacted by phone if any changes need to be made immediately.  Otherwise, you will receive a letter about your results with an explanation, but please check with MyChart first.  Please remember to sign up for MyChart if you have not done so, as this will be important to you in the future with finding out test results, communicating by private email, and scheduling acute appointments online when needed.  Please make an Appointment to return in 6 months, or sooner if needed

## 2021-01-15 NOTE — Patient Instructions (Signed)
Access Code: [email or web address] URL: [email or web address] Date: 01/15/2021 Prepared by: Vista Mink  Exercises Long Sitting Isometric Ankle Eversion in Plantar Flexion with Ball at [place] - 2-3 x daily - 7 x weekly - 2 sets - 10 reps - 5 seconds hold Long Sitting Isometric Ankle Inversion in Plantar Flexion with Ball at [place] - 2-3 x daily - 7 x weekly - 2 sets - 10 reps - 5 seconds hold Tandem Stance - 2-3 x daily - 7 x weekly - 1 sets - 4 reps - 20 second hold Heel Raise - 2-3 x daily - 7 x weekly - 1 sets - 10 reps - 3 seconds hold

## 2021-01-15 NOTE — Progress Notes (Signed)
Patient ID: Amber Hicks, female   DOB: May 19, 1951, 70 y.o.   MRN: [id number]         Chief Complaint:: wellness exam and Medication Problem  , hld, vit d def, preop exam       HPI:  Amber Hicks is a 70 y.o. female here for wellness exam; walks with cane, no falls. Due for colonoscopy, mammogram, and prevnar 13.  O/w up to date with preventive referrals and immunizations                        Also admits not taking crestor, now willing to restart. Pt denies chest pain, increased sob or doe, wheezing, orthopnea, PND, increased LE swelling, palpitations, dizziness or syncope.   Pt denies polydipsia, polyuria, Denies new focal neuro s/s.   Pt denies fever, wt loss, night sweats, loss of appetite, or other constitutional symptoms  No other new complaints  Due for surgury soon.    Wt Readings from Last 3 Encounters:  01/15/21 168 lb (76.2 kg)  01/13/21 165 lb 3.2 oz (74.9 kg)  12/10/20 166 lb (75.3 kg)   BP Readings from Last 3 Encounters:  01/15/21 130/68  01/13/21 (!) 144/78  12/12/20 122/70   Immunization History  Administered Date(s) Administered  . Influenza, High Dose Seasonal PF 06/16/2018  . Pneumococcal Conjugate-13 01/15/2021  . Pneumococcal Polysaccharide-23 06/16/2018  . Tdap 01/22/2012  . Zoster 04/05/2013  There are no preventive care reminders to display for this patient.    Past Medical History:  Diagnosis Date  . Depression 01/22/2012  . Hx of adenomatous colonic polyps 01/26/2015  . Hyperlipidemia 01/24/2012  . VITAMIN D DEFICIENCY 07/09/2010   Qualifier: Diagnosis of  By: Jenny Reichmann MD, Hunt Oris    Past Surgical History:  Procedure Laterality Date  . ceaserian section     1 time    reports that she has never smoked. She has never used smokeless tobacco. She reports that she does not drink alcohol and does not use drugs. family history includes Heart disease in her brother and mother. Allergies  Allergen Reactions  . Doxycycline Rash   Current Outpatient  Medications on File Prior to Visit  Medication Sig Dispense Refill  . acetaminophen (TYLENOL) 325 MG tablet Take 650 mg by mouth every 6 (six) hours as needed for mild pain.    Marland Kitchen ibuprofen (ADVIL) 600 MG tablet Take 1 tablet (600 mg total) by mouth every 8 (eight) hours as needed for headache. 30 tablet 0  . pantoprazole (PROTONIX) 40 MG tablet Take 1 tablet (40 mg total) by mouth daily. 90 tablet 3   No current facility-administered medications on file prior to visit.        ROS:  All others reviewed and negative.  Objective        PE:  BP 130/68 (BP Location: Right Arm, Patient Position: Sitting, Cuff Size: Large)   Pulse 64   Temp 98.2 F (36.8 C) (Oral)   Ht 5\' 5"  (1.651 m)   Wt 168 lb (76.2 kg)   SpO2 98%   BMI 27.96 kg/m                 Constitutional: Pt appears in NAD               HENT: Head: NCAT.                Right Ear: External ear normal.  Left Ear: External ear normal.                Eyes: . Pupils are equal, round, and reactive to light. Conjunctivae and EOM are normal               Nose: without d/c or deformity               Neck: Neck supple. Gross normal ROM               Cardiovascular: Normal rate and regular rhythm.                 Pulmonary/Chest: Effort normal and breath sounds without rales or wheezing.                Abd:  Soft, NT, ND, + BS, no organomegaly               Neurological: Pt is alert. At baseline orientation, motor grossly intact               Skin: Skin is warm. No rashes, no other new lesions, LE edema - none               Psychiatric: Pt behavior is normal without agitation   Micro: none  Cardiac tracings I have personally interpreted today:  none  Pertinent Radiological findings (summarize): none   Lab Results  Component Value Date   WBC 6.7 12/06/2020   HGB 12.7 12/06/2020   HCT 39.9 12/06/2020   PLT 259 12/06/2020   GLUCOSE 99 12/06/2020   CHOL 215 (H) 04/23/2020   TRIG 94 04/23/2020   HDL 42 (L)  04/23/2020   LDLDIRECT 163.0 01/22/2012   LDLCALC 153 (H) 04/23/2020   ALT 15 12/06/2020   AST 17 12/06/2020   NA 142 12/06/2020   K 3.5 12/06/2020   CL 110 12/06/2020   CREATININE 0.79 12/06/2020   BUN 13 12/06/2020   CO2 26 12/06/2020   TSH 2.07 04/23/2020   Assessment/Plan:  Amber Hicks is a 70 y.o. White or Caucasian [1] female with  has a past medical history of Depression (01/22/2012), adenomatous colonic polyps (01/26/2015), Hyperlipidemia (01/24/2012), and VITAMIN D DEFICIENCY (07/09/2010).  Hyperlipidemia Uncontrolled, to restart the statin, goal ldl < 70 Lab Results  Component Value Date   LDLCALC 153 (H) 04/23/2020    Encounter for preventative adult health care exam with abnormal findings Age and sex appropriate education and counseling updated with regular exercise and diet Referrals for preventative services - for colonoscopy, mammogram Immunizations addressed - for prevnar 13 Smoking counseling  - none needed Evidence for depression or other mood disorder - none significant Most recent labs reviewed. I have personally reviewed and have noted: 1) the patient's medical and social history 2) The patient's current medications and supplements 3) The patient's height, weight, and BMI have been recorded in the chart   Vitamin D deficiency Last vitamin D Lab Results  Component Value Date   VD25OH 19 (L) 04/23/2020   Low, to start oral replacement  Preop exam for internal medicine Exam benign, cleared for surgury as planned  Followup: Return in about 6 months (around 07/17/2021).  Cathlean Cower, MD 01/19/2021 6:58 PM [facility]

## 2021-01-19 ENCOUNTER — Encounter: Payer: Self-pay | Admitting: Internal Medicine

## 2021-01-19 NOTE — Assessment & Plan Note (Signed)
Exam benign, cleared for surgury as planned

## 2021-01-19 NOTE — Assessment & Plan Note (Signed)
Last vitamin D Lab Results  Component Value Date   VD25OH 19 (L) 04/23/2020   Low, to start oral replacement 

## 2021-01-19 NOTE — Assessment & Plan Note (Signed)
Age and sex appropriate education and counseling updated with regular exercise and diet Referrals for preventative services - for colonoscopy, mammogram Immunizations addressed - for prevnar 13 Smoking counseling  - none needed Evidence for depression or other mood disorder - none significant Most recent labs reviewed. I have personally reviewed and have noted: 1) the patient's medical and social history 2) The patient's current medications and supplements 3) The patient's height, weight, and BMI have been recorded in the chart

## 2021-01-23 ENCOUNTER — Encounter: Payer: Self-pay | Admitting: Family Medicine

## 2021-01-23 ENCOUNTER — Ambulatory Visit: Payer: Self-pay

## 2021-01-23 ENCOUNTER — Ambulatory Visit (INDEPENDENT_AMBULATORY_CARE_PROVIDER_SITE_OTHER): Payer: Medicare Other | Admitting: Family Medicine

## 2021-01-23 ENCOUNTER — Other Ambulatory Visit (INDEPENDENT_AMBULATORY_CARE_PROVIDER_SITE_OTHER): Payer: Medicare Other

## 2021-01-23 ENCOUNTER — Encounter: Payer: Self-pay | Admitting: Internal Medicine

## 2021-01-23 ENCOUNTER — Ambulatory Visit (INDEPENDENT_AMBULATORY_CARE_PROVIDER_SITE_OTHER): Payer: Medicare Other

## 2021-01-23 ENCOUNTER — Other Ambulatory Visit: Payer: Self-pay

## 2021-01-23 VITALS — BP 110/68 | HR 88 | Ht 65.0 in | Wt 161.8 lb

## 2021-01-23 DIAGNOSIS — E538 Deficiency of other specified B group vitamins: Secondary | ICD-10-CM

## 2021-01-23 DIAGNOSIS — M25522 Pain in left elbow: Secondary | ICD-10-CM

## 2021-01-23 DIAGNOSIS — E559 Vitamin D deficiency, unspecified: Secondary | ICD-10-CM | POA: Diagnosis not present

## 2021-01-23 DIAGNOSIS — M7712 Lateral epicondylitis, left elbow: Secondary | ICD-10-CM

## 2021-01-23 DIAGNOSIS — E7800 Pure hypercholesterolemia, unspecified: Secondary | ICD-10-CM | POA: Diagnosis not present

## 2021-01-23 LAB — BASIC METABOLIC PANEL
BUN: 18 mg/dL (ref 6–23)
CO2: 28 mEq/L (ref 19–32)
Calcium: 8.9 mg/dL (ref 8.4–10.5)
Chloride: 107 mEq/L (ref 96–112)
Creatinine, Ser: 1.05 mg/dL (ref 0.40–1.20)
GFR: 54.78 mL/min — ABNORMAL LOW (ref >60.00)
Glucose, Bld: 96 mg/dL (ref 70–99)
Potassium: 3.9 mEq/L (ref 3.5–5.1)
Sodium: 142 mEq/L (ref 135–145)

## 2021-01-23 LAB — HEPATIC FUNCTION PANEL
ALT: 11 U/L (ref 0–35)
AST: 13 U/L (ref 0–37)
Albumin: 3.8 g/dL (ref 3.5–5.2)
Alkaline Phosphatase: 83 U/L (ref 39–117)
Bilirubin, Direct: 0.1 mg/dL (ref 0.0–0.3)
Total Bilirubin: 0.3 mg/dL (ref 0.2–1.2)
Total Protein: 6.8 g/dL (ref 6.0–8.3)

## 2021-01-23 LAB — CBC WITH DIFFERENTIAL/PLATELET
Basophils Absolute: 0 10*3/uL (ref 0.0–0.1)
Basophils Relative: 0.6 % (ref 0.0–3.0)
Eosinophils Absolute: 0.2 10*3/uL (ref 0.0–0.7)
Eosinophils Relative: 2.8 % (ref 0.0–5.0)
HCT: 39.7 % (ref 36.0–46.0)
Hemoglobin: 13.3 g/dL (ref 12.0–15.0)
Lymphocytes Relative: 33.2 % (ref 12.0–46.0)
Lymphs Abs: 2.5 10*3/uL (ref 0.7–4.0)
MCHC: 33.4 g/dL (ref 30.0–36.0)
MCV: 92.7 fl (ref 78.0–100.0)
Monocytes Absolute: 0.9 10*3/uL (ref 0.1–1.0)
Monocytes Relative: 11.8 % (ref 3.0–12.0)
Neutro Abs: 3.9 10*3/uL (ref 1.4–7.7)
Neutrophils Relative %: 51.6 % (ref 43.0–77.0)
Platelets: 228 10*3/uL (ref 150.0–400.0)
RBC: 4.29 Mil/uL (ref 3.87–5.11)
RDW: 14 % (ref 11.5–15.5)
WBC: 7.5 10*3/uL (ref 4.0–10.5)

## 2021-01-23 LAB — URINALYSIS, ROUTINE W REFLEX MICROSCOPIC
Bilirubin Urine: NEGATIVE
Ketones, ur: NEGATIVE
Nitrite: NEGATIVE
Specific Gravity, Urine: 1.025 (ref 1.000–1.030)
Urine Glucose: NEGATIVE
Urobilinogen, UA: 2 — AB (ref 0.0–1.0)
pH: 5.5 (ref 5.0–8.0)

## 2021-01-23 LAB — LIPID PANEL
Cholesterol: 139 mg/dL (ref 0–200)
HDL: 37.5 mg/dL — ABNORMAL LOW (ref >39.00)
LDL Cholesterol: 72 mg/dL (ref 0–99)
NonHDL: 101.86
Total CHOL/HDL Ratio: 4
Triglycerides: 148 mg/dL (ref 0.0–149.0)
VLDL: 29.6 mg/dL (ref 0.0–40.0)

## 2021-01-23 LAB — VITAMIN B12: Vitamin B-12: 262 pg/mL (ref 211–911)

## 2021-01-23 LAB — TSH: TSH: 1.28 u[IU]/mL (ref 0.35–4.50)

## 2021-01-23 LAB — VITAMIN D 25 HYDROXY (VIT D DEFICIENCY, FRACTURES): VITD: 20.4 ng/mL — ABNORMAL LOW (ref 30.00–100.00)

## 2021-01-23 IMAGING — DX DG ELBOW COMPLETE 3+V*L*
4 series · 4 of 4 positions shown · non-contrast
Comparison: None.

CLINICAL DATA: Left elbow pain after a fall.  Initial encounter.

EXAM:
LEFT ELBOW - COMPLETE 3+ VIEW

[elbow ap]
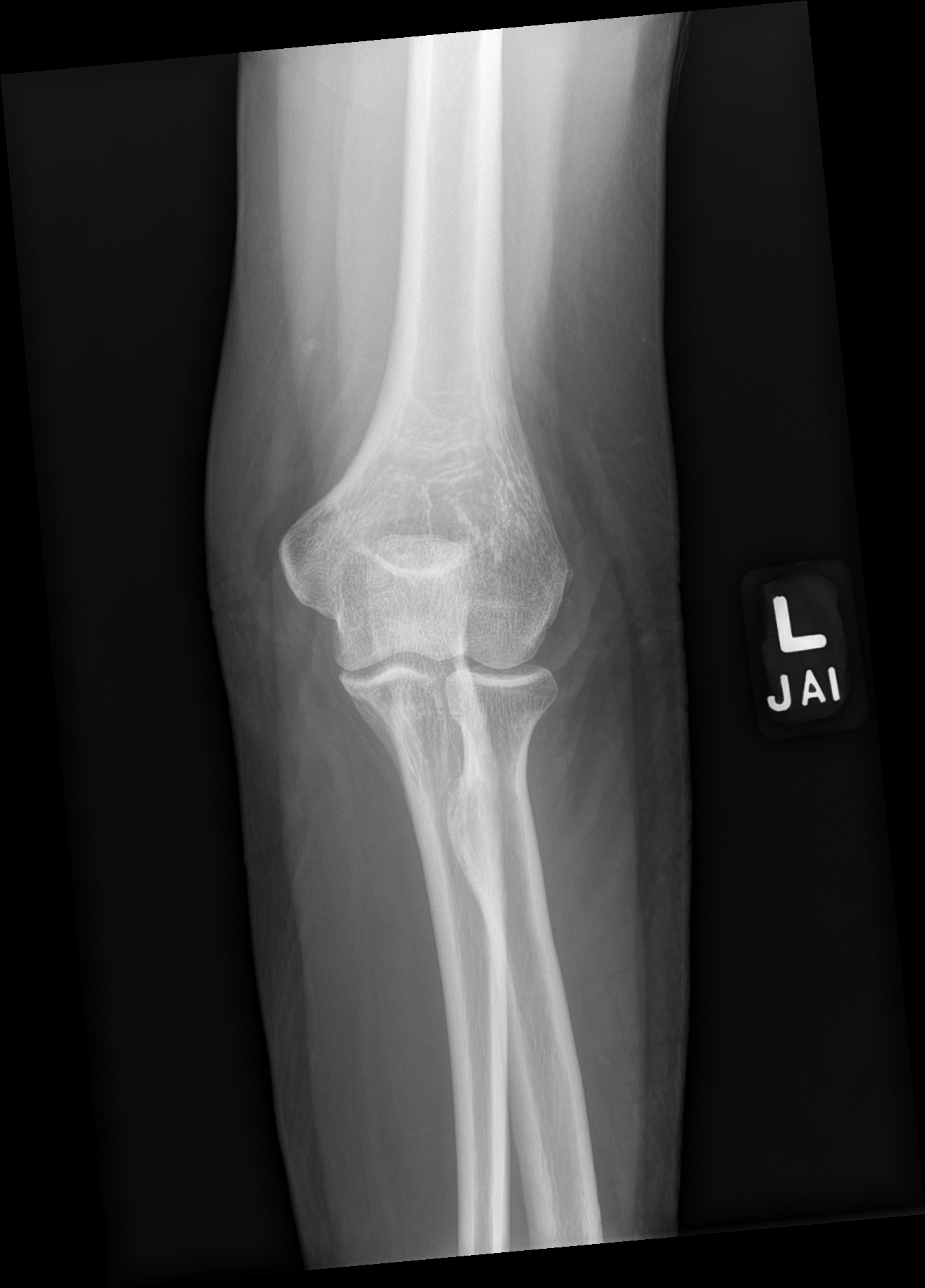

[elbow obl (1 of 2)]
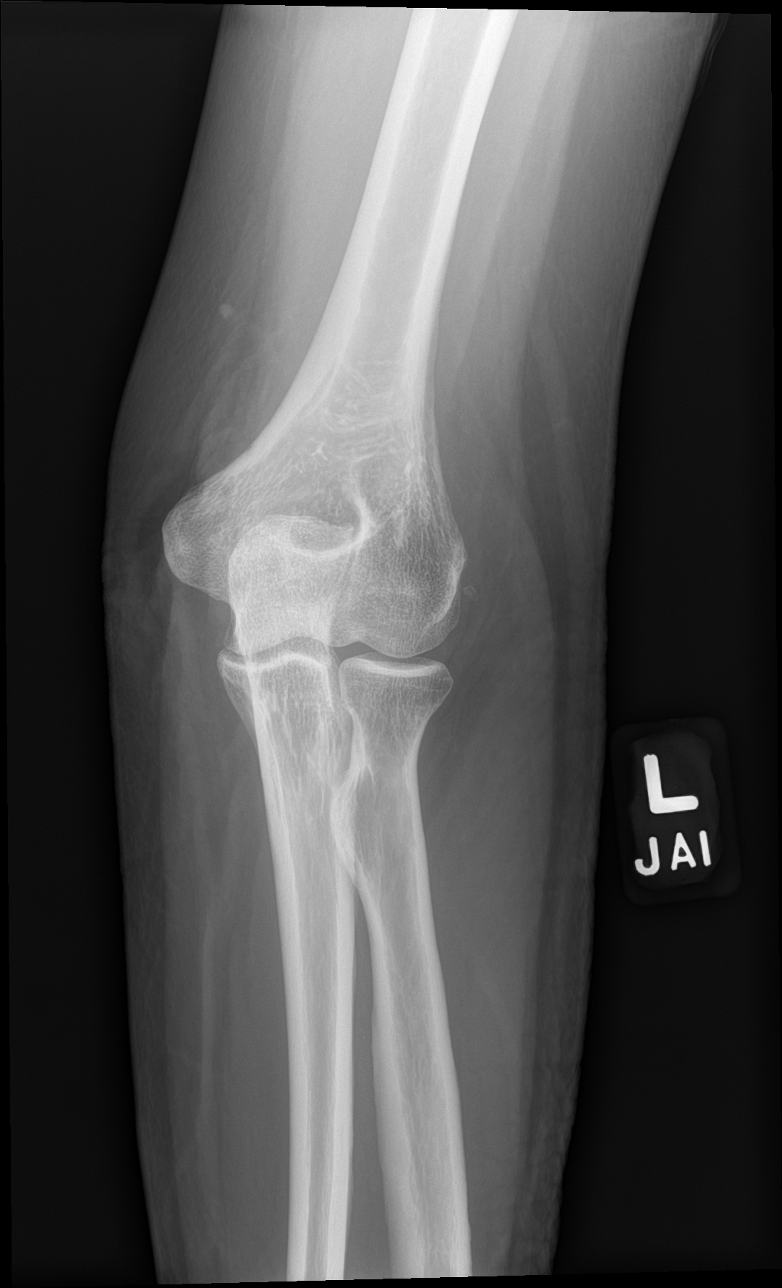

[elbow obl (2 of 2)]
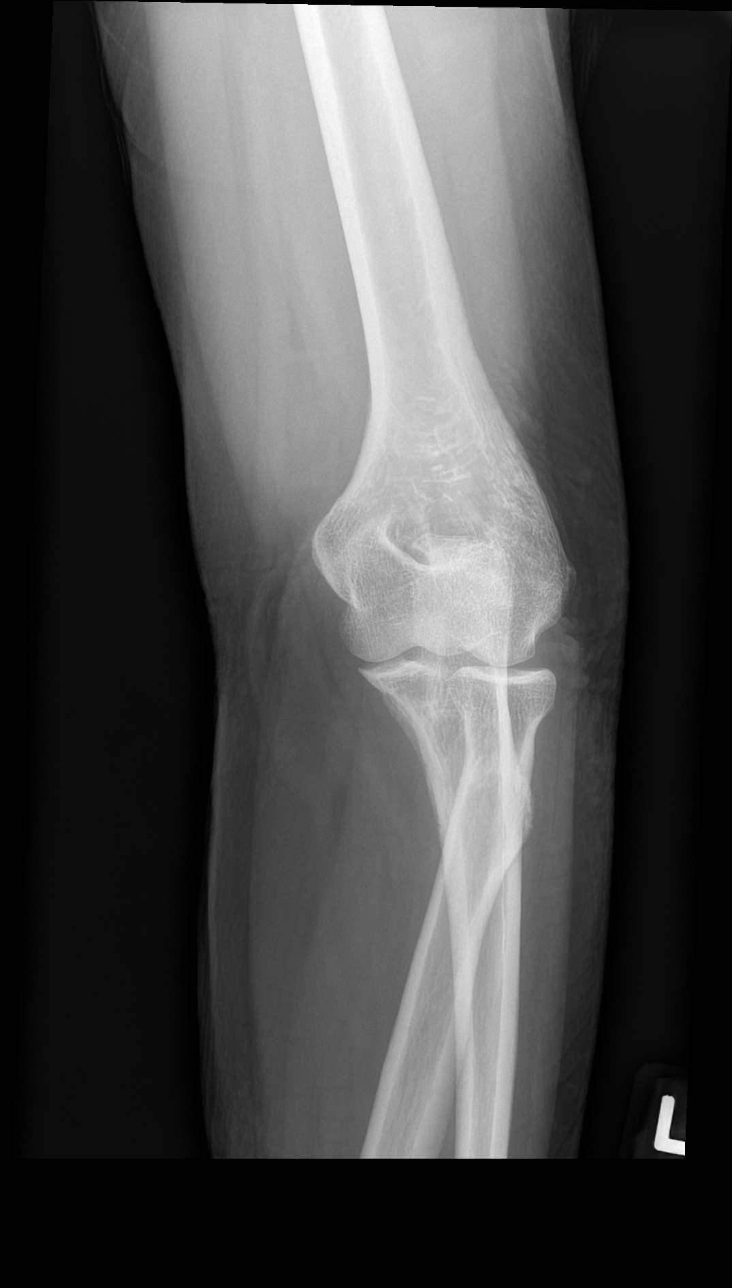

[elbow lat]
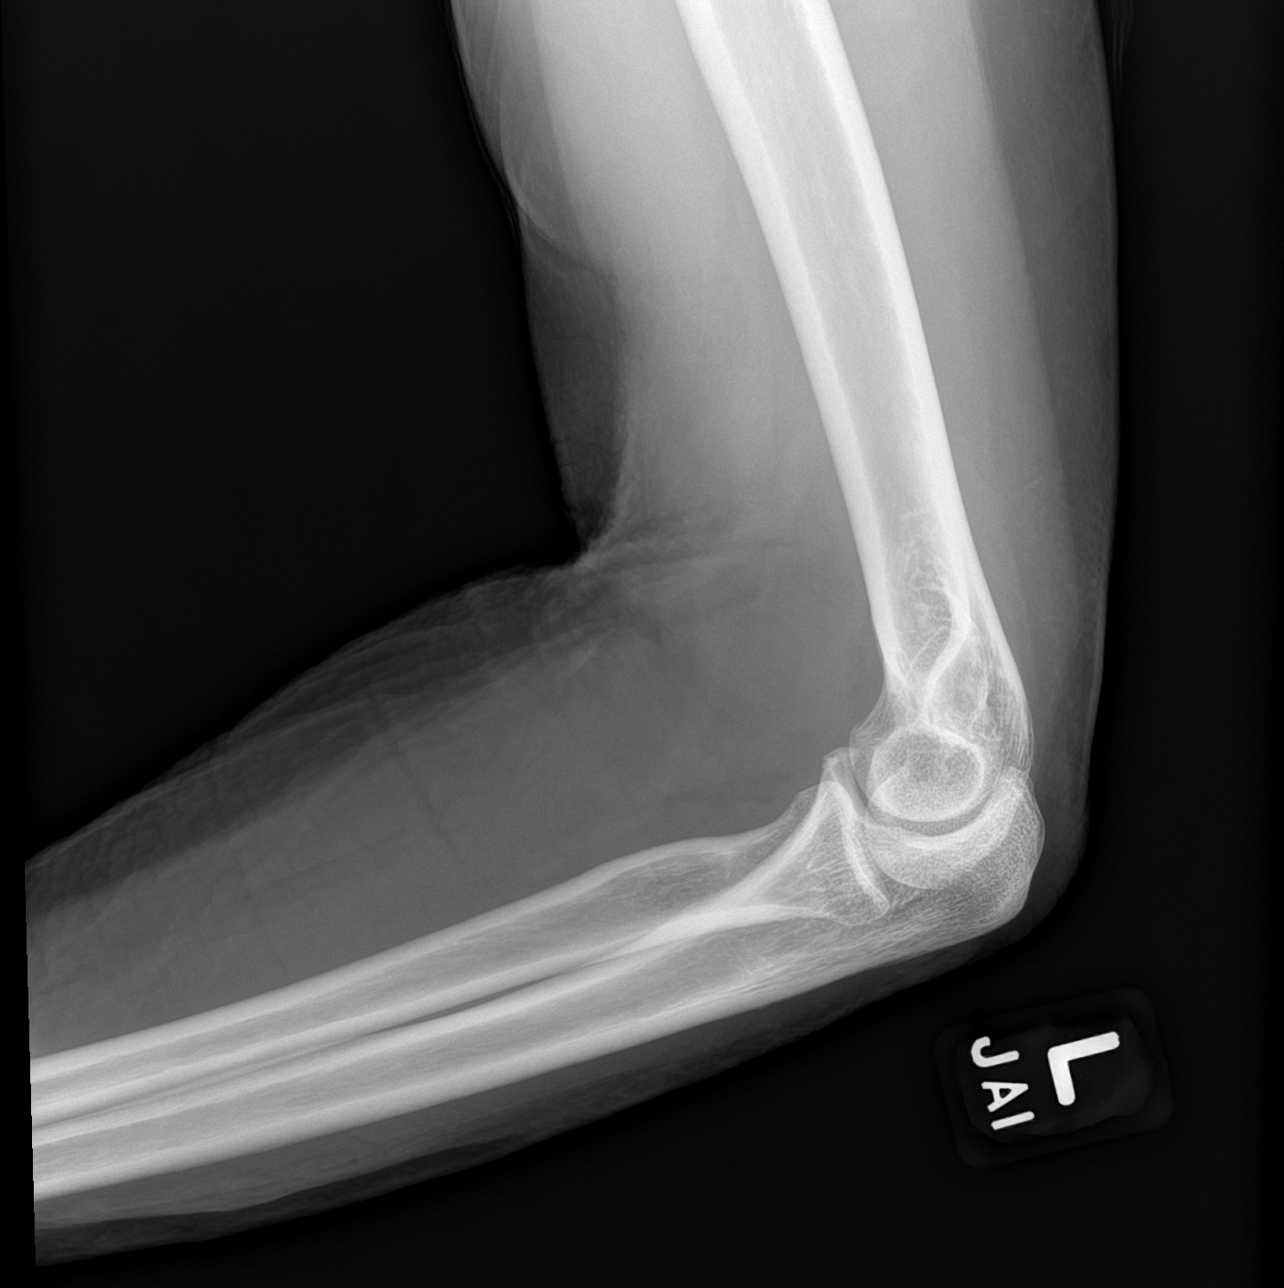

[4 of 4 positions shown; findings below may reference images not displayed]

FINDINGS: There is no evidence of fracture, dislocation, or joint effusion.
There is no evidence of arthropathy or other focal bone abnormality.
Soft tissues are unremarkable.
IMPRESSION: Negative exam.

## 2021-01-23 MED ORDER — LORAZEPAM 0.5 MG PO TABS: ORAL_TABLET | ORAL | 0 refills | Status: DC

## 2021-01-23 MED ORDER — LIDOCAINE-PRILOCAINE 2.5-2.5 % EX CREA: 1.0000 "application " | TOPICAL_CREAM | CUTANEOUS | 1 refills | Status: DC | PRN

## 2021-01-23 NOTE — Progress Notes (Signed)
I, Amber Hicks, LAT, ATC, am serving as scribe for Dr. Lynne Leader.  LARAYA Hicks is a 70 y.o. female who presents to [facility] today for f/u of R ankle and L elbow pain.  She was last seen by Dr. Georgina Snell on 01/13/21 for R ankle f/u and new L elbow pain.  She was again provided the phone number for PT as she had not completed any visits for her ankle since her prior visit.  She has now completed one PT session.  She was prescribed a short course of prednisone for her L elbow.  Since her last visit, pt reports that she fell one day this week, landing on her back and her L elbow.  She states that her L elbow is feeling worse after the fall.  Her R ankle is feeling about the same.  Diagnostic imaging: L elbow XR- 01/13/21; R ankle MRI- 10/20/20; R ankle XR- 10/04/20, 07/16/20, 06/25/20  Pertinent review of systems: No fevers or chills  Relevant historical information: Needle phobia.  Prior head injury   Exam:  BP 110/68 (BP Location: Right Arm, Patient Position: Sitting, Cuff Size: Normal)   Pulse 88   Ht 5\' 5"  (1.651 m)   Wt 161 lb 12.8 oz (73.4 kg)   SpO2 96%   BMI 26.92 kg/m  General: Well Developed, well nourished, and in no acute distress.   MSK: Left elbow normal-appearing tender palpation at lateral epicondyle. Normal motion.    Lab and Radiology Results  X-ray images left elbow obtained today personally and independently interpreted No acute fractures visible. Await formal radiology review     Assessment and Plan: 70 y.o. female with left elbow pain due to lateral epicondylitis.  Although she did have a recent fall I do not see any new acute injuries or fractures.  Based on prior exam patient has chronic calcific tendinopathy that probably is the source of her pain.  I do not see her getting much better on her own and likely will need ultrasound-guided injection into the area of calcific change.  She is very needle phobic and based on my  prior experience attempting an injection on her last year this would be a big challenge.  Discussed options with Donye and her brother today.  We will try our best to set the stage for a good injection attempts.  We will prescribe Emla cream to apply to the arm ahead of time to make it less painful and prescribe Ativan to take ahead of time to treat some of the procedural anxiety.  We will schedule the injection for next week and try our best to make this a smooth procedure.   PDMP not reviewed this encounter. Orders Placed This Encounter  Procedures  . [place] LIMITED JOINT SPACE STRUCTURES UP LEFT(NO LINKED CHARGES)    Order Specific Question:   Reason for Exam (SYMPTOM  OR DIAGNOSIS REQUIRED)    Answer:   L elbow pain    Order Specific Question:   Preferred imaging location?    Answer:   [place]  . DG ELBOW COMPLETE LEFT (3+VIEW)    Standing Status:   Future    Number of Occurrences:   1    Standing Expiration Date:   01/23/2022    Order Specific Question:   Reason for Exam (SYMPTOM  OR DIAGNOSIS REQUIRED)    Answer:   eval left elbow pain after fall    Order Specific Question:  Preferred imaging location?    Answer:   [facility]   Meds ordered this encounter  Medications  . lidocaine-prilocaine (EMLA) cream    Sig: Apply 1 application topically as needed. Apply to the elbow and cover with saran wrap 1 hour prior to the shot    Dispense:  30 g    Refill:  1  . LORazepam (ATIVAN) 0.5 MG tablet    Sig: 1-2 tabs 30 - 60 min prior to shot. Do not drive with this medicine.    Dispense:  4 tablet    Refill:  0     Discussed warning signs or symptoms. Please see discharge instructions. Patient expresses understanding.   The above documentation has been reviewed and is accurate and complete Lynne Leader, M.D.

## 2021-01-23 NOTE — Patient Instructions (Addendum)
Thank you for coming in today.  Apply the EMLA cream to the elbow 1 hour prior to the shot.   Take the ativan 1 hour prior to the shot.   Schedule the shot in the elbow with me next week.   Please get an Xray today before you leave

## 2021-01-27 NOTE — Progress Notes (Signed)
Left elbow x-ray looks normal to radiology.

## 2021-01-28 NOTE — Progress Notes (Signed)
I, Amber Hicks, LAT, ATC, am serving as scribe for Dr. Lynne Hicks.  Amber Hicks is a 70 y.o. female who presents to [place] at [facility] today for L elbow pain f/u and possible injection.  She was last seen by Amber Hicks on 01/23/21 and noted worsening of her L elbow after suffering fall earlier that week.  She loosely wears a sling.  Due to her fear of needles, pt was prescribed Emla cream and Ativan to use in advance of today's appt.  Since her last visit, pt reports that her L elbow pain is the same as previously.  Diagnostic testing: L elbow XR- 01/23/21, 01/13/21  Pertinent review of systems: No fevers or chills  Relevant historical information: Severe needle phobia.  History of prior head injury   Exam:  BP 120/72 (BP Location: Right Arm, Patient Position: Sitting, Cuff Size: Normal)   Pulse 78   Ht 5\' 5"  (1.651 m)   Wt 158 lb 9.6 oz (71.9 kg)   SpO2 98%   BMI 26.39 kg/m  General: Well Developed, well nourished, and in no acute distress.   MSK: Left elbow tender palpation lateral epicondyle    Lab and Radiology Results  Procedure: Real-time Ultrasound Guided Injection of left lateral epicondyle common extensor tendon insertion area of calcification Device: Philips Affiniti 50G Images permanently stored and available for review in PACS Verbal informed consent obtained.  Discussed risks and benefits of procedure. Warned about infection bleeding damage to structures skin hypopigmentation and fat atrophy among others. Patient expresses understanding and agreement Time-out conducted.   Noted no overlying erythema, induration, or other signs of local infection.   Skin prepped in a sterile fashion.   Local anesthesia: Topical Ethyl chloride.   With sterile technique and under real time ultrasound guidance:  40 mg of Kenalog and 2 mL of Marcaine injected into the tendon insertion. Fluid seen entering the tendon insertion.   Patient experienced lots of  anxiety and moved throughout the injection.  Injection was completed quickly Pain immediately resolved suggesting accurate placement of the medication.   Advised to call if fevers/chills, erythema, induration, drainage, or persistent bleeding.   Images permanently stored and available for review in the ultrasound unit.  Impression: Technically successful ultrasound guided injection.    Assessment and Plan: 70 y.o. female with left persistent elbow pain due to recurrent left lateral epicondylitis.  Ultrasound examination previously showed calcific change consistent with calcific tendinopathy.  Plan for repeat injection today.  Unfortunately despite our best efforts of prescribing Emla cream and Ativan prior to the injection she still had lots of anxiety and discomfort during the injection and I was unable to do a slow targeted careful injection of the calcification and had to do a more rapid injection.  I do not think [name] will tolerate further conservative management injection type treatment for her calcific tendinopathy should this injection not work.  She will not tolerate barbotage and lavage of this calcific tendinopathy at least not conscious.  She has too much pain to tolerate extracorporeal shockwave therapy.  Additionally she has trouble tolerating physical therapy.  Should she not improve surgery may be the best option although she is not a good surgical candidate either.  Possibly she may need 1 of these injection type procedures done under moderate sedation arranged by anesthesia and a more controlled environment although this is highly unusual but probably could be arranged.  Recheck in 1 month.   PDMP not reviewed this  encounter. Orders Placed This Encounter  Procedures  . [place] LIMITED JOINT SPACE STRUCTURES UP LEFT(NO LINKED CHARGES)    Order Specific Question:   Reason for Exam (SYMPTOM  OR DIAGNOSIS REQUIRED)    Answer:   L elbow pain    Order Specific Question:   Preferred  imaging location?    Answer:   [place]   No orders of the defined types were placed in this encounter.    Discussed warning signs or symptoms. Please see discharge instructions. Patient expresses understanding.   The above documentation has been reviewed and is accurate and complete Amber Hicks, M.D.

## 2021-01-29 ENCOUNTER — Other Ambulatory Visit: Payer: Self-pay

## 2021-01-29 ENCOUNTER — Ambulatory Visit (INDEPENDENT_AMBULATORY_CARE_PROVIDER_SITE_OTHER): Payer: Medicare Other | Admitting: Family Medicine

## 2021-01-29 ENCOUNTER — Ambulatory Visit: Payer: Self-pay

## 2021-01-29 ENCOUNTER — Encounter: Payer: Self-pay | Admitting: Family Medicine

## 2021-01-29 VITALS — BP 120/72 | HR 78 | Ht 65.0 in | Wt 158.6 lb

## 2021-01-29 DIAGNOSIS — M25522 Pain in left elbow: Secondary | ICD-10-CM | POA: Diagnosis not present

## 2021-01-29 DIAGNOSIS — M7712 Lateral epicondylitis, left elbow: Secondary | ICD-10-CM | POA: Diagnosis not present

## 2021-01-29 NOTE — Patient Instructions (Addendum)
Good to see you today.    You had a L elbow injection today.  Call or go to the ER if you develop a large red swollen joint with extreme pain or oozing puss.   Follow-up in one month.

## 2021-01-30 ENCOUNTER — Encounter: Payer: Self-pay | Admitting: Rehabilitative and Restorative Service Providers"

## 2021-01-30 ENCOUNTER — Other Ambulatory Visit: Payer: Self-pay

## 2021-01-30 ENCOUNTER — Ambulatory Visit (INDEPENDENT_AMBULATORY_CARE_PROVIDER_SITE_OTHER): Payer: Medicare Other | Admitting: Rehabilitative and Restorative Service Providers"

## 2021-01-30 DIAGNOSIS — M25671 Stiffness of right ankle, not elsewhere classified: Secondary | ICD-10-CM

## 2021-01-30 DIAGNOSIS — R262 Difficulty in walking, not elsewhere classified: Secondary | ICD-10-CM | POA: Diagnosis not present

## 2021-01-30 DIAGNOSIS — R600 Localized edema: Secondary | ICD-10-CM | POA: Diagnosis not present

## 2021-01-30 DIAGNOSIS — M6281 Muscle weakness (generalized): Secondary | ICD-10-CM

## 2021-01-30 DIAGNOSIS — M25571 Pain in right ankle and joints of right foot: Secondary | ICD-10-CM | POA: Diagnosis not present
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
## 2021-02-06 ENCOUNTER — Other Ambulatory Visit: Payer: Self-pay

## 2021-02-06 ENCOUNTER — Ambulatory Visit (INDEPENDENT_AMBULATORY_CARE_PROVIDER_SITE_OTHER): Payer: Medicare Other | Admitting: Rehabilitative and Restorative Service Providers"

## 2021-02-06 ENCOUNTER — Encounter: Payer: Self-pay | Admitting: Rehabilitative and Restorative Service Providers"

## 2021-02-06 DIAGNOSIS — R262 Difficulty in walking, not elsewhere classified: Secondary | ICD-10-CM | POA: Diagnosis not present

## 2021-02-06 DIAGNOSIS — M25671 Stiffness of right ankle, not elsewhere classified: Secondary | ICD-10-CM

## 2021-02-06 DIAGNOSIS — M6281 Muscle weakness (generalized): Secondary | ICD-10-CM | POA: Diagnosis not present

## 2021-02-06 DIAGNOSIS — R600 Localized edema: Secondary | ICD-10-CM | POA: Diagnosis not present

## 2021-02-06 DIAGNOSIS — M25571 Pain in right ankle and joints of right foot: Secondary | ICD-10-CM
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
## 2021-02-13 ENCOUNTER — Encounter: Payer: Medicare Other | Admitting: Rehabilitative and Restorative Service Providers"

## 2021-02-13 ENCOUNTER — Telehealth: Payer: Self-pay | Admitting: Rehabilitative and Restorative Service Providers"

## 2021-02-13 NOTE — Telephone Encounter (Signed)
Spoke to [place].  She forgot today's appointment and I will see her next Thursday at 1:45 PM.

## 2021-02-20 ENCOUNTER — Encounter: Payer: Self-pay | Admitting: Rehabilitative and Restorative Service Providers"

## 2021-02-20 ENCOUNTER — Other Ambulatory Visit: Payer: Self-pay

## 2021-02-20 ENCOUNTER — Ambulatory Visit (INDEPENDENT_AMBULATORY_CARE_PROVIDER_SITE_OTHER): Payer: Medicare Other | Admitting: Rehabilitative and Restorative Service Providers"

## 2021-02-20 DIAGNOSIS — M25671 Stiffness of right ankle, not elsewhere classified: Secondary | ICD-10-CM

## 2021-02-20 DIAGNOSIS — R600 Localized edema: Secondary | ICD-10-CM | POA: Diagnosis not present

## 2021-02-20 DIAGNOSIS — M25571 Pain in right ankle and joints of right foot: Secondary | ICD-10-CM

## 2021-02-20 DIAGNOSIS — R262 Difficulty in walking, not elsewhere classified: Secondary | ICD-10-CM

## 2021-02-20 DIAGNOSIS — M6281 Muscle weakness (generalized): Secondary | ICD-10-CM
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
## 2021-02-27 ENCOUNTER — Ambulatory Visit (INDEPENDENT_AMBULATORY_CARE_PROVIDER_SITE_OTHER): Payer: Medicare Other | Admitting: Rehabilitative and Restorative Service Providers"

## 2021-02-27 ENCOUNTER — Encounter: Payer: Self-pay | Admitting: Rehabilitative and Restorative Service Providers"

## 2021-02-27 ENCOUNTER — Other Ambulatory Visit: Payer: Self-pay

## 2021-02-27 DIAGNOSIS — R262 Difficulty in walking, not elsewhere classified: Secondary | ICD-10-CM | POA: Diagnosis not present

## 2021-02-27 DIAGNOSIS — R600 Localized edema: Secondary | ICD-10-CM

## 2021-02-27 DIAGNOSIS — M25671 Stiffness of right ankle, not elsewhere classified: Secondary | ICD-10-CM | POA: Diagnosis not present

## 2021-02-27 DIAGNOSIS — M6281 Muscle weakness (generalized): Secondary | ICD-10-CM | POA: Diagnosis not present

## 2021-02-27 DIAGNOSIS — M25571 Pain in right ankle and joints of right foot: Secondary | ICD-10-CM
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
## 2021-03-03 ENCOUNTER — Other Ambulatory Visit: Payer: Self-pay

## 2021-03-03 ENCOUNTER — Ambulatory Visit (INDEPENDENT_AMBULATORY_CARE_PROVIDER_SITE_OTHER): Payer: Medicare Other | Admitting: Family Medicine

## 2021-03-03 ENCOUNTER — Encounter: Payer: Self-pay | Admitting: Family Medicine

## 2021-03-03 VITALS — BP 124/76 | HR 78 | Ht 65.0 in | Wt 161.0 lb

## 2021-03-03 DIAGNOSIS — M25571 Pain in right ankle and joints of right foot: Secondary | ICD-10-CM | POA: Diagnosis not present

## 2021-03-03 DIAGNOSIS — G8929 Other chronic pain: Secondary | ICD-10-CM

## 2021-03-03 NOTE — Patient Instructions (Signed)
Thank you for coming in today.  Finish out PT.   Work hard on your own and stay active.   Recheck in 3 months.   If not better there is another test I can do.  It is a 3 phase bone scan to look for complex regional pain syndrome.   I can also do an injection.

## 2021-03-06 ENCOUNTER — Telehealth: Payer: Self-pay | Admitting: Rehabilitative and Restorative Service Providers"

## 2021-03-06 ENCOUNTER — Encounter: Payer: Medicare Other | Admitting: Rehabilitative and Restorative Service Providers"

## 2021-03-06 NOTE — Telephone Encounter (Signed)
Reminded Hamda of her next PT appointment 6/16 (next Thursday) at 1:45.  Reports she has been doing HEP and forgot about today's appointment.

## 2021-03-13 ENCOUNTER — Encounter: Payer: Self-pay | Admitting: Rehabilitative and Restorative Service Providers"

## 2021-03-13 ENCOUNTER — Other Ambulatory Visit: Payer: Self-pay

## 2021-03-13 ENCOUNTER — Ambulatory Visit (INDEPENDENT_AMBULATORY_CARE_PROVIDER_SITE_OTHER): Payer: Medicare Other | Admitting: Rehabilitative and Restorative Service Providers"

## 2021-03-13 DIAGNOSIS — R262 Difficulty in walking, not elsewhere classified: Secondary | ICD-10-CM | POA: Diagnosis not present

## 2021-03-13 DIAGNOSIS — R600 Localized edema: Secondary | ICD-10-CM | POA: Diagnosis not present

## 2021-03-13 DIAGNOSIS — M25571 Pain in right ankle and joints of right foot: Secondary | ICD-10-CM

## 2021-03-13 DIAGNOSIS — M25671 Stiffness of right ankle, not elsewhere classified: Secondary | ICD-10-CM | POA: Diagnosis not present

## 2021-03-13 DIAGNOSIS — M6281 Muscle weakness (generalized): Secondary | ICD-10-CM
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
## 2021-03-20 ENCOUNTER — Encounter: Payer: Medicare Other | Admitting: Rehabilitative and Restorative Service Providers"

## 2021-03-27 ENCOUNTER — Other Ambulatory Visit: Payer: Self-pay

## 2021-03-27 ENCOUNTER — Encounter: Payer: Self-pay | Admitting: Rehabilitative and Restorative Service Providers"

## 2021-03-27 ENCOUNTER — Ambulatory Visit (INDEPENDENT_AMBULATORY_CARE_PROVIDER_SITE_OTHER): Payer: Medicare Other | Admitting: Rehabilitative and Restorative Service Providers"

## 2021-03-27 DIAGNOSIS — M25671 Stiffness of right ankle, not elsewhere classified: Secondary | ICD-10-CM

## 2021-03-27 DIAGNOSIS — M6281 Muscle weakness (generalized): Secondary | ICD-10-CM

## 2021-03-27 DIAGNOSIS — R262 Difficulty in walking, not elsewhere classified: Secondary | ICD-10-CM

## 2021-03-27 DIAGNOSIS — R600 Localized edema: Secondary | ICD-10-CM | POA: Diagnosis not present

## 2021-03-27 NOTE — Patient Instructions (Signed)
Updated HEP for DC to independent rehabilitation.
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
## 2021-04-16 ENCOUNTER — Ambulatory Visit: Payer: Medicare Other

## 2021-04-25 ENCOUNTER — Telehealth: Payer: Self-pay

## 2021-04-25 NOTE — Telephone Encounter (Signed)
pts daughter called stating that pt is moving into a smaller assisted living apartment and they are needing the pts provider to sign the form's that will be faxed over stating pts mental health need for her dog to be with her as her companion as pts husband has recently passed away and pt does suffer depression, etc.

## 2021-04-28 NOTE — Telephone Encounter (Signed)
Ok I think this should be ok to do, thanks

## 2021-05-01 NOTE — Telephone Encounter (Signed)
Form faxed to [facility] [phone]

## 2021-05-07 NOTE — Telephone Encounter (Signed)
error 

## 2021-06-03 ENCOUNTER — Other Ambulatory Visit: Payer: Self-pay

## 2021-06-03 ENCOUNTER — Ambulatory Visit (INDEPENDENT_AMBULATORY_CARE_PROVIDER_SITE_OTHER): Payer: Medicare Other | Admitting: Family Medicine

## 2021-06-03 VITALS — BP 138/88 | HR 75 | Ht 65.0 in | Wt 160.8 lb

## 2021-06-03 DIAGNOSIS — M76899 Other specified enthesopathies of unspecified lower limb, excluding foot: Secondary | ICD-10-CM | POA: Diagnosis not present

## 2021-06-03 DIAGNOSIS — G8929 Other chronic pain: Secondary | ICD-10-CM

## 2021-06-03 DIAGNOSIS — M7062 Trochanteric bursitis, left hip: Secondary | ICD-10-CM | POA: Diagnosis not present

## 2021-06-03 DIAGNOSIS — M25571 Pain in right ankle and joints of right foot: Secondary | ICD-10-CM | POA: Diagnosis not present

## 2021-06-03 NOTE — Patient Instructions (Signed)
Thank you for coming in today.   Plan for home health PT.  I've referred you to Physical Therapy.  Let us know if you don't hear from them in one week.   Complete the [employer] bus application.   Return in 1 month.

## 2021-06-03 NOTE — Progress Notes (Signed)
I, Wendy Poet, LAT, ATC, am serving as scribe for Dr. Lynne Leader.  Amber Hicks is a 70 y.o. female who presents to [place] at [facility] today for L leg pain and follow-up of L lateral epicondylitis and R ankle pain.  She was last seen by Dr. Georgina Snell on 03/03/21 and noted that her R ankle was feeling worse and her L elbow was improving.  She has completed 7 PT sessions and was d/c from PT. Today, pt reports R ankle is still achy. Pt c/o the L leg "locking up" on her when leaving church on Sunday. Pt locates pain to the posterior aspect of L thigh and radiates to the anterior aspect.  Low back pain: yes- L-side LE numbness/tingling: no   Diagnostic testing: L elbow XR- 01/23/21, 01/13/21; R ankle MRI- 10/20/20;  R ankle XR- 10/04/20, 07/16/20 and 06/25/20  Pertinent review of systems: No fevers or chills  Relevant historical information: Her husband died in 13-Apr-2023 and she has been doing a lot of activities recently including cleaning out the house in the backyard. She has trouble with transportation relying on her son who is the power of attorney and her neighbor to help her get around and get to doctors appointments.  She does not drive.  She has a history of a prior head injury which causes some confusion and makes navigation challenging.  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   Exam:  BP 138/88   Pulse 75   Ht '5\' 5"'$  (1.651 m)   Wt 160 lb 12.8 oz (72.9 kg)   SpO2 98%   BMI 26.76 kg/m  General: Well Developed, well nourished, and in no acute distress.   MSK: L-spine nontender midline.  Decreased lumbar motion. Left hip normal-appearing Negative left-sided slump test. Normal motion. Tender to palpation greater trochanter and ischial tuberosity. Strength 4/5 hip abduction and hip extension both with pain. Strip strength otherwise intact. Able to reproduce posterior thigh pain with resisted hip extension and knee flexion.  Reflexes  intact distally.  Right ankle normal-appearing mildly tender palpation decreased ankle motion.    Lab and Radiology Results   EXAM: MRI OF THE RIGHT ANKLE WITHOUT CONTRAST   TECHNIQUE: Multiplanar, multisequence MR imaging of the ankle was performed. No intravenous contrast was administered.   COMPARISON:  X-ray 10/04/2020   FINDINGS: TENDONS   Peroneal: Intact peroneus longus and peroneus brevis tendons.   Posteromedial: Intact tibialis posterior, flexor hallucis longus and flexor digitorum longus tendons. Small volume tenosynovial fluid within the FHL tendon sheath as it traverses the tibiotalar joint.   Anterior: Intact tibialis anterior, extensor hallucis longus and extensor digitorum longus tendons.   Achilles: Intact.   Plantar Fascia: Intact.   LIGAMENTS   Lateral: The anterior and posterior tibiofibular ligaments are intact. The anterior and posterior talofibular ligaments are intact. Intact calcaneofibular ligament.   Medial: Deltoid ligament and spring ligament complex intact.   CARTILAGE   Ankle Joint: No joint effusion or chondral defect.   Subtalar Joints/Sinus Tarsi: No joint effusion or chondral defect. Preservation of the anatomic fat within the sinus tarsi.   Bones: No acute fracture. No malalignment. 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 Osseous structures otherwise within normal limits.   Other: No soft tissue edema or fluid collection.   IMPRESSION: 1. No acute abnormality. No findings to explain patient's lateral ankle pain. 2. 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 3. Small volume tenosynovial fluid within the FHL tendon sheath.     Electronically Signed   By: Davina Poke D.O.   On: 10/20/2020 15:10   I, Lynne Leader, personally (independently) visualized and performed the interpretation of the images attached in this note.     Assessment and  Plan: 70 y.o. female with left posterior thigh pain and lateral hip pain primary issue today.  Pain but thought to be due to muscle dysfunction strains and weakness mostly at the hamstring and hip abductors.  She denies any fall or acute injury.  She has been increasing her activity recently to explain the cause of pain.  Plan for trial of home health PT and recheck in about 6 weeks.  Right ankle pain chronic ongoing issue.  Again she is a great candidate for home health physical therapy.  Home health PT needed due to poor transportation.  Additionally provided application form and filled out my set of the application for the [employer] bus.   PDMP not reviewed this encounter. Orders Placed This Encounter  Procedures   Ambulatory referral to Home Health    Referral Priority:   Routine    Referral Type:   Home Health Care    Referral Reason:   Specialty Services Required    Requested Specialty:   [place]    Number of Visits Requested:   1   No orders of the defined types were placed in this encounter.    Discussed warning signs or symptoms. Please see discharge instructions. Patient expresses understanding.   The above documentation has been reviewed and is accurate and complete Lynne Leader, M.D. Total encounter time 40 minutes including face-to-face time with the patient and, reviewing past medical record, and charting on the date of service.   Lengthy discussion regarding transportation needs as well as evaluating treating and discussing the above medical problems.

## 2021-06-12 DIAGNOSIS — S76311A Strain of muscle, fascia and tendon of the posterior muscle group at thigh level, right thigh, initial encounter: Secondary | ICD-10-CM | POA: Diagnosis not present

## 2021-06-12 DIAGNOSIS — H6091 Unspecified otitis externa, right ear: Secondary | ICD-10-CM | POA: Diagnosis not present

## 2021-06-12 DIAGNOSIS — I700 Atherosclerosis of aorta: Secondary | ICD-10-CM | POA: Diagnosis not present

## 2021-06-12 DIAGNOSIS — M25571 Pain in right ankle and joints of right foot: Secondary | ICD-10-CM | POA: Diagnosis not present

## 2021-06-12 DIAGNOSIS — M810 Age-related osteoporosis without current pathological fracture: Secondary | ICD-10-CM | POA: Diagnosis not present

## 2021-06-12 DIAGNOSIS — G8929 Other chronic pain: Secondary | ICD-10-CM | POA: Diagnosis not present

## 2021-06-16 ENCOUNTER — Other Ambulatory Visit: Payer: Self-pay | Admitting: Internal Medicine

## 2021-06-16 DIAGNOSIS — Z1231 Encounter for screening mammogram for malignant neoplasm of breast: Secondary | ICD-10-CM

## 2021-06-18 DIAGNOSIS — H6091 Unspecified otitis externa, right ear: Secondary | ICD-10-CM | POA: Diagnosis not present

## 2021-06-18 DIAGNOSIS — M25571 Pain in right ankle and joints of right foot: Secondary | ICD-10-CM | POA: Diagnosis not present

## 2021-06-18 DIAGNOSIS — I700 Atherosclerosis of aorta: Secondary | ICD-10-CM | POA: Diagnosis not present

## 2021-06-18 DIAGNOSIS — M810 Age-related osteoporosis without current pathological fracture: Secondary | ICD-10-CM | POA: Diagnosis not present

## 2021-06-18 DIAGNOSIS — G8929 Other chronic pain: Secondary | ICD-10-CM | POA: Diagnosis not present

## 2021-06-18 DIAGNOSIS — S76311A Strain of muscle, fascia and tendon of the posterior muscle group at thigh level, right thigh, initial encounter: Secondary | ICD-10-CM | POA: Diagnosis not present

## 2021-06-19 DIAGNOSIS — S76311A Strain of muscle, fascia and tendon of the posterior muscle group at thigh level, right thigh, initial encounter: Secondary | ICD-10-CM | POA: Diagnosis not present

## 2021-06-19 DIAGNOSIS — M25571 Pain in right ankle and joints of right foot: Secondary | ICD-10-CM | POA: Diagnosis not present

## 2021-06-19 DIAGNOSIS — G8929 Other chronic pain: Secondary | ICD-10-CM | POA: Diagnosis not present

## 2021-06-19 DIAGNOSIS — H6091 Unspecified otitis externa, right ear: Secondary | ICD-10-CM | POA: Diagnosis not present

## 2021-06-19 DIAGNOSIS — M810 Age-related osteoporosis without current pathological fracture: Secondary | ICD-10-CM | POA: Diagnosis not present

## 2021-06-19 DIAGNOSIS — I700 Atherosclerosis of aorta: Secondary | ICD-10-CM | POA: Diagnosis not present

## 2021-06-20 ENCOUNTER — Other Ambulatory Visit: Payer: Self-pay

## 2021-06-20 ENCOUNTER — Ambulatory Visit
Admission: RE | Admit: 2021-06-20 | Discharge: 2021-06-20 | Disposition: A | Discharge status: Home / Self Care | Payer: Medicare Other | Source: Ambulatory Visit | Attending: Internal Medicine | Admitting: Internal Medicine

## 2021-06-20 DIAGNOSIS — Z1231 Encounter for screening mammogram for malignant neoplasm of breast: Secondary | ICD-10-CM | POA: Diagnosis not present

## 2021-06-20 IMAGING — MG MM DIGITAL SCREENING BILAT W/ TOMO AND CAD
8 series · 9 of 24 positions shown · non-contrast
Comparison: Previous exam(s).

CLINICAL DATA: Screening.

EXAM:
DIGITAL SCREENING BILATERAL MAMMOGRAM WITH TOMOSYNTHESIS AND CAD
TECHNIQUE: Bilateral screening digital craniocaudal and mediolateral oblique
mammograms were obtained. Bilateral screening digital breast
tomosynthesis was performed. The images were evaluated with
computer-aided detection.

[R MLO synth-2D]
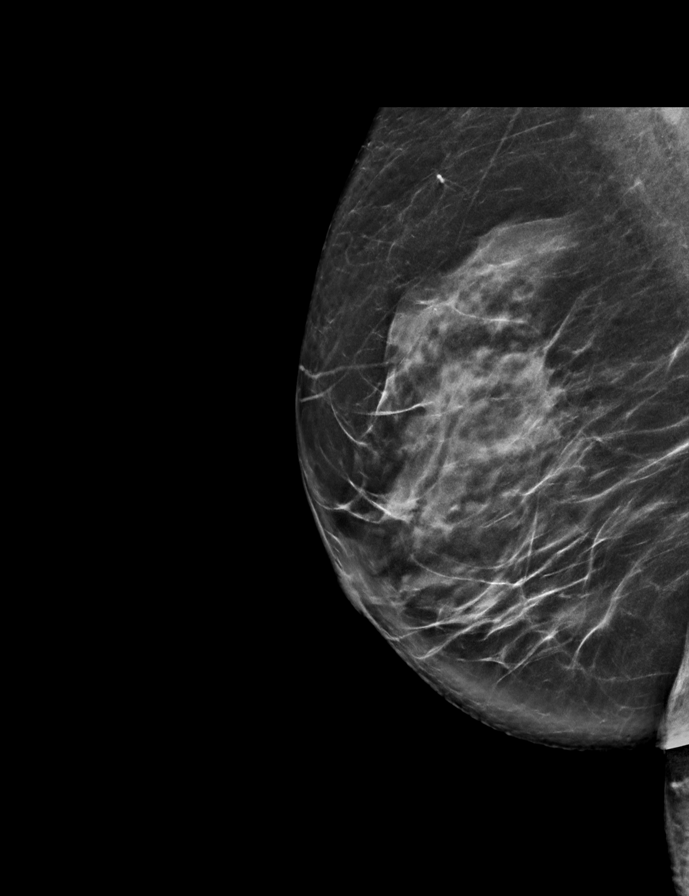

[L CC synth-2D]
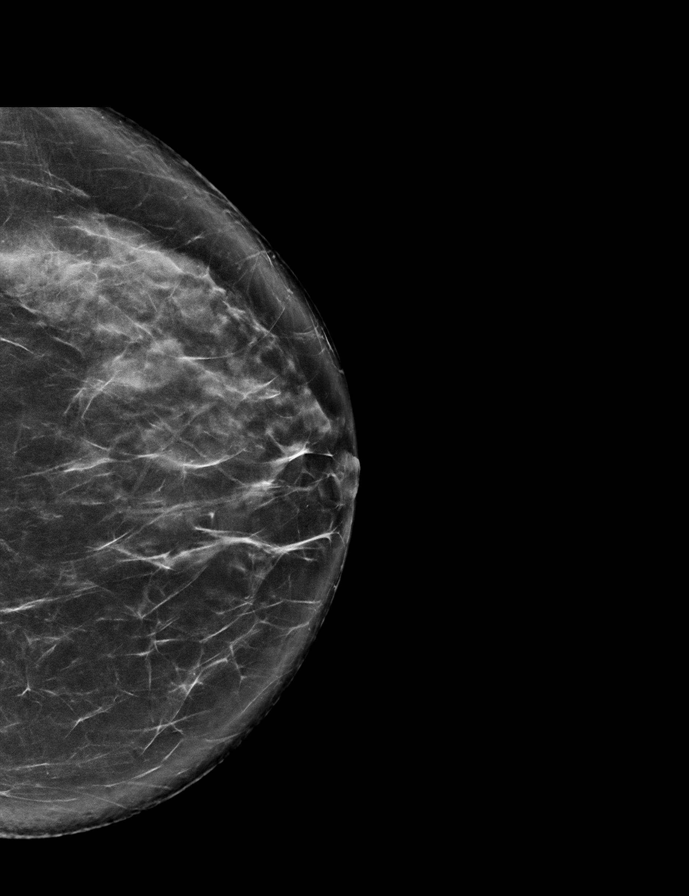

[L MLO synth-2D]
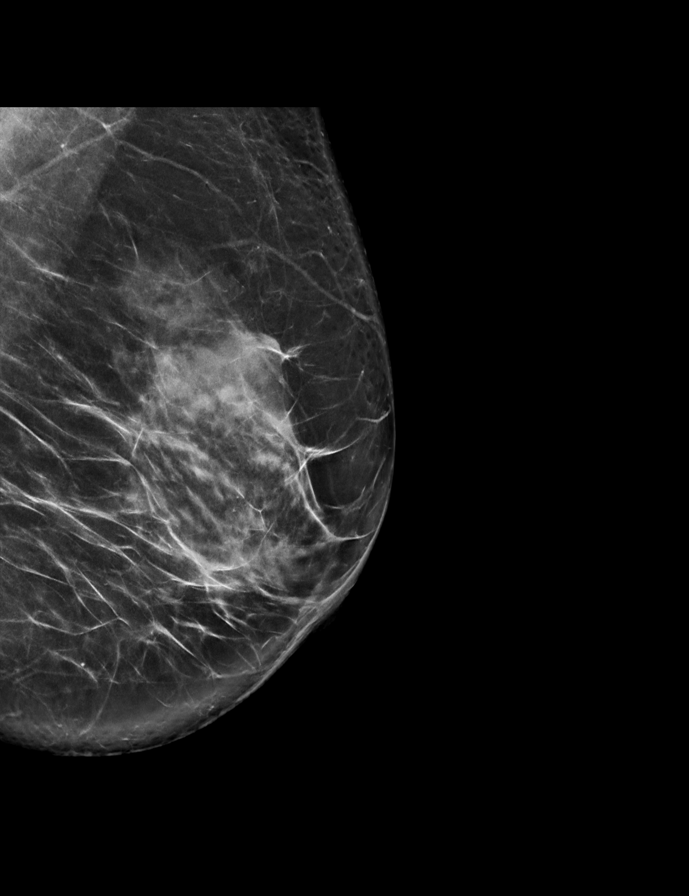

[R CC synth-2D]
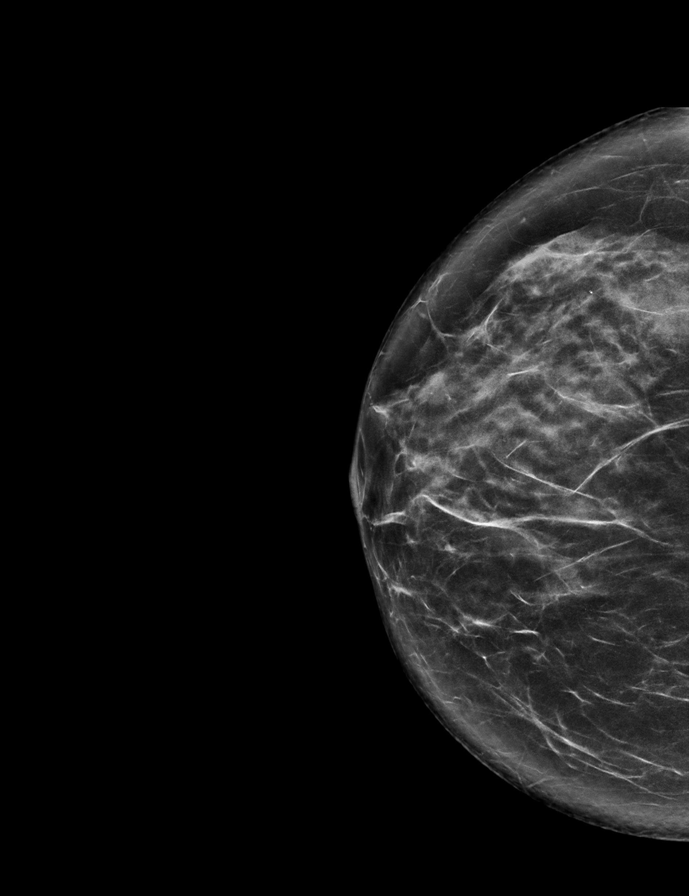

[L CC tomo · 2 of 78 frames shown]
[frame 26/78]
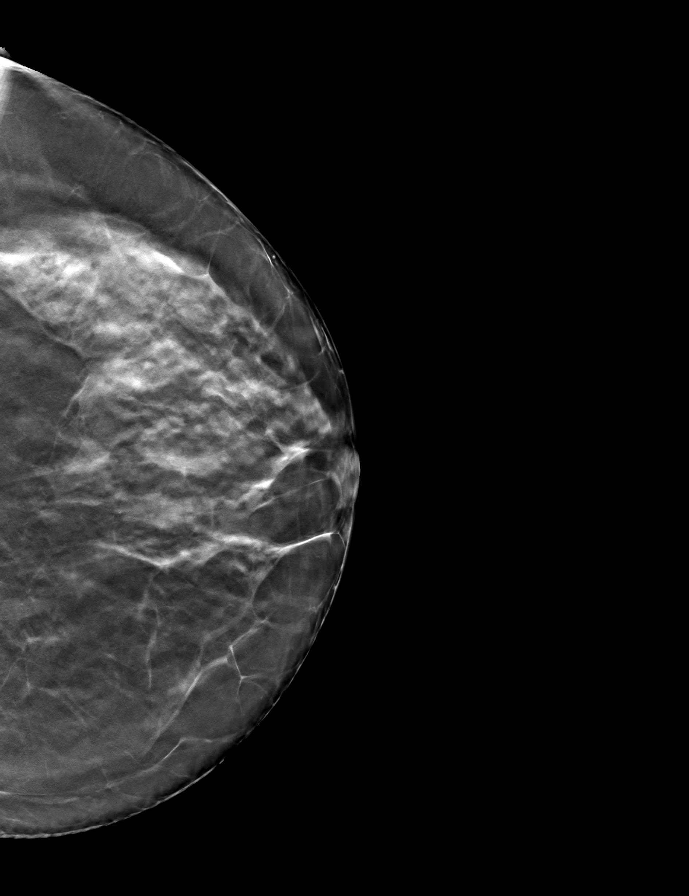
[frame 39/78]
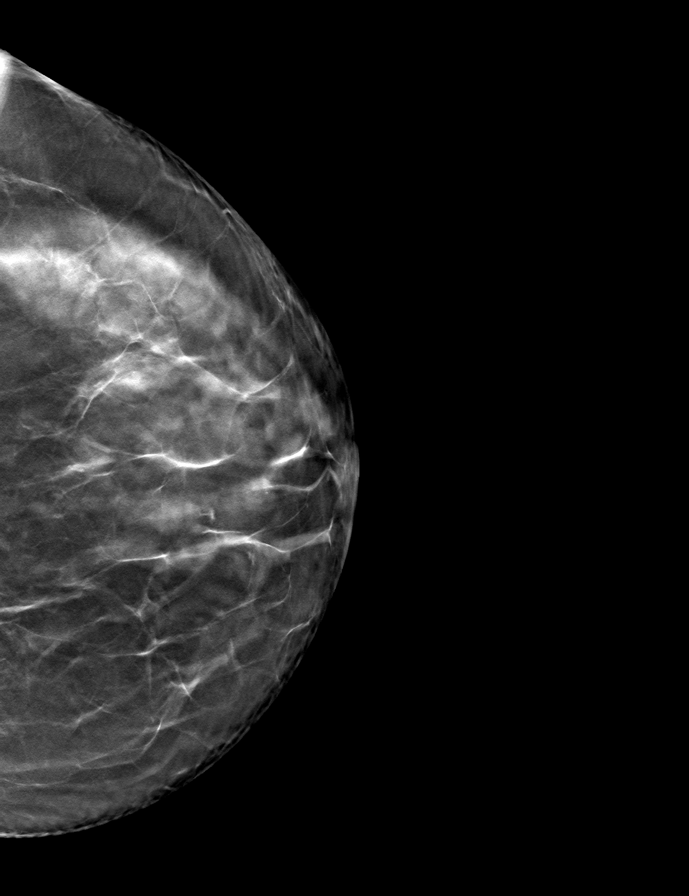

[R MLO tomo · tomo slice 41/82.0]
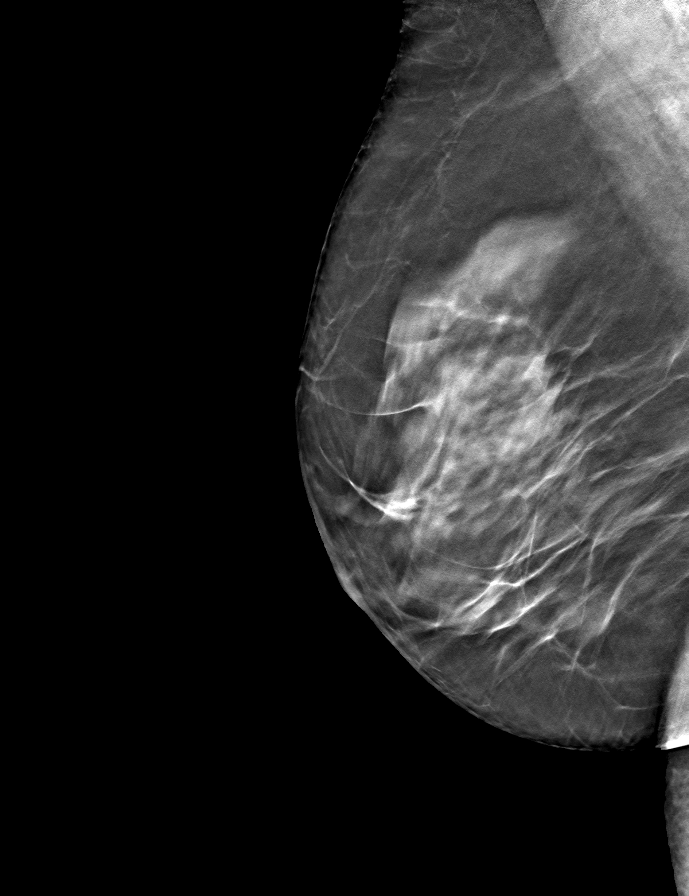

[L MLO tomo · tomo slice 42/83.0]
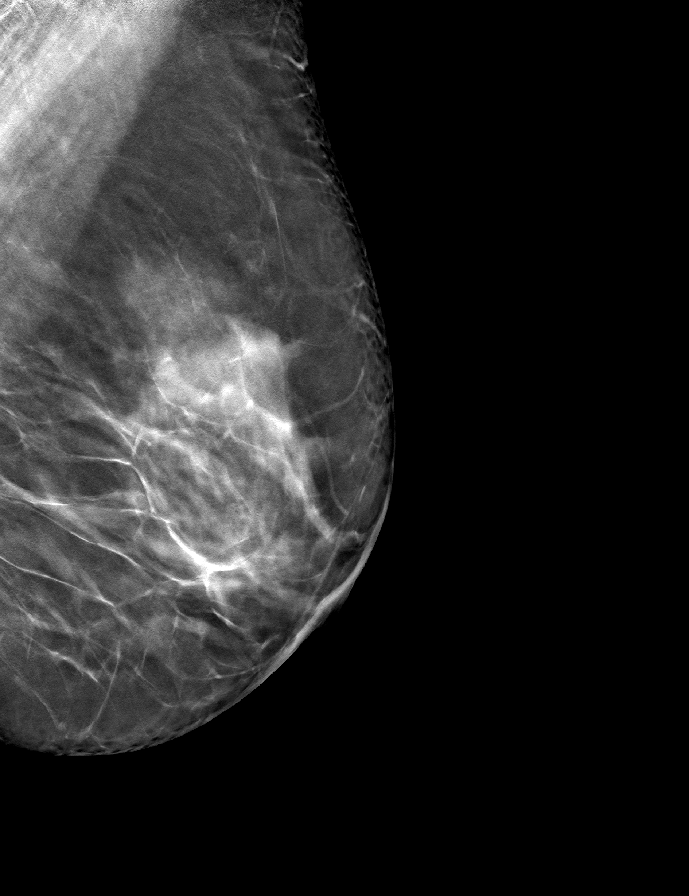

[R CC tomo · tomo slice 37/73.0]
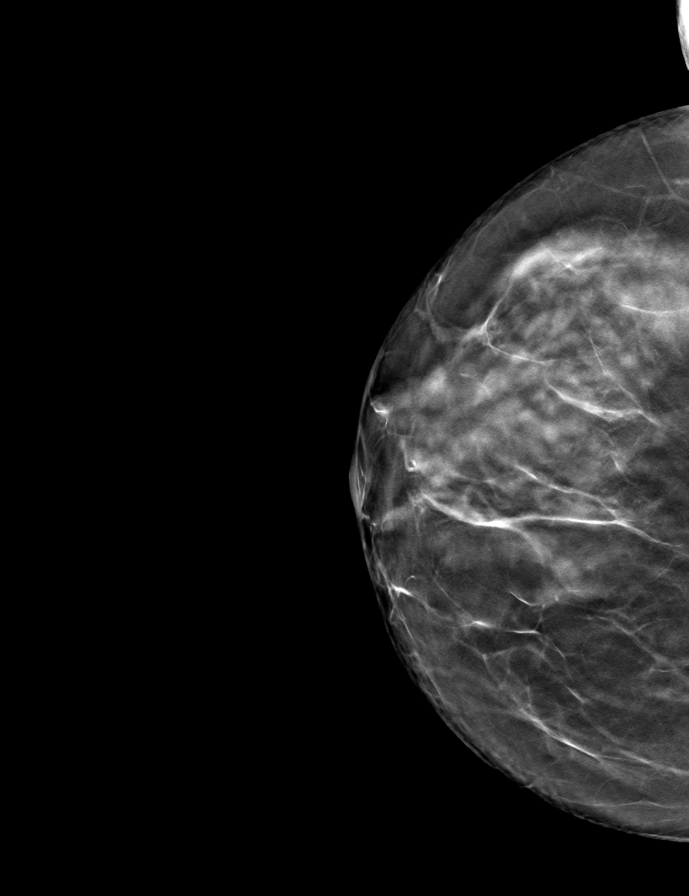

[9 of 24 positions shown; findings below may reference images not displayed]

ACR Breast Density Category c: The breast tissue is heterogeneously
dense, which may obscure small masses.
FINDINGS: There are no findings suspicious for malignancy.
IMPRESSION: No mammographic evidence of malignancy. A result letter of this
screening mammogram will be mailed directly to the patient.

RECOMMENDATION:
Screening mammogram in one year. (Code:[V2])

BI-RADS CATEGORY  1: Negative.

## 2021-07-01 ENCOUNTER — Other Ambulatory Visit: Payer: Self-pay

## 2021-07-01 ENCOUNTER — Ambulatory Visit (INDEPENDENT_AMBULATORY_CARE_PROVIDER_SITE_OTHER): Payer: Medicare Other | Admitting: Family Medicine

## 2021-07-01 ENCOUNTER — Telehealth: Payer: Self-pay | Admitting: Internal Medicine

## 2021-07-01 VITALS — BP 130/80 | HR 73 | Ht 65.0 in | Wt 160.0 lb

## 2021-07-01 DIAGNOSIS — G8929 Other chronic pain: Secondary | ICD-10-CM

## 2021-07-01 DIAGNOSIS — Z659 Problem related to unspecified psychosocial circumstances: Secondary | ICD-10-CM | POA: Diagnosis not present

## 2021-07-01 DIAGNOSIS — M25571 Pain in right ankle and joints of right foot: Secondary | ICD-10-CM

## 2021-07-01 NOTE — Telephone Encounter (Signed)
-----  Message from Gregor Hams, MD sent at 07/01/2021 10:44 AM EDT ----- Regarding: Not doing well. Question guardionship Hello Dr. Jenny Reichmann,  I have seen Chrys multiple times for her orthopedic issues over the years.  Her husband died a few months ago and I am concerned about her ability to manage independently.  Please see my most recent office note.  I do not know what the next step should be but she may benefit from a guardianship with her son if we can get him involved.  I have never met her son so I am not sure he is a good option.   Ellard Artis

## 2021-07-01 NOTE — Patient Instructions (Addendum)
Thank you for coming in today.   Try calling [facility] and see if you can figure out what is happening with your home health PT.  [phone]  You can figure whats happening with your [employer] bus application [employer] Access Go [phone]  Recheck back in 1 month.

## 2021-07-01 NOTE — Progress Notes (Signed)
I, Amber Hicks, LAT, ATC acting as a scribe for Amber Leader, MD.  Amber Hicks is a 70 y.o. female who presents to [place] at [facility] today for f/u L posterior thigh, lateral hip, and chronic R ankle pain. Pt was last seen by Amber Hicks on 06/03/21 and was referred to home health PT. Today, pt reports R ankle is the same, swollen and hurt intermittently, esp when taking a bath at night. Pt c/o L hip pain along the lateral aspect. No numbness/tingling noted. Pt reports that home health PT was supposed to come out on Monday, however she never arrived.  Additionally she was provided with the application for [employer] at the last visit.  She sent the application and but has not heard anything back yet.  Of note Amber Hicks does have a phone but it does not have voicemail.  Dx imaging: 10/20/20 R ankle MRI  10/04/20 R ankle XR  07/16/20 R ankle XR  06/25/20 R ankle XR  Pertinent review of systems: No fevers or chills  Relevant historical information: Amber Hicks's husband died a few months ago.  She does have a son that lives nearby that helps some.  She has a history of a head injury in the past with some concern for her ability to function independently.  She does get help from her neighbor.   Exam:  BP 130/80   Pulse 73   Ht 5\' 5"  (1.651 m)   Wt 160 lb (72.6 kg)   SpO2 97%   BMI 26.63 kg/m  General: Well Developed, well nourished, and in no acute distress.   MSK: Right ankle normal-appearing Normal motion. Tender palpation lateral ankle. Antalgic gait.  Neuropsych: Alert and oriented normal speech and thought process.    Lab and Radiology Results EXAM: MRI OF THE RIGHT ANKLE WITHOUT CONTRAST   TECHNIQUE: Multiplanar, multisequence MR imaging of the ankle was performed. No intravenous contrast was administered.   COMPARISON:  X-ray 10/04/2020   FINDINGS: TENDONS   Peroneal: Intact peroneus longus and peroneus brevis tendons.    Posteromedial: Intact tibialis posterior, flexor hallucis longus and flexor digitorum longus tendons. Small volume tenosynovial fluid within the FHL tendon sheath as it traverses the tibiotalar joint.   Anterior: Intact tibialis anterior, extensor hallucis longus and extensor digitorum longus tendons.   Achilles: Intact.   Plantar Fascia: Intact.   LIGAMENTS   Lateral: The anterior and posterior tibiofibular ligaments are intact. The anterior and posterior talofibular ligaments are intact. Intact calcaneofibular ligament.   Medial: Deltoid ligament and spring ligament complex intact.   CARTILAGE   Ankle Joint: No joint effusion or chondral defect.   Subtalar Joints/Sinus Tarsi: No joint effusion or chondral defect. Preservation of the anatomic fat within the sinus tarsi.   Bones: No acute fracture. No malalignment. 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 Osseous structures otherwise within normal limits.   Other: No soft tissue edema or fluid collection.   IMPRESSION: 1. No acute abnormality. No findings to explain patient's lateral ankle pain. 2. 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 3. Small volume tenosynovial fluid within the FHL tendon sheath.     Electronically Signed   By: Davina Poke D.O.   On: 10/20/2020 15:10   I, Amber Hicks, personally (independently) visualized and performed the interpretation of the images attached in this note.      Assessment and Plan: 70 y.o. female with  right ankle pain. Fundamentally were in a similar position to last month's visit.  She eventually would benefit from home health physical therapy.  It sounds like there is been some traction with home health physical therapy with Amber Hicks but there is been a lot of follow-through. Amber Hicks is not a very good historian and I think she is very difficult to communicate with from healthcare agencies  because she does not have a voicemail and is inconsistent about answering her phone.  I think it is quite likely that she has been contacted but not returned correspondence.  Regardless I provided her with the contact information for [facility] and recommend that she contact them to figure out what is going on with home health agency.  Happy to help in any way I can.  Additionally she would benefit significantly from improved transportation with [employer] but again I am worried about her ability to communicate with disorganization successfully to complete the application process.  Again I provided her with the contact information to call and inquire about her application.  I printed her the application again as well as the part that I have completed.  Improved transportation would be essential for her ability to manage her health affairs independently.  On a more global perspective I am worried about Amber Hicks's ability to manage her affairs independently.  I think she has been relying on her husband a lot and with his passing is managing with the assistance of her son who is around sometimes and the assistance of her neighbor who is there quite a bit.  However I think with stressors this arrangement could easily fall apart.  I will discuss this with her primary care provider.  She may benefit from a family meeting with her son and potentially may benefit from a guardianship.   As for the ankle proceed to home health physical therapy and recheck with me in 1 month.    Discussed warning signs or symptoms. Please see discharge instructions. Patient expresses understanding.   The above documentation has been reviewed and is accurate and complete Amber Hicks, M.D.  Total encounter time 40 minutes including face-to-face time with the patient and, reviewing past medical record, and charting on the date of service.

## 2021-07-01 NOTE — Telephone Encounter (Signed)
Please to contact pt and/or son  Pt struggling at home  Please see if she would make ROV with son if he would want to be involved in her care  thanks

## 2021-07-01 NOTE — Telephone Encounter (Signed)
Left message for son to call our office back to schedule appointment.

## 2021-07-03 DIAGNOSIS — G8929 Other chronic pain: Secondary | ICD-10-CM | POA: Diagnosis not present

## 2021-07-03 DIAGNOSIS — R42 Dizziness and giddiness: Secondary | ICD-10-CM | POA: Diagnosis not present

## 2021-07-03 DIAGNOSIS — S76312D Strain of muscle, fascia and tendon of the posterior muscle group at thigh level, left thigh, subsequent encounter: Secondary | ICD-10-CM | POA: Diagnosis not present

## 2021-07-03 DIAGNOSIS — R7301 Impaired fasting glucose: Secondary | ICD-10-CM | POA: Diagnosis not present

## 2021-07-03 DIAGNOSIS — E7800 Pure hypercholesterolemia, unspecified: Secondary | ICD-10-CM | POA: Diagnosis not present

## 2021-07-03 DIAGNOSIS — E559 Vitamin D deficiency, unspecified: Secondary | ICD-10-CM | POA: Diagnosis not present

## 2021-07-03 DIAGNOSIS — K219 Gastro-esophageal reflux disease without esophagitis: Secondary | ICD-10-CM | POA: Diagnosis not present

## 2021-07-03 DIAGNOSIS — M7062 Trochanteric bursitis, left hip: Secondary | ICD-10-CM | POA: Diagnosis not present

## 2021-07-03 DIAGNOSIS — M25571 Pain in right ankle and joints of right foot: Secondary | ICD-10-CM | POA: Diagnosis not present

## 2021-07-03 DIAGNOSIS — M810 Age-related osteoporosis without current pathological fracture: Secondary | ICD-10-CM | POA: Diagnosis not present

## 2021-07-07 DIAGNOSIS — M810 Age-related osteoporosis without current pathological fracture: Secondary | ICD-10-CM | POA: Diagnosis not present

## 2021-07-07 DIAGNOSIS — S76312D Strain of muscle, fascia and tendon of the posterior muscle group at thigh level, left thigh, subsequent encounter: Secondary | ICD-10-CM | POA: Diagnosis not present

## 2021-07-07 DIAGNOSIS — M25571 Pain in right ankle and joints of right foot: Secondary | ICD-10-CM | POA: Diagnosis not present

## 2021-07-07 DIAGNOSIS — G8929 Other chronic pain: Secondary | ICD-10-CM | POA: Diagnosis not present

## 2021-07-07 DIAGNOSIS — M7062 Trochanteric bursitis, left hip: Secondary | ICD-10-CM | POA: Diagnosis not present

## 2021-07-07 DIAGNOSIS — R42 Dizziness and giddiness: Secondary | ICD-10-CM | POA: Diagnosis not present

## 2021-07-09 DIAGNOSIS — R42 Dizziness and giddiness: Secondary | ICD-10-CM | POA: Diagnosis not present

## 2021-07-09 DIAGNOSIS — M25571 Pain in right ankle and joints of right foot: Secondary | ICD-10-CM | POA: Diagnosis not present

## 2021-07-09 DIAGNOSIS — G8929 Other chronic pain: Secondary | ICD-10-CM | POA: Diagnosis not present

## 2021-07-09 DIAGNOSIS — M810 Age-related osteoporosis without current pathological fracture: Secondary | ICD-10-CM | POA: Diagnosis not present

## 2021-07-09 DIAGNOSIS — S76312D Strain of muscle, fascia and tendon of the posterior muscle group at thigh level, left thigh, subsequent encounter: Secondary | ICD-10-CM | POA: Diagnosis not present

## 2021-07-09 DIAGNOSIS — M7062 Trochanteric bursitis, left hip: Secondary | ICD-10-CM | POA: Diagnosis not present

## 2021-07-14 DIAGNOSIS — M810 Age-related osteoporosis without current pathological fracture: Secondary | ICD-10-CM | POA: Diagnosis not present

## 2021-07-14 DIAGNOSIS — G8929 Other chronic pain: Secondary | ICD-10-CM | POA: Diagnosis not present

## 2021-07-14 DIAGNOSIS — R42 Dizziness and giddiness: Secondary | ICD-10-CM | POA: Diagnosis not present

## 2021-07-14 DIAGNOSIS — M25571 Pain in right ankle and joints of right foot: Secondary | ICD-10-CM | POA: Diagnosis not present

## 2021-07-14 DIAGNOSIS — M7062 Trochanteric bursitis, left hip: Secondary | ICD-10-CM | POA: Diagnosis not present

## 2021-07-14 DIAGNOSIS — S76312D Strain of muscle, fascia and tendon of the posterior muscle group at thigh level, left thigh, subsequent encounter: Secondary | ICD-10-CM | POA: Diagnosis not present

## 2021-07-16 DIAGNOSIS — M7062 Trochanteric bursitis, left hip: Secondary | ICD-10-CM | POA: Diagnosis not present

## 2021-07-16 DIAGNOSIS — M25571 Pain in right ankle and joints of right foot: Secondary | ICD-10-CM | POA: Diagnosis not present

## 2021-07-16 DIAGNOSIS — M810 Age-related osteoporosis without current pathological fracture: Secondary | ICD-10-CM | POA: Diagnosis not present

## 2021-07-16 DIAGNOSIS — G8929 Other chronic pain: Secondary | ICD-10-CM | POA: Diagnosis not present

## 2021-07-16 DIAGNOSIS — S76312D Strain of muscle, fascia and tendon of the posterior muscle group at thigh level, left thigh, subsequent encounter: Secondary | ICD-10-CM | POA: Diagnosis not present

## 2021-07-16 DIAGNOSIS — R42 Dizziness and giddiness: Secondary | ICD-10-CM | POA: Diagnosis not present

## 2021-07-17 ENCOUNTER — Emergency Department (HOSPITAL_COMMUNITY): Payer: Medicare Other

## 2021-07-17 ENCOUNTER — Other Ambulatory Visit: Payer: Self-pay

## 2021-07-17 ENCOUNTER — Observation Stay (HOSPITAL_COMMUNITY)
Admission: EM | Admit: 2021-07-17 | Discharge: 2021-07-19 | Disposition: A | Discharge status: Home / Self Care | Payer: Medicare Other | Attending: Internal Medicine | Admitting: Internal Medicine

## 2021-07-17 ENCOUNTER — Encounter (HOSPITAL_COMMUNITY): Payer: Self-pay | Admitting: Emergency Medicine

## 2021-07-17 DIAGNOSIS — M25532 Pain in left wrist: Secondary | ICD-10-CM | POA: Diagnosis not present

## 2021-07-17 DIAGNOSIS — E876 Hypokalemia: Secondary | ICD-10-CM | POA: Diagnosis not present

## 2021-07-17 DIAGNOSIS — Z8673 Personal history of transient ischemic attack (TIA), and cerebral infarction without residual deficits: Secondary | ICD-10-CM | POA: Insufficient documentation

## 2021-07-17 DIAGNOSIS — Z79899 Other long term (current) drug therapy: Secondary | ICD-10-CM | POA: Diagnosis not present

## 2021-07-17 DIAGNOSIS — E782 Mixed hyperlipidemia: Secondary | ICD-10-CM | POA: Diagnosis not present

## 2021-07-17 DIAGNOSIS — K219 Gastro-esophageal reflux disease without esophagitis: Secondary | ICD-10-CM | POA: Diagnosis not present

## 2021-07-17 DIAGNOSIS — R609 Edema, unspecified: Secondary | ICD-10-CM | POA: Diagnosis not present

## 2021-07-17 DIAGNOSIS — R6510 Systemic inflammatory response syndrome (SIRS) of non-infectious origin without acute organ dysfunction: Secondary | ICD-10-CM | POA: Diagnosis present

## 2021-07-17 DIAGNOSIS — G459 Transient cerebral ischemic attack, unspecified: Secondary | ICD-10-CM | POA: Diagnosis not present

## 2021-07-17 DIAGNOSIS — M25539 Pain in unspecified wrist: Secondary | ICD-10-CM | POA: Diagnosis not present

## 2021-07-17 DIAGNOSIS — Y900 Blood alcohol level of less than 20 mg/100 ml: Secondary | ICD-10-CM | POA: Diagnosis not present

## 2021-07-17 DIAGNOSIS — Z743 Need for continuous supervision: Secondary | ICD-10-CM | POA: Diagnosis not present

## 2021-07-17 DIAGNOSIS — R9431 Abnormal electrocardiogram [ECG] [EKG]: Secondary | ICD-10-CM | POA: Diagnosis not present

## 2021-07-17 DIAGNOSIS — Z20822 Contact with and (suspected) exposure to covid-19: Secondary | ICD-10-CM | POA: Diagnosis not present

## 2021-07-17 DIAGNOSIS — R519 Headache, unspecified: Secondary | ICD-10-CM | POA: Diagnosis not present

## 2021-07-17 LAB — COMPREHENSIVE METABOLIC PANEL
ALT: 28 U/L (ref 0–44)
AST: 25 U/L (ref 15–41)
Albumin: 3.7 g/dL (ref 3.5–5.0)
Alkaline Phosphatase: 74 U/L (ref 38–126)
Anion gap: 9 (ref 5–15)
BUN: 5 mg/dL — ABNORMAL LOW (ref 8–23)
CO2: 26 mmol/L (ref 22–32)
Calcium: 8.9 mg/dL (ref 8.9–10.3)
Chloride: 104 mmol/L (ref 98–111)
Creatinine, Ser: 0.68 mg/dL (ref 0.44–1.00)
GFR, Estimated: >60 mL/min (ref >60)
Glucose, Bld: 98 mg/dL (ref 70–99)
Potassium: 3.2 mmol/L — ABNORMAL LOW (ref 3.5–5.1)
Sodium: 139 mmol/L (ref 135–145)
Total Bilirubin: 0.6 mg/dL (ref 0.3–1.2)
Total Protein: 6.7 g/dL (ref 6.5–8.1)

## 2021-07-17 LAB — CBC
HCT: 40.9 % (ref 36.0–46.0)
Hemoglobin: 13.2 g/dL (ref 12.0–15.0)
MCH: 31 pg (ref 26.0–34.0)
MCHC: 32.3 g/dL (ref 30.0–36.0)
MCV: 96 fL (ref 80.0–100.0)
Platelets: 267 10*3/uL (ref 150–400)
RBC: 4.26 MIL/uL (ref 3.87–5.11)
RDW: 13.2 % (ref 11.5–15.5)
WBC: 11.1 10*3/uL — ABNORMAL HIGH (ref 4.0–10.5)
nRBC: 0 % (ref 0.0–0.2)

## 2021-07-17 LAB — PROTIME-INR
INR: 1 (ref 0.8–1.2)
Prothrombin Time: 13 seconds (ref 11.4–15.2)

## 2021-07-17 LAB — I-STAT CHEM 8, ED
BUN: 4 mg/dL — ABNORMAL LOW (ref 8–23)
Calcium, Ion: 1.12 mmol/L — ABNORMAL LOW (ref 1.15–1.40)
Chloride: 103 mmol/L (ref 98–111)
Creatinine, Ser: 0.6 mg/dL (ref 0.44–1.00)
Glucose, Bld: 95 mg/dL (ref 70–99)
HCT: 40 % (ref 36.0–46.0)
Hemoglobin: 13.6 g/dL (ref 12.0–15.0)
Potassium: 3.2 mmol/L — ABNORMAL LOW (ref 3.5–5.1)
Sodium: 141 mmol/L (ref 135–145)
TCO2: 25 mmol/L (ref 22–32)

## 2021-07-17 LAB — RESP PANEL BY RT-PCR (FLU A&B, COVID) ARPGX2
Influenza A by PCR: NEGATIVE
Influenza B by PCR: NEGATIVE
SARS Coronavirus 2 by RT PCR: NEGATIVE

## 2021-07-17 LAB — C-REACTIVE PROTEIN: CRP: 1 mg/dL — ABNORMAL HIGH (ref <1.0)

## 2021-07-17 LAB — DIFFERENTIAL
Abs Immature Granulocytes: 0.03 10*3/uL (ref 0.00–0.07)
Basophils Absolute: 0 10*3/uL (ref 0.0–0.1)
Basophils Relative: 0 %
Eosinophils Absolute: 0.1 10*3/uL (ref 0.0–0.5)
Eosinophils Relative: 1 %
Immature Granulocytes: 0 %
Lymphocytes Relative: 20 %
Lymphs Abs: 2.2 10*3/uL (ref 0.7–4.0)
Monocytes Absolute: 0.8 10*3/uL (ref 0.1–1.0)
Monocytes Relative: 7 %
Neutro Abs: 7.9 10*3/uL — ABNORMAL HIGH (ref 1.7–7.7)
Neutrophils Relative %: 72 %

## 2021-07-17 LAB — CK: Total CK: 54 U/L (ref 38–234)

## 2021-07-17 LAB — ETHANOL: Alcohol, Ethyl (B): <10 mg/dL (ref <10)

## 2021-07-17 LAB — APTT: aPTT: 34 seconds (ref 24–36)

## 2021-07-17 LAB — SEDIMENTATION RATE: Sed Rate: 16 mm/hr (ref 0–22)

## 2021-07-17 IMAGING — DX DG WRIST COMPLETE 3+V*L*
4 series · 4 of 4 positions shown · non-contrast
Comparison: None.

CLINICAL DATA: Left wrist pain.  No known injury.

EXAM:
LEFT WRIST - COMPLETE 3+ VIEW

[wrist pa]
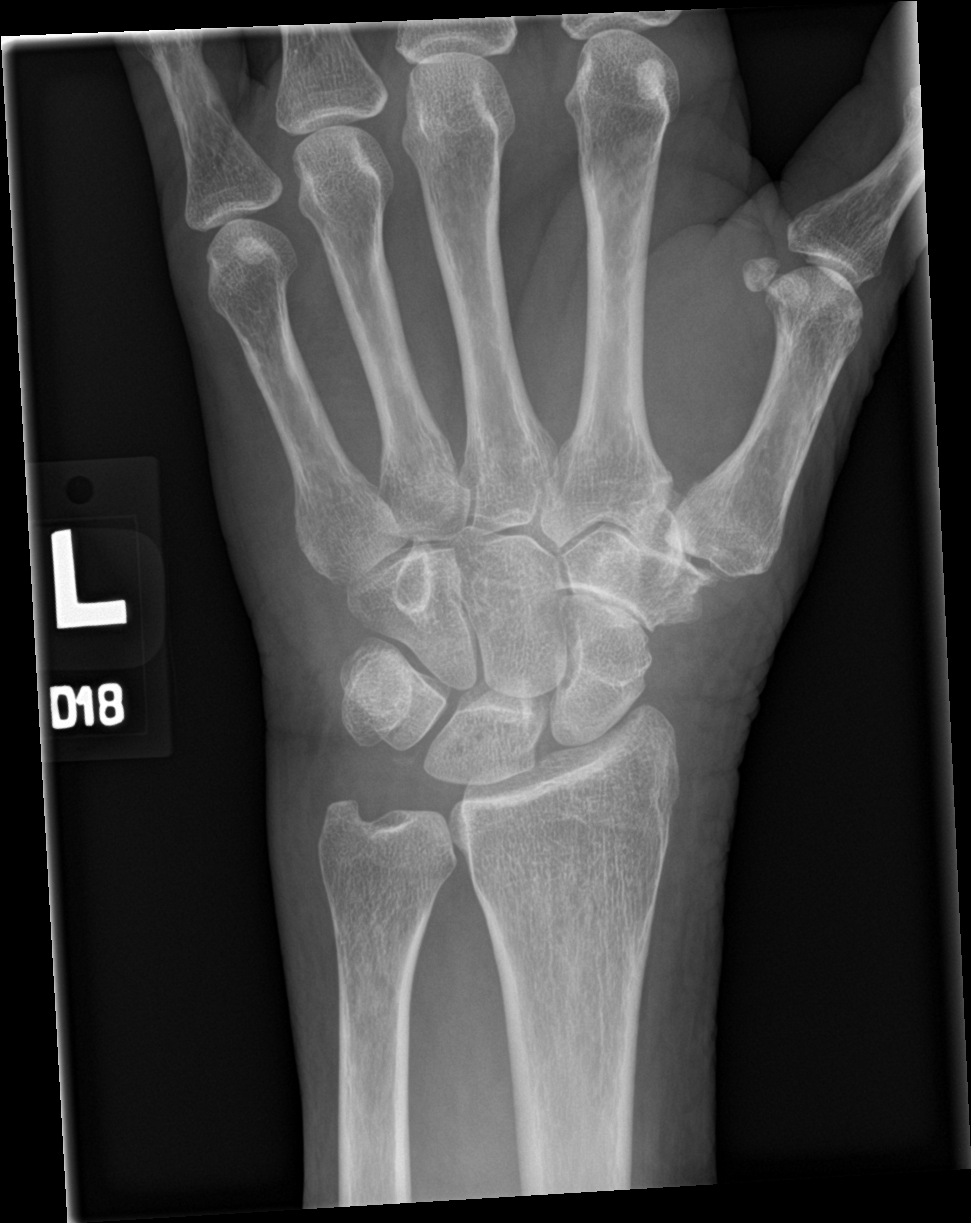

[wrist obl]
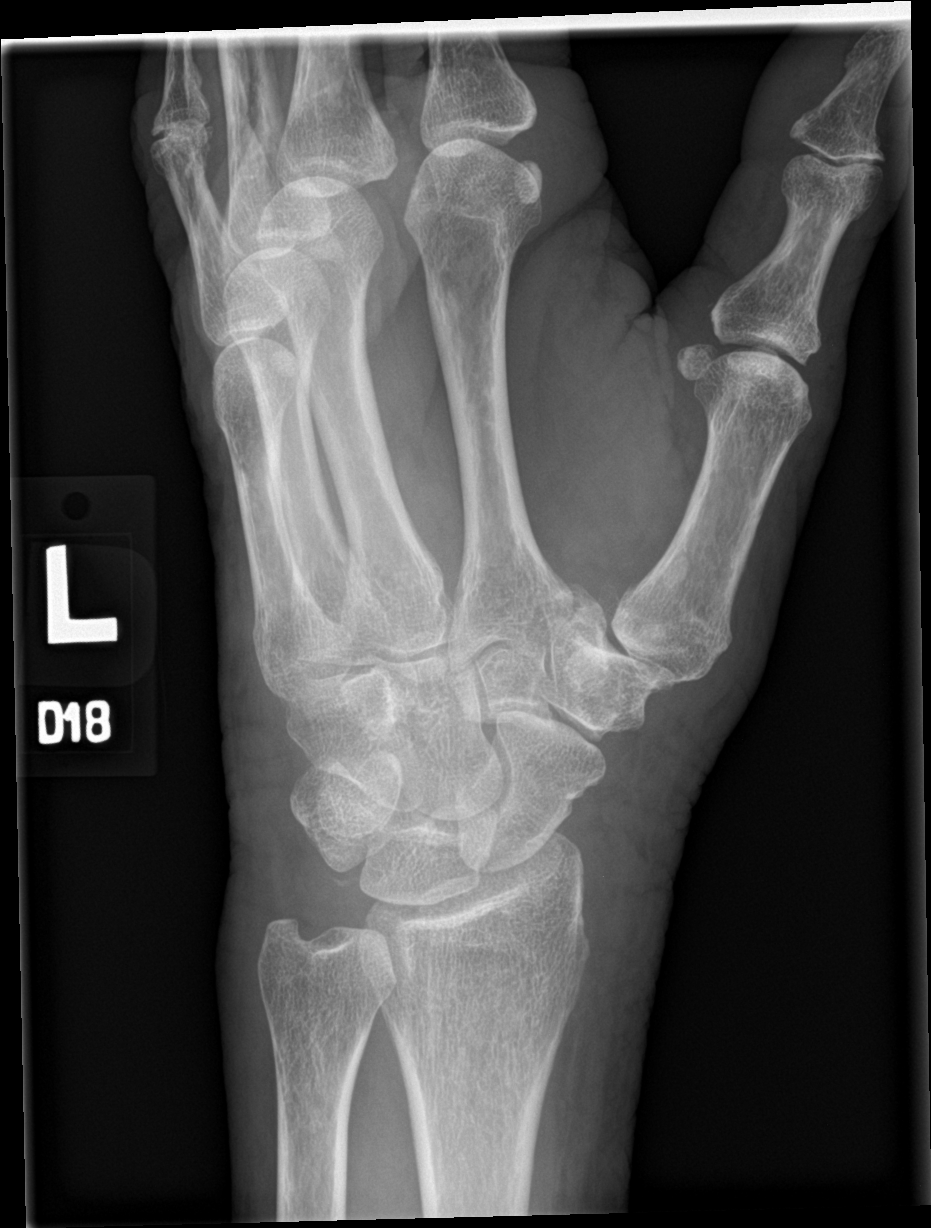

[wrist lat]
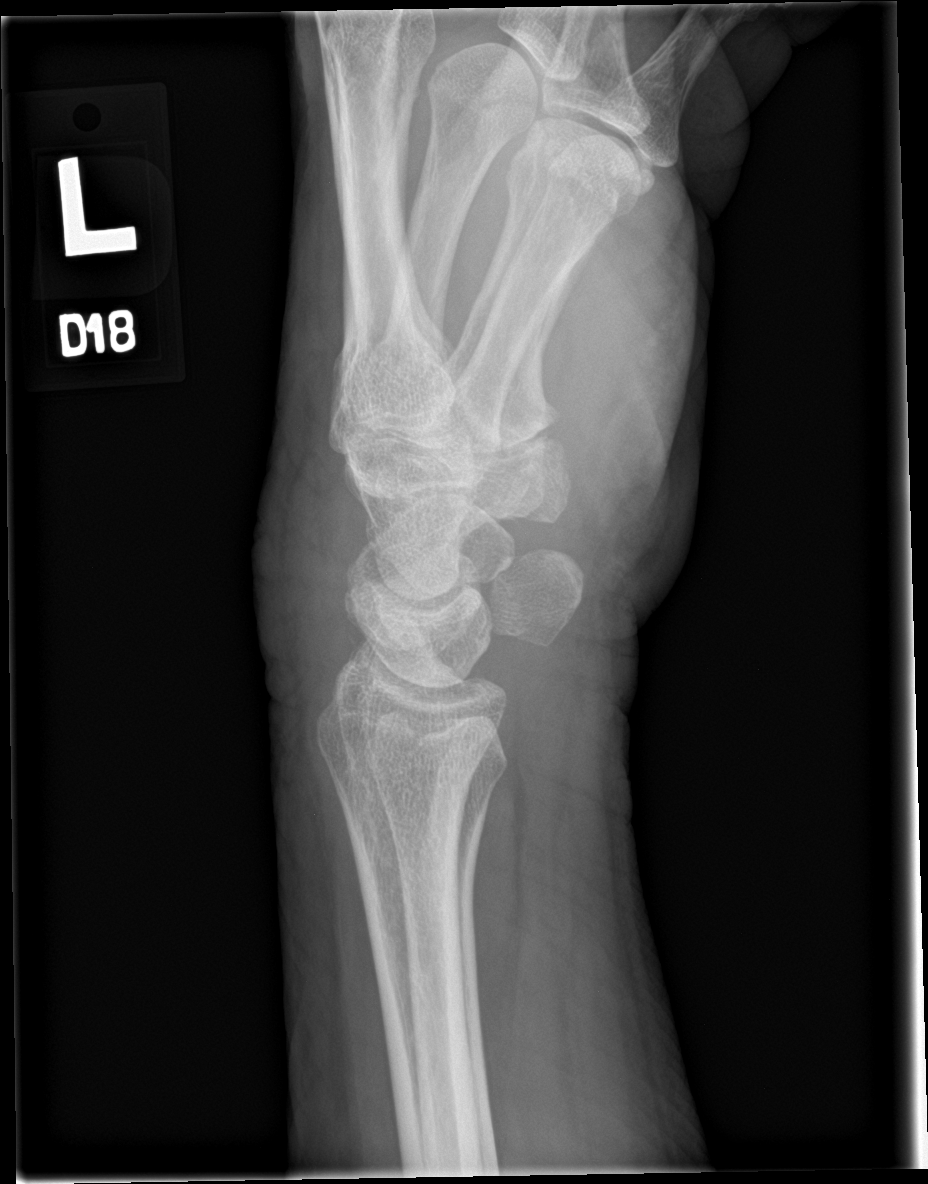

[wrist navicular]
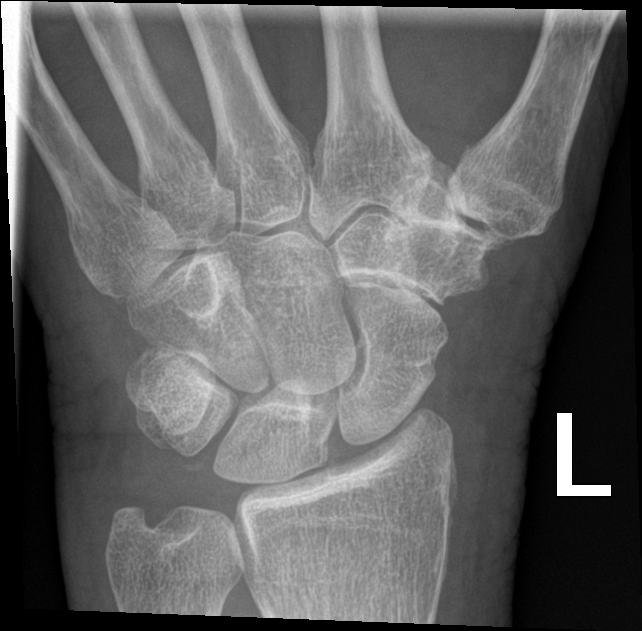

[4 of 4 positions shown; findings below may reference images not displayed]

FINDINGS: Degenerative changes of the 1st carpometacarpal joint. No acute bony
abnormality. Specifically, no fracture, subluxation, or dislocation.
IMPRESSION: No acute bony abnormality.

## 2021-07-17 IMAGING — CT CT HEAD W/O CM
3 series · 16 of 47 positions shown, 19 images · non-contrast
Comparison: [DATE]

CLINICAL DATA: TIA

EXAM:
CT HEAD WITHOUT CONTRAST
TECHNIQUE: Contiguous axial images were obtained from the base of the skull
through the vertex without intravenous contrast.

[Series 4: head 5.0 h30s · axial · 0.40mm/px · z∈[+1342,+1482]mm · 10 of 34 slices shown, 13 images]
[im 3/34  brain]
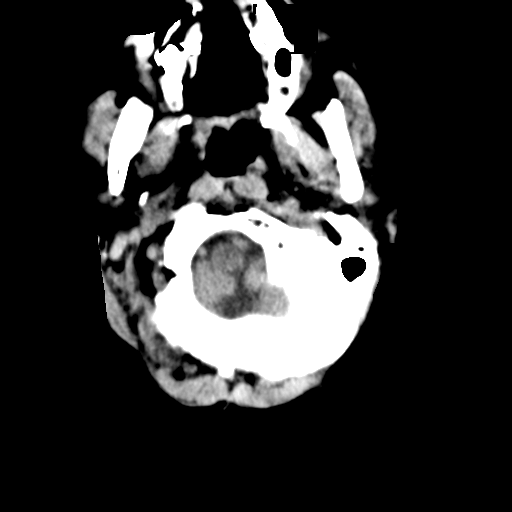
[im 3/34  bone]
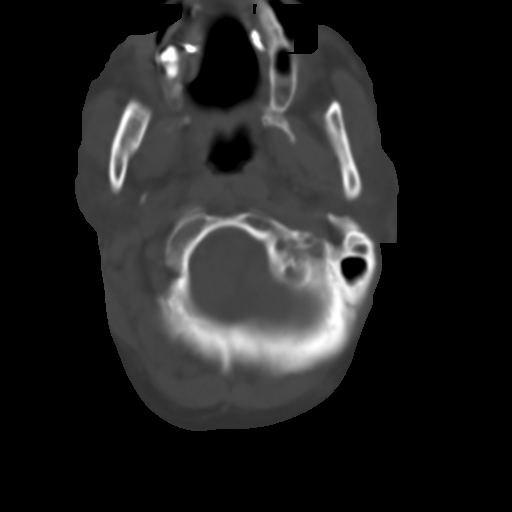
[im 6/34  brain]
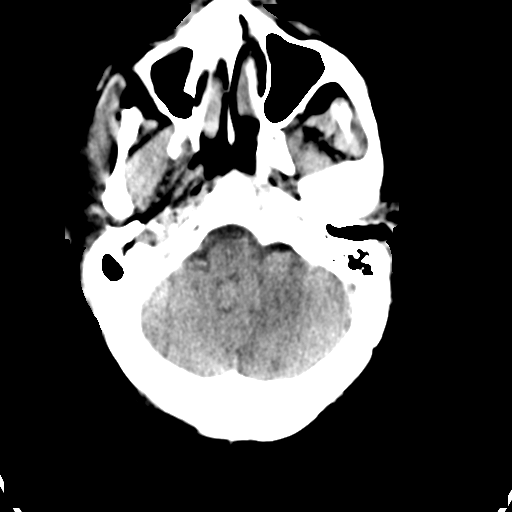
[im 10/34  brain]
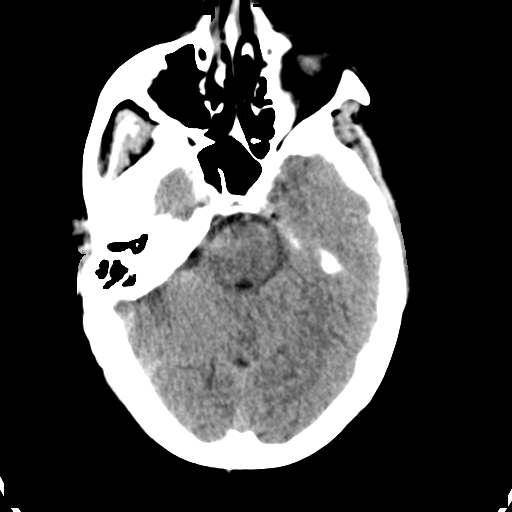
[im 12/34  brain]
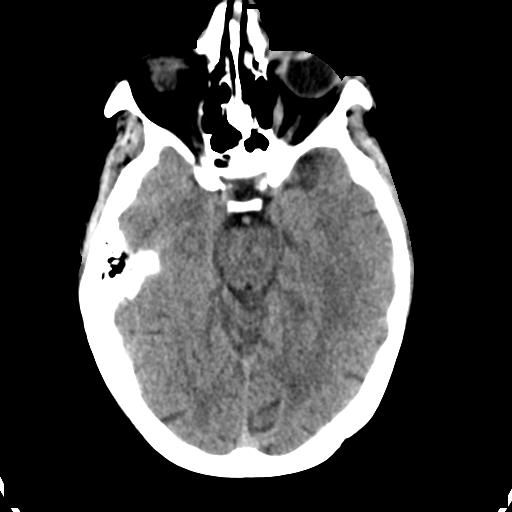
[im 15/34  brain]
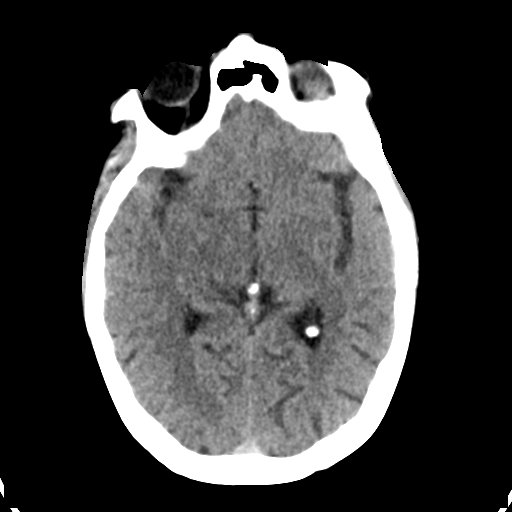
[im 15/34  bone]
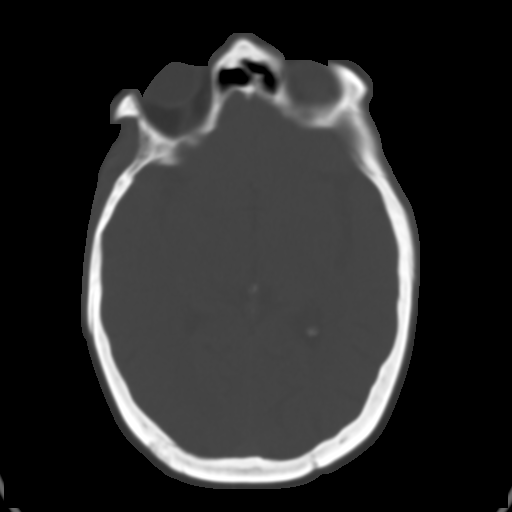
[im 19/34  brain]
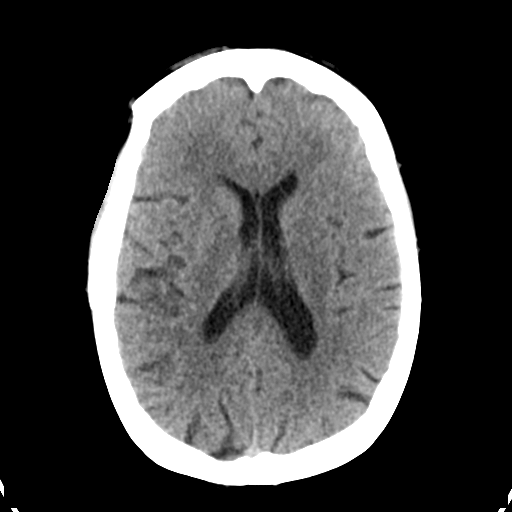
[im 22/34  brain]
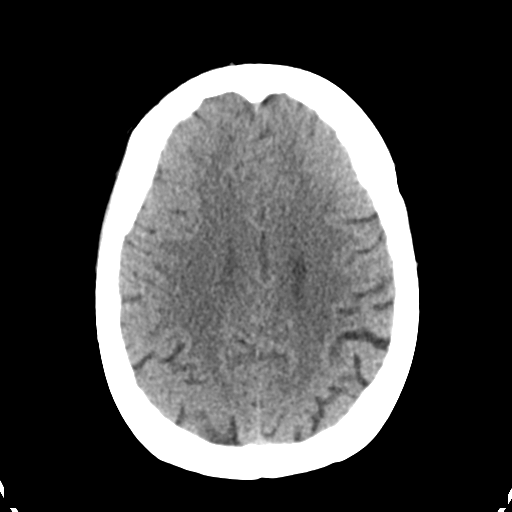
[im 26/34  brain]
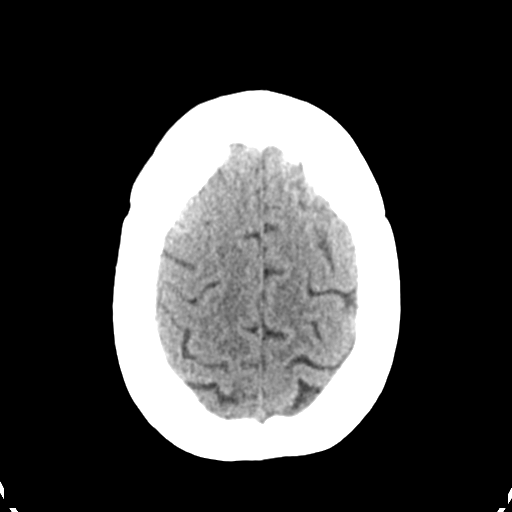
[im 28/34  brain]
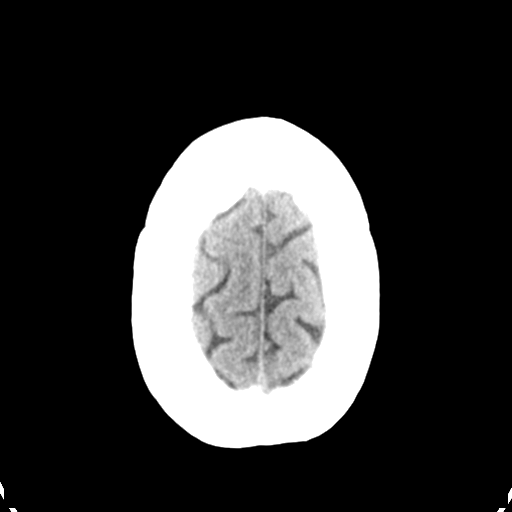
[im 28/34  bone]
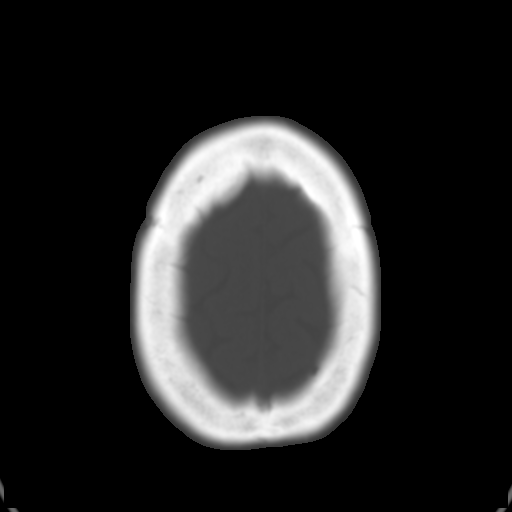
[im 31/34  brain]
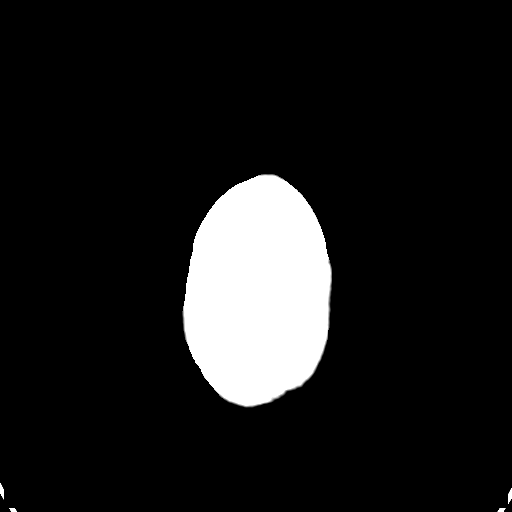

[Series 5: head 3.0 mpr cor · coronal · 0.30mm/px · 3 of 67 slices shown]
[im 23/67  brain]
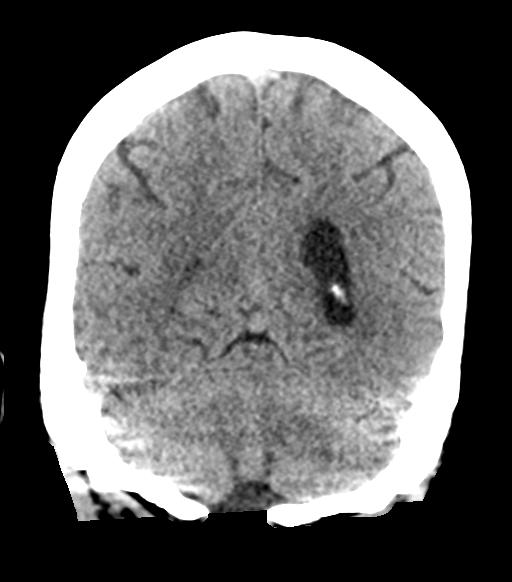
[im 30/67  brain]
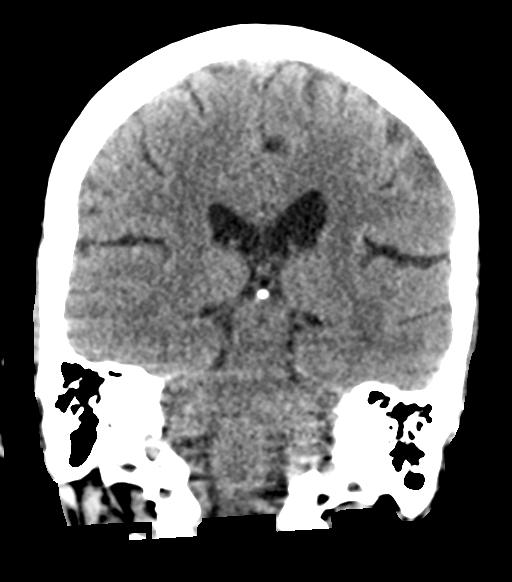
[im 37/67  brain]
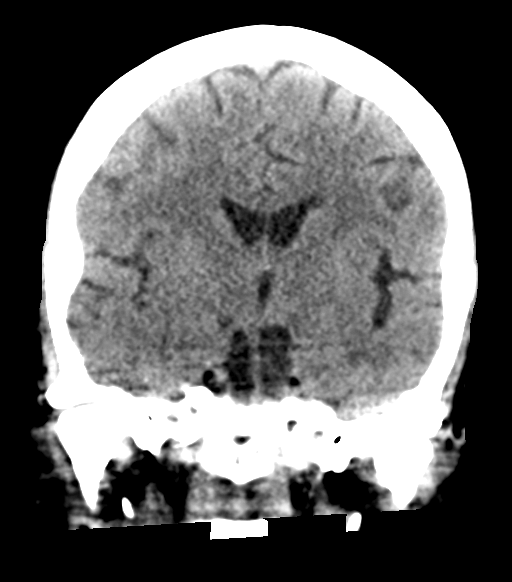

[Series 6: head 3.0 mpr sag · sagittal · 0.34mm/px · 3 of 49 slices shown]
[im 17/49  brain]
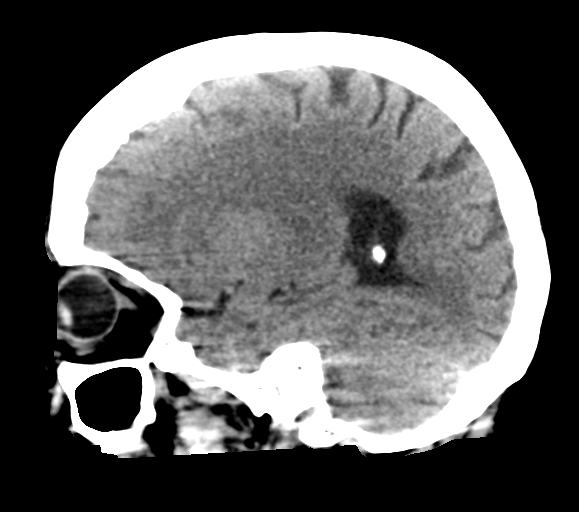
[im 25/49  brain]
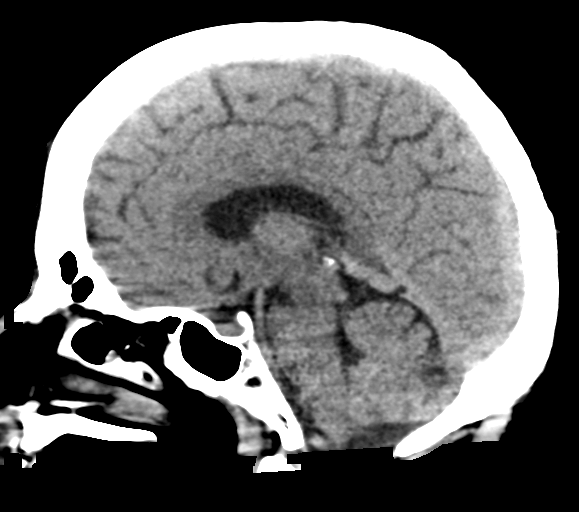
[im 33/49  brain]
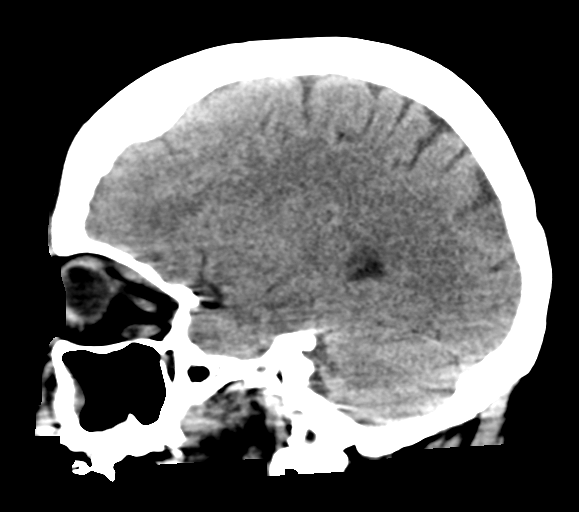

[16 of 47 positions shown; findings below may reference images not displayed]

FINDINGS: Brain: No acute intracranial abnormality. Specifically, no
hemorrhage, hydrocephalus, mass lesion, acute infarction, or
significant intracranial injury.

Vascular: No hyperdense vessel or unexpected calcification.

Skull: No acute calvarial abnormality.

Sinuses/Orbits: Mucosal thickening in the right maxillary sinus. No
air-fluid levels.

Other: None
IMPRESSION: No acute intracranial abnormality.

## 2021-07-17 MED ORDER — MELATONIN 5 MG PO TABS: 10.0000 mg | ORAL_TABLET | Freq: Every evening | ORAL | Status: DC | PRN

## 2021-07-17 MED ORDER — ONDANSETRON HCL 4 MG PO TABS: 4.0000 mg | ORAL_TABLET | Freq: Four times a day (QID) | ORAL | Status: DC | PRN

## 2021-07-17 MED ORDER — VANCOMYCIN HCL 1250 MG/250ML IV SOLN
1250.0000 mg | Freq: Once | INTRAVENOUS | Status: AC
  Administered 2021-07-17: 1250 mg via INTRAVENOUS
  Filled 2021-07-17: qty 250

## 2021-07-17 MED ORDER — LACTATED RINGERS IV SOLN: INTRAVENOUS | Status: AC

## 2021-07-17 MED ORDER — OXYCODONE HCL 5 MG PO TABS: 5.0000 mg | ORAL_TABLET | Freq: Once | ORAL | Status: DC

## 2021-07-17 MED ORDER — ACETAMINOPHEN 650 MG RE SUPP: 650.0000 mg | Freq: Four times a day (QID) | RECTAL | Status: DC | PRN

## 2021-07-17 MED ORDER — VANCOMYCIN HCL IN DEXTROSE 1-5 GM/200ML-% IV SOLN
1000.0000 mg | Freq: Two times a day (BID) | INTRAVENOUS | Status: DC
  Filled 2021-07-17: qty 200

## 2021-07-17 MED ORDER — ROSUVASTATIN CALCIUM 20 MG PO TABS
20.0000 mg | ORAL_TABLET | Freq: Every day | ORAL | Status: DC
  Administered 2021-07-18 – 2021-07-19 (×2): 20 mg via ORAL
  Filled 2021-07-17 (×2): qty 1

## 2021-07-17 MED ORDER — ACETAMINOPHEN 500 MG PO TABS
1000.0000 mg | ORAL_TABLET | Freq: Once | ORAL | Status: AC
  Administered 2021-07-17: 1000 mg via ORAL
  Filled 2021-07-17: qty 2

## 2021-07-17 MED ORDER — POLYETHYLENE GLYCOL 3350 17 G PO PACK: 17.0000 g | PACK | Freq: Every day | ORAL | Status: DC | PRN

## 2021-07-17 MED ORDER — ACETAMINOPHEN 325 MG PO TABS: 650.0000 mg | ORAL_TABLET | Freq: Four times a day (QID) | ORAL | Status: DC | PRN

## 2021-07-17 MED ORDER — ONDANSETRON HCL 4 MG/2ML IJ SOLN: 4.0000 mg | Freq: Four times a day (QID) | INTRAMUSCULAR | Status: DC | PRN

## 2021-07-17 MED ORDER — KETOROLAC TROMETHAMINE 30 MG/ML IJ SOLN
30.0000 mg | Freq: Once | INTRAMUSCULAR | Status: AC
  Administered 2021-07-17: 30 mg via INTRAVENOUS
  Filled 2021-07-17: qty 1

## 2021-07-17 MED ORDER — PANTOPRAZOLE SODIUM 40 MG PO TBEC
40.0000 mg | DELAYED_RELEASE_TABLET | Freq: Every day | ORAL | Status: DC
Start: 2021-07-18 — End: 2021-07-19
  Administered 2021-07-18 – 2021-07-19 (×2): 40 mg via ORAL
  Filled 2021-07-17 (×2): qty 1

## 2021-07-17 NOTE — ED Provider Notes (Signed)
[facility] EMERGENCY DEPARTMENT Provider Note   CSN: [id number] Arrival date & time: 07/17/21  1647     History Chief Complaint  Patient presents with   Wrist Pain    Amber Hicks is a 70 y.o. female.  HPI  70 year old female with past medical history of HLD presents the emergency department with concern for left wrist/hand pain.  Patient states that this started acutely last evening.  Atraumatic, no other injury to the hand.  She is right-hand dominant.  States that overnight the wrist and hand became more painful.  She now has associated tingling in the fingers and hand.  There is never been any hand discoloration.  Known admitted fever from home.  No history of gout.  Past Medical History:  Diagnosis Date   Depression 01/22/2012   Hx of adenomatous colonic polyps 01/26/2015   Hyperlipidemia 01/24/2012   VITAMIN D DEFICIENCY 07/09/2010   Qualifier: Diagnosis of  By: Jenny Reichmann MD, Hunt Oris     Patient Active Problem List   Diagnosis Date Noted   Preop exam for internal medicine 01/15/2021   Osteoporosis 12/10/2020   Aortic atherosclerosis ([place]) 12/10/2020   GERD (gastroesophageal reflux disease) 12/10/2020   Right otitis externa 04/19/2020   Rash 04/19/2020   External hemorrhoids without complication 70/35/0093   Left lateral epicondylitis 12/23/2019   Medial epicondylitis of elbow, left 12/23/2019   Abdominal pain 08/10/2018   Chronic pain of right ankle 06/16/2018   Hx of adenomatous colonic polyps 01/26/2015   Headaches due to old head injury 09/04/2014   Vertigo 08/17/2014   Hidradenitis 04/05/2013   Hyperlipidemia 01/24/2012   Depression 01/22/2012   Encounter for preventative adult health care exam with abnormal findings 01/15/2012   Vitamin D deficiency 07/09/2010    Past Surgical History:  Procedure Laterality Date   ceaserian section     1 time     OB History   No obstetric history on file.     Family History  Problem Relation  Age of Onset   Heart disease Mother    Heart disease Brother    Colon cancer Neg Hx     Social History   Tobacco Use   Smoking status: Never   Smokeless tobacco: Never  Substance Use Topics   Alcohol use: No    Alcohol/week: 0.0 standard drinks   Drug use: No    Home Medications Prior to Admission medications   Medication Sig Start Date End Date Taking? Authorizing Provider  acetaminophen (TYLENOL) 325 MG tablet Take 650 mg by mouth every 6 (six) hours as needed for mild pain.    [provider]  ibuprofen (ADVIL) 600 MG tablet Take 1 tablet (600 mg total) by mouth every 8 (eight) hours as needed for headache. 04/14/19   Lyndal Pulley, DO  lidocaine-prilocaine (EMLA) cream Apply 1 application topically as needed. Apply to the elbow and cover with saran wrap 1 hour prior to the shot 01/23/21   Gregor Hams, MD  LORazepam (ATIVAN) 0.5 MG tablet 1-2 tabs 30 - 60 min prior to shot. Do not drive with this medicine. 01/23/21   Gregor Hams, MD  pantoprazole (PROTONIX) 40 MG tablet Take 1 tablet (40 mg total) by mouth daily. 12/10/20   Biagio Borg, MD  rosuvastatin (CRESTOR) 20 MG tablet Take 1 tablet (20 mg total) by mouth daily. 01/15/21   Biagio Borg, MD    Allergies    Doxycycline  Review of Systems  Review of Systems  Constitutional:  Negative for chills and fever.  HENT:  Negative for congestion.   Respiratory:  Negative for shortness of breath.   Cardiovascular:  Negative for chest pain.  Gastrointestinal:  Negative for abdominal pain, diarrhea and vomiting.  Genitourinary:  Negative for dysuria.  Musculoskeletal:        + left wrist/hand pain  Skin:  Negative for color change, pallor, rash and wound.  Neurological:  Negative for headaches.   Physical Exam Updated Vital Signs BP 105/86   Pulse 94   Temp 100 F (37.8 C) (Oral)   Resp 18   SpO2 98%   Physical Exam Vitals and nursing note reviewed.  Constitutional:      Appearance: Normal appearance.   HENT:     Head: Normocephalic.     Mouth/Throat:     Mouth: Mucous membranes are moist.  Cardiovascular:     Rate and Rhythm: Normal rate.  Pulmonary:     Effort: Pulmonary effort is normal. No respiratory distress.  Abdominal:     Palpations: Abdomen is soft.     Tenderness: There is no abdominal tenderness.  Musculoskeletal:     Comments: Mild edema overlying the dorsal aspect of the left wrist extending slightly into the hand, equal palpable radial pulse, no erythema, mild warmth to the touch, forearm is unremarkable, all 5 fingers seem vascularly intact.  Patient will not fully range the wrist or move the hand secondary to pain.  Skin:    General: Skin is warm.  Neurological:     Mental Status: She is alert and oriented to person, place, and time. Mental status is at baseline.  Psychiatric:        Mood and Affect: Mood normal.    ED Results / Procedures / Treatments   Labs (all labs ordered are listed, but only abnormal results are displayed) Labs Reviewed  CBC - Abnormal; Notable for the following components:      Result Value   WBC 11.1 (*)    All other components within normal limits  DIFFERENTIAL - Abnormal; Notable for the following components:   Neutro Abs 7.9 (*)    All other components within normal limits  COMPREHENSIVE METABOLIC PANEL - Abnormal; Notable for the following components:   Potassium 3.2 (*)    BUN 5 (*)    All other components within normal limits  I-STAT CHEM 8, ED - Abnormal; Notable for the following components:   Potassium 3.2 (*)    BUN 4 (*)    Calcium, Ion 1.12 (*)    All other components within normal limits  RESP PANEL BY RT-PCR (FLU A&B, COVID) ARPGX2  CK  ETHANOL  PROTIME-INR  APTT  RAPID URINE DRUG SCREEN, HOSP PERFORMED  URINALYSIS, ROUTINE W REFLEX MICROSCOPIC    EKG EKG Interpretation  Date/Time:  Thursday July 17 2021 16:57:09 EDT Ventricular Rate:  86 PR Interval:  140 QRS Duration: 82 QT Interval:  358 QTC  Calculation: 428 R Axis:   29 Text Interpretation: Normal sinus rhythm Normal ECG NSR Confirmed by Lavenia Atlas ([id number]) on 07/17/2021 6:50:48 PM  Radiology DG Wrist Complete Left  Result Date: 07/17/2021 CLINICAL DATA:  Left wrist pain.  No known injury. EXAM: LEFT WRIST - COMPLETE 3+ VIEW COMPARISON:  None. FINDINGS: Degenerative changes of the 1st carpometacarpal joint. No acute bony abnormality. Specifically, no fracture, subluxation, or dislocation. IMPRESSION: No acute bony abnormality. Electronically Signed   By: Rolm Baptise M.D.   On: 07/17/2021  18:36   CT HEAD WO CONTRAST  Result Date: 07/17/2021 CLINICAL DATA:  TIA EXAM: CT HEAD WITHOUT CONTRAST TECHNIQUE: Contiguous axial images were obtained from the base of the skull through the vertex without intravenous contrast. COMPARISON:  04/12/2019 FINDINGS: Brain: No acute intracranial abnormality. Specifically, no hemorrhage, hydrocephalus, mass lesion, acute infarction, or significant intracranial injury. Vascular: No hyperdense vessel or unexpected calcification. Skull: No acute calvarial abnormality. Sinuses/Orbits: Mucosal thickening in the right maxillary sinus. No air-fluid levels. Other: None IMPRESSION: No acute intracranial abnormality. Electronically Signed   By: Rolm Baptise M.D.   On: 07/17/2021 18:37    Procedures Procedures   Medications Ordered in ED Medications  acetaminophen (TYLENOL) tablet 1,000 mg (1,000 mg Oral Given 07/17/21 1839)    ED Course  I have reviewed the triage vital signs and the nursing notes.  Pertinent labs & imaging results that were available during my care of the patient were reviewed by me and considered in my medical decision making (see chart for details).    MDM Rules/Calculators/A&P                           70 year old female presents the emergency department with atraumatic left wrist/hand pain.  She also has some associated paresthesias.  Had a low-grade fever of 38.1 on arrival.   Hand itself has some very mild edema of the dorsal aspect of the left wrist, it is very tender to palpation, will not active or passively range the wrist/hand or fingers.  Arm otherwise appears vascularly intact, no discoloration or erythema.  There is slight warmth to the left hand.  No history of gout.  Blood work shows a leukocytosis of 11.1, x-ray does not show any acute findings.  Concern for fever and painful single joint.  Consulted hand surgery, Dr. Claudia Desanctis, who agrees this seems less likely septic arthritis but with degree of discomfort, fever and leukocytosis plan for IV antibiotics, NSAID therapy and Dr. Claudia Desanctis will evaluate the patient tomorrow for further recommendations.  No arthrocentesis recommended at this time.  ESR and blood cultures will be drawn, patients evaluation and results requires admission for further treatment and care. Patient agrees with admission plan, offers no new complaints and is stable/unchanged at time of admit.  Final Clinical Impression(s) / ED Diagnoses Final diagnoses:  None    Rx / DC Orders ED Discharge Orders     None        Lorelle Gibbs, DO 07/17/21 2048

## 2021-07-17 NOTE — ED Provider Notes (Signed)
Emergency Medicine Provider Triage Evaluation Note  Amber Hicks , a 70 y.o. female  was evaluated in triage.  Pt complains of Left arm numbness and pain.  Started acutely yesterday, has been constant since then.  Denies any trauma to the arm.  Reports she is unable to move it, has not tried any alleviating factors..  Review of Systems  Positive: Arm pain, arm numbness Negative: Headache, chest pain  Physical Exam  BP (!) 159/85 (BP Location: Right Arm)   Pulse 86   Temp (!) 100.5 F (38.1 C) (Oral)   Resp 18   SpO2 98%  Gen:   Awake, tearful Resp:  Normal effort  MSK:   Pulls away from painful stimulus, but unable to flex or extend at the digits or the arm. Other:  Cranial nerves III through XII grossly intact.  Medical Decision Making  Medically screening exam initiated at 4:56 PM.  Appropriate orders placed.  Amber Hicks was informed that the remainder of the evaluation will be completed by another provider, this initial triage assessment does not replace that evaluation, and the importance of remaining in the ED until their evaluation is complete.  Cannot rule out TIA given the numbness, patient is at a stroke window however.  We will also add CPK for concern about rhabdo, and neck fasciitis also on the differential as well as compartment syndrome.  Patient will need to be further evaluated in the back, but currently her vitals are stable.   Sherrill Raring, PA-C 07/17/21 [place], [place], DO 07/18/21 [id number]

## 2021-07-17 NOTE — ED Triage Notes (Signed)
Pt BIB [facility] from home, c/o left wrist pain x 1 day. Denies injury/trauma.

## 2021-07-17 NOTE — H&P (Signed)
History and Physical    Amber Hicks HYI:[id number] DOB: 1951-04-06 DOA: 07/17/2021  PCP: Biagio Borg, MD  Patient coming from: Home via EMS   Chief Complaint:  Chief Complaint  Patient presents with   Wrist Pain     HPI:    70 year old female with past medical history of multiple musculoskeletal complaints including chronic right ankle pain and left lateral epicondylitis, hyperlipidemia and gastroesophageal reflux disease who presents to [facility] emergency department with complaints of left wrist pain.  Patient explains that the pain left wrist began yesterday evening.  Pain is severe in intensity, sharp in quality, radiating proximally and associated with tingling of the fingers of the associated hand.  Range of motion of the fingers is severely limited due to severe pain.  Pain is worse with any movement of the hand or movement of the wrist.  Patient denies any recent trauma, insect bite, animal bites to the hand.  Patient denies any history of gout, fever, sick contacts.  Patient eventually presented to [facility] emergency department for evaluation.    Upon evaluation in the emergency department, initial work-up revealed multiple SIRS criteria including a mild leukocytosis of 11.1 and fever of 100.5 F.  CRP was found to be minimally elevated.  X-ray was noted to be unremarkable.  ER provider discussed case with Dr. Claudia Desanctis with Ortho hand who felt that septic arthritis was unlikely but that overnight observation on intravenous antibiotics with formal Ortho hand consultation in the morning was warranted.  Patient was initiated on intravenous vancomycin and the hospitalist group is now been called to assess the patient for admission the hospital.  Review of Systems:   Review of Systems  Musculoskeletal:  Positive for joint pain.  All other systems reviewed and are negative.  Past Medical History:  Diagnosis Date   Depression 01/22/2012   GERD without  esophagitis 12/10/2020   Hx of adenomatous colonic polyps 01/26/2015   Hyperlipidemia 01/24/2012   Mixed hyperlipidemia 01/24/2012   VITAMIN D DEFICIENCY 07/09/2010   Qualifier: Diagnosis of  By: Jenny Reichmann MD, Hunt Oris     Past Surgical History:  Procedure Laterality Date   ceaserian section     1 time     reports that she has never smoked. She has never used smokeless tobacco. She reports that she does not drink alcohol and does not use drugs.  Allergies  Allergen Reactions   Doxycycline Rash    Family History  Problem Relation Age of Onset   Heart disease Mother    Heart disease Brother    Colon cancer Neg Hx      Prior to Admission medications   Medication Sig Start Date End Date Taking? Authorizing Provider  acetaminophen (TYLENOL) 325 MG tablet Take 650 mg by mouth every 6 (six) hours as needed for mild pain.   Yes [provider]  ibuprofen (ADVIL) 600 MG tablet Take 1 tablet (600 mg total) by mouth every 8 (eight) hours as needed for headache. 04/14/19  Yes Lyndal Pulley, DO  LORazepam (ATIVAN) 0.5 MG tablet 1-2 tabs 30 - 60 min prior to shot. Do not drive with this medicine. Patient taking differently: Take 0.5 mg by mouth every 6 (six) hours as needed for anxiety. 01/23/21  Yes Gregor Hams, MD  omeprazole (PRILOSEC) 40 MG capsule Take 40 mg by mouth daily.   Yes [provider]  rosuvastatin (CRESTOR) 20 MG tablet Take 1 tablet (20 mg total) by mouth daily.  01/15/21  Yes Biagio Borg, MD  lidocaine-prilocaine (EMLA) cream Apply 1 application topically as needed. Apply to the elbow and cover with saran wrap 1 hour prior to the shot Patient not taking: Reported on 07/17/2021 01/23/21   Gregor Hams, MD  pantoprazole (PROTONIX) 40 MG tablet Take 1 tablet (40 mg total) by mouth daily. Patient not taking: Reported on 07/17/2021 12/10/20   Biagio Borg, MD    Physical Exam: Vitals:   07/17/21 1838 07/17/21 1915 07/17/21 1945 07/17/21 2130  BP:  105/86  132/65 112/76  Pulse:  94 70 69  Resp:  18 16 16   Temp: 100 F (37.8 C)     TempSrc: Oral     SpO2:  98% 98% 97%    Constitutional: Awake alert and oriented x3, patient is in distress due to pain. Skin: no rashes, no lesions, poor skin turgor noted. Eyes: Pupils are equally reactive to light.  No evidence of scleral icterus or conjunctival pallor.  ENMT: Dry mucous membranes noted.  Posterior pharynx clear of any exudate or lesions.   Neck: normal, supple, no masses, no thyromegaly.  No evidence of jugular venous distension.   Respiratory: clear to auscultation bilaterally, no wheezing, no crackles. Normal respiratory effort. No accessory muscle use.  Cardiovascular: Regular rate and rhythm, no murmurs / rubs / gallops. No extremity edema. 2+ pedal pulses. No carotid bruits.  Chest:   Nontender without crepitus or deformity.   Back:   Nontender without crepitus or deformity. Abdomen: Abdomen is soft and nontender.  No evidence of intra-abdominal masses.  Positive bowel sounds noted in all quadrants.   Musculoskeletal: Mild edema of the left hand and wrist with associated warmth and induration as well as limited range of motion of the left wrist due to severe pain.   no contractures. Normal muscle tone.  Neurologic: CN 2-12 grossly intact. Sensation intact.  Patient moving all 4 extremities spontaneously.  Patient is following all commands.  Patient is responsive to verbal stimuli.   Psychiatric: Patient exhibits normal mood with appropriate affect.  Patient seems to possess insight as to their current situation.     Labs on Admission: I have personally reviewed following labs and imaging studies -   CBC: Recent Labs  Lab 07/17/21 1656 07/17/21 1716  WBC 11.1*  --   NEUTROABS 7.9*  --   HGB 13.2 13.6  HCT 40.9 40.0  MCV 96.0  --   PLT 267  --    Basic Metabolic Panel: Recent Labs  Lab 07/17/21 1656 07/17/21 1716  NA 139 141  K 3.2* 3.2*  CL 104 103  CO2 26  --   GLUCOSE  98 95  BUN 5* 4*  CREATININE 0.68 0.60  CALCIUM 8.9  --    GFR: CrCl cannot be calculated (Unknown ideal weight.). Liver Function Tests: Recent Labs  Lab 07/17/21 1656  AST 25  ALT 28  ALKPHOS 74  BILITOT 0.6  PROT 6.7  ALBUMIN 3.7   No results for input(s): LIPASE, AMYLASE in the last 168 hours. No results for input(s): AMMONIA in the last 168 hours. Coagulation Profile: Recent Labs  Lab 07/17/21 1656  INR 1.0   Cardiac Enzymes: Recent Labs  Lab 07/17/21 1656  CKTOTAL 54   BNP (last 3 results) No results for input(s): PROBNP in the last 8760 hours. HbA1C: No results for input(s): HGBA1C in the last 72 hours. CBG: No results for input(s): GLUCAP in the last 168 hours. Lipid Profile:  No results for input(s): CHOL, HDL, LDLCALC, TRIG, CHOLHDL, LDLDIRECT in the last 72 hours. Thyroid Function Tests: No results for input(s): TSH, T4TOTAL, FREET4, T3FREE, THYROIDAB in the last 72 hours. Anemia Panel: No results for input(s): VITAMINB12, FOLATE, FERRITIN, TIBC, IRON, RETICCTPCT in the last 72 hours. Urine analysis:    Component Value Date/Time   COLORURINE YELLOW 01/23/2021 1207   APPEARANCEUR [place] (A) 01/23/2021 1207   LABSPEC 1.025 01/23/2021 1207   PHURINE 5.5 01/23/2021 1207   GLUCOSEU NEGATIVE 01/23/2021 1207   HGBUR SMALL (A) 01/23/2021 1207   HGBUR moderate 07/09/2010 1546   BILIRUBINUR NEGATIVE 01/23/2021 [place] 01/23/2021 1207   PROTEINUR NEGATIVE 12/06/2020 1838   UROBILINOGEN 2.0 (A) 01/23/2021 1207   NITRITE NEGATIVE 01/23/2021 1207   LEUKOCYTESUR SMALL (A) 01/23/2021 1207    Radiological Exams on Admission - Personally Reviewed: DG Wrist Complete Left  Result Date: 07/17/2021 CLINICAL DATA:  Left wrist pain.  No known injury. EXAM: LEFT WRIST - COMPLETE 3+ VIEW COMPARISON:  None. FINDINGS: Degenerative changes of the 1st carpometacarpal joint. No acute bony abnormality. Specifically, no fracture, subluxation, or  dislocation. IMPRESSION: No acute bony abnormality. Electronically Signed   By: Rolm Baptise M.D.   On: 07/17/2021 18:36   CT HEAD WO CONTRAST  Result Date: 07/17/2021 CLINICAL DATA:  TIA EXAM: CT HEAD WITHOUT CONTRAST TECHNIQUE: Contiguous axial images were obtained from the base of the skull through the vertex without intravenous contrast. COMPARISON:  04/12/2019 FINDINGS: Brain: No acute intracranial abnormality. Specifically, no hemorrhage, hydrocephalus, mass lesion, acute infarction, or significant intracranial injury. Vascular: No hyperdense vessel or unexpected calcification. Skull: No acute calvarial abnormality. Sinuses/Orbits: Mucosal thickening in the right maxillary sinus. No air-fluid levels. Other: None IMPRESSION: No acute intracranial abnormality. Electronically Signed   By: Rolm Baptise M.D.   On: 07/17/2021 18:37    EKG: Personally reviewed.  Rhythm is normal sinus rhythm with heart rate of 86 bpm.  No dynamic ST segment changes appreciated.  Assessment/Plan  * Acute pain of left wrist Sudden severe pain and swelling of the left wrist with some extension into the hand and forearm that has been ongoing for approximately 24 hours Exam reveals mild associated swelling with warmth and severely reduced range of motion of the wrist and fingers. Patient additionally exhibiting multiple SIRS criteria with leukocytosis and fever.  Patient additionally has slightly elevated CRP ER provider discussed case with Dr. Claudia Desanctis with orthopedic hand who feels that septic arthritis is unlikely but will evaluate patient the morning of 10/21 and determine if arthrocentesis is necessary.  In the meantime they recommend intravenous antibiotics per the ER provider. ER provider has initiated intravenous vancomycin which will be continued. No history of gout, obtaining uric acid level. Placing on naproxen 500 mg p.o. twice daily. Obtaining MRI of the wrist to evaluate for any evidence of phlegmon, abscess  formation or tenosynovitis While unlikely, will make patient n.p.o. in case of surgical intervention As needed opiate-based analgesics for associated substantial pain.  SIRS (systemic inflammatory response syndrome) (HCC) SIRS present with fever and leukocytosis Actively evaluating for infectious process as noted above No clinical evidence of sepsis/organ dysfunction at this time.  Hypokalemia Replacing with potassium chloride Evaluating for concurrent hypomagnesemia  Monitoring potassium levels with serial chemistries.   Mixed hyperlipidemia Continuing home regimen of lipid lowering therapy.   GERD without esophagitis Continuing home regimen of daily PPI therapy.       Code Status:  Full code  code  status decision has been confirmed with: patient Family Communication: deferred   Status is: Observation  The patient remains OBS appropriate and will d/c before 2 midnights.       Vernelle Emerald MD [facility] Pager [phone]  If 7PM-7AM, please contact night-coverage [email or web address] Use universal [place] password for that web site. If you do not have the password, please call the hospital operator.  07/18/2021, 12:49 AM

## 2021-07-17 NOTE — Progress Notes (Signed)
Pharmacy Antibiotic Note  Amber Hicks is a 70 y.o. female admitted on 07/17/2021 with possible sepsis.  Pharmacy has been consulted for vancomycin dosing. Last weight was 72.7 kg on 07/01/21. She is afebrile with WBC 11.1 and SCr 0.60.   Plan: Vancomycin 1250 mg IV once as a loading dose Followed by vancomycin 1000 mg IV q12h      eAUC 535.0 using SCr 0.6 Monitor vancomycin levels, renal function, cultures, and clinical improvement  Temp (24hrs), Avg:100.3 F (37.9 C), Min:100 F (37.8 C), Max:100.5 F (38.1 C)  Recent Labs  Lab 07/17/21 1656 07/17/21 1716  WBC 11.1*  --   CREATININE 0.68 0.60    CrCl cannot be calculated (Unknown ideal weight.).    Allergies  Allergen Reactions   Doxycycline Rash    Antimicrobials this admission: Vancomycin 10/20>>  Microbiology results: 10/20 BCx: in process  Thank you for allowing pharmacy to be a part of this patient's care.  Donald Pore 07/17/2021 8:43 PM

## 2021-07-18 ENCOUNTER — Observation Stay (HOSPITAL_COMMUNITY): Payer: Medicare Other

## 2021-07-18 DIAGNOSIS — M7989 Other specified soft tissue disorders: Secondary | ICD-10-CM | POA: Diagnosis not present

## 2021-07-18 DIAGNOSIS — E876 Hypokalemia: Secondary | ICD-10-CM | POA: Diagnosis not present

## 2021-07-18 DIAGNOSIS — S63002A Unspecified subluxation of left wrist and hand, initial encounter: Secondary | ICD-10-CM | POA: Diagnosis not present

## 2021-07-18 DIAGNOSIS — M25532 Pain in left wrist: Secondary | ICD-10-CM | POA: Diagnosis not present

## 2021-07-18 DIAGNOSIS — M19032 Primary osteoarthritis, left wrist: Secondary | ICD-10-CM | POA: Diagnosis not present

## 2021-07-18 DIAGNOSIS — M67432 Ganglion, left wrist: Secondary | ICD-10-CM | POA: Diagnosis not present

## 2021-07-18 DIAGNOSIS — E782 Mixed hyperlipidemia: Secondary | ICD-10-CM | POA: Diagnosis not present

## 2021-07-18 DIAGNOSIS — K219 Gastro-esophageal reflux disease without esophagitis: Secondary | ICD-10-CM | POA: Diagnosis not present

## 2021-07-18 LAB — RAPID URINE DRUG SCREEN, HOSP PERFORMED
Amphetamines: NOT DETECTED
Barbiturates: NOT DETECTED
Benzodiazepines: NOT DETECTED
Cocaine: NOT DETECTED
Opiates: NOT DETECTED
Tetrahydrocannabinol: NOT DETECTED

## 2021-07-18 LAB — URINALYSIS, ROUTINE W REFLEX MICROSCOPIC
Bacteria, UA: NONE SEEN
Bilirubin Urine: NEGATIVE
Glucose, UA: NEGATIVE mg/dL
Ketones, ur: NEGATIVE mg/dL
Nitrite: NEGATIVE
Protein, ur: NEGATIVE mg/dL
Specific Gravity, Urine: 1.011 (ref 1.005–1.030)
pH: 5 (ref 5.0–8.0)

## 2021-07-18 LAB — COMPREHENSIVE METABOLIC PANEL
ALT: 22 U/L (ref 0–44)
AST: 21 U/L (ref 15–41)
Albumin: 3.1 g/dL — ABNORMAL LOW (ref 3.5–5.0)
Alkaline Phosphatase: 61 U/L (ref 38–126)
Anion gap: 9 (ref 5–15)
BUN: 6 mg/dL — ABNORMAL LOW (ref 8–23)
CO2: 22 mmol/L (ref 22–32)
Calcium: 8.6 mg/dL — ABNORMAL LOW (ref 8.9–10.3)
Chloride: 109 mmol/L (ref 98–111)
Creatinine, Ser: 0.69 mg/dL (ref 0.44–1.00)
GFR, Estimated: >60 mL/min (ref >60)
Glucose, Bld: 104 mg/dL — ABNORMAL HIGH (ref 70–99)
Potassium: 3.1 mmol/L — ABNORMAL LOW (ref 3.5–5.1)
Sodium: 140 mmol/L (ref 135–145)
Total Bilirubin: 0.8 mg/dL (ref 0.3–1.2)
Total Protein: 5.7 g/dL — ABNORMAL LOW (ref 6.5–8.1)

## 2021-07-18 LAB — CBC WITH DIFFERENTIAL/PLATELET
Abs Immature Granulocytes: 0.02 10*3/uL (ref 0.00–0.07)
Basophils Absolute: 0.1 10*3/uL (ref 0.0–0.1)
Basophils Relative: 1 %
Eosinophils Absolute: 0.1 10*3/uL (ref 0.0–0.5)
Eosinophils Relative: 2 %
HCT: 38.2 % (ref 36.0–46.0)
Hemoglobin: 12.3 g/dL (ref 12.0–15.0)
Immature Granulocytes: 0 %
Lymphocytes Relative: 29 %
Lymphs Abs: 2.4 10*3/uL (ref 0.7–4.0)
MCH: 31.1 pg (ref 26.0–34.0)
MCHC: 32.2 g/dL (ref 30.0–36.0)
MCV: 96.5 fL (ref 80.0–100.0)
Monocytes Absolute: 0.9 10*3/uL (ref 0.1–1.0)
Monocytes Relative: 10 %
Neutro Abs: 4.9 10*3/uL (ref 1.7–7.7)
Neutrophils Relative %: 58 %
Platelets: 223 10*3/uL (ref 150–400)
RBC: 3.96 MIL/uL (ref 3.87–5.11)
RDW: 13.2 % (ref 11.5–15.5)
WBC: 8.4 10*3/uL (ref 4.0–10.5)
nRBC: 0 % (ref 0.0–0.2)

## 2021-07-18 LAB — MAGNESIUM: Magnesium: 1.9 mg/dL (ref 1.7–2.4)

## 2021-07-18 LAB — C-REACTIVE PROTEIN: CRP: 6.9 mg/dL — ABNORMAL HIGH (ref <1.0)

## 2021-07-18 LAB — HIV ANTIBODY (ROUTINE TESTING W REFLEX): HIV Screen 4th Generation wRfx: NONREACTIVE

## 2021-07-18 LAB — URIC ACID: Uric Acid, Serum: 5.4 mg/dL (ref 2.5–7.1)

## 2021-07-18 IMAGING — MR MR WRIST*L* WO/W CM
5 of 10 series · 19 of 40 positions shown · IV contrast (G G)
Comparison: Left wrist x-rays from yesterday.

CLINICAL DATA: Acute severe left wrist pain.  No injury.

EXAM:
MR OF THE LEFT WRIST WITHOUT AND WITH CONTRAST
TECHNIQUE: Multiplanar multisequence MR imaging of the left wrist was performed
both before and after the administration of intravenous contrast.
CONTRAST:  7mL GADAVIST GADOBUTROL 1 MMOL/ML IV SOLN

[Series 4: T1 · axial · 4.0mm · 0.20mm/px · z∈[+31,+72]mm · 3 of 20 slices shown]
[im 1/20]
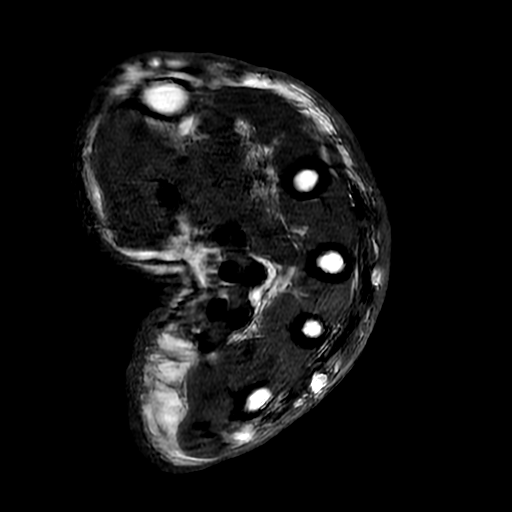
[im 5/20]
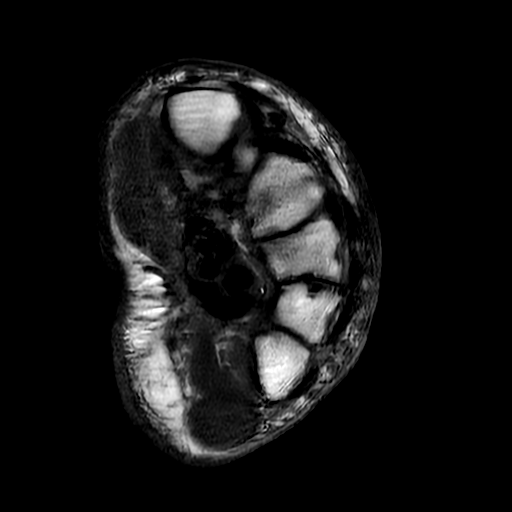
[im 10/20]
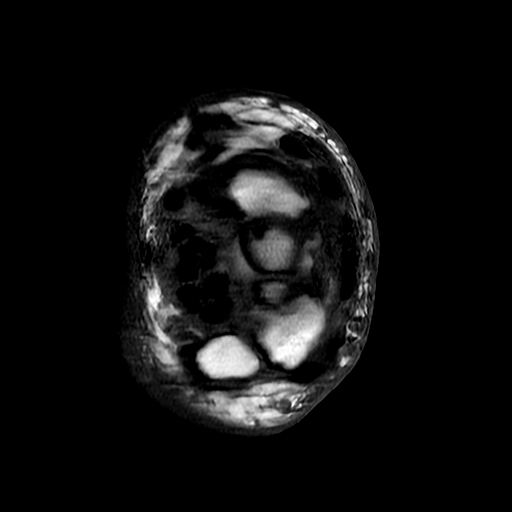

[Series 5: T2 fat-sat · axial · 4.0mm · 0.20mm/px · z∈[+31,+117]mm · 5 of 20 slices shown (1 of 2)]
[im 1/20]
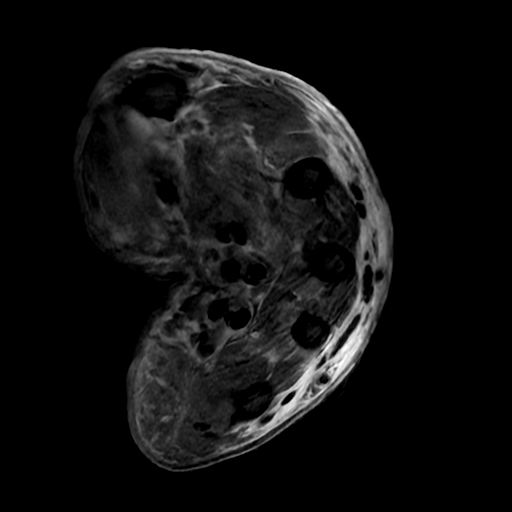
[im 5/20]
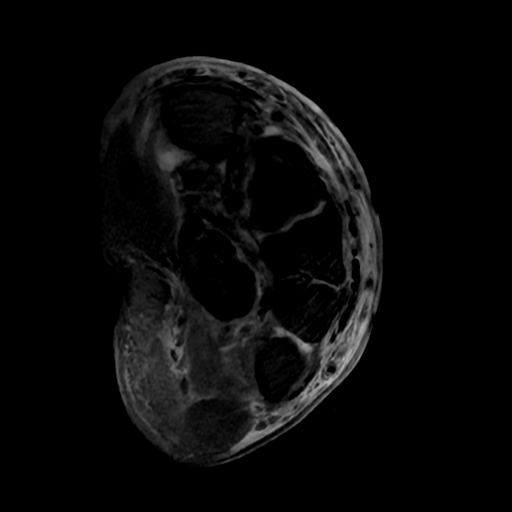
[im 10/20]
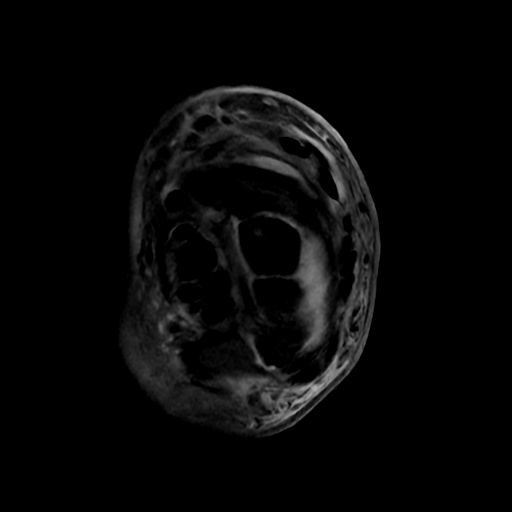
[im 15/20]
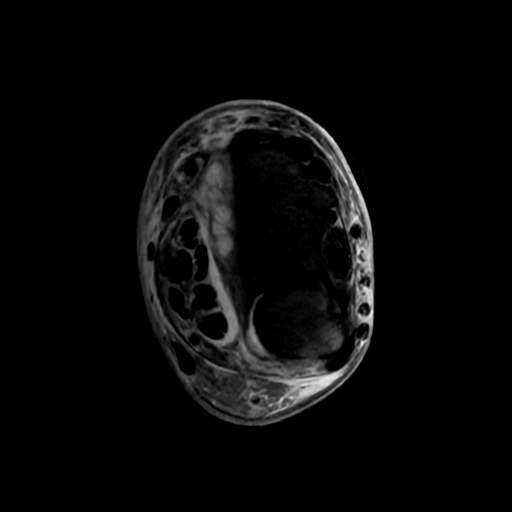
[im 20/20]
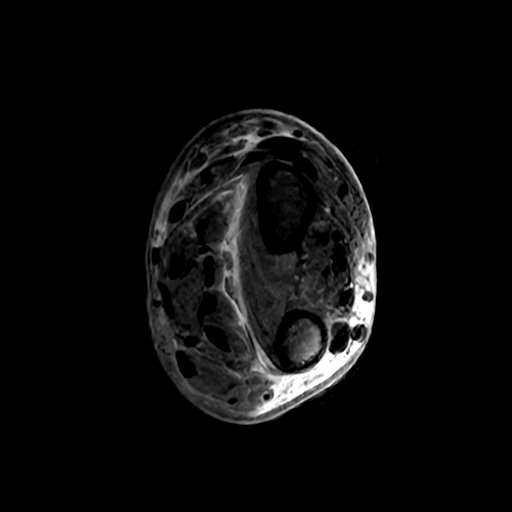

[Series 7: PD fat-sat · sagittal · 3.0mm · 0.20mm/px · 3 of 15 slices shown (1 of 2)]
[im 1/15]
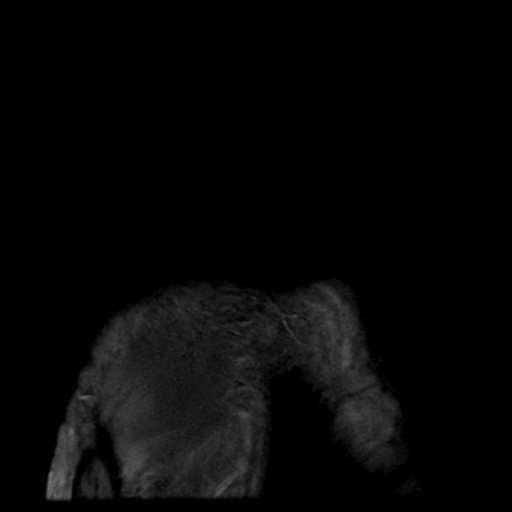
[im 8/15]
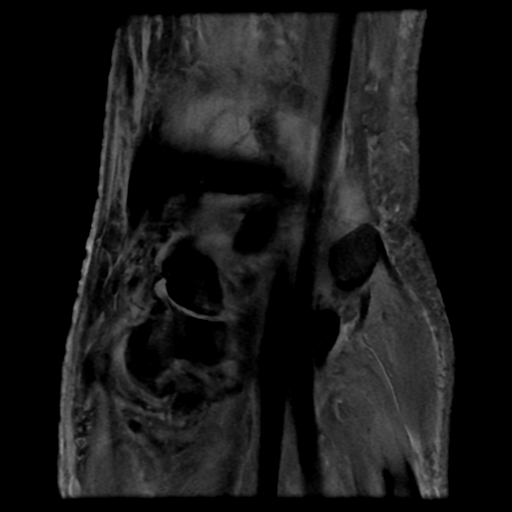
[im 15/15]
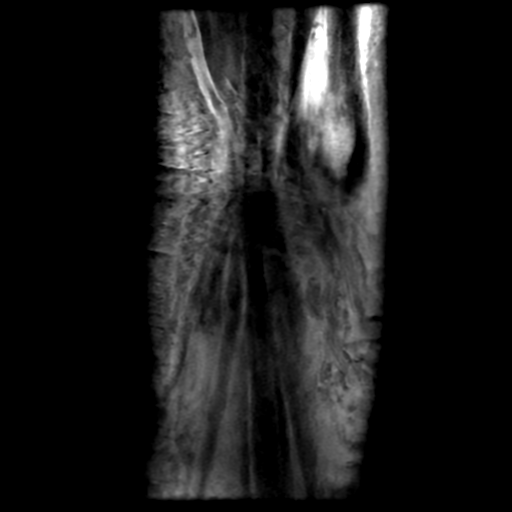

[Series 8: T2 fat-sat · sagittal · 3.0mm · 0.20mm/px · 3 of 15 slices shown (2 of 2)]
[im 1/15]
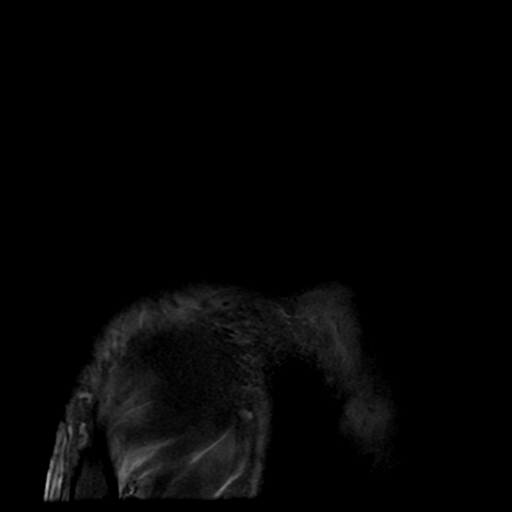
[im 8/15]
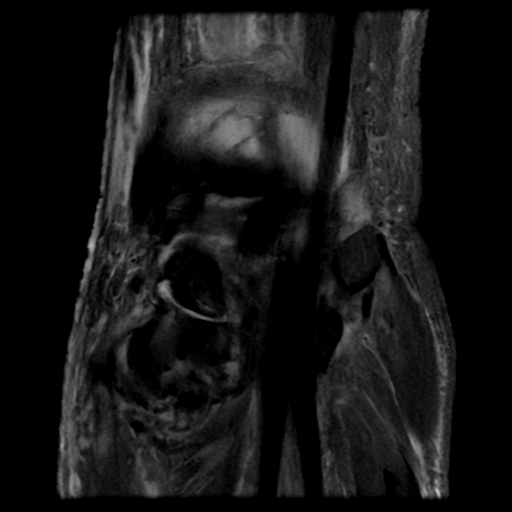
[im 15/15]
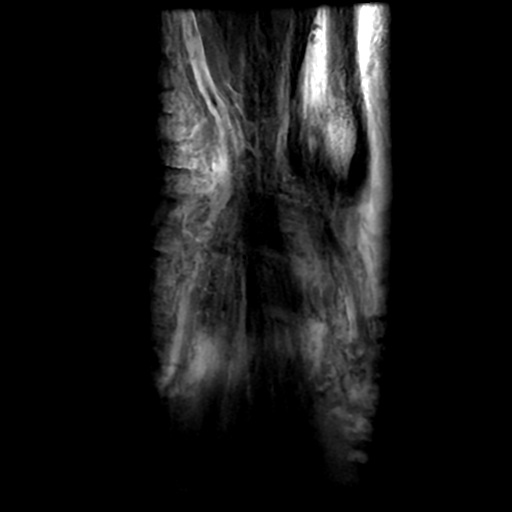

[Series 9: PD fat-sat · coronal · 3.0mm · 0.20mm/px · 5 of 22 slices shown (2 of 2)]
[im 1/22]
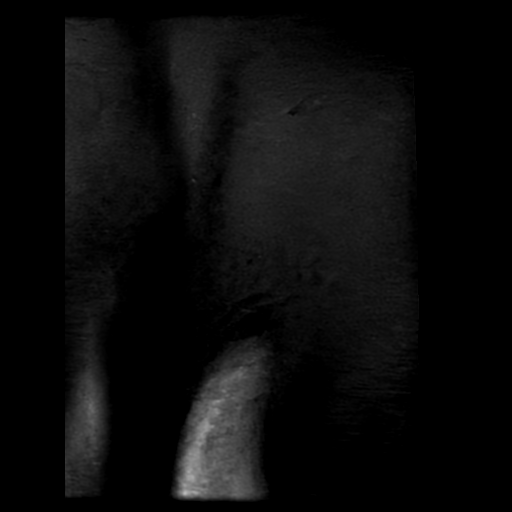
[im 6/22]
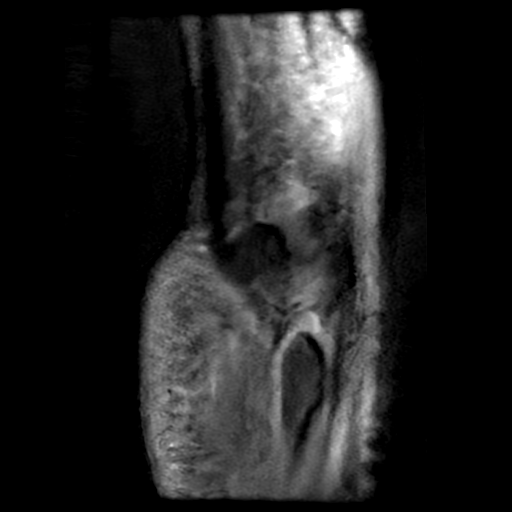
[im 11/22]
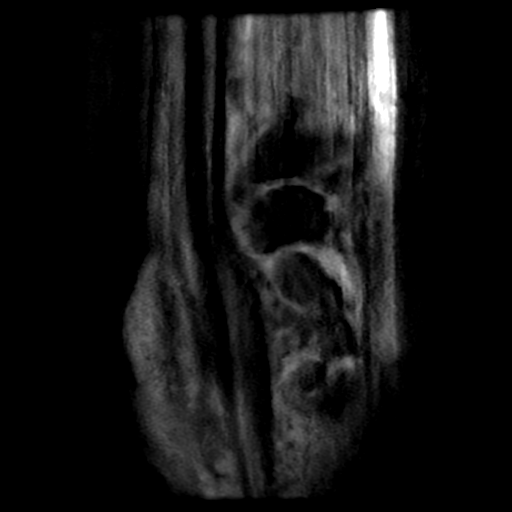
[im 16/22]
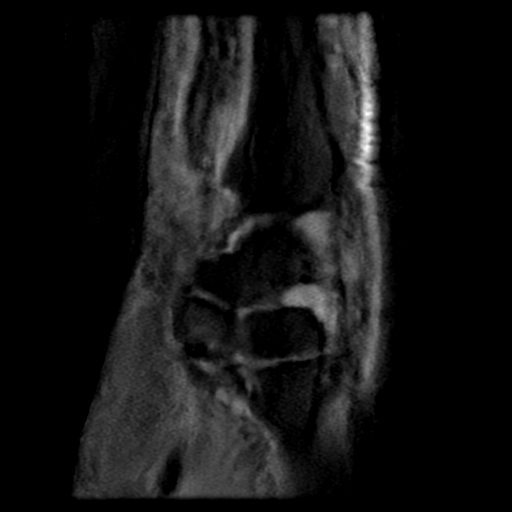
[im 22/22]
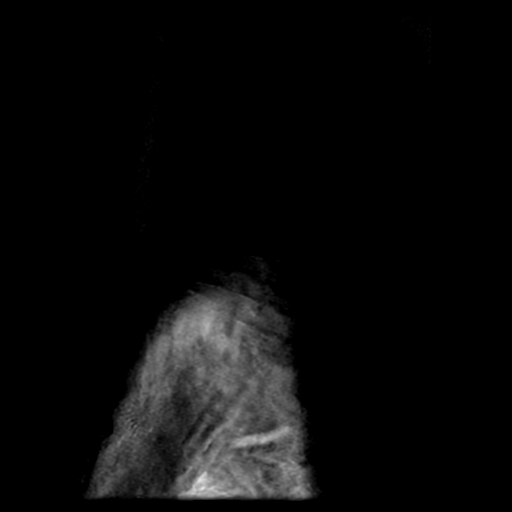

[19 of 40 positions shown; findings below may reference images not displayed]

FINDINGS: Ligaments: Intact scapholunate and lunotriquetral ligaments.

Triangular fibrocartilage: Mild degeneration and ulnar surface
fraying of the articular disc. No discrete tear.

Tendons: Intact flexor and extensor compartment tendons. Ulnar
subluxation of the extensor carpi ulnaris tendon. No tenosynovitis.

Carpal tunnel/median nerve: Normal carpal tunnel. Normal median
nerve.

Guyon's canal: Normal.

Joint/cartilage: Small radiocarpal, midcarpal, first CMC, and fourth
and fifth CMC joint effusions. Mild first CMC and
scaphotrapeziotrapezoid joint osteoarthritis.

Bones/carpal alignment: No acute fracture or dislocation. No bony
erosions. No suspicious bone lesion.

Other: 0.7 x 1.8 x 1.8 cm multiloculated ganglion cyst volar to the
distal radius. Diffuse but dorsal predominant soft tissue swelling.
IMPRESSION: 1. Radiocarpal, midcarpal, and first, fourth, and fifth CMC joint
effusions. Given multifocal involvement, inflammatory arthropathy is
favored. Septic arthritis is less likely.
2. No osteomyelitis, abscess, or tenosynovitis.
3. 1.8 cm multiloculated ganglion cyst volar to the distal radius.
4. Ulnar subluxation of the extensor carpi ulnaris tendon.

## 2021-07-18 MED ORDER — NAPROXEN 250 MG PO TABS
500.0000 mg | ORAL_TABLET | Freq: Two times a day (BID) | ORAL | Status: DC
  Administered 2021-07-18 – 2021-07-19 (×3): 500 mg via ORAL
  Filled 2021-07-18 (×4): qty 2

## 2021-07-18 MED ORDER — OXYCODONE-ACETAMINOPHEN 5-325 MG PO TABS: 1.0000 | ORAL_TABLET | ORAL | Status: DC | PRN

## 2021-07-18 MED ORDER — GADOBUTROL 1 MMOL/ML IV SOLN
7.0000 mL | Freq: Once | INTRAVENOUS | Status: AC | PRN
  Administered 2021-07-18: 7 mL via INTRAVENOUS

## 2021-07-18 MED ORDER — MORPHINE SULFATE (PF) 2 MG/ML IV SOLN: 2.0000 mg | INTRAVENOUS | Status: DC | PRN

## 2021-07-18 MED ORDER — VANCOMYCIN HCL 1250 MG/250ML IV SOLN
1250.0000 mg | INTRAVENOUS | Status: DC
  Filled 2021-07-18 (×2): qty 250

## 2021-07-18 MED ORDER — POTASSIUM CHLORIDE CRYS ER 20 MEQ PO TBCR
40.0000 meq | EXTENDED_RELEASE_TABLET | Freq: Once | ORAL | Status: AC
  Administered 2021-07-18: 40 meq via ORAL
  Filled 2021-07-18: qty 2

## 2021-07-18 MED ORDER — ENOXAPARIN SODIUM 40 MG/0.4ML IJ SOSY
40.0000 mg | PREFILLED_SYRINGE | INTRAMUSCULAR | Status: DC
  Filled 2021-07-18 (×2): qty 0.4

## 2021-07-18 NOTE — Progress Notes (Addendum)
PROGRESS NOTE        PATIENT DETAILS Name: DEMAYA Hicks Age: 70 y.o. Sex: female Date of Birth: 17-Dec-1950 Admit Date: 07/17/2021 Admitting Physician Vernelle Emerald, MD FMB:WGYK, Hunt Oris, MD  Brief Narrative: Patient is a 70 y.o. female with history of HLD, GERD-who presented with sudden onset of severe left wrist pain.  Subsequently admitted to the hospitalist service for further evaluation and treatment.  Subjective: Left wrist is exquisitely tender-she will not even allow any of range of motion.  Left wrist appears swollen but without any significant overlying erythema.  Objective: Vitals: Blood pressure (!) 124/51, pulse 67, temperature 97.8 F (36.6 C), temperature source Oral, resp. rate 20, weight 72.6 kg, SpO2 98 %.   Exam: Gen Exam:Alert awake-not in any distress HEENT:atraumatic, normocephalic Chest: B/L clear to auscultation anteriorly CVS:S1S2 regular Abdomen:soft non tender, non distended Extremities:no edema.  Left wrist dorsum swollen-no significant erythema-exquisitely tender-we will not allow passive range of motion due to tenderness. Neurology: Non focal Skin: no rash  Pertinent Labs/Radiology: WBC: 8.4 Hb: 12.3 Na: 140 K: 3.1 Creatinine: 0.69  10/21 >>CRP: 1.0 10/21>> uric acid: 5.4  10/20>> blood culture: Pending  10/20>> x-ray left wrist: No acute bony abnormality   Assessment/Plan: Left wrist pain/swelling: Unclear etiology-no history of trauma-septic arthritis less likely-suspicion for inflammatory arthropathy-however CRP only marginally elevated.  Continue scheduled naproxen, empiric vancomycin-await MRI of the left wrist.  Continue as needed narcotics for severe breakthrough pain.  Appreciate plastic/hand surgery evaluation-we will await further recommendations.  Hypokalemia/hypomagnesemia: Replete and recheck  HLD: Continue statin  GERD: Continue PPI  Procedures: None Consults: None DVT Prophylaxis:  Lovenox Code Status:Full code  Family Communication: None at bedside  Time spent: 35 minutes-Greater than 50% of this time was spent in counseling, explanation of diagnosis, planning of further management, and coordination of care.  Diet: Diet Order             Diet NPO time specified Except for: Ice Chips, Sips with Meds  Diet effective ____                      Disposition Plan: Status is: Observation  The patient will require care spanning > 2 midnights and should be moved to inpatient because: IV level of treatment with IV medications.   Barriers to Discharge: Severe left wrist pain-suspicion for inflammatory arthritis-however septic arthritis not ruled out-awaiting MRI of the left wrist-on IV antibiotics.  Not yet stable for discharge.  Antimicrobial agents: Anti-infectives (From admission, onward)    Start     Dose/Rate Route Frequency Ordered Stop   07/18/21 2130  vancomycin (VANCOREADY) IVPB 1250 mg/250 mL        1,250 mg 166.7 mL/hr over 90 Minutes Intravenous Every 24 hours 07/18/21 1012     07/18/21 1000  vancomycin (VANCOCIN) IVPB 1000 mg/200 mL premix  Status:  Discontinued        1,000 mg 200 mL/hr over 60 Minutes Intravenous Every 12 hours 07/17/21 2055 07/18/21 1011   07/17/21 2100  vancomycin (VANCOREADY) IVPB 1250 mg/250 mL        1,250 mg 166.7 mL/hr over 90 Minutes Intravenous  Once 07/17/21 2048 07/18/21 0214        MEDICATIONS: Scheduled Meds:  naproxen  500 mg Oral BID WC   pantoprazole  40 mg Oral Daily  rosuvastatin  20 mg Oral Daily   Continuous Infusions:  lactated ringers Stopped (07/18/21 1006)   vancomycin     PRN Meds:.acetaminophen **OR** acetaminophen, melatonin, oxyCODONE-acetaminophen **OR** morphine injection, ondansetron **OR** ondansetron (ZOFRAN) IV, polyethylene glycol   I have personally reviewed following labs and imaging studies  LABORATORY DATA: CBC: Recent Labs  Lab 07/17/21 1656 07/17/21 1716  07/18/21 0257  WBC 11.1*  --  8.4  NEUTROABS 7.9*  --  4.9  HGB 13.2 13.6 12.3  HCT 40.9 40.0 38.2  MCV 96.0  --  96.5  PLT 267  --  350    Basic Metabolic Panel: Recent Labs  Lab 07/17/21 1656 07/17/21 1716 07/18/21 0257  NA 139 141 140  K 3.2* 3.2* 3.1*  CL 104 103 109  CO2 26  --  22  GLUCOSE 98 95 104*  BUN 5* 4* 6*  CREATININE 0.68 0.60 0.69  CALCIUM 8.9  --  8.6*  MG  --   --  1.9    GFR: Estimated Creatinine Clearance: 67.2 mL/min (by C-G formula based on SCr of 0.69 mg/dL).  Liver Function Tests: Recent Labs  Lab 07/17/21 1656 07/18/21 0257  AST 25 21  ALT 28 22  ALKPHOS 74 61  BILITOT 0.6 0.8  PROT 6.7 5.7*  ALBUMIN 3.7 3.1*   No results for input(s): LIPASE, AMYLASE in the last 168 hours. No results for input(s): AMMONIA in the last 168 hours.  Coagulation Profile: Recent Labs  Lab 07/17/21 1656  INR 1.0    Cardiac Enzymes: Recent Labs  Lab 07/17/21 1656  CKTOTAL 54    BNP (last 3 results) No results for input(s): PROBNP in the last 8760 hours.  Lipid Profile: No results for input(s): CHOL, HDL, LDLCALC, TRIG, CHOLHDL, LDLDIRECT in the last 72 hours.  Thyroid Function Tests: No results for input(s): TSH, T4TOTAL, FREET4, T3FREE, THYROIDAB in the last 72 hours.  Anemia Panel: No results for input(s): VITAMINB12, FOLATE, FERRITIN, TIBC, IRON, RETICCTPCT in the last 72 hours.  Urine analysis:    Component Value Date/Time   COLORURINE YELLOW 07/18/2021 0259   APPEARANCEUR CLEAR 07/18/2021 0259   LABSPEC 1.011 07/18/2021 0259   PHURINE 5.0 07/18/2021 0259   GLUCOSEU NEGATIVE 07/18/2021 0259   GLUCOSEU NEGATIVE 01/23/2021 1207   HGBUR SMALL (A) 07/18/2021 0259   HGBUR moderate 07/09/2010 1546   BILIRUBINUR NEGATIVE 07/18/2021 0259   KETONESUR NEGATIVE 07/18/2021 0259   PROTEINUR NEGATIVE 07/18/2021 0259   UROBILINOGEN 2.0 (A) 01/23/2021 1207   NITRITE NEGATIVE 07/18/2021 0259   LEUKOCYTESUR TRACE (A) 07/18/2021 0259     Sepsis Labs: Lactic Acid, Venous No results found for: LATICACIDVEN  MICROBIOLOGY: Recent Results (from the past 240 hour(s))  Resp Panel by RT-PCR (Flu A&B, Covid) Nasopharyngeal Swab     Status: None   Collection Time: 07/17/21  8:18 PM   Specimen: Nasopharyngeal Swab; Nasopharyngeal(NP) swabs in vial transport medium  Result Value Ref Range Status   SARS Coronavirus 2 by RT PCR NEGATIVE NEGATIVE Final    Comment: (NOTE) SARS-CoV-2 target nucleic acids are NOT DETECTED.  The SARS-CoV-2 RNA is generally detectable in upper respiratory specimens during the acute phase of infection. The lowest concentration of SARS-CoV-2 viral copies this assay can detect is 138 copies/mL. A negative result does not preclude SARS-Cov-2 infection and should not be used as the sole basis for treatment or other patient management decisions. A negative result may occur with  improper specimen collection/handling, submission of specimen other than nasopharyngeal  swab, presence of viral mutation(s) within the areas targeted by this assay, and inadequate number of viral copies(<138 copies/mL). A negative result must be combined with clinical observations, patient history, and epidemiological information. The expected result is Negative.  Fact Sheet for Patients:  EntrepreneurPulse.com.au  Fact Sheet for Healthcare Providers:  IncredibleEmployment.be  This test is no t yet approved or cleared by the [place] FDA and  has been authorized for detection and/or diagnosis of SARS-CoV-2 by FDA under an Emergency Use Authorization (EUA). This EUA will remain  in effect (meaning this test can be used) for the duration of the COVID-19 declaration under Section 564(b)(1) of the Act, 21 U.S.C.section 360bbb-3(b)(1), unless the authorization is terminated  or revoked sooner.       Influenza A by PCR NEGATIVE NEGATIVE Final   Influenza B by PCR NEGATIVE NEGATIVE  Final    Comment: (NOTE) The Xpert Xpress SARS-CoV-2/FLU/RSV plus assay is intended as an aid in the diagnosis of influenza from Nasopharyngeal swab specimens and should not be used as a sole basis for treatment. Nasal washings and aspirates are unacceptable for Xpert Xpress SARS-CoV-2/FLU/RSV testing.  Fact Sheet for Patients: EntrepreneurPulse.com.au  Fact Sheet for Healthcare Providers: IncredibleEmployment.be  This test is not yet approved or cleared by the [place] FDA and has been authorized for detection and/or diagnosis of SARS-CoV-2 by FDA under an Emergency Use Authorization (EUA). This EUA will remain in effect (meaning this test can be used) for the duration of the COVID-19 declaration under Section 564(b)(1) of the Act, 21 U.S.C. section 360bbb-3(b)(1), unless the authorization is terminated or revoked.  Performed at [facility], [place] [address] [place], [place] [address]     RADIOLOGY STUDIES/RESULTS: DG Wrist Complete Left  Result Date: 07/17/2021 CLINICAL DATA:  Left wrist pain.  No known injury. EXAM: LEFT WRIST - COMPLETE 3+ VIEW COMPARISON:  None. FINDINGS: Degenerative changes of the 1st carpometacarpal joint. No acute bony abnormality. Specifically, no fracture, subluxation, or dislocation. IMPRESSION: No acute bony abnormality. Electronically Signed   By: Rolm Baptise M.D.   On: 07/17/2021 18:36   CT HEAD WO CONTRAST  Result Date: 07/17/2021 CLINICAL DATA:  TIA EXAM: CT HEAD WITHOUT CONTRAST TECHNIQUE: Contiguous axial images were obtained from the base of the skull through the vertex without intravenous contrast. COMPARISON:  04/12/2019 FINDINGS: Brain: No acute intracranial abnormality. Specifically, no hemorrhage, hydrocephalus, mass lesion, acute infarction, or significant intracranial injury. Vascular: No hyperdense vessel or unexpected calcification. Skull: No acute calvarial abnormality. Sinuses/Orbits:  Mucosal thickening in the right maxillary sinus. No air-fluid levels. Other: None IMPRESSION: No acute intracranial abnormality. Electronically Signed   By: Rolm Baptise M.D.   On: 07/17/2021 18:37     LOS: 0 days   Oren Binet, MD  [facility]    To contact the attending provider between 7A-7P or the covering provider during after hours 7P-7A, please log into the web site [email or web address] and access using universal [place] password for that web site. If you do not have the password, please call the hospital operator.  07/18/2021, 11:24 AM

## 2021-07-18 NOTE — Assessment & Plan Note (Addendum)
   Sudden severe pain and swelling of the left wrist with some extension into the hand and forearm that has been ongoing for approximately 24 hours  Exam reveals mild associated swelling with warmth and severely reduced range of motion of the wrist and fingers.  Patient additionally exhibiting multiple SIRS criteria with leukocytosis and fever.  Patient additionally has slightly elevated CRP  ER provider discussed case with Dr. Claudia Desanctis with orthopedic hand who feels that septic arthritis is unlikely but will evaluate patient the morning of 10/21 and determine if arthrocentesis is necessary.  In the meantime they recommend intravenous antibiotics per the ER provider.  ER provider has initiated intravenous vancomycin which will be continued.  No history of gout, obtaining uric acid level.  Placing on naproxen 500 mg p.o. twice daily.  Obtaining MRI of the wrist to evaluate for any evidence of phlegmon, abscess formation or tenosynovitis  While unlikely, will make patient n.p.o. in case of surgical intervention  As needed opiate-based analgesics for associated substantial pain.

## 2021-07-18 NOTE — ED Notes (Signed)
Pt transported to MRI 

## 2021-07-18 NOTE — ED Notes (Signed)
Pt up and ambulatory to the bathroom. Pt changing into a hospital gown. Will continue to monitor.

## 2021-07-18 NOTE — Assessment & Plan Note (Signed)
   SIRS present with fever and leukocytosis  Actively evaluating for infectious process as noted above  No clinical evidence of sepsis/organ dysfunction at this time.

## 2021-07-18 NOTE — Plan of Care (Signed)

## 2021-07-18 NOTE — Assessment & Plan Note (Signed)
.   Continuing home regimen of lipid lowering therapy.  

## 2021-07-18 NOTE — Progress Notes (Signed)
Pharmacy Antibiotic Note  Amber Hicks is a 70 y.o. female admitted on 07/17/2021 with possible sepsis.  Pharmacy has been consulted for vancomycin dosing. Last weight was 72.7 kg on 07/01/21. She is afebrile with WBC 11.1 and SCr 0.60.   Plan: Vancomycin 1250 mg IV once as a loading dose Followed by vancomycin 1250mg  IV q24h      eAUC 435.0 using SCr rounded to 0.8 Monitor vancomycin levels, renal function, cultures, and clinical improvement  Temp (24hrs), Avg:99.4 F (37.4 C), Min:97.8 F (36.6 C), Max:100.5 F (38.1 C)  Recent Labs  Lab 07/17/21 1656 07/17/21 1716 07/18/21 0257  WBC 11.1*  --  8.4  CREATININE 0.68 0.60 0.69     Estimated Creatinine Clearance: 67.2 mL/min (by C-G formula based on SCr of 0.69 mg/dL).    Allergies  Allergen Reactions   Doxycycline Rash    Antimicrobials this admission: Vancomycin 10/20>>  Microbiology results: 10/20 BCx: in process  Thank you for allowing pharmacy to be a part of this patient's care.  Joetta Manners, PharmD, [facility] Clinical Pharmacist ED RPh Phone: [place]: [phone]

## 2021-07-18 NOTE — Consult Note (Signed)
Reason for Consult/CC: Left wrist pain  Amber Hicks is an 70 y.o. female.  HPI: Patient presented yesterday with new onset of left wrist pain.  She denies any trauma or puncture to the hand or wrist area.  Denies previous symptoms of that nature in the hand or wrist.  She reports quite a bit of pain with flexion extension of the wrist in flexion and extension of the fingers.  Also reports some mild tingling in the fingers themselves.  X-rays been done which was normal showing no acute pathology.  MRI has been ordered.  She had an initial temperature of 100.5.  That subsequently come down to normal fairly quickly.  She had initial white count measurement of 11 and that has since come down to 8.  She has been getting NSAIDs and antibiotics overnight.  She feels about the same as yesterday.  Allergies:  Allergies  Allergen Reactions   Doxycycline Rash    Medications:  Current Facility-Administered Medications:    acetaminophen (TYLENOL) tablet 650 mg, 650 mg, Oral, Q6H PRN **OR** acetaminophen (TYLENOL) suppository 650 mg, 650 mg, Rectal, Q6H PRN, Shalhoub, Sherryll Burger, MD   lactated ringers infusion, , Intravenous, Continuous, Shalhoub, Sherryll Burger, MD, Last Rate: 100 mL/hr at 07/18/21 0751, Rate Verify at 07/18/21 0751   melatonin tablet 10 mg, 10 mg, Oral, QHS PRN, Shalhoub, Sherryll Burger, MD   oxyCODONE-acetaminophen (PERCOCET/ROXICET) 5-325 MG per tablet 1 tablet, 1 tablet, Oral, Q4H PRN **OR** morphine 2 MG/ML injection 2 mg, 2 mg, Intravenous, Q4H PRN, Shalhoub, Sherryll Burger, MD   naproxen (NAPROSYN) tablet 500 mg, 500 mg, Oral, BID WC, Shalhoub, Sherryll Burger, MD, 500 mg at 07/18/21 0819   ondansetron (ZOFRAN) tablet 4 mg, 4 mg, Oral, Q6H PRN **OR** ondansetron (ZOFRAN) injection 4 mg, 4 mg, Intravenous, Q6H PRN, Shalhoub, Sherryll Burger, MD   pantoprazole (PROTONIX) EC tablet 40 mg, 40 mg, Oral, Daily, Shalhoub, Sherryll Burger, MD   polyethylene glycol (MIRALAX / GLYCOLAX) packet 17 g, 17 g, Oral, Daily PRN,  Shalhoub, Sherryll Burger, MD   rosuvastatin (CRESTOR) tablet 20 mg, 20 mg, Oral, Daily, Shalhoub, Sherryll Burger, MD   vancomycin (VANCOCIN) IVPB 1000 mg/200 mL premix, 1,000 mg, Intravenous, Q12H, Shalhoub, Sherryll Burger, MD  Current Outpatient Medications:    acetaminophen (TYLENOL) 325 MG tablet, Take 650 mg by mouth every 6 (six) hours as needed for mild pain., Disp: , Rfl:    ibuprofen (ADVIL) 600 MG tablet, Take 1 tablet (600 mg total) by mouth every 8 (eight) hours as needed for headache., Disp: 30 tablet, Rfl: 0   LORazepam (ATIVAN) 0.5 MG tablet, 1-2 tabs 30 - 60 min prior to shot. Do not drive with this medicine. (Patient taking differently: Take 0.5 mg by mouth every 6 (six) hours as needed for anxiety.), Disp: 4 tablet, Rfl: 0   omeprazole (PRILOSEC) 40 MG capsule, Take 40 mg by mouth daily., Disp: , Rfl:    rosuvastatin (CRESTOR) 20 MG tablet, Take 1 tablet (20 mg total) by mouth daily., Disp: 90 tablet, Rfl: 3   lidocaine-prilocaine (EMLA) cream, Apply 1 application topically as needed. Apply to the elbow and cover with saran wrap 1 hour prior to the shot (Patient not taking: Reported on 07/17/2021), Disp: 30 g, Rfl: 1   pantoprazole (PROTONIX) 40 MG tablet, Take 1 tablet (40 mg total) by mouth daily. (Patient not taking: Reported on 07/17/2021), Disp: 90 tablet, Rfl: 3  Past Medical History:  Diagnosis Date   Depression 01/22/2012   GERD  without esophagitis 12/10/2020   Hx of adenomatous colonic polyps 01/26/2015   Hyperlipidemia 01/24/2012   Mixed hyperlipidemia 01/24/2012   VITAMIN D DEFICIENCY 07/09/2010   Qualifier: Diagnosis of  By: Jenny Reichmann MD, Hunt Oris     Past Surgical History:  Procedure Laterality Date   ceaserian section     1 time    Family History  Problem Relation Age of Onset   Heart disease Mother    Heart disease Brother    Colon cancer Neg Hx     Social History:  reports that she has never smoked. She has never used smokeless tobacco. She reports that she does not drink  alcohol and does not use drugs.  Physical Exam Blood pressure 126/68, pulse (!) 59, temperature 97.8 F (36.6 C), temperature source Oral, resp. rate 16, SpO2 99 %. General: No acute distress Left hand: Fingers well-perfused normal capillary refill to palp radial pulse.  Sensation is intact throughout with some subjective tingling.  She has limited active flexion or extension of the wrist and fingers due to swelling.  I can passively extend them although it does cause quite a bit of discomfort for her.  Very minimal swelling.  Very minimal if any erythema at this point.  No wounds.  Results for orders placed or performed during the hospital encounter of 07/17/21 (from the past 48 hour(s))  CK     Status: None   Collection Time: 07/17/21  4:56 PM  Result Value Ref Range   Total CK 54 38 - 234 U/L    Comment: Performed at [facility], [place] [address] [place], [place] [address]  Ethanol     Status: None   Collection Time: 07/17/21  4:56 PM  Result Value Ref Range   Alcohol, Ethyl (B) <10 <10 mg/dL    Comment: (NOTE) Lowest detectable limit for serum alcohol is 10 mg/dL.  For medical purposes only. Performed at [facility], [place] [address] [place], [place] [address]   Protime-INR     Status: None   Collection Time: 07/17/21  4:56 PM  Result Value Ref Range   Prothrombin Time 13.0 11.4 - 15.2 seconds   INR 1.0 0.8 - 1.2    Comment: (NOTE) INR goal varies based on device and disease states. Performed at [facility], [place] [address] [place], [place] [address]   APTT     Status: None   Collection Time: 07/17/21  4:56 PM  Result Value Ref Range   aPTT 34 24 - 36 seconds    Comment: Performed at [facility] [address] [place], [place] [address]  CBC     Status: Abnormal   Collection Time: 07/17/21  4:56 PM  Result Value Ref Range   WBC 11.1 (H) 4.0 - 10.5 K/uL   RBC 4.26 3.87 - 5.11 MIL/uL   Hemoglobin 13.2 12.0 - 15.0 g/dL   HCT 40.9 36.0 - 46.0  %   MCV 96.0 80.0 - 100.0 fL   MCH 31.0 26.0 - 34.0 pg   MCHC 32.3 30.0 - 36.0 g/dL   RDW 13.2 11.5 - 15.5 %   Platelets 267 150 - 400 K/uL   nRBC 0.0 0.0 - 0.2 %    Comment: Performed at [facility], [place] [address] [place], [place] [address]  Differential     Status: Abnormal   Collection Time: 07/17/21  4:56 PM  Result Value Ref Range   Neutrophils Relative %  72 %   Neutro Abs 7.9 (H) 1.7 - 7.7 K/uL   Lymphocytes Relative 20 %   Lymphs Abs 2.2 0.7 - 4.0 K/uL   Monocytes Relative 7 %   Monocytes Absolute 0.8 0.1 - 1.0 K/uL   Eosinophils Relative 1 %   Eosinophils Absolute 0.1 0.0 - 0.5 K/uL   Basophils Relative 0 %   Basophils Absolute 0.0 0.0 - 0.1 K/uL   Immature Granulocytes 0 %   Abs Immature Granulocytes 0.03 0.00 - 0.07 K/uL    Comment: Performed at [facility] [address], [place], [place] [address]  Comprehensive metabolic panel     Status: Abnormal   Collection Time: 07/17/21  4:56 PM  Result Value Ref Range   Sodium 139 135 - 145 mmol/L   Potassium 3.2 (L) 3.5 - 5.1 mmol/L   Chloride 104 98 - 111 mmol/L   CO2 26 22 - 32 mmol/L   Glucose, Bld 98 70 - 99 mg/dL    Comment: Glucose reference range applies only to samples taken after fasting for at least 8 hours.   BUN 5 (L) 8 - 23 mg/dL   Creatinine, Ser 0.68 0.44 - 1.00 mg/dL   Calcium 8.9 8.9 - 10.3 mg/dL   Total Protein 6.7 6.5 - 8.1 g/dL   Albumin 3.7 3.5 - 5.0 g/dL   AST 25 15 - 41 U/L   ALT 28 0 - 44 U/L   Alkaline Phosphatase 74 38 - 126 U/L   Total Bilirubin 0.6 0.3 - 1.2 mg/dL   GFR, Estimated >60 >60 mL/min    Comment: (NOTE) Calculated using the CKD-EPI Creatinine Equation (2021)    Anion gap 9 5 - 15    Comment: Performed at [facility] [address] [place], [place] [address]  I-stat chem 8, ED     Status: Abnormal   Collection Time: 07/17/21  5:16 PM  Result Value Ref Range   Sodium 141 135 - 145 mmol/L   Potassium 3.2 (L) 3.5 - 5.1 mmol/L   Chloride 103 98 - 111  mmol/L   BUN 4 (L) 8 - 23 mg/dL   Creatinine, Ser 0.60 0.44 - 1.00 mg/dL   Glucose, Bld 95 70 - 99 mg/dL    Comment: Glucose reference range applies only to samples taken after fasting for at least 8 hours.   Calcium, Ion 1.12 (L) 1.15 - 1.40 mmol/L   TCO2 25 22 - 32 mmol/L   Hemoglobin 13.6 12.0 - 15.0 g/dL   HCT 40.0 36.0 - 46.0 %  Sedimentation rate     Status: None   Collection Time: 07/17/21  7:42 PM  Result Value Ref Range   Sed Rate 16 0 - 22 mm/hr    Comment: Performed at [facility] [address], [place], [place] [address]  C-reactive protein     Status: Abnormal   Collection Time: 07/17/21  7:42 PM  Result Value Ref Range   CRP 1.0 (H) <1.0 mg/dL    Comment: Performed at [facility] [address] [place], [place] [address]  Resp Panel by RT-PCR (Flu A&B, Covid) Nasopharyngeal Swab     Status: None   Collection Time: 07/17/21  8:18 PM   Specimen: Nasopharyngeal Swab; Nasopharyngeal(NP) swabs in vial transport medium  Result Value Ref Range   SARS Coronavirus 2 by RT PCR NEGATIVE NEGATIVE    Comment: (NOTE) SARS-CoV-2 target nucleic acids are NOT DETECTED.  The SARS-CoV-2  RNA is generally detectable in upper respiratory specimens during the acute phase of infection. The lowest concentration of SARS-CoV-2 viral copies this assay can detect is 138 copies/mL. A negative result does not preclude SARS-Cov-2 infection and should not be used as the sole basis for treatment or other patient management decisions. A negative result may occur with  improper specimen collection/handling, submission of specimen other than nasopharyngeal swab, presence of viral mutation(s) within the areas targeted by this assay, and inadequate number of viral copies(<138 copies/mL). A negative result must be combined with clinical observations, patient history, and epidemiological information. The expected result is Negative.  Fact Sheet for Patients:   EntrepreneurPulse.com.au  Fact Sheet for Healthcare Providers:  IncredibleEmployment.be  This test is no t yet approved or cleared by the [place] FDA and  has been authorized for detection and/or diagnosis of SARS-CoV-2 by FDA under an Emergency Use Authorization (EUA). This EUA will remain  in effect (meaning this test can be used) for the duration of the COVID-19 declaration under Section 564(b)(1) of the Act, 21 U.S.C.section 360bbb-3(b)(1), unless the authorization is terminated  or revoked sooner.       Influenza A by PCR NEGATIVE NEGATIVE   Influenza B by PCR NEGATIVE NEGATIVE    Comment: (NOTE) The Xpert Xpress SARS-CoV-2/FLU/RSV plus assay is intended as an aid in the diagnosis of influenza from Nasopharyngeal swab specimens and should not be used as a sole basis for treatment. Nasal washings and aspirates are unacceptable for Xpert Xpress SARS-CoV-2/FLU/RSV testing.  Fact Sheet for Patients: EntrepreneurPulse.com.au  Fact Sheet for Healthcare Providers: IncredibleEmployment.be  This test is not yet approved or cleared by the [place] FDA and has been authorized for detection and/or diagnosis of SARS-CoV-2 by FDA under an Emergency Use Authorization (EUA). This EUA will remain in effect (meaning this test can be used) for the duration of the COVID-19 declaration under Section 564(b)(1) of the Act, 21 U.S.C. section 360bbb-3(b)(1), unless the authorization is terminated or revoked.  Performed at [facility], [place] [address], [place], [place] [address]   Uric acid     Status: None   Collection Time: 07/18/21  2:57 AM  Result Value Ref Range   Uric Acid, Serum 5.4 2.5 - 7.1 mg/dL    Comment: Performed at [facility] [address], [place], [place] [address]  Comprehensive metabolic panel     Status: Abnormal   Collection Time: 07/18/21  2:57 AM  Result Value Ref Range    Sodium 140 135 - 145 mmol/L   Potassium 3.1 (L) 3.5 - 5.1 mmol/L   Chloride 109 98 - 111 mmol/L   CO2 22 22 - 32 mmol/L   Glucose, Bld 104 (H) 70 - 99 mg/dL    Comment: Glucose reference range applies only to samples taken after fasting for at least 8 hours.   BUN 6 (L) 8 - 23 mg/dL   Creatinine, Ser 0.69 0.44 - 1.00 mg/dL   Calcium 8.6 (L) 8.9 - 10.3 mg/dL   Total Protein 5.7 (L) 6.5 - 8.1 g/dL   Albumin 3.1 (L) 3.5 - 5.0 g/dL   AST 21 15 - 41 U/L   ALT 22 0 - 44 U/L   Alkaline Phosphatase 61 38 - 126 U/L   Total Bilirubin 0.8 0.3 - 1.2 mg/dL   GFR, Estimated >60 >60 mL/min    Comment: (NOTE) Calculated using the CKD-EPI Creatinine Equation (2021)    Anion gap 9 5 - 15  Comment: Performed at [facility], [place] [address], [place], [place] [address]  Magnesium     Status: None   Collection Time: 07/18/21  2:57 AM  Result Value Ref Range   Magnesium 1.9 1.7 - 2.4 mg/dL    Comment: Performed at [facility] [address], [place], [place] [address]  CBC WITH DIFFERENTIAL     Status: None   Collection Time: 07/18/21  2:57 AM  Result Value Ref Range   WBC 8.4 4.0 - 10.5 K/uL   RBC 3.96 3.87 - 5.11 MIL/uL   Hemoglobin 12.3 12.0 - 15.0 g/dL   HCT 38.2 36.0 - 46.0 %   MCV 96.5 80.0 - 100.0 fL   MCH 31.1 26.0 - 34.0 pg   MCHC 32.2 30.0 - 36.0 g/dL   RDW 13.2 11.5 - 15.5 %   Platelets 223 150 - 400 K/uL   nRBC 0.0 0.0 - 0.2 %   Neutrophils Relative % 58 %   Neutro Abs 4.9 1.7 - 7.7 K/uL   Lymphocytes Relative 29 %   Lymphs Abs 2.4 0.7 - 4.0 K/uL   Monocytes Relative 10 %   Monocytes Absolute 0.9 0.1 - 1.0 K/uL   Eosinophils Relative 2 %   Eosinophils Absolute 0.1 0.0 - 0.5 K/uL   Basophils Relative 1 %   Basophils Absolute 0.1 0.0 - 0.1 K/uL   Immature Granulocytes 0 %   Abs Immature Granulocytes 0.02 0.00 - 0.07 K/uL    Comment: Performed at [facility], [address], [place], [place] [address]  Urine rapid drug screen (hosp performed)      Status: None   Collection Time: 07/18/21  2:59 AM  Result Value Ref Range   Opiates NONE DETECTED NONE DETECTED   Cocaine NONE DETECTED NONE DETECTED   Benzodiazepines NONE DETECTED NONE DETECTED   Amphetamines NONE DETECTED NONE DETECTED   Tetrahydrocannabinol NONE DETECTED NONE DETECTED   Barbiturates NONE DETECTED NONE DETECTED    Comment: (NOTE) DRUG SCREEN FOR MEDICAL PURPOSES ONLY.  IF CONFIRMATION IS NEEDED FOR ANY PURPOSE, NOTIFY LAB WITHIN 5 DAYS.  LOWEST DETECTABLE LIMITS FOR URINE DRUG SCREEN Drug Class                     Cutoff (ng/mL) Amphetamine and metabolites    1000 Barbiturate and metabolites    200 Benzodiazepine                 992 Tricyclics and metabolites     300 Opiates and metabolites        300 Cocaine and metabolites        300 THC                            50 Performed at [facility], [place] [address], [place], [place] [address]   Urinalysis, Routine w reflex microscopic Urine, Clean Catch     Status: Abnormal   Collection Time: 07/18/21  2:59 AM  Result Value Ref Range   Color, Urine YELLOW YELLOW   APPearance CLEAR CLEAR   Specific Gravity, Urine 1.011 1.005 - 1.030   pH 5.0 5.0 - 8.0   Glucose, UA NEGATIVE NEGATIVE mg/dL   Hgb urine dipstick SMALL (A) NEGATIVE   Bilirubin Urine NEGATIVE NEGATIVE   Ketones, ur NEGATIVE NEGATIVE mg/dL   Protein, ur NEGATIVE NEGATIVE mg/dL   Nitrite NEGATIVE NEGATIVE   Leukocytes,Ua  TRACE (A) NEGATIVE   RBC / HPF 0-5 0 - 5 RBC/hpf   WBC, UA 11-20 0 - 5 WBC/hpf   Bacteria, UA NONE SEEN NONE SEEN   Squamous Epithelial / LPF 0-5 0 - 5    Comment: Performed at [facility], [place] [address], [place], [place] [address]    DG Wrist Complete Left  Result Date: 07/17/2021 CLINICAL DATA:  Left wrist pain.  No known injury. EXAM: LEFT WRIST - COMPLETE 3+ VIEW COMPARISON:  None. FINDINGS: Degenerative changes of the 1st carpometacarpal joint. No acute bony abnormality. Specifically, no fracture,  subluxation, or dislocation. IMPRESSION: No acute bony abnormality. Electronically Signed   By: Rolm Baptise M.D.   On: 07/17/2021 18:36   CT HEAD WO CONTRAST  Result Date: 07/17/2021 CLINICAL DATA:  TIA EXAM: CT HEAD WITHOUT CONTRAST TECHNIQUE: Contiguous axial images were obtained from the base of the skull through the vertex without intravenous contrast. COMPARISON:  04/12/2019 FINDINGS: Brain: No acute intracranial abnormality. Specifically, no hemorrhage, hydrocephalus, mass lesion, acute infarction, or significant intracranial injury. Vascular: No hyperdense vessel or unexpected calcification. Skull: No acute calvarial abnormality. Sinuses/Orbits: Mucosal thickening in the right maxillary sinus. No air-fluid levels. Other: None IMPRESSION: No acute intracranial abnormality. Electronically Signed   By: Rolm Baptise M.D.   On: 07/17/2021 18:37    Assessment/Plan: Patient presents with left wrist pain.  I would favor inflammatory arthritis with this presentation.  MRIs been ordered so we will follow-up the results of that.  Low suspicion for septic arthritis.  Agree with continued antibiotics and anti-inflammatories.  We will follow along.  Cindra Presume 07/18/2021, 9:41 AM

## 2021-07-18 NOTE — Assessment & Plan Note (Signed)
Continuing home regimen of daily PPI therapy.  

## 2021-07-18 NOTE — Assessment & Plan Note (Signed)
·   Replacing with potassium chloride °· Evaluating for concurrent hypomagnesemia  °· Monitoring potassium levels with serial chemistries. ° °

## 2021-07-19 DIAGNOSIS — K219 Gastro-esophageal reflux disease without esophagitis: Secondary | ICD-10-CM | POA: Diagnosis not present

## 2021-07-19 DIAGNOSIS — E876 Hypokalemia: Secondary | ICD-10-CM | POA: Diagnosis not present

## 2021-07-19 DIAGNOSIS — M25532 Pain in left wrist: Secondary | ICD-10-CM | POA: Diagnosis not present

## 2021-07-19 LAB — COMPREHENSIVE METABOLIC PANEL
ALT: 21 U/L (ref 0–44)
AST: 21 U/L (ref 15–41)
Albumin: 2.9 g/dL — ABNORMAL LOW (ref 3.5–5.0)
Alkaline Phosphatase: 60 U/L (ref 38–126)
Anion gap: 7 (ref 5–15)
BUN: 13 mg/dL (ref 8–23)
CO2: 23 mmol/L (ref 22–32)
Calcium: 8.7 mg/dL — ABNORMAL LOW (ref 8.9–10.3)
Chloride: 111 mmol/L (ref 98–111)
Creatinine, Ser: 0.71 mg/dL (ref 0.44–1.00)
GFR, Estimated: >60 mL/min (ref >60)
Glucose, Bld: 101 mg/dL — ABNORMAL HIGH (ref 70–99)
Potassium: 3.9 mmol/L (ref 3.5–5.1)
Sodium: 141 mmol/L (ref 135–145)
Total Bilirubin: 0.5 mg/dL (ref 0.3–1.2)
Total Protein: 5.5 g/dL — ABNORMAL LOW (ref 6.5–8.1)

## 2021-07-19 LAB — CBC
HCT: 38.5 % (ref 36.0–46.0)
Hemoglobin: 12.6 g/dL (ref 12.0–15.0)
MCH: 30.9 pg (ref 26.0–34.0)
MCHC: 32.7 g/dL (ref 30.0–36.0)
MCV: 94.4 fL (ref 80.0–100.0)
Platelets: 214 10*3/uL (ref 150–400)
RBC: 4.08 MIL/uL (ref 3.87–5.11)
RDW: 13.2 % (ref 11.5–15.5)
WBC: 6.1 10*3/uL (ref 4.0–10.5)
nRBC: 0 % (ref 0.0–0.2)

## 2021-07-19 LAB — C-REACTIVE PROTEIN: CRP: 7.1 mg/dL — ABNORMAL HIGH (ref <1.0)

## 2021-07-19 LAB — MAGNESIUM: Magnesium: 1.9 mg/dL (ref 1.7–2.4)

## 2021-07-19 MED ORDER — NAPROXEN 500 MG PO TABS: 500.0000 mg | ORAL_TABLET | Freq: Two times a day (BID) | ORAL | 0 refills | Status: AC

## 2021-07-19 MED ORDER — DICLOFENAC SODIUM 1 % EX GEL: 2.0000 g | Freq: Four times a day (QID) | CUTANEOUS | 0 refills | Status: AC

## 2021-07-19 NOTE — Discharge Summary (Signed)
PATIENT DETAILS Name: Amber Hicks Age: 70 y.o. Sex: female Date of Birth: 11-Apr-1951 MRN: [id number]. Admitting Physician: Vernelle Emerald, MD [id number], Hunt Oris, MD  Admit Date: 07/17/2021 Discharge date: 07/19/2021  Recommendations for Outpatient Follow-up:  Follow up with PCP in 1-2 weeks Please obtain CMP/CBC in one week   Admitted From:  Home  Disposition: [place]: No  Equipment/Devices: None  Discharge Condition: Stable  CODE STATUS: FULL CODE  Diet recommendation:  Diet Order             Diet - low sodium heart healthy           Diet regular Room service appropriate? Yes; Fluid consistency: Thin  Diet effective now                    Brief Summary: Patient is a 70 y.o. female with history of HLD, GERD-who presented with sudden onset of severe left wrist pain.  Subsequently admitted to the hospitalist service for further evaluation and treatment.    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  Septic arthritis less likely.  Brief Hospital Course: Left wrist pain/swelling: Suspected related to inflammatory etiology-septic arthritis felt to be less likely.  Briefly placed on IV vancomycin-also placed on naproxen.  Hand surgery/plastics follow closely-significant improvement overnight-recommendations from Dr. Silverio Lay place-is to continue with naproxen-does not require antimicrobial therapy on discharge.  If similar symptoms recur in the future, probably will need rheumatology involvement.   Hypokalemia/hypomagnesemia: Repleted.   HLD: Continue statin  GERD: Continue PPI   Procedures   Discharge Diagnoses:  Principal Problem:   Acute pain of left wrist Active  Problems:   Mixed hyperlipidemia   GERD without esophagitis   Hypokalemia   SIRS (systemic inflammatory response syndrome) ([place])   Discharge Instructions:  Activity:  As tolerated   Discharge Instructions     Call MD for:  redness, tenderness, or signs of infection (pain, swelling, redness, odor or green/yellow discharge around incision site)   Complete by: As directed    Diet - low sodium heart healthy   Complete by: As directed    Discharge instructions   Complete by: As directed    Follow with Primary MD  Biagio Borg, MD in 1-2 weeks  Please get a complete blood count and chemistry panel checked by your Primary MD at your next visit, and again as instructed by your Primary MD.  Get Medicines reviewed and adjusted: Please take all your medications with you for your next visit with your Primary MD  Laboratory/radiological data: Please request your Primary MD to go over all hospital tests and procedure/radiological results at the follow up, please ask your Primary MD to get all Hospital records sent to his/her office.  In some cases, they will be blood work, cultures and biopsy results pending at the time of your discharge. Please request that your primary care M.D. follows up on these results.  Also Note the following: If you experience worsening of your admission symptoms, develop shortness of breath, life threatening emergency, suicidal or homicidal thoughts you must seek medical attention immediately by calling 911 or calling your MD immediately  if symptoms less severe.  You must read complete instructions/literature along with all the possible adverse reactions/side effects for all the Medicines  you take and that have been prescribed to you. Take any new Medicines after you have completely understood and accpet all the possible adverse reactions/side effects.   Do not drive when taking Pain medications or sleeping medications (Benzodaizepines)  Do not take more than  prescribed Pain, Sleep and Anxiety Medications. It is not advisable to combine anxiety,sleep and pain medications without talking with your primary care practitioner  Special Instructions: If you have smoked or chewed Tobacco  in the last 2 yrs please stop smoking, stop any regular Alcohol  and or any Recreational drug use.  Wear Seat belts while driving.  Please note: You were cared for by a hospitalist during your hospital stay. Once you are discharged, your primary care physician will handle any further medical issues. Please note that NO REFILLS for any discharge medications will be authorized once you are discharged, as it is imperative that you return to your primary care physician (or establish a relationship with a primary care physician if you do not have one) for your post hospital discharge needs so that they can reassess your need for medications and monitor your lab values.   Increase activity slowly   Complete by: As directed       Allergies as of 07/19/2021       Reactions   Doxycycline Rash        Medication List     STOP taking these medications    ibuprofen 600 MG tablet Commonly known as: ADVIL   pantoprazole 40 MG tablet Commonly known as: PROTONIX       TAKE these medications    acetaminophen 325 MG tablet Commonly known as: TYLENOL Take 650 mg by mouth every 6 (six) hours as needed for mild pain.   diclofenac Sodium 1 % Gel Commonly known as: Voltaren Apply 2 g topically 4 (four) times daily.   lidocaine-prilocaine cream Commonly known as: EMLA Apply 1 application topically as needed. Apply to the elbow and cover with saran wrap 1 hour prior to the shot   LORazepam 0.5 MG tablet Commonly known as: ATIVAN 1-2 tabs 30 - 60 min prior to shot. Do not drive with this medicine. What changed:  how much to take how to take this when to take this reasons to take this additional instructions   naproxen 500 MG tablet Commonly known as:  NAPROSYN Take 1 tablet (500 mg total) by mouth 2 (two) times daily with a meal for 7 days.   omeprazole 40 MG capsule Commonly known as: PRILOSEC Take 40 mg by mouth daily.   rosuvastatin 20 MG tablet Commonly known as: Crestor Take 1 tablet (20 mg total) by mouth daily.        Follow-up Information     Biagio Borg, MD. Schedule an appointment as soon as possible for a visit in 1 week(s).   Specialties: Internal Medicine, Radiology Contact information: [place] [address] [phone]         Cindra Presume, MD. Schedule an appointment as soon as possible for a visit in 2 week(s).   Specialty: Plastic Surgery Contact information: [address] [place] [address] [phone]                Allergies  Allergen Reactions   Doxycycline Rash      Consultations: Hand surgery/plastics-Dr. Claudia Desanctis.    Other Procedures/Studies: DG Wrist Complete Left  Result Date: 07/17/2021 CLINICAL DATA:  Left wrist pain.  No known  injury. EXAM: LEFT WRIST - COMPLETE 3+ VIEW COMPARISON:  None. FINDINGS: Degenerative changes of the 1st carpometacarpal joint. No acute bony abnormality. Specifically, no fracture, subluxation, or dislocation. IMPRESSION: No acute bony abnormality. Electronically Signed   By: Rolm Baptise M.D.   On: 07/17/2021 18:36   CT HEAD WO CONTRAST  Result Date: 07/17/2021 CLINICAL DATA:  TIA EXAM: CT HEAD WITHOUT CONTRAST TECHNIQUE: Contiguous axial images were obtained from the base of the skull through the vertex without intravenous contrast. COMPARISON:  04/12/2019 FINDINGS: Brain: No acute intracranial abnormality. Specifically, no hemorrhage, hydrocephalus, mass lesion, acute infarction, or significant intracranial injury. Vascular: No hyperdense vessel or unexpected calcification. Skull: No acute calvarial abnormality. Sinuses/Orbits: Mucosal thickening in the right maxillary sinus. No air-fluid levels. Other: None  IMPRESSION: No acute intracranial abnormality. Electronically Signed   By: Rolm Baptise M.D.   On: 07/17/2021 18:37   MR WRIST LEFT W WO CONTRAST  Result Date: 07/18/2021 CLINICAL DATA:  Acute severe left wrist pain.  No injury. EXAM: MR OF THE LEFT WRIST WITHOUT AND WITH CONTRAST TECHNIQUE: Multiplanar multisequence MR imaging of the left wrist was performed both before and after the administration of intravenous contrast. CONTRAST:  51mL GADAVIST GADOBUTROL 1 MMOL/ML IV SOLN COMPARISON:  Left wrist x-rays from yesterday. FINDINGS: Ligaments: Intact scapholunate and lunotriquetral ligaments. Triangular fibrocartilage: Mild degeneration and ulnar surface fraying of the articular disc. No discrete tear. Tendons: Intact flexor and extensor compartment tendons. Ulnar subluxation of the extensor carpi ulnaris tendon. No tenosynovitis. Carpal tunnel/median nerve: Normal carpal tunnel. Normal median nerve. Guyon's canal: Normal. Joint/cartilage: Small radiocarpal, midcarpal, first CMC, and fourth and fifth CMC joint effusions. 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 Bones/carpal alignment: No acute fracture or dislocation. No bony erosions. No suspicious bone lesion. Other: 0.7 x 1.8 x 1.8 cm multiloculated ganglion cyst volar to the distal radius. Diffuse but dorsal predominant soft tissue swelling. IMPRESSION: 1. Radiocarpal, midcarpal, and first, fourth, and fifth CMC joint effusions. 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 Septic arthritis is less likely. 2. No osteomyelitis, abscess, or tenosynovitis. 3. 1.8 cm multiloculated ganglion cyst volar to the distal radius. 4. Ulnar subluxation of the extensor carpi ulnaris tendon. Electronically Signed   By: Titus Dubin M.D.   On: 07/18/2021 11:54   MM 3D SCREEN BREAST BILATERAL  Result Date: 06/26/2021 CLINICAL DATA:  Screening. EXAM: DIGITAL SCREENING BILATERAL MAMMOGRAM WITH TOMOSYNTHESIS AND CAD TECHNIQUE:  Bilateral screening digital craniocaudal and mediolateral oblique mammograms were obtained. Bilateral screening digital breast tomosynthesis was performed. The images were evaluated with computer-aided detection. COMPARISON:  Previous exam(s). ACR Breast Density Category c: The breast tissue is heterogeneously dense, which may obscure small masses. FINDINGS: There are no findings suspicious for malignancy. IMPRESSION: No mammographic evidence of malignancy. A result letter of this screening mammogram will be mailed directly to the patient. RECOMMENDATION: Screening mammogram in one year. (Code:[id number]-01Y) BI-RADS CATEGORY  1: Negative. Electronically Signed   By: Everlean Alstrom M.D.   On: 06/26/2021 09:57     TODAY-DAY OF DISCHARGE:  Subjective:   Amber Hicks today has no headache,no chest abdominal pain,no new weakness tingling or numbness, feels much better wants to go home today.   Objective:   Blood pressure 108/68, pulse 64, temperature 98 F (36.7 C), resp. rate 19, height 5\' 5"  (1.651 m), weight 72.5 kg, SpO2 100 %.  Intake/Output Summary (Last 24 hours) at 07/19/2021 1127 Last data filed at 07/18/2021 1615 Gross per 24 hour  Intake 906.67 ml  Output --  Net 906.67 ml   Filed Weights   07/18/21 1000 07/18/21 1420  Weight: 72.6 kg 72.5 kg    Exam: Awake Alert, Oriented *3, No new F.N deficits, Normal affect .AT,PERRAL Supple Neck,No JVD, No cervical lymphadenopathy appriciated.  Symmetrical Chest wall movement, Good air movement bilaterally, CTAB RRR,No Gallops,Rubs or new Murmurs, No Parasternal Heave +ve B.Sounds, Abd Soft, Non tender, No organomegaly appriciated, No rebound -guarding or rigidity. No Cyanosis, Clubbing or edema, No new Rash or bruise   PERTINENT RADIOLOGIC STUDIES: DG Wrist Complete Left  Result Date: 07/17/2021 CLINICAL DATA:  Left wrist pain.  No known injury. EXAM: LEFT WRIST - COMPLETE 3+ VIEW COMPARISON:  None. FINDINGS: Degenerative changes  of the 1st carpometacarpal joint. No acute bony abnormality. Specifically, no fracture, subluxation, or dislocation. IMPRESSION: No acute bony abnormality. Electronically Signed   By: Rolm Baptise M.D.   On: 07/17/2021 18:36   CT HEAD WO CONTRAST  Result Date: 07/17/2021 CLINICAL DATA:  TIA EXAM: CT HEAD WITHOUT CONTRAST TECHNIQUE: Contiguous axial images were obtained from the base of the skull through the vertex without intravenous contrast. COMPARISON:  04/12/2019 FINDINGS: Brain: No acute intracranial abnormality. Specifically, no hemorrhage, hydrocephalus, mass lesion, acute infarction, or significant intracranial injury. Vascular: No hyperdense vessel or unexpected calcification. Skull: No acute calvarial abnormality. Sinuses/Orbits: Mucosal thickening in the right maxillary sinus. No air-fluid levels. Other: None IMPRESSION: No acute intracranial abnormality. Electronically Signed   By: Rolm Baptise M.D.   On: 07/17/2021 18:37   MR WRIST LEFT W WO CONTRAST  Result Date: 07/18/2021 CLINICAL DATA:  Acute severe left wrist pain.  No injury. EXAM: MR OF THE LEFT WRIST WITHOUT AND WITH CONTRAST TECHNIQUE: Multiplanar multisequence MR imaging of the left wrist was performed both before and after the administration of intravenous contrast. CONTRAST:  68mL GADAVIST GADOBUTROL 1 MMOL/ML IV SOLN COMPARISON:  Left wrist x-rays from yesterday. FINDINGS: Ligaments: Intact scapholunate and lunotriquetral ligaments. Triangular fibrocartilage: Mild degeneration and ulnar surface fraying of the articular disc. No discrete tear. Tendons: Intact flexor and extensor compartment tendons. Ulnar subluxation of the extensor carpi ulnaris tendon. No tenosynovitis. Carpal tunnel/median nerve: Normal carpal tunnel. Normal median nerve. Guyon's canal: Normal. Joint/cartilage: Small radiocarpal, midcarpal, first CMC, and fourth and fifth CMC joint effusions. 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  Bones/carpal alignment: No acute fracture or dislocation. No bony erosions. No suspicious bone lesion. Other: 0.7 x 1.8 x 1.8 cm multiloculated ganglion cyst volar to the distal radius. Diffuse but dorsal predominant soft tissue swelling. IMPRESSION: 1. Radiocarpal, midcarpal, and first, fourth, and fifth CMC joint effusions. 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 Septic arthritis is less likely. 2. No osteomyelitis, abscess, or tenosynovitis. 3. 1.8 cm multiloculated ganglion cyst volar to the distal radius. 4. Ulnar subluxation of the extensor carpi ulnaris tendon. Electronically Signed   By: Titus Dubin M.D.   On: 07/18/2021 11:54     PERTINENT LAB RESULTS: CBC: Recent Labs    07/18/21 0257 07/19/21 0217  WBC 8.4 6.1  HGB 12.3 12.6  HCT 38.2 38.5  PLT 223 214   CMET CMP     Component Value Date/Time   NA 141 07/19/2021 0217   K 3.9 07/19/2021 0217   CL 111 07/19/2021 0217   CO2 23 07/19/2021 0217   GLUCOSE 101 (H) 07/19/2021 0217   BUN 13 07/19/2021 0217   CREATININE 0.71 07/19/2021 0217   CREATININE 0.88 04/23/2020 1005   CALCIUM 8.7 (L) 07/19/2021 0217  PROT 5.5 (L) 07/19/2021 0217   ALBUMIN 2.9 (L) 07/19/2021 0217   AST 21 07/19/2021 0217   ALT 21 07/19/2021 0217   ALKPHOS 60 07/19/2021 0217   BILITOT 0.5 07/19/2021 0217   GFRNONAA >60 07/19/2021 0217   GFRNONAA 68 04/23/2020 1005   GFRAA 79 04/23/2020 1005    GFR Estimated Creatinine Clearance: 67.2 mL/min (by C-G formula based on SCr of 0.71 mg/dL). No results for input(s): LIPASE, AMYLASE in the last 72 hours. Recent Labs    07/17/21 1656  CKTOTAL 54   Invalid input(s): POCBNP No results for input(s): DDIMER in the last 72 hours. No results for input(s): HGBA1C in the last 72 hours. No results for input(s): CHOL, HDL, LDLCALC, TRIG, CHOLHDL, LDLDIRECT in the last 72 hours. No results for input(s): TSH, T4TOTAL, T3FREE, THYROIDAB in the last 72 hours.  Invalid input(s):  FREET3 No results for input(s): VITAMINB12, FOLATE, FERRITIN, TIBC, IRON, RETICCTPCT in the last 72 hours. Coags: Recent Labs    07/17/21 1656  INR 1.0   Microbiology: Recent Results (from the past 240 hour(s))  Resp Panel by RT-PCR (Flu A&B, Covid) Nasopharyngeal Swab     Status: None   Collection Time: 07/17/21  8:18 PM   Specimen: Nasopharyngeal Swab; Nasopharyngeal(NP) swabs in vial transport medium  Result Value Ref Range Status   SARS Coronavirus 2 by RT PCR NEGATIVE NEGATIVE Final    Comment: (NOTE) SARS-CoV-2 target nucleic acids are NOT DETECTED.  The SARS-CoV-2 RNA is generally detectable in upper respiratory specimens during the acute phase of infection. The lowest concentration of SARS-CoV-2 viral copies this assay can detect is 138 copies/mL. A negative result does not preclude SARS-Cov-2 infection and should not be used as the sole basis for treatment or other patient management decisions. A negative result may occur with  improper specimen collection/handling, submission of specimen other than nasopharyngeal swab, presence of viral mutation(s) within the areas targeted by this assay, and inadequate number of viral copies(<138 copies/mL). A negative result must be combined with clinical observations, patient history, and epidemiological information. The expected result is Negative.  Fact Sheet for Patients:  EntrepreneurPulse.com.au  Fact Sheet for Healthcare Providers:  IncredibleEmployment.be  This test is no t yet approved or cleared by the [place] FDA and  has been authorized for detection and/or diagnosis of SARS-CoV-2 by FDA under an Emergency Use Authorization (EUA). This EUA will remain  in effect (meaning this test can be used) for the duration of the COVID-19 declaration under Section 564(b)(1) of the Act, 21 U.S.C.section 360bbb-3(b)(1), unless the authorization is terminated  or revoked sooner.        Influenza A by PCR NEGATIVE NEGATIVE Final   Influenza B by PCR NEGATIVE NEGATIVE Final    Comment: (NOTE) The Xpert Xpress SARS-CoV-2/FLU/RSV plus assay is intended as an aid in the diagnosis of influenza from Nasopharyngeal swab specimens and should not be used as a sole basis for treatment. Nasal washings and aspirates are unacceptable for Xpert Xpress SARS-CoV-2/FLU/RSV testing.  Fact Sheet for Patients: EntrepreneurPulse.com.au  Fact Sheet for Healthcare Providers: IncredibleEmployment.be  This test is not yet approved or cleared by the [place] FDA and has been authorized for detection and/or diagnosis of SARS-CoV-2 by FDA under an Emergency Use Authorization (EUA). This EUA will remain in effect (meaning this test can be used) for the duration of the COVID-19 declaration under Section 564(b)(1) of the Act, 21 U.S.C. section 360bbb-3(b)(1), unless the authorization is terminated or revoked.  Performed  at [facility], [place] [address], [place], [place] [address]   Culture, blood (single)     Status: None (Preliminary result)   Collection Time: 07/17/21  8:18 PM   Specimen: BLOOD RIGHT ARM  Result Value Ref Range Status   Specimen Description BLOOD RIGHT ARM  Final   Special Requests   Final    BOTTLES DRAWN AEROBIC AND ANAEROBIC Blood Culture results may not be optimal due to an inadequate volume of blood received in culture bottles   Culture   Final    NO GROWTH 2 DAYS Performed at [facility], [place] [address], [place], [place] [address]    Report Status PENDING  Incomplete    FURTHER DISCHARGE INSTRUCTIONS:  Get Medicines reviewed and adjusted: Please take all your medications with you for your next visit with your Primary MD  Laboratory/radiological data: Please request your Primary MD to go over all hospital tests and procedure/radiological results at the follow up, please ask your Primary MD to get all Hospital  records sent to his/her office.  In some cases, they will be blood work, cultures and biopsy results pending at the time of your discharge. Please request that your primary care M.D. goes through all the records of your hospital data and follows up on these results.  Also Note the following: If you experience worsening of your admission symptoms, develop shortness of breath, life threatening emergency, suicidal or homicidal thoughts you must seek medical attention immediately by calling 911 or calling your MD immediately  if symptoms less severe.  You must read complete instructions/literature along with all the possible adverse reactions/side effects for all the Medicines you take and that have been prescribed to you. Take any new Medicines after you have completely understood and accpet all the possible adverse reactions/side effects.   Do not drive when taking Pain medications or sleeping medications (Benzodaizepines)  Do not take more than prescribed Pain, Sleep and Anxiety Medications. It is not advisable to combine anxiety,sleep and pain medications without talking with your primary care practitioner  Special Instructions: If you have smoked or chewed Tobacco  in the last 2 yrs please stop smoking, stop any regular Alcohol  and or any Recreational drug use.  Wear Seat belts while driving.  Please note: You were cared for by a hospitalist during your hospital stay. Once you are discharged, your primary care physician will handle any further medical issues. Please note that NO REFILLS for any discharge medications will be authorized once you are discharged, as it is imperative that you return to your primary care physician (or establish a relationship with a primary care physician if you do not have one) for your post hospital discharge needs so that they can reassess your need for medications and monitor your lab values.  Total Time spent coordinating discharge including counseling,  education and face to face time equals 35 minutes.  SignedOren Binet 07/19/2021 11:27 AM

## 2021-07-19 NOTE — TOC Initial Note (Signed)
Transition of Care [facility]) - Initial/Assessment Note    Patient Details  Name: Amber Hicks MRN: [id number] Date of Birth: [birth year]  Transition of Care [facility] CM/SW Contact:    Maebelle Munroe, RN Phone Number: 07/19/2021, 10:53 AM  Clinical Narrative:  [facility] team for discharge planning. Spoke to pt via phone. She shares that he wrist has improved. The swelling and pain are reduced and she voices having more mobility of her fingers. Pt shares that she lives alone. Her spouse recently died in 07-Apr-2021. She voices she doe not drive. Her neighbor takes her to appointments andf shopping. She has a son that lives 5 minutes away that she can call for needs as well. She shares she will call family member for ride home upon discharge. Will continue the monitor for any discharge needs.                 Expected Discharge Plan: Home/Self Care Barriers to Discharge: No Barriers Identified   Patient Goals and CMS Choice Patient states their goals for this hospitalization and ongoing recovery are:: Pt wants to return home. States she lives alone.      Expected Discharge Plan and Services Expected Discharge Plan: Home/Self Care       Living arrangements for the past 2 months: Single Family Home                 DME Arranged:  (None.)         HH Arranged:  (Not ordered.)          Prior Living Arrangements/Services Living arrangements for the past 2 months: Single Family Home Lives with:: Self (She shares spouse died in 2021-04-07. Son lives 5 minutes away. She has assistance of neighbor/friend.) Patient language and need for interpreter reviewed:: No Do you feel safe going back to the place where you live?: Yes      Need for Family Participation in Patient Care: No (Comment) Care giver support system in place?: Yes (comment) (Son- lives 5 minutes away and neighbor/friend. She uses friend for transportation to doctor appts and shopping.) Current home services:   (None.) Criminal Activity/Legal Involvement Pertinent to Current Situation/Hospitalization: No - Comment as needed  Activities of Daily Living Home Assistive Devices/Equipment: Cane (specify quad or straight) ADL Screening (condition at time of admission) Patient's cognitive ability adequate to safely complete daily activities?: Yes Is the patient deaf or have difficulty hearing?: No Does the patient have difficulty seeing, even when wearing glasses/contacts?: No Does the patient have difficulty concentrating, remembering, or making decisions?: No Patient able to express need for assistance with ADLs?: No Does the patient have difficulty dressing or bathing?: No Independently performs ADLs?: Yes (appropriate for developmental age) Does the patient have difficulty walking or climbing stairs?: No Weakness of Legs: None Weakness of Arms/Hands: None  Permission Sought/Granted                  Emotional Assessment   Attitude/Demeanor/Rapport:  (N/A- telephone conversation.) Affect (typically observed): Accepting, Pleasant, Happy Orientation: : Oriented to Self, Oriented to Place, Oriented to  Time, Oriented to Situation   Psych Involvement: No (comment)  Admission diagnosis:  Left wrist pain [M25.532] Acute pain of left wrist [M25.532] Patient Active Problem List   Diagnosis Date Noted   Acute pain of left wrist 07/17/2021   Hypokalemia 07/17/2021   SIRS (systemic inflammatory response syndrome) ([place]) 07/17/2021   Preop exam for internal medicine 01/15/2021   Osteoporosis 12/10/2020  Aortic atherosclerosis ([place]) 12/10/2020   GERD without esophagitis 12/10/2020   Right otitis externa 04/19/2020   Rash 04/19/2020   External hemorrhoids without complication 37/48/2707   Left lateral epicondylitis 12/23/2019   Medial epicondylitis of elbow, left 12/23/2019   Abdominal pain 08/10/2018   Chronic pain of right ankle 06/16/2018   Hx of adenomatous colonic polyps 01/26/2015    Headaches due to old head injury 09/04/2014   Vertigo 08/17/2014   Hidradenitis 04/05/2013   Mixed hyperlipidemia 01/24/2012   Depression 01/22/2012   Encounter for preventative adult health care exam with abnormal findings 01/15/2012   Vitamin D deficiency 07/09/2010   PCP:  Biagio Borg, MD Pharmacy:   [facility], [address] [place] [address] Phone: [phone] Fax: [phone]     Social Determinants of Health (SDOH) Interventions    Readmission Risk Interventions No flowsheet data found.

## 2021-07-19 NOTE — Plan of Care (Signed)

## 2021-07-21 DIAGNOSIS — M810 Age-related osteoporosis without current pathological fracture: Secondary | ICD-10-CM | POA: Diagnosis not present

## 2021-07-21 DIAGNOSIS — M25571 Pain in right ankle and joints of right foot: Secondary | ICD-10-CM | POA: Diagnosis not present

## 2021-07-21 DIAGNOSIS — R42 Dizziness and giddiness: Secondary | ICD-10-CM | POA: Diagnosis not present

## 2021-07-21 DIAGNOSIS — M7062 Trochanteric bursitis, left hip: Secondary | ICD-10-CM | POA: Diagnosis not present

## 2021-07-21 DIAGNOSIS — G8929 Other chronic pain: Secondary | ICD-10-CM | POA: Diagnosis not present

## 2021-07-21 DIAGNOSIS — S76312D Strain of muscle, fascia and tendon of the posterior muscle group at thigh level, left thigh, subsequent encounter: Secondary | ICD-10-CM | POA: Diagnosis not present

## 2021-07-22 LAB — CULTURE, BLOOD (SINGLE): Culture: NO GROWTH

## 2021-07-23 DIAGNOSIS — M25532 Pain in left wrist: Secondary | ICD-10-CM | POA: Diagnosis not present

## 2021-07-23 DIAGNOSIS — E876 Hypokalemia: Secondary | ICD-10-CM | POA: Diagnosis not present

## 2021-07-24 ENCOUNTER — Encounter: Payer: Self-pay | Admitting: Family Medicine

## 2021-07-24 DIAGNOSIS — S76312D Strain of muscle, fascia and tendon of the posterior muscle group at thigh level, left thigh, subsequent encounter: Secondary | ICD-10-CM | POA: Diagnosis not present

## 2021-07-24 DIAGNOSIS — M25571 Pain in right ankle and joints of right foot: Secondary | ICD-10-CM | POA: Diagnosis not present

## 2021-07-24 DIAGNOSIS — G8929 Other chronic pain: Secondary | ICD-10-CM | POA: Diagnosis not present

## 2021-07-24 DIAGNOSIS — M7062 Trochanteric bursitis, left hip: Secondary | ICD-10-CM | POA: Diagnosis not present

## 2021-07-24 DIAGNOSIS — R42 Dizziness and giddiness: Secondary | ICD-10-CM | POA: Diagnosis not present

## 2021-07-24 DIAGNOSIS — M810 Age-related osteoporosis without current pathological fracture: Secondary | ICD-10-CM | POA: Diagnosis not present

## 2021-07-25 NOTE — Telephone Encounter (Signed)
Demographics have been updated.

## 2021-07-30 DIAGNOSIS — R42 Dizziness and giddiness: Secondary | ICD-10-CM | POA: Diagnosis not present

## 2021-07-30 DIAGNOSIS — M810 Age-related osteoporosis without current pathological fracture: Secondary | ICD-10-CM | POA: Diagnosis not present

## 2021-07-30 DIAGNOSIS — S76312D Strain of muscle, fascia and tendon of the posterior muscle group at thigh level, left thigh, subsequent encounter: Secondary | ICD-10-CM | POA: Diagnosis not present

## 2021-07-30 DIAGNOSIS — M25571 Pain in right ankle and joints of right foot: Secondary | ICD-10-CM | POA: Diagnosis not present

## 2021-07-30 DIAGNOSIS — G8929 Other chronic pain: Secondary | ICD-10-CM | POA: Diagnosis not present

## 2021-07-30 DIAGNOSIS — M7062 Trochanteric bursitis, left hip: Secondary | ICD-10-CM | POA: Diagnosis not present

## 2021-08-04 NOTE — Progress Notes (Signed)
I, Wendy Poet, LAT, ATC, am serving as scribe for Dr. Lynne Leader.  Amber Hicks is a 70 y.o. female who presents to [place] [facility] today for L wrist and L great toenail pain.  She was last seen by Dr. Georgina Snell on 07/01/21 for con't R ankle pain and L lateral hip pain and was advised to reach out to [facility] regarding home health PT.  She was also provided a phone number to reach out regarding her [employer] bus application status.  Today, pt would like to discuss her L wrist and L great toe.  Her L wrist has been bothering her for approximately 2 weeks after being in the hospital for a suspected spider bite per pt.  She locates her pain to her L dorsal wrist and dorsal hand.  Her L wrist is swollen. L wrist AROM is painful in all directions.  Pt is RHD.  She has been wearing a wrist brace and using some type of topical gel/cream.  L great toe: Pain x couple weeks.  She locates her pain to her L great toe nailbed.  She does not recall any specific MOI.  Aggravating factors include pressure to the area or wearing any type of footwear.  Diagnostic imaging: L wrist MRI- 07/18/21; L wrist XR- 07/17/21; R ankle MRI- 10/20/20; R ankle XR- 10/04/20, 07/16/20, 06/25/20, 04/12/19 and 12/19/18  Pertinent review of systems: No fevers or chills  Relevant historical information: Patient will be moving into assisted living facility December.   Exam:  BP 110/78 (BP Location: Right Arm, Patient Position: Sitting, Cuff Size: Normal)   Pulse 72   Ht 5\' 5"  (1.651 m)   Wt 163 lb 12.8 oz (74.3 kg)   SpO2 98%   BMI 27.26 kg/m  General: Well Developed, well nourished, and in no acute distress.   MSK:  Left wrist moderate effusion.  Normal-appearing otherwise.  Decreased motion.  Tender palpation dorsal wrist.  Pulses cap refill and sensation are intact distally.  Left great toe nail is thickened with dark pigment mildly tender palpation.    Lab and Radiology Results   EXAM: MR OF  THE LEFT WRIST WITHOUT AND WITH CONTRAST   TECHNIQUE: Multiplanar multisequence MR imaging of the left wrist was performed both before and after the administration of intravenous contrast.   CONTRAST:  17mL GADAVIST GADOBUTROL 1 MMOL/ML IV SOLN   COMPARISON:  Left wrist x-rays from yesterday.   FINDINGS: Ligaments: Intact scapholunate and lunotriquetral ligaments.   Triangular fibrocartilage: Mild degeneration and ulnar surface fraying of the articular disc. No discrete tear.   Tendons: Intact flexor and extensor compartment tendons. Ulnar subluxation of the extensor carpi ulnaris tendon. No tenosynovitis.   Carpal tunnel/median nerve: Normal carpal tunnel. Normal median nerve.   Guyon's canal: Normal.   Joint/cartilage: Small radiocarpal, midcarpal, first CMC, and fourth and fifth CMC joint effusions. 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   Bones/carpal alignment: No acute fracture or dislocation. No bony erosions. No suspicious bone lesion.   Other: 0.7 x 1.8 x 1.8 cm multiloculated ganglion cyst volar to the distal radius. Diffuse but dorsal predominant soft tissue swelling.   IMPRESSION: 1. Radiocarpal, midcarpal, and first, fourth, and fifth CMC joint effusions. 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 Septic arthritis is less likely. 2. No osteomyelitis, abscess, or tenosynovitis. 3. 1.8 cm multiloculated ganglion cyst volar to the distal radius. 4. Ulnar subluxation of the extensor carpi ulnaris tendon.     Electronically  Signed   By: Titus Dubin M.D.   On: 07/18/2021 11:54 I, Lynne Leader, personally (independently) visualized and performed the interpretation of the images attached in this note.  Component     Latest Ref Rng & Units 07/17/2021  Sodium     135 - 145 mmol/L   Potassium     3.5 - 5.1 mmol/L   Chloride     98 - 111 mmol/L   CO2     22 - 32 mmol/L   Glucose     70 - 99 mg/dL   BUN      8 - 23 mg/dL   Creatinine     0.44 - 1.00 mg/dL   Calcium     8.9 - 10.3 mg/dL   Total Protein     6.5 - 8.1 g/dL   Albumin     3.5 - 5.0 g/dL   AST     15 - 41 U/L   ALT     0 - 44 U/L   Alkaline Phosphatase     38 - 126 U/L   Total Bilirubin     0.3 - 1.2 mg/dL   GFR, Estimated     >60 mL/min   Anion gap     5 - 15   WBC     4.0 - 10.5 K/uL   RBC     3.87 - 5.11 MIL/uL   Hemoglobin     12.0 - 15.0 g/dL   HCT     36.0 - 46.0 %   MCV     80.0 - 100.0 fL   MCH     26.0 - 34.0 pg   MCHC     30.0 - 36.0 g/dL   RDW     11.5 - 15.5 %   Platelets     150 - 400 K/uL   nRBC     0.0 - 0.2 %   Specimen Description      BLOOD RIGHT ARM  Special Requests      BOTTLES DRAWN AEROBIC AND ANAEROBIC Blood Culture results may not be optimal due to an inadequate volume of blood received in culture bottles  Culture      NO GROWTH 5 DAYS . . .  Report Status      07/22/2021 FINAL  Sed Rate     0 - 22 mm/hr 16  Uric Acid, Serum     2.5 - 7.1 mg/dL   CRP     <1.0 mg/dL    Component     Latest Ref Rng & Units 07/18/2021 07/19/2021  Sodium     135 - 145 mmol/L  141  Potassium     3.5 - 5.1 mmol/L  3.9  Chloride     98 - 111 mmol/L  111  CO2     22 - 32 mmol/L  23  Glucose     70 - 99 mg/dL  101 (H)  BUN     8 - 23 mg/dL  13  Creatinine     0.44 - 1.00 mg/dL  0.71  Calcium     8.9 - 10.3 mg/dL  8.7 (L)  Total Protein     6.5 - 8.1 g/dL  5.5 (L)  Albumin     3.5 - 5.0 g/dL  2.9 (L)  AST     15 - 41 U/L  21  ALT     0 - 44 U/L  21  Alkaline Phosphatase  38 - 126 U/L  60  Total Bilirubin     0.3 - 1.2 mg/dL  0.5  GFR, Estimated     >60 mL/min  >60  Anion gap     5 - 15  7  WBC     4.0 - 10.5 K/uL  6.1  RBC     3.87 - 5.11 MIL/uL  4.08  Hemoglobin     12.0 - 15.0 g/dL  12.6  HCT     36.0 - 46.0 %  38.5  MCV     80.0 - 100.0 fL  94.4  MCH     26.0 - 34.0 pg  30.9  MCHC     30.0 - 36.0 g/dL  32.7  RDW     11.5 - 15.5 %  13.2  Platelets      150 - 400 K/uL  214  nRBC     0.0 - 0.2 %  0.0  Specimen Description         Special Requests         Culture         Report Status         Sed Rate     0 - 22 mm/hr    Uric Acid, Serum     2.5 - 7.1 mg/dL 5.4   CRP     <1.0 mg/dL  7.1 (H)     Assessment and Plan: 70 y.o. female with left wrist pain.  Patient was hospitalized with a wrist effusion and pain starting on October 20 with concern for septic arthritis.  However after some evaluation and an MRI it was thought that the wrist effusion and pain was not septic arthritis and more of an inflammatory issue.  Upon history today I have learned that Soraya is packing up to move to an assisted living facility and he has been doing a lot of activity without rest.  It could be that she simply overdoing it and work could be that she has an inflammatory issue that were not aware of yet. She also does have a ganglion cyst of the wrist seen on the MRI today. Discussed treatment plan and options.  Plan for limited NSAIDs orally along with topical NSAIDs and referral to home health occupational therapy.  Recheck in 1 month.  Would consider injection at some point however to my prior experience with Hoyle Sauer she is very needle phobic and will struggle significantly with a trial of injection.  As for the toenail issue referral to podiatry.   PDMP not reviewed this encounter. Orders Placed This Encounter  Procedures   [place] LIMITED JOINT SPACE STRUCTURES UP LEFT(NO LINKED CHARGES)    Order Specific Question:   Reason for Exam (SYMPTOM  OR DIAGNOSIS REQUIRED)    Answer:   L wrist pain    Order Specific Question:   Preferred imaging location?    Answer:   [facility]   Ambulatory referral to [facility]    Referral Priority:   Routine    Referral Type:   Home Health Care    Referral Reason:   Specialty Services Required    Requested Specialty:   [place]    Number of Visits Requested:   1   Ambulatory  referral to Podiatry    Referral Priority:   Routine    Referral Type:   Consultation    Referral Reason:   Specialty Services Required    Requested Specialty:   Podiatry  Number of Visits Requested:   1   Meds ordered this encounter  Medications   naproxen (NAPROSYN) 500 MG tablet    Sig: Take 1 tablet (500 mg total) by mouth 2 (two) times daily as needed.    Dispense:  60 tablet    Refill:  2     Discussed warning signs or symptoms. Please see discharge instructions. Patient expresses understanding.   The above documentation has been reviewed and is accurate and complete Lynne Leader, M.D.

## 2021-08-05 ENCOUNTER — Encounter: Payer: Self-pay | Admitting: Family Medicine

## 2021-08-05 ENCOUNTER — Ambulatory Visit: Payer: Self-pay

## 2021-08-05 ENCOUNTER — Other Ambulatory Visit: Payer: Self-pay

## 2021-08-05 ENCOUNTER — Ambulatory Visit (INDEPENDENT_AMBULATORY_CARE_PROVIDER_SITE_OTHER): Payer: Medicare Other | Admitting: Family Medicine

## 2021-08-05 VITALS — BP 110/78 | HR 72 | Ht 65.0 in | Wt 163.8 lb

## 2021-08-05 DIAGNOSIS — M25532 Pain in left wrist: Secondary | ICD-10-CM | POA: Diagnosis not present

## 2021-08-05 DIAGNOSIS — B351 Tinea unguium: Secondary | ICD-10-CM

## 2021-08-05 DIAGNOSIS — M79675 Pain in left toe(s): Secondary | ICD-10-CM | POA: Diagnosis not present

## 2021-08-05 MED ORDER — NAPROXEN 500 MG PO TABS: 500.0000 mg | ORAL_TABLET | Freq: Two times a day (BID) | ORAL | 2 refills | Status: DC | PRN

## 2021-08-05 NOTE — Patient Instructions (Addendum)
Good to see you today.  I've referred you to podiatry for your L great toe/toenail.  I've referred you to home health PT for your L wrist.  Follow-up: one month

## 2021-08-08 ENCOUNTER — Telehealth: Payer: Self-pay | Admitting: Family Medicine

## 2021-08-08 DIAGNOSIS — S76312D Strain of muscle, fascia and tendon of the posterior muscle group at thigh level, left thigh, subsequent encounter: Secondary | ICD-10-CM | POA: Diagnosis not present

## 2021-08-08 DIAGNOSIS — M810 Age-related osteoporosis without current pathological fracture: Secondary | ICD-10-CM | POA: Diagnosis not present

## 2021-08-08 DIAGNOSIS — G8929 Other chronic pain: Secondary | ICD-10-CM | POA: Diagnosis not present

## 2021-08-08 DIAGNOSIS — M25571 Pain in right ankle and joints of right foot: Secondary | ICD-10-CM | POA: Diagnosis not present

## 2021-08-08 DIAGNOSIS — M7062 Trochanteric bursitis, left hip: Secondary | ICD-10-CM | POA: Diagnosis not present

## 2021-08-08 DIAGNOSIS — R42 Dizziness and giddiness: Secondary | ICD-10-CM | POA: Diagnosis not present

## 2021-08-08 NOTE — Telephone Encounter (Signed)
Meredith with [facility] called regarding the current splint the the patient has on her left hand. It is rubbing her thumb pretty bad. Ailene Ravel thinks she may benefit from a thumb spica split. Is that something we can either provide here for order for her?  Please advise.  Ailene Ravel [facility]) can be reached at [phone].

## 2021-08-11 NOTE — Telephone Encounter (Signed)
She should be able to get a thumb spica splint at a medical supply company.  Generally we do not need to order these.  I certainly can write a prescription for one but that usually does not make a difference for the cost.

## 2021-08-12 NOTE — Telephone Encounter (Signed)
Returned call to [name] regarding splint for pt and relayed Dr. Clovis Riley message.  Amber Hicks states that she will inform the pt.

## 2021-08-13 DIAGNOSIS — R42 Dizziness and giddiness: Secondary | ICD-10-CM | POA: Diagnosis not present

## 2021-08-13 DIAGNOSIS — M810 Age-related osteoporosis without current pathological fracture: Secondary | ICD-10-CM | POA: Diagnosis not present

## 2021-08-13 DIAGNOSIS — M7062 Trochanteric bursitis, left hip: Secondary | ICD-10-CM | POA: Diagnosis not present

## 2021-08-13 DIAGNOSIS — M25571 Pain in right ankle and joints of right foot: Secondary | ICD-10-CM | POA: Diagnosis not present

## 2021-08-13 DIAGNOSIS — S76312D Strain of muscle, fascia and tendon of the posterior muscle group at thigh level, left thigh, subsequent encounter: Secondary | ICD-10-CM | POA: Diagnosis not present

## 2021-08-13 DIAGNOSIS — G8929 Other chronic pain: Secondary | ICD-10-CM | POA: Diagnosis not present

## 2021-08-15 DIAGNOSIS — S76312D Strain of muscle, fascia and tendon of the posterior muscle group at thigh level, left thigh, subsequent encounter: Secondary | ICD-10-CM | POA: Diagnosis not present

## 2021-08-15 DIAGNOSIS — M810 Age-related osteoporosis without current pathological fracture: Secondary | ICD-10-CM | POA: Diagnosis not present

## 2021-08-15 DIAGNOSIS — G8929 Other chronic pain: Secondary | ICD-10-CM | POA: Diagnosis not present

## 2021-08-15 DIAGNOSIS — M7062 Trochanteric bursitis, left hip: Secondary | ICD-10-CM | POA: Diagnosis not present

## 2021-08-15 DIAGNOSIS — M25571 Pain in right ankle and joints of right foot: Secondary | ICD-10-CM | POA: Diagnosis not present

## 2021-08-15 DIAGNOSIS — R42 Dizziness and giddiness: Secondary | ICD-10-CM | POA: Diagnosis not present

## 2021-08-19 ENCOUNTER — Encounter: Payer: Self-pay | Admitting: Family Medicine

## 2021-08-19 DIAGNOSIS — H25811 Combined forms of age-related cataract, right eye: Secondary | ICD-10-CM | POA: Diagnosis not present

## 2021-08-19 DIAGNOSIS — H0102A Squamous blepharitis right eye, upper and lower eyelids: Secondary | ICD-10-CM | POA: Diagnosis not present

## 2021-08-19 DIAGNOSIS — H1045 Other chronic allergic conjunctivitis: Secondary | ICD-10-CM | POA: Diagnosis not present

## 2021-08-19 DIAGNOSIS — S76312D Strain of muscle, fascia and tendon of the posterior muscle group at thigh level, left thigh, subsequent encounter: Secondary | ICD-10-CM | POA: Diagnosis not present

## 2021-08-19 DIAGNOSIS — M25571 Pain in right ankle and joints of right foot: Secondary | ICD-10-CM | POA: Diagnosis not present

## 2021-08-19 DIAGNOSIS — R42 Dizziness and giddiness: Secondary | ICD-10-CM | POA: Diagnosis not present

## 2021-08-19 DIAGNOSIS — M810 Age-related osteoporosis without current pathological fracture: Secondary | ICD-10-CM | POA: Diagnosis not present

## 2021-08-19 DIAGNOSIS — H0102B Squamous blepharitis left eye, upper and lower eyelids: Secondary | ICD-10-CM | POA: Diagnosis not present

## 2021-08-19 DIAGNOSIS — H2589 Other age-related cataract: Secondary | ICD-10-CM | POA: Diagnosis not present

## 2021-08-19 DIAGNOSIS — G8929 Other chronic pain: Secondary | ICD-10-CM | POA: Diagnosis not present

## 2021-08-19 DIAGNOSIS — M7062 Trochanteric bursitis, left hip: Secondary | ICD-10-CM | POA: Diagnosis not present

## 2021-08-27 DIAGNOSIS — G8929 Other chronic pain: Secondary | ICD-10-CM | POA: Diagnosis not present

## 2021-08-27 DIAGNOSIS — M810 Age-related osteoporosis without current pathological fracture: Secondary | ICD-10-CM | POA: Diagnosis not present

## 2021-08-27 DIAGNOSIS — M25571 Pain in right ankle and joints of right foot: Secondary | ICD-10-CM | POA: Diagnosis not present

## 2021-08-27 DIAGNOSIS — S76312D Strain of muscle, fascia and tendon of the posterior muscle group at thigh level, left thigh, subsequent encounter: Secondary | ICD-10-CM | POA: Diagnosis not present

## 2021-08-27 DIAGNOSIS — M7062 Trochanteric bursitis, left hip: Secondary | ICD-10-CM | POA: Diagnosis not present

## 2021-08-27 DIAGNOSIS — R42 Dizziness and giddiness: Secondary | ICD-10-CM | POA: Diagnosis not present

## 2021-08-29 ENCOUNTER — Ambulatory Visit: Payer: Medicare Other | Admitting: Family Medicine

## 2021-08-29 DIAGNOSIS — S76312D Strain of muscle, fascia and tendon of the posterior muscle group at thigh level, left thigh, subsequent encounter: Secondary | ICD-10-CM | POA: Diagnosis not present

## 2021-08-29 DIAGNOSIS — M7062 Trochanteric bursitis, left hip: Secondary | ICD-10-CM | POA: Diagnosis not present

## 2021-08-29 DIAGNOSIS — G44309 Post-traumatic headache, unspecified, not intractable: Secondary | ICD-10-CM | POA: Diagnosis not present

## 2021-08-29 DIAGNOSIS — M25571 Pain in right ankle and joints of right foot: Secondary | ICD-10-CM | POA: Diagnosis not present

## 2021-08-29 DIAGNOSIS — M810 Age-related osteoporosis without current pathological fracture: Secondary | ICD-10-CM | POA: Diagnosis not present

## 2021-08-29 DIAGNOSIS — R42 Dizziness and giddiness: Secondary | ICD-10-CM | POA: Diagnosis not present

## 2021-09-01 DIAGNOSIS — M25571 Pain in right ankle and joints of right foot: Secondary | ICD-10-CM | POA: Diagnosis not present

## 2021-09-01 DIAGNOSIS — M7062 Trochanteric bursitis, left hip: Secondary | ICD-10-CM | POA: Diagnosis not present

## 2021-09-01 DIAGNOSIS — G44309 Post-traumatic headache, unspecified, not intractable: Secondary | ICD-10-CM | POA: Diagnosis not present

## 2021-09-01 DIAGNOSIS — R42 Dizziness and giddiness: Secondary | ICD-10-CM | POA: Diagnosis not present

## 2021-09-01 DIAGNOSIS — M810 Age-related osteoporosis without current pathological fracture: Secondary | ICD-10-CM | POA: Diagnosis not present

## 2021-09-01 DIAGNOSIS — S76312D Strain of muscle, fascia and tendon of the posterior muscle group at thigh level, left thigh, subsequent encounter: Secondary | ICD-10-CM | POA: Diagnosis not present

## 2021-09-02 ENCOUNTER — Ambulatory Visit: Payer: Medicare Other | Admitting: Family Medicine

## 2021-09-05 ENCOUNTER — Encounter: Payer: Self-pay | Admitting: Internal Medicine

## 2021-09-08 ENCOUNTER — Other Ambulatory Visit: Payer: Self-pay

## 2021-09-08 ENCOUNTER — Ambulatory Visit (INDEPENDENT_AMBULATORY_CARE_PROVIDER_SITE_OTHER): Payer: Medicare Other | Admitting: Family Medicine

## 2021-09-08 VITALS — BP 110/78 | HR 74 | Ht 65.0 in | Wt 163.4 lb

## 2021-09-08 DIAGNOSIS — M25532 Pain in left wrist: Secondary | ICD-10-CM | POA: Diagnosis not present

## 2021-09-08 DIAGNOSIS — M79675 Pain in left toe(s): Secondary | ICD-10-CM | POA: Diagnosis not present

## 2021-09-08 DIAGNOSIS — F40298 Other specified phobia: Secondary | ICD-10-CM | POA: Diagnosis not present

## 2021-09-08 DIAGNOSIS — B351 Tinea unguium: Secondary | ICD-10-CM

## 2021-09-08 MED ORDER — LORAZEPAM 0.5 MG PO TABS: ORAL_TABLET | ORAL | 0 refills | Status: AC

## 2021-09-08 NOTE — Progress Notes (Signed)
I, Peterson Lombard, LAT, ATC acting as a scribe for Lynne Leader, MD.  Amber Hicks is a 70 y.o. female who presents to [place] at [facility] today for f/u L wrist pain. Pt was hospitalized with L wrist effusion and pain on 07/17/21 and septic arthritis was r/u. Pt was last seen by Dr. Georgina Snell on 08/05/21 and was referred to home health OT. Today, pt reports she is all moved in to her assisted living community. Pt reports L wrist is feeling very painful. Pt notes she used arm a lot moving. The majority the pain is located at the base of the thumb currently.  Dx imaging: 07/18/21 L wrist MRI  07/17/21 L wrist XR  Pertinent review of systems: No fevers or chills  Relevant historical information: Cognitive dysfunction.   Exam:  BP 110/78   Pulse 74   Ht 5\' 5"  (1.651 m)   Wt 163 lb 6.4 oz (74.1 kg)   SpO2 98%   BMI 27.19 kg/m  General: Well Developed, well nourished, and in no acute distress.   MSK: Left wrist and thumb.  Swelling at the base of the thumb and wrist.  Tender palpation dorsal wrist and base of the thumb.  Decreased motion.    Lab and Radiology Results    EXAM: MR OF THE LEFT WRIST WITHOUT AND WITH CONTRAST   TECHNIQUE: Multiplanar multisequence MR imaging of the left wrist was performed both before and after the administration of intravenous contrast.   CONTRAST:  34mL GADAVIST GADOBUTROL 1 MMOL/ML IV SOLN   COMPARISON:  Left wrist x-rays from yesterday.   FINDINGS: Ligaments: Intact scapholunate and lunotriquetral ligaments.   Triangular fibrocartilage: Mild degeneration and ulnar surface fraying of the articular disc. No discrete tear.   Tendons: Intact flexor and extensor compartment tendons. Ulnar subluxation of the extensor carpi ulnaris tendon. No tenosynovitis.   Carpal tunnel/median nerve: Normal carpal tunnel. Normal median nerve.   Guyon's canal: Normal.   Joint/cartilage: Small radiocarpal, midcarpal, first CMC, and  fourth and fifth CMC joint effusions. 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   Bones/carpal alignment: No acute fracture or dislocation. No bony erosions. No suspicious bone lesion.   Other: 0.7 x 1.8 x 1.8 cm multiloculated ganglion cyst volar to the distal radius. Diffuse but dorsal predominant soft tissue swelling.   IMPRESSION: 1. Radiocarpal, midcarpal, and first, fourth, and fifth CMC joint effusions. 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 Septic arthritis is less likely. 2. No osteomyelitis, abscess, or tenosynovitis. 3. 1.8 cm multiloculated ganglion cyst volar to the distal radius. 4. Ulnar subluxation of the extensor carpi ulnaris tendon.     Electronically Signed   By: Titus Dubin M.D.   On: 07/18/2021 11:54 I, Lynne Leader, personally (independently) visualized and performed the interpretation of the images attached in this note.     Assessment and Plan: 70 y.o. female with left wrist and thumb pain.  Majority of pain is located the base of the thumb.  She has not doing as well as I would hope with conservative management including trials of hand therapy at her assisted living facility.  Additionally she is using a thumb spica wrist brace and Voltaren gel.  Neck step at this point should be injection.  Based on my prior experience with Hoyle Sauer she has not done well with injections in the past.  She has significant needle phobia.  I have prescribed Ativan that she should take about an hour prior to her  return visit for injection.  Likely will inject the base of the thumb.  Additionally repeat referral to podiatry for her left great toenail.  This was a referral placed last month that she never was able to follow-up with.  Provided her with a phone number to call.   PDMP not reviewed this encounter. Orders Placed This Encounter  Procedures   Ambulatory referral to Podiatry    Referral Priority:   Routine     Referral Type:   Consultation    Referral Reason:   Specialty Services Required    Requested Specialty:   Podiatry    Number of Visits Requested:   1   Meds ordered this encounter  Medications   LORazepam (ATIVAN) 0.5 MG tablet    Sig: 1-2 tabs 30 - 60 min prior to shot. Do not drive with this medicine.    Dispense:  4 tablet    Refill:  0     Discussed warning signs or symptoms. Please see discharge instructions. Patient expresses understanding.   The above documentation has been reviewed and is accurate and complete Lynne Leader, M.D.  Total encounter time 30 minutes including face-to-face time with the patient and, reviewing past medical record, and charting on the date of service.   Treatment plan and options

## 2021-09-08 NOTE — Patient Instructions (Addendum)
Thank you for coming in today.   Call the podiatrist to set up the visit for your toe.  Phone: [phone]  Take the Ativan 1 hour prior to the injection.  Schedule the injection at your convenience.

## 2021-09-10 ENCOUNTER — Other Ambulatory Visit: Payer: Self-pay

## 2021-09-10 ENCOUNTER — Ambulatory Visit (INDEPENDENT_AMBULATORY_CARE_PROVIDER_SITE_OTHER): Payer: Medicare Other | Admitting: Family Medicine

## 2021-09-10 ENCOUNTER — Encounter: Payer: Self-pay | Admitting: Family Medicine

## 2021-09-10 ENCOUNTER — Ambulatory Visit: Payer: Self-pay

## 2021-09-10 DIAGNOSIS — M25532 Pain in left wrist: Secondary | ICD-10-CM

## 2021-09-10 NOTE — Progress Notes (Signed)
Note duplication 

## 2021-09-10 NOTE — Progress Notes (Signed)
Amber Hicks was scheduled today for a injection to her left wrist or base of her thumb.  However given her significant needle phobia I had prescribed lorazepam for her to take at a time.  However she did not pick the medication up and has not taken her anxiety medication.  After discussion we decided to delay the injection until she can get her medicine from the pharmacy and take it.  No charge for today's visit.

## 2021-09-10 NOTE — Patient Instructions (Signed)
Thank you for coming in today.   Go pick up the Ativan at your pharmacy.  Take the Ativan 1 hour prior to your visit with me.  Reschedule your visit to a day and time when you can take the medication prior to the injection.

## 2021-09-11 ENCOUNTER — Ambulatory Visit: Payer: Self-pay

## 2021-09-11 ENCOUNTER — Ambulatory Visit (INDEPENDENT_AMBULATORY_CARE_PROVIDER_SITE_OTHER): Payer: Medicare Other | Admitting: Family Medicine

## 2021-09-11 DIAGNOSIS — G8929 Other chronic pain: Secondary | ICD-10-CM

## 2021-09-11 DIAGNOSIS — M79645 Pain in left finger(s): Secondary | ICD-10-CM | POA: Diagnosis not present

## 2021-09-11 DIAGNOSIS — M25532 Pain in left wrist: Secondary | ICD-10-CM | POA: Diagnosis not present

## 2021-09-11 NOTE — Progress Notes (Signed)
Amber Hicks presents to clinic today for previously arranged injection left hand first [facility]  Due to severe needle phobia lorazepam was prescribed ahead of time.  She took the lorazepam this morning prior to arrival and feels less anxious.  Procedure: Real-time Ultrasound Guided Injection of left first [facility] Device: Philips Affiniti 50G Images permanently stored and available for review in PACS Verbal informed consent obtained.  Discussed risks and benefits of procedure. Warned about infection bleeding damage to structures skin hypopigmentation and fat atrophy among others. Patient expresses understanding and agreement Time-out conducted.   Noted no overlying erythema, induration, or other signs of local infection.   Skin prepped in a sterile fashion.   Local anesthesia: Topical Ethyl chloride.   We had an initial attempt but just as the needle touched her finger she withdrew and we reset and after discussion decided to proceed with a second attempt at injection. Skin was again sterilized with isopropyl alcohol and cold spray was applied. With sterile technique and under real time ultrasound guidance: 20 mg of triamcinolone (0.5 mL of 40 mg/mL solution) and 0.5 mL of lidocaine injected into first [place]. Fluid seen entering the joint capsule.   Completed without difficulty after second attempt. Pain immediately resolved suggesting accurate placement of the medication.   Advised to call if fevers/chills, erythema, induration, drainage, or persistent bleeding.   Images permanently stored and available for review in the ultrasound unit.  Impression: Technically successful ultrasound guided injection.  Recheck as needed.

## 2021-09-11 NOTE — Patient Instructions (Signed)
Thank you for coming in today.   You received an injection today. Seek immediate medical attention if the joint becomes red, extremely painful, or is oozing fluid.   Recheck back as needed

## 2021-09-12 DIAGNOSIS — M7062 Trochanteric bursitis, left hip: Secondary | ICD-10-CM | POA: Diagnosis not present

## 2021-09-12 DIAGNOSIS — G44309 Post-traumatic headache, unspecified, not intractable: Secondary | ICD-10-CM | POA: Diagnosis not present

## 2021-09-12 DIAGNOSIS — M810 Age-related osteoporosis without current pathological fracture: Secondary | ICD-10-CM | POA: Diagnosis not present

## 2021-09-12 DIAGNOSIS — S76312D Strain of muscle, fascia and tendon of the posterior muscle group at thigh level, left thigh, subsequent encounter: Secondary | ICD-10-CM | POA: Diagnosis not present

## 2021-09-12 DIAGNOSIS — M25571 Pain in right ankle and joints of right foot: Secondary | ICD-10-CM | POA: Diagnosis not present

## 2021-09-12 DIAGNOSIS — R42 Dizziness and giddiness: Secondary | ICD-10-CM | POA: Diagnosis not present

## 2021-09-16 ENCOUNTER — Telehealth: Payer: Self-pay | Admitting: Family Medicine

## 2021-09-16 NOTE — Telephone Encounter (Signed)
Amber Hicks called, they visited [place] today in her new facility. Amber Hicks states that she really never got PT/OT from [facility], although we cancelled the request for [facility] based on [facility] taking over her care.   Pt and [name] requesting a new referral for PT/OT for pt L wrist and mobility issues faxed to [phone], if you agree.  Please include recent med list and OV notes with referral.

## 2021-09-19 DIAGNOSIS — S76312D Strain of muscle, fascia and tendon of the posterior muscle group at thigh level, left thigh, subsequent encounter: Secondary | ICD-10-CM | POA: Diagnosis not present

## 2021-09-19 DIAGNOSIS — M810 Age-related osteoporosis without current pathological fracture: Secondary | ICD-10-CM | POA: Diagnosis not present

## 2021-09-19 DIAGNOSIS — M7062 Trochanteric bursitis, left hip: Secondary | ICD-10-CM | POA: Diagnosis not present

## 2021-09-19 DIAGNOSIS — G44309 Post-traumatic headache, unspecified, not intractable: Secondary | ICD-10-CM | POA: Diagnosis not present

## 2021-09-19 DIAGNOSIS — M25571 Pain in right ankle and joints of right foot: Secondary | ICD-10-CM | POA: Diagnosis not present

## 2021-09-19 DIAGNOSIS — R42 Dizziness and giddiness: Secondary | ICD-10-CM | POA: Diagnosis not present

## 2021-09-25 DIAGNOSIS — G44309 Post-traumatic headache, unspecified, not intractable: Secondary | ICD-10-CM | POA: Diagnosis not present

## 2021-09-25 DIAGNOSIS — S76312D Strain of muscle, fascia and tendon of the posterior muscle group at thigh level, left thigh, subsequent encounter: Secondary | ICD-10-CM | POA: Diagnosis not present

## 2021-09-25 DIAGNOSIS — M810 Age-related osteoporosis without current pathological fracture: Secondary | ICD-10-CM | POA: Diagnosis not present

## 2021-09-25 DIAGNOSIS — R42 Dizziness and giddiness: Secondary | ICD-10-CM | POA: Diagnosis not present

## 2021-09-25 DIAGNOSIS — M7062 Trochanteric bursitis, left hip: Secondary | ICD-10-CM | POA: Diagnosis not present

## 2021-09-25 DIAGNOSIS — M25571 Pain in right ankle and joints of right foot: Secondary | ICD-10-CM | POA: Diagnosis not present

## 2021-10-03 DIAGNOSIS — Z13820 Encounter for screening for osteoporosis: Secondary | ICD-10-CM | POA: Diagnosis not present

## 2021-10-03 DIAGNOSIS — Z23 Encounter for immunization: Secondary | ICD-10-CM | POA: Diagnosis not present

## 2021-10-03 DIAGNOSIS — E559 Vitamin D deficiency, unspecified: Secondary | ICD-10-CM | POA: Diagnosis not present

## 2021-10-03 DIAGNOSIS — R296 Repeated falls: Secondary | ICD-10-CM | POA: Diagnosis not present

## 2021-10-03 DIAGNOSIS — Z0000 Encounter for general adult medical examination without abnormal findings: Secondary | ICD-10-CM | POA: Diagnosis not present

## 2021-10-03 DIAGNOSIS — B351 Tinea unguium: Secondary | ICD-10-CM | POA: Diagnosis not present

## 2021-10-09 ENCOUNTER — Other Ambulatory Visit: Payer: Self-pay

## 2021-10-09 ENCOUNTER — Emergency Department (HOSPITAL_COMMUNITY): Payer: Medicare Other

## 2021-10-09 ENCOUNTER — Emergency Department (HOSPITAL_COMMUNITY)
Admission: EM | Admit: 2021-10-09 | Discharge: 2021-10-10 | Disposition: A | Discharge status: Home / Self Care | Payer: Medicare Other | Attending: Emergency Medicine | Admitting: Emergency Medicine

## 2021-10-09 DIAGNOSIS — R0602 Shortness of breath: Secondary | ICD-10-CM | POA: Diagnosis not present

## 2021-10-09 DIAGNOSIS — Z743 Need for continuous supervision: Secondary | ICD-10-CM | POA: Diagnosis not present

## 2021-10-09 DIAGNOSIS — R6889 Other general symptoms and signs: Secondary | ICD-10-CM | POA: Diagnosis not present

## 2021-10-09 DIAGNOSIS — R072 Precordial pain: Secondary | ICD-10-CM | POA: Diagnosis not present

## 2021-10-09 DIAGNOSIS — R0789 Other chest pain: Secondary | ICD-10-CM

## 2021-10-09 DIAGNOSIS — R079 Chest pain, unspecified: Secondary | ICD-10-CM | POA: Diagnosis not present

## 2021-10-09 LAB — CBC WITH DIFFERENTIAL/PLATELET
Abs Immature Granulocytes: 0.02 10*3/uL (ref 0.00–0.07)
Basophils Absolute: 0 10*3/uL (ref 0.0–0.1)
Basophils Relative: 1 %
Eosinophils Absolute: 0.2 10*3/uL (ref 0.0–0.5)
Eosinophils Relative: 4 %
HCT: 37 % (ref 36.0–46.0)
Hemoglobin: 11.9 g/dL — ABNORMAL LOW (ref 12.0–15.0)
Immature Granulocytes: 0 %
Lymphocytes Relative: 36 %
Lymphs Abs: 2.4 10*3/uL (ref 0.7–4.0)
MCH: 30.4 pg (ref 26.0–34.0)
MCHC: 32.2 g/dL (ref 30.0–36.0)
MCV: 94.4 fL (ref 80.0–100.0)
Monocytes Absolute: 0.5 10*3/uL (ref 0.1–1.0)
Monocytes Relative: 7 %
Neutro Abs: 3.4 10*3/uL (ref 1.7–7.7)
Neutrophils Relative %: 52 %
Platelets: 257 10*3/uL (ref 150–400)
RBC: 3.92 MIL/uL (ref 3.87–5.11)
RDW: 13.8 % (ref 11.5–15.5)
WBC: 6.5 10*3/uL (ref 4.0–10.5)
nRBC: 0 % (ref 0.0–0.2)

## 2021-10-09 LAB — BASIC METABOLIC PANEL
Anion gap: 10 (ref 5–15)
BUN: 13 mg/dL (ref 8–23)
CO2: 25 mmol/L (ref 22–32)
Calcium: 9.1 mg/dL (ref 8.9–10.3)
Chloride: 108 mmol/L (ref 98–111)
Creatinine, Ser: 0.78 mg/dL (ref 0.44–1.00)
GFR, Estimated: >60 mL/min (ref >60)
Glucose, Bld: 86 mg/dL (ref 70–99)
Potassium: 3.3 mmol/L — ABNORMAL LOW (ref 3.5–5.1)
Sodium: 143 mmol/L (ref 135–145)

## 2021-10-09 LAB — TROPONIN I (HIGH SENSITIVITY)
Troponin I (High Sensitivity): 5 ng/L (ref <18)
Troponin I (High Sensitivity): 8 ng/L (ref <18)

## 2021-10-09 IMAGING — DX DG CHEST 1V PORT
1 series · 1 of 1 positions shown · non-contrast
Comparison: [DATE]

CLINICAL DATA: Chest pain.

EXAM:
PORTABLE CHEST 1 VIEW

[chest]
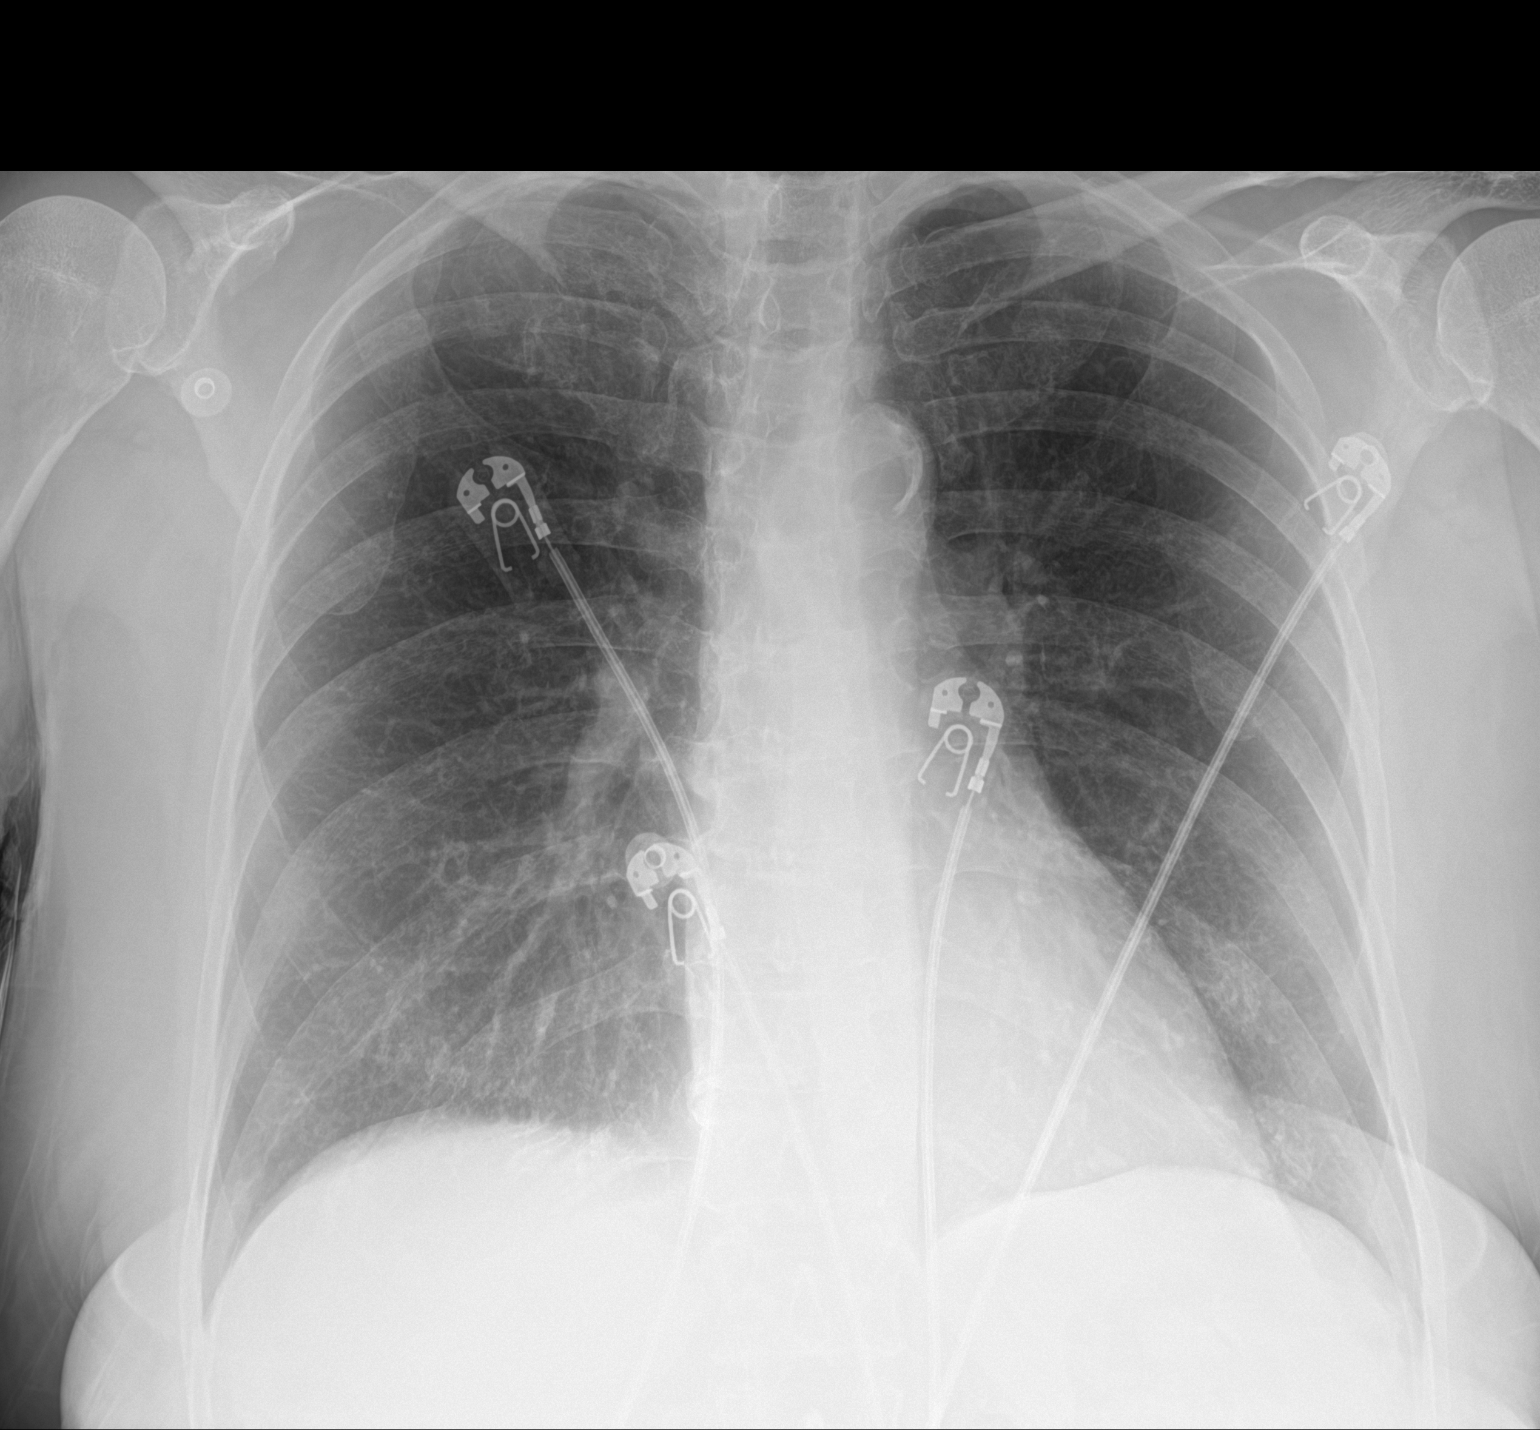

[1 of 1 positions shown; findings below may reference images not displayed]

FINDINGS: Normal sized heart. Clear lungs. Aortic arch calcifications.
Unremarkable bones.
IMPRESSION: No active disease.

## 2021-10-09 MED ORDER — ASPIRIN 81 MG PO CHEW
324.0000 mg | CHEWABLE_TABLET | Freq: Once | ORAL | Status: DC
  Filled 2021-10-09: qty 4

## 2021-10-09 MED ORDER — TRAMADOL HCL 50 MG PO TABS: 50.0000 mg | ORAL_TABLET | Freq: Four times a day (QID) | ORAL | 0 refills | Status: AC | PRN

## 2021-10-09 NOTE — ED Provider Notes (Signed)
[place] EMERGENCY DEPARTMENT Provider Note   CSN: [id number] Arrival date & time: 10/09/21  1604     History  Chief Complaint  Patient presents with   Chest Pain    Amber Hicks is a 71 y.o. female.  Patient brought in by [facility].  Patient has a history of anxiety moved into facility 6 months ago after her husband died.  Concern is for chest pain.  They mention chronic chest pain.  I do not see any previous visits for chest pain.  Patient states that the onset of the chest pain was substernal and it was this morning.  Still present.  Associated with shortness of breath.  States pain is nonradiating.  Not associated with nausea.  Past medical history significant for depression hyperlipidemia gastroesophageal reflux disease and mixed hyperlipidemia.      Home Medications Prior to Admission medications   Medication Sig Start Date End Date Taking? Authorizing Provider  acetaminophen (TYLENOL) 325 MG tablet Take 650 mg by mouth every 6 (six) hours as needed for mild pain.   Yes [provider]  diclofenac Sodium (VOLTAREN) 1 % GEL Apply 2 g topically 4 (four) times daily. Patient taking differently: Apply 2 g topically daily as needed. 07/19/21  Yes Ghimire, Henreitta Leber, MD  LORazepam (ATIVAN) 0.5 MG tablet 1-2 tabs 30 - 60 min prior to shot. Do not drive with this medicine. Patient taking differently: Take 0.5 mg by mouth every 6 (six) hours as needed for anxiety. 09/08/21  Yes Gregor Hams, MD  naproxen (NAPROSYN) 500 MG tablet Take 1 tablet (500 mg total) by mouth 2 (two) times daily as needed. Patient taking differently: Take 500 mg by mouth 2 (two) times daily as needed for moderate pain. 08/05/21  Yes Gregor Hams, MD  omeprazole (PRILOSEC) 40 MG capsule Take 40 mg by mouth daily.   Yes [provider]  rosuvastatin (CRESTOR) 20 MG tablet Take 1 tablet (20 mg total) by mouth daily. 01/15/21  Yes Biagio Borg, MD  traMADol (ULTRAM) 50  MG tablet Take 1 tablet (50 mg total) by mouth every 6 (six) hours as needed for moderate pain. 10/09/21  Yes Fredia Sorrow, MD  lidocaine-prilocaine (EMLA) cream Apply 1 application topically as needed. Apply to the elbow and cover with saran wrap 1 hour prior to the shot Patient not taking: Reported on 07/17/2021 01/23/21   Gregor Hams, MD      Allergies    Doxycycline    Review of Systems   Review of Systems  Constitutional:  Negative for chills and fever.  HENT:  Negative for ear pain and sore throat.   Eyes:  Negative for pain and visual disturbance.  Respiratory:  Negative for cough and shortness of breath.   Cardiovascular:  Positive for chest pain. Negative for palpitations.  Gastrointestinal:  Negative for abdominal pain and vomiting.  Genitourinary:  Negative for dysuria and hematuria.  Musculoskeletal:  Negative for arthralgias and back pain.  Skin:  Negative for color change and rash.  Neurological:  Negative for seizures and syncope.  All other systems reviewed and are negative.  Physical Exam Updated Vital Signs BP 138/64 (BP Location: Right Arm)    Pulse 71    Temp 98.6 F (37 C) (Oral)    Resp 18    Ht 1.702 m (5\' 7" )    Wt 74.8 kg    SpO2 98%    BMI 25.84 kg/m  Physical Exam Vitals  and nursing note reviewed.  Constitutional:      General: She is not in acute distress.    Appearance: Normal appearance. She is well-developed.  HENT:     Head: Normocephalic and atraumatic.  Eyes:     Extraocular Movements: Extraocular movements intact.     Conjunctiva/sclera: Conjunctivae normal.     Pupils: Pupils are equal, round, and reactive to light.  Cardiovascular:     Rate and Rhythm: Normal rate and regular rhythm.     Heart sounds: No murmur heard. Pulmonary:     Effort: Pulmonary effort is normal. No respiratory distress.     Breath sounds: Normal breath sounds.     Comments: Significant tenderness to palpation to the sternal area.  Pain is reproducible. Chest:      Chest wall: Tenderness present.  Abdominal:     Palpations: Abdomen is soft.     Tenderness: There is no abdominal tenderness.  Musculoskeletal:        General: No swelling.     Cervical back: Normal range of motion and neck supple.  Skin:    General: Skin is warm and dry.     Capillary Refill: Capillary refill takes less than 2 seconds.  Neurological:     General: No focal deficit present.     Mental Status: She is alert. Mental status is at baseline.  Psychiatric:        Mood and Affect: Mood normal.    ED Results / Procedures / Treatments   Labs (all labs ordered are listed, but only abnormal results are displayed) Labs Reviewed  CBC WITH DIFFERENTIAL/PLATELET - Abnormal; Notable for the following components:      Result Value   Hemoglobin 11.9 (*)    All other components within normal limits  BASIC METABOLIC PANEL - Abnormal; Notable for the following components:   Potassium 3.3 (*)    All other components within normal limits  TROPONIN I (HIGH SENSITIVITY)  TROPONIN I (HIGH SENSITIVITY)    EKG EKG Interpretation  Date/Time:  Thursday October 09 2021 16:35:24 EST Ventricular Rate:  64 PR Interval:  147 QRS Duration: 88 QT Interval:  404 QTC Calculation: 417 R Axis:   61 Text Interpretation: Sinus rhythm Confirmed by Fredia Sorrow [phone]) on 10/09/2021 4:57:40 PM  Radiology DG Chest Port 1 View  Result Date: 10/09/2021 CLINICAL DATA:  Chest pain. EXAM: PORTABLE CHEST 1 VIEW COMPARISON:  05/22/2020 FINDINGS: Normal sized heart. Clear lungs. Aortic arch calcifications. Unremarkable bones. IMPRESSION: No active disease. Electronically Signed   By: Claudie Revering M.D.   On: 10/09/2021 17:03    Procedures Procedures    Medications Ordered in ED Medications  aspirin chewable tablet 324 mg (324 mg Oral Patient Refused/Not Given 10/09/21 1717)    ED Course/ Medical Decision Making/ A&P                           Medical Decision Making  EKG without any  acute findings.  Labs without any significant abnormalities no leukocytosis.  Hemoglobin down slightly 11.9.  Basic metabolic panel significant for slight hypokalemia with a potassium of 3.3.  Almost normal.  Renal function normal.  First troponin of 5 is normal chest x-ray without any acute findings.  Delta troponin will be important.  Patient's symptoms seem to be chest wall in nature and that the pain is reproducible when you press on the substernal area.  But due to her age and some of  her risk factors will get delta troponin.  Patient's delta troponin was 8.  No significant change.  No evidence of any significant cardiac event.  Symptoms seem to be consistent with chest wall pain.  We will have patient follow-up with cardiology.  But will also treat her with tramadol for the pain.  Final Clinical Impression(s) / ED Diagnoses Final diagnoses:  Chest wall pain  Atypical chest pain    Rx / DC Orders ED Discharge Orders          Ordered    traMADol (ULTRAM) 50 MG tablet  Every 6 hours PRN        10/09/21 2051              Fredia Sorrow, MD 10/09/21 2052

## 2021-10-09 NOTE — ED Notes (Signed)
Called ptar for pt #8 on the list

## 2021-10-09 NOTE — ED Notes (Signed)
Report given to [facility] [place]) on patient's discharge , [place] notified by Network engineer for transport.

## 2021-10-09 NOTE — ED Triage Notes (Signed)
Pt BIB EMS due to chronic chest pain. Pt has a hx of anxiety. Pt moved in facility 6 months ago after husband died. Pt has a mental capacity of a 71 year old per staff. Pt has more chest pain when no one is paying attention to her per EMS and and staff at independent living.

## 2021-10-09 NOTE — Discharge Instructions (Addendum)
He can appointment to follow-up with cardiology.  Also make an appointment to follow-up with your primary care doctor.  Today's work-up without any significant heart findings.  The chest pain does seem to be somewhat chest wall and that hurts to press on the chest.  Take the tramadol as needed for pain.  Return for any new or worse symptoms.

## 2021-10-10 DIAGNOSIS — Z7401 Bed confinement status: Secondary | ICD-10-CM | POA: Diagnosis not present

## 2021-10-10 DIAGNOSIS — M2550 Pain in unspecified joint: Secondary | ICD-10-CM | POA: Diagnosis not present

## 2021-10-17 ENCOUNTER — Other Ambulatory Visit: Payer: Self-pay | Admitting: Family Medicine

## 2021-10-17 DIAGNOSIS — Z13820 Encounter for screening for osteoporosis: Secondary | ICD-10-CM

## 2021-10-23 ENCOUNTER — Encounter: Payer: Self-pay | Admitting: Podiatrist

## 2021-10-23 ENCOUNTER — Ambulatory Visit (INDEPENDENT_AMBULATORY_CARE_PROVIDER_SITE_OTHER): Payer: Medicare Other | Admitting: Podiatrist

## 2021-10-23 ENCOUNTER — Other Ambulatory Visit: Payer: Self-pay

## 2021-10-23 DIAGNOSIS — B351 Tinea unguium: Secondary | ICD-10-CM

## 2021-10-23 DIAGNOSIS — M79609 Pain in unspecified limb: Secondary | ICD-10-CM

## 2021-10-23 DIAGNOSIS — L602 Onychogryphosis: Secondary | ICD-10-CM | POA: Diagnosis not present

## 2021-10-23 NOTE — Patient Instructions (Addendum)
Soak Instructions   Soak in warm water and some soap-  soak twice a day and put some antibiotic ointment on the toe.  It should feel better immediately.  If it still hurts after a couple days, let us  know.

## 2021-10-23 NOTE — Progress Notes (Signed)
Chief Complaint  Patient presents with   Nail Problem    Patient presents today for painful left great toenail, thick, dystrophic.  She says its very painful to wear any shoes and it wakes her up at night   She has only soaked in epson salt which makes it hurt worse     HPI: Patient is 71 y.o. female who presents today for pain in her left great toenail.  She states that it hurts when she tries to extend the toe it also hurts when she has shoes on it and it wakes her up at night.  She states she is tried Epsom salt soaks which makes it hurt worse.  She feels the toenail has become thick and there is a new nail beneath.  Patient Active Problem List   Diagnosis Date Noted   Acute pain of left wrist 07/17/2021   Hypokalemia 07/17/2021   SIRS (systemic inflammatory response syndrome) ([place]) 07/17/2021   Preop exam for internal medicine 01/15/2021   Osteoporosis 12/10/2020   Aortic atherosclerosis ([place]) 12/10/2020   GERD without esophagitis 12/10/2020   Right otitis externa 04/19/2020   Rash 04/19/2020   External hemorrhoids without complication 85/46/2703   Left lateral epicondylitis 12/23/2019   Medial epicondylitis of elbow, left 12/23/2019   Abdominal pain 08/10/2018   Chronic pain of right ankle 06/16/2018   Hx of adenomatous colonic polyps 01/26/2015   Headaches due to old head injury 09/04/2014   Vertigo 08/17/2014   Hidradenitis 04/05/2013   Mixed hyperlipidemia 01/24/2012   Depression 01/22/2012   Encounter for preventative adult health care exam with abnormal findings 01/15/2012   Vitamin D deficiency 07/09/2010    Current Outpatient Medications on File Prior to Visit  Medication Sig Dispense Refill   acetaminophen (TYLENOL) 325 MG tablet Take 650 mg by mouth every 6 (six) hours as needed for mild pain.     diclofenac Sodium (VOLTAREN) 1 % GEL Apply 2 g topically 4 (four) times daily. (Patient taking differently: Apply 2 g topically daily as needed.) 2 g 0    lidocaine-prilocaine (EMLA) cream Apply 1 application topically as needed. Apply to the elbow and cover with saran wrap 1 hour prior to the shot (Patient not taking: Reported on 07/17/2021) 30 g 1   LORazepam (ATIVAN) 0.5 MG tablet 1-2 tabs 30 - 60 min prior to shot. Do not drive with this medicine. (Patient taking differently: Take 0.5 mg by mouth every 6 (six) hours as needed for anxiety.) 4 tablet 0   naproxen (NAPROSYN) 500 MG tablet Take 1 tablet (500 mg total) by mouth 2 (two) times daily as needed. (Patient taking differently: Take 500 mg by mouth 2 (two) times daily as needed for moderate pain.) 60 tablet 2   omeprazole (PRILOSEC) 40 MG capsule Take 40 mg by mouth daily.     rosuvastatin (CRESTOR) 20 MG tablet Take 1 tablet (20 mg total) by mouth daily. 90 tablet 3   traMADol (ULTRAM) 50 MG tablet Take 1 tablet (50 mg total) by mouth every 6 (six) hours as needed for moderate pain. 15 tablet 0   No current facility-administered medications on file prior to visit.    Allergies  Allergen Reactions   Doxycycline Rash    Review of Systems No fevers, chills, nausea, muscle aches, no difficulty breathing, no calf pain, no chest pain or shortness of breath.   Physical Exam  GENERAL APPEARANCE: Alert and conversant she does appear to be very sensitive to the pain on her  toe at today's visit.  VASCULAR: Pedal pulses palpable DP and PT bilateral.  Capillary refill time is immediate to all digits,  Proximal to distal cooling it warm to warm.  Digital perfusion adequate.   NEUROLOGIC: sensation is intact to 5.07 monofilament at 5/5 sites bilateral.  Light touch is intact bilateral, vibratory sensation intact bilateral  MUSCULOSKELETAL: acceptable muscle strength, tone and stability bilateral.  No gross boney pedal deformities noted.  No pain, crepitus or limitation noted with foot and ankle range of motion bilateral.   DERMATOLOGIC: skin is warm, supple, and dry.  No open lesions noted.  No  rash, no pre ulcerative lesions.  Left hallux nail is significantly thickened and does appear to have a rams horn type deformity noted.  No redness, no swelling, no sign of infection, no drainage is noted around the nail or nail fold to suggest an ingrown nail deformity. The nail is thick, disfigured, with subungual debris present.  No acute infection noted. Left second toe is also thick, and elongated, however it is not bothersome for her today.     Assessment     ICD-10-CM   1. Onychogryposis of toenail  L60.2     2. Pain due to onychomycosis of nail  B35.1    M79.609        Plan  Discussed examination findings and treatment options and alternatives with the patient.  At first the patient states she would like the nail to be removed.  I did agree and I prepped the skin with alcohol I infiltrated 0.5% Marcaine plain and 2% lidocaine plain on the lateral aspect of the left great toe she was unable to tolerate the second injection to fully anesthetize the toe.  Therefore I recommended smoothing the toenail down as far as I could get it to remove the bulk from the nail which is likely causing her pain.  This was accomplished today with a power bur without complication.  I discussed with the patient that if this fails to relieve her pain she would need to come back and we would need to remove the nail by anesthetizing it she would also need to soak the toe afterwards and apply bandages.  She would like to call if she would like this treatment in the future. I also recommended routine nail trims to keep the nail from regrowing so thick, she will consider this option and call to set up an appointment.

## 2021-10-25 ENCOUNTER — Other Ambulatory Visit: Payer: Self-pay | Admitting: Podiatry

## 2021-10-25 MED ORDER — CEPHALEXIN 500 MG PO CAPS: 500.0000 mg | ORAL_CAPSULE | Freq: Four times a day (QID) | ORAL | 0 refills | Status: AC

## 2021-10-28 ENCOUNTER — Ambulatory Visit: Payer: Medicare Other | Admitting: Podiatry

## 2021-10-28 ENCOUNTER — Telehealth: Payer: Self-pay | Admitting: *Deleted

## 2021-10-28 NOTE — Telephone Encounter (Signed)
Patient is calling because she is still in miserable pain(left great toe),unable to sleep at night. The antibiotic (cephalexin)called in is causing diarrhea. Has been scheduled for f/u appointment 10/30/21 @2 :45.

## 2021-10-28 NOTE — Telephone Encounter (Signed)
Patient has been notified thru vmessage, no answer.

## 2021-10-30 ENCOUNTER — Encounter: Payer: Self-pay | Admitting: Podiatrist

## 2021-10-30 ENCOUNTER — Ambulatory Visit (INDEPENDENT_AMBULATORY_CARE_PROVIDER_SITE_OTHER): Payer: Medicare Other | Admitting: Podiatrist

## 2021-10-30 ENCOUNTER — Other Ambulatory Visit: Payer: Self-pay

## 2021-10-30 DIAGNOSIS — L03032 Cellulitis of left toe: Secondary | ICD-10-CM

## 2021-10-30 MED ORDER — CEFDINIR 300 MG PO CAPS: 300.0000 mg | ORAL_CAPSULE | Freq: Two times a day (BID) | ORAL | 0 refills | Status: DC

## 2021-10-30 MED ORDER — LIDOCAINE-PRILOCAINE 2.5-2.5 % EX CREA: 1.0000 "application " | TOPICAL_CREAM | CUTANEOUS | 0 refills | Status: DC | PRN

## 2021-10-30 NOTE — Progress Notes (Signed)
Chief Complaint  Patient presents with   Toe Pain    Follow up left hallux - continued pain     HPI: Patient is 71 y.o. female who presents today for continued pain in the left great toe at the level of the toenail and distal joint. She states the pain is so severe she is unable to more her foot.  She relates it keeps her up at night and she is unable to sleep.    Allergies  Allergen Reactions   Doxycycline Rash    Review of systems is negative except as noted in the HPI.  Denies nausea/ vomiting/ fevers/ chills or night sweats.   Denies difficulty breathing, denies calf pain or tenderness  Physical Exam  Patient is awake, alert, and oriented x 3.  In no acute distress.    Vascular status is intact with palpable pedal pulses DP and PT bilateral and capillary refill time less than 3 seconds bilateral.  No edema or erythema noted.   Neurological exam reveals epicritic and protective sensation grossly intact bilateral.   Dermatological exam reveals skin is supple and dry to bilateral feet.  Left hallux nail does not appear overtly infected.  Some mild pink discoloration is present at the proximal nail fold. No streaking or lymphangitis is noted.  No pus or pirulance is palpated or expressed.  Exquisite tenderness to palpation of the left great toe is noted.     Musculoskeletal exam: Musculature intact with dorsiflexion, plantarflexion, inversion, eversion. Ankle and First MPJ joint range of motion normal.     Assessment:   ICD-10-CM   1. Infection of nail bed of toe of left foot  L03.032        Plan: I recommended anesthetizing the toe and removing the toenail however the patient refused.  When I attempted to anesthetize it at the last visit she did not handle it very well.  She states she would like to take antibiotic however her stomach became upset with the last antibiotic.  I did agree to try her on an antibiotic again to see if this will help with the pain she is experiencing.   I called in twice daily onmel for her to try.  I also called in EMLA/ lidocaine cream for her to put on the toe at night to help with the pain.   She will be seen back in a week for recheck- if there is no improvement at that time,  a nail removal would be considered.    She will call if she has any trouble with the antibiotic.

## 2021-10-30 NOTE — Patient Instructions (Signed)
I am calling in a prescription to [facility] for you-- they are located at [address] [place], [place]  Their phone number is [phone]  They offer delivery-  you just have to call to give them your insurance information and to make a payment if needed.    They do close at 6pm so be sure to call as soon as you can.

## 2021-11-14 ENCOUNTER — Ambulatory Visit: Payer: Medicare Other | Admitting: Podiatry

## 2021-11-26 ENCOUNTER — Ambulatory Visit: Payer: Medicare Other | Admitting: Podiatry

## 2021-11-26 DIAGNOSIS — H0102B Squamous blepharitis left eye, upper and lower eyelids: Secondary | ICD-10-CM | POA: Diagnosis not present

## 2021-11-26 DIAGNOSIS — H1045 Other chronic allergic conjunctivitis: Secondary | ICD-10-CM | POA: Diagnosis not present

## 2021-11-26 DIAGNOSIS — H0102A Squamous blepharitis right eye, upper and lower eyelids: Secondary | ICD-10-CM | POA: Diagnosis not present

## 2021-11-26 DIAGNOSIS — H25811 Combined forms of age-related cataract, right eye: Secondary | ICD-10-CM | POA: Diagnosis not present

## 2021-11-26 DIAGNOSIS — H2589 Other age-related cataract: Secondary | ICD-10-CM | POA: Diagnosis not present

## 2021-12-03 ENCOUNTER — Ambulatory Visit (INDEPENDENT_AMBULATORY_CARE_PROVIDER_SITE_OTHER): Payer: Self-pay | Admitting: Podiatry

## 2021-12-03 DIAGNOSIS — Z91199 Patient's noncompliance with other medical treatment and regimen due to unspecified reason: Secondary | ICD-10-CM

## 2021-12-03 NOTE — Progress Notes (Signed)
No show

## 2021-12-30 DIAGNOSIS — H2589 Other age-related cataract: Secondary | ICD-10-CM | POA: Diagnosis not present

## 2021-12-30 DIAGNOSIS — H25812 Combined forms of age-related cataract, left eye: Secondary | ICD-10-CM | POA: Diagnosis not present

## 2022-01-07 ENCOUNTER — Encounter (HOSPITAL_BASED_OUTPATIENT_CLINIC_OR_DEPARTMENT_OTHER): Payer: Self-pay | Admitting: Emergency Medicine

## 2022-01-07 ENCOUNTER — Other Ambulatory Visit: Payer: Self-pay

## 2022-01-07 ENCOUNTER — Emergency Department (HOSPITAL_BASED_OUTPATIENT_CLINIC_OR_DEPARTMENT_OTHER): Payer: Medicare Other

## 2022-01-07 ENCOUNTER — Emergency Department (HOSPITAL_BASED_OUTPATIENT_CLINIC_OR_DEPARTMENT_OTHER)
Admission: EM | Admit: 2022-01-07 | Discharge: 2022-01-07 | Disposition: A | Discharge status: Home / Self Care | Payer: Medicare Other | Attending: Emergency Medicine | Admitting: Emergency Medicine

## 2022-01-07 DIAGNOSIS — S0990XA Unspecified injury of head, initial encounter: Secondary | ICD-10-CM | POA: Diagnosis not present

## 2022-01-07 DIAGNOSIS — W010XXA Fall on same level from slipping, tripping and stumbling without subsequent striking against object, initial encounter: Secondary | ICD-10-CM | POA: Insufficient documentation

## 2022-01-07 DIAGNOSIS — Z0489 Encounter for examination and observation for other specified reasons: Secondary | ICD-10-CM | POA: Diagnosis not present

## 2022-01-07 DIAGNOSIS — W19XXXA Unspecified fall, initial encounter: Secondary | ICD-10-CM

## 2022-01-07 DIAGNOSIS — R6889 Other general symptoms and signs: Secondary | ICD-10-CM | POA: Diagnosis not present

## 2022-01-07 DIAGNOSIS — S199XXA Unspecified injury of neck, initial encounter: Secondary | ICD-10-CM | POA: Diagnosis not present

## 2022-01-07 DIAGNOSIS — Z743 Need for continuous supervision: Secondary | ICD-10-CM | POA: Diagnosis not present

## 2022-01-07 DIAGNOSIS — M542 Cervicalgia: Secondary | ICD-10-CM | POA: Diagnosis not present

## 2022-01-07 IMAGING — CT CT HEAD W/O CM
5 of 8 series · 16 of 47 positions shown, 17 images · non-contrast
Comparison: [DATE]

CLINICAL DATA: Head trauma, minor (Age >= 65y)



[Series 2: head wo · axial · 0.43mm/px · z∈[+1025,+1075]mm · 2 of 32 slices shown, 3 images (1 of 2)]
[im 11/32  brain]
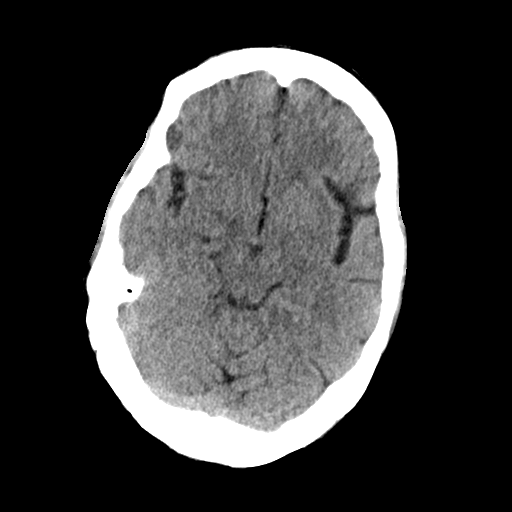
[im 11/32  bone]
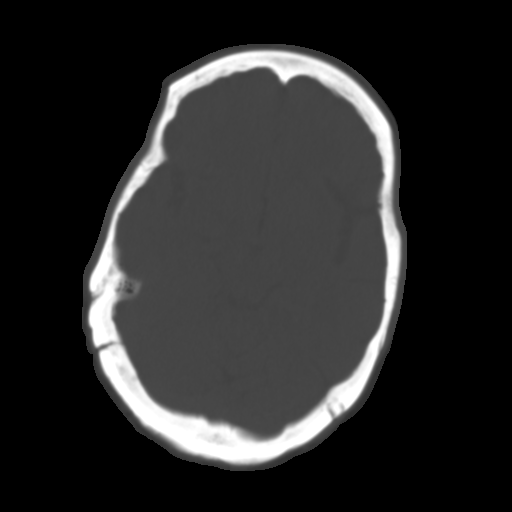
[im 21/32  brain]
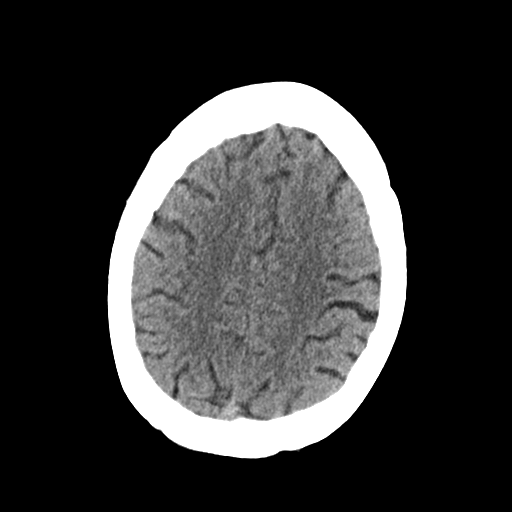

[Series 3: head bone · axial · 0.43mm/px · z∈[+987,+1093]mm · 7 of 80 slices shown]
[im 7/80  bone]
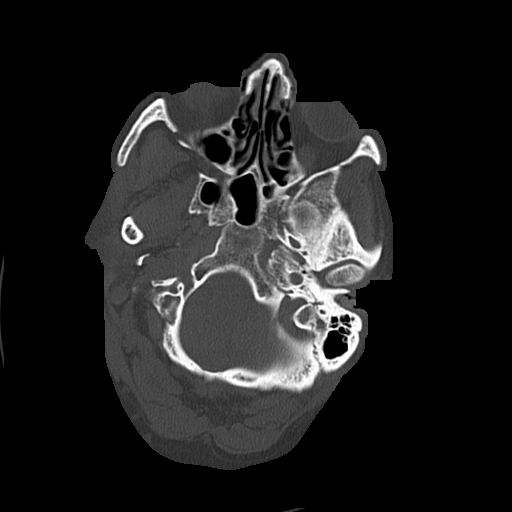
[im 20/80  bone]
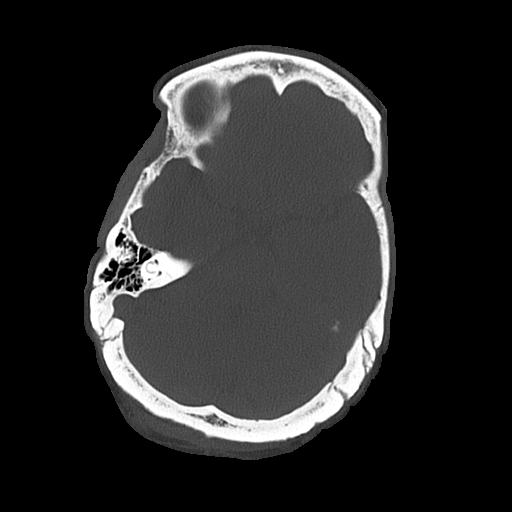
[im 27/80  bone]
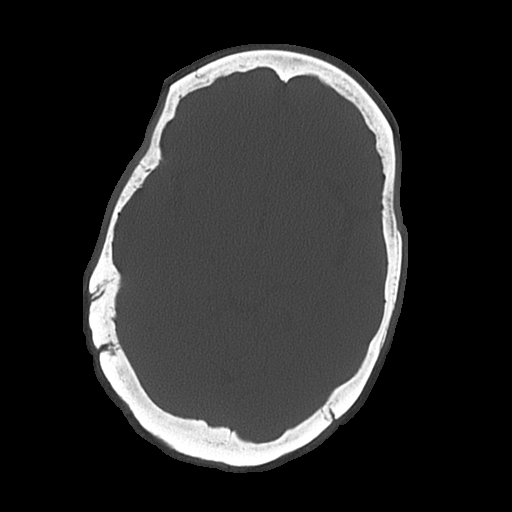
[im 33/80  bone]
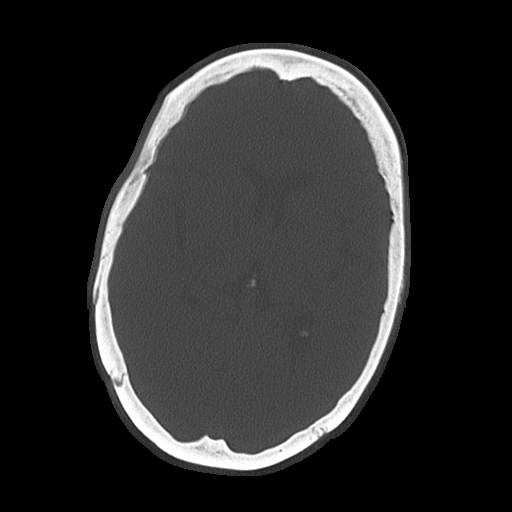
[im 47/80  bone]
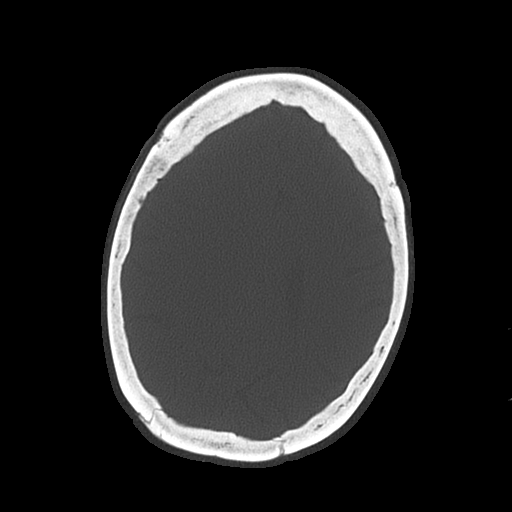
[im 53/80  bone]
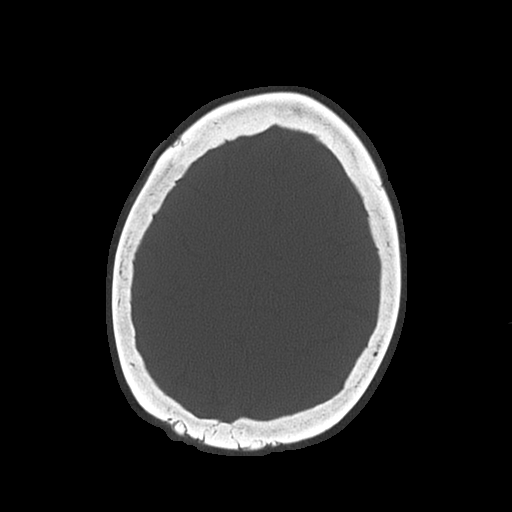
[im 60/80  bone]
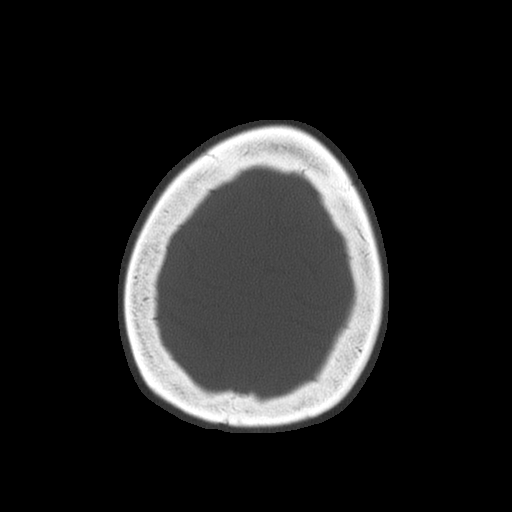

[Series 4: head wo · axial · 0.43mm/px · z∈[+1057,+1092]mm · 2 of 23 slices shown (2 of 2)]
[im 8/23  brain]
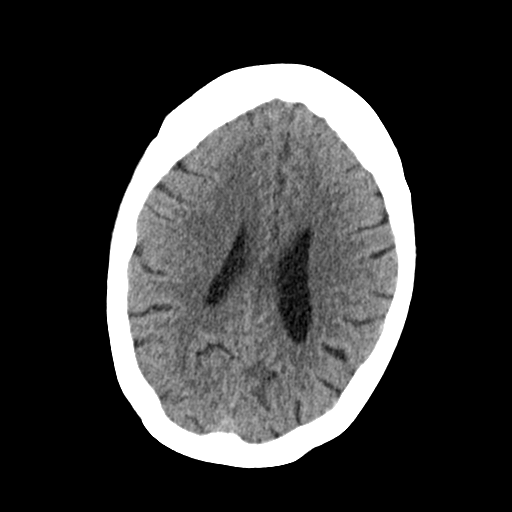
[im 15/23  brain]
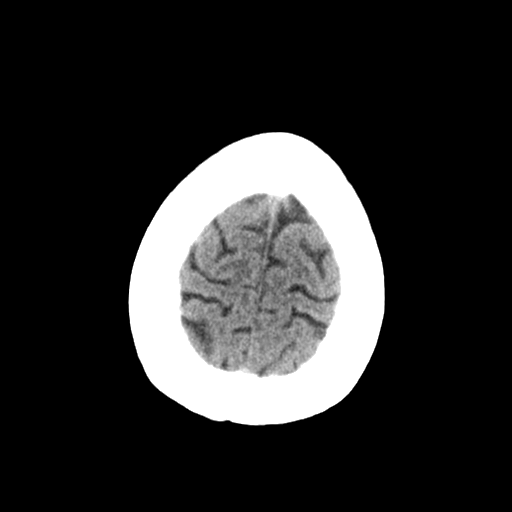

[Series 6: coronal soft · coronal · 0.22mm/px · 3 of 64 slices shown]
[im 16/64  brain]
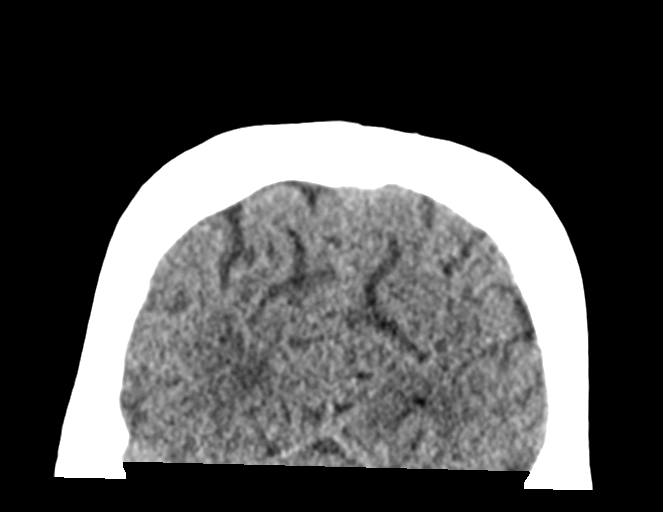
[im 32/64  brain]
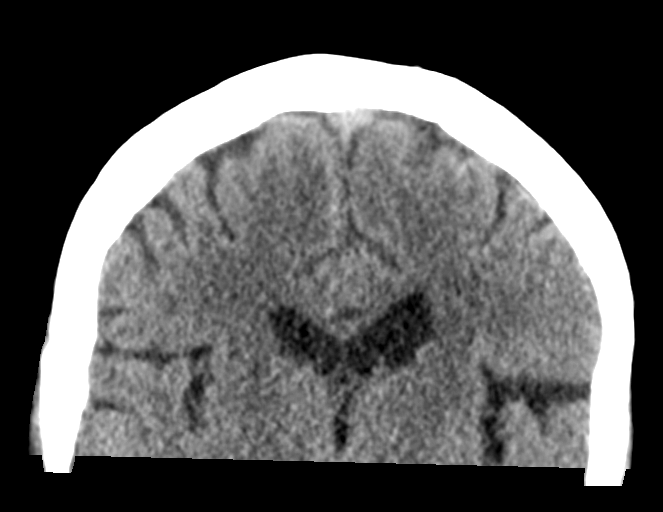
[im 48/64  brain]
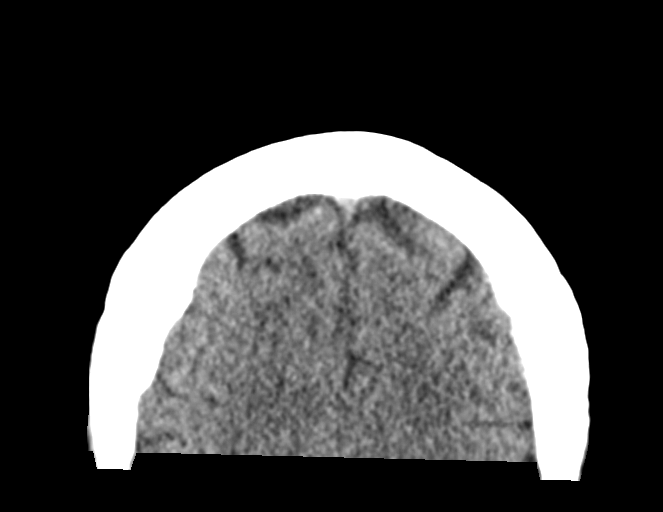

[Series 7: sagittal soft · sagittal · 0.22mm/px · 2 of 50 slices shown]
[im 17/50  brain]
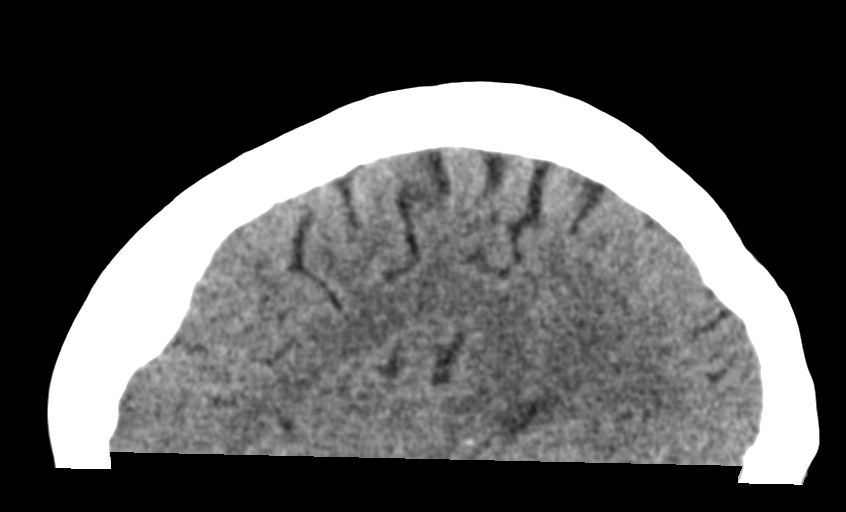
[im 33/50  brain]
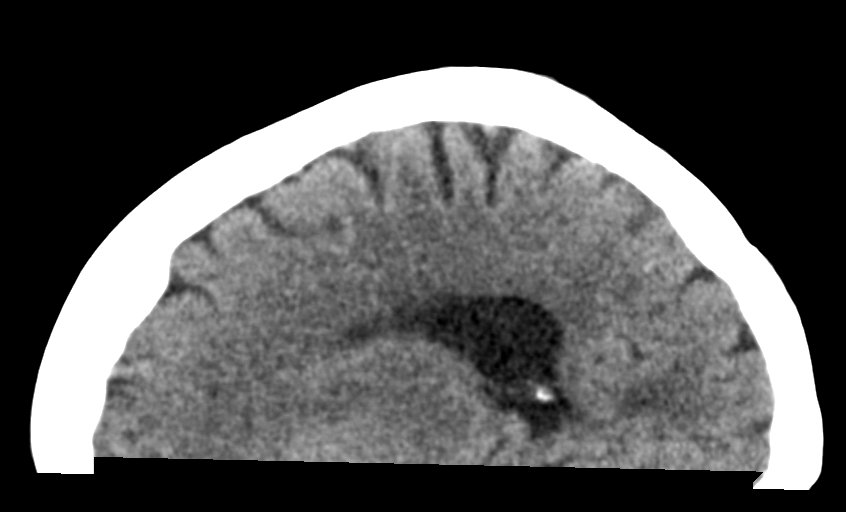

[16 of 47 positions shown; findings below may reference images not displayed]

FINDINGS: Brain: There is no acute intracranial hemorrhage, mass effect, or
edema. Gray-white differentiation is preserved. There is no
extra-axial fluid collection. Ventricles and sulci are within normal
limits in size and configuration.

Vascular: There is atherosclerotic calcification at the skull base.

Skull: Calvarium is unremarkable.

Sinuses/Orbits: No acute finding.

Other: None.
IMPRESSION: No evidence of acute intracranial injury.

## 2022-01-07 IMAGING — CT CT CERVICAL SPINE W/O CM
3 of 4 series · 13 of 33 positions shown, 16 images · non-contrast
Comparison: None.

CLINICAL DATA: Neck trauma (Age >= 65y)



[Series 5: cor bone · coronal · 0.29mm/px · 3 of 78 slices shown]
[im 26/78  bone]
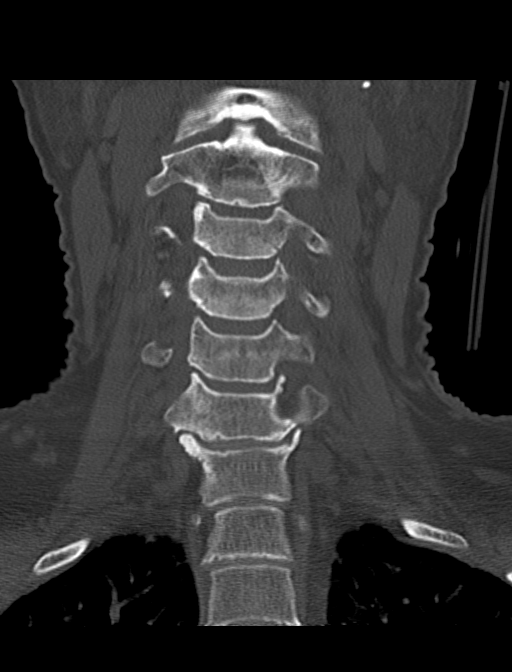
[im 35/78  bone]
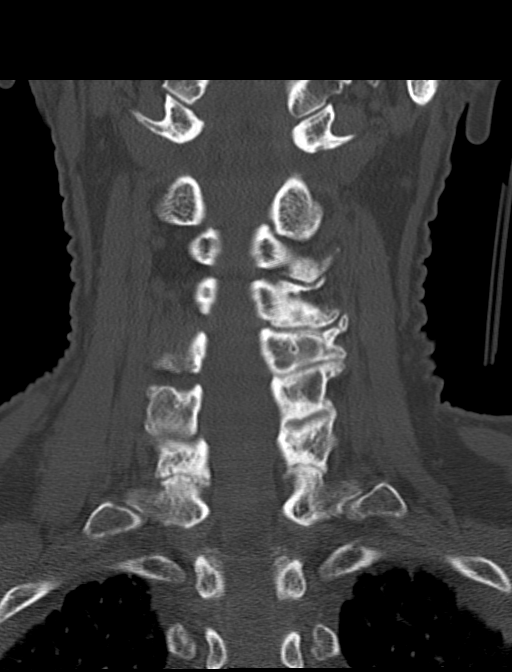
[im 43/78  bone]
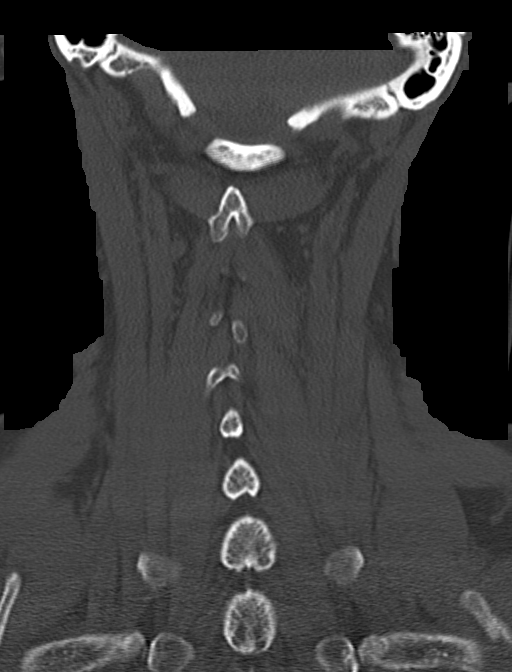

[Series 6: sag bone · sagittal · 0.30mm/px · 5 of 76 slices shown, 6 images]
[im 26/76  bone]
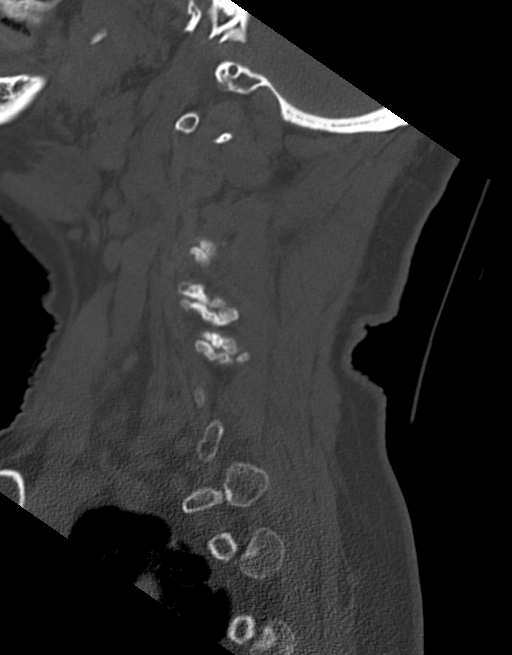
[im 32/76  bone]
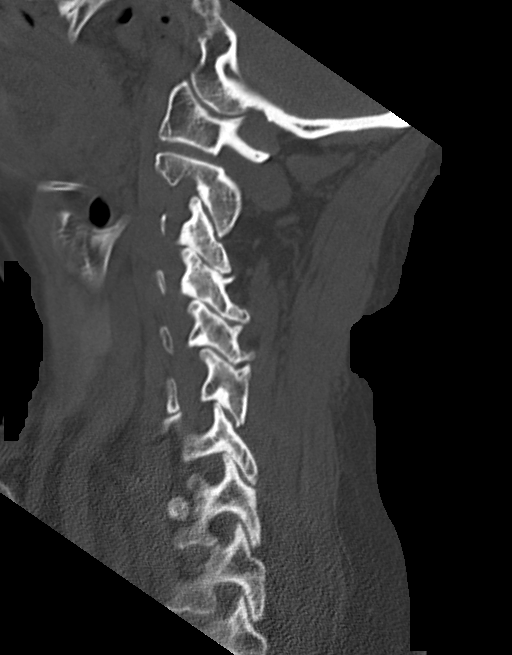
[im 38/76  soft-tissue]
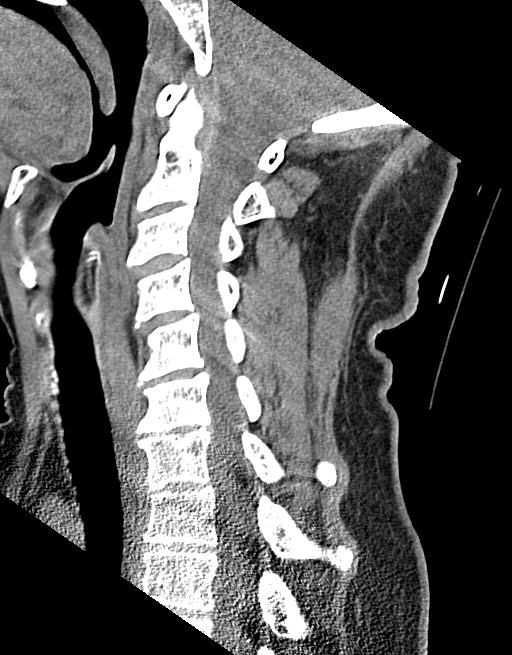
[im 38/76  bone]
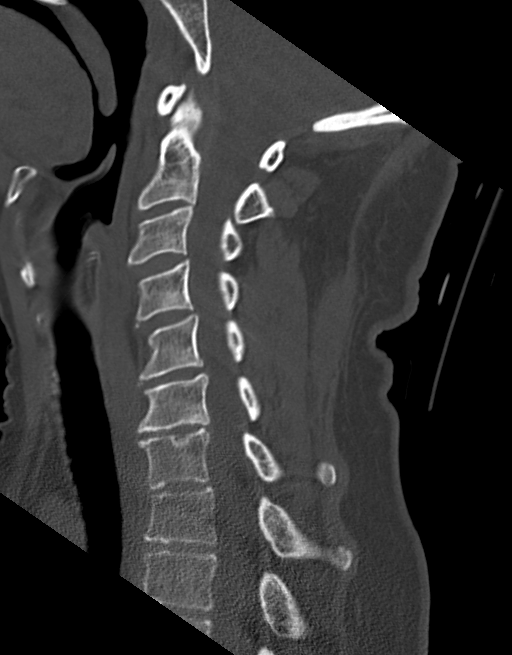
[im 44/76  bone]
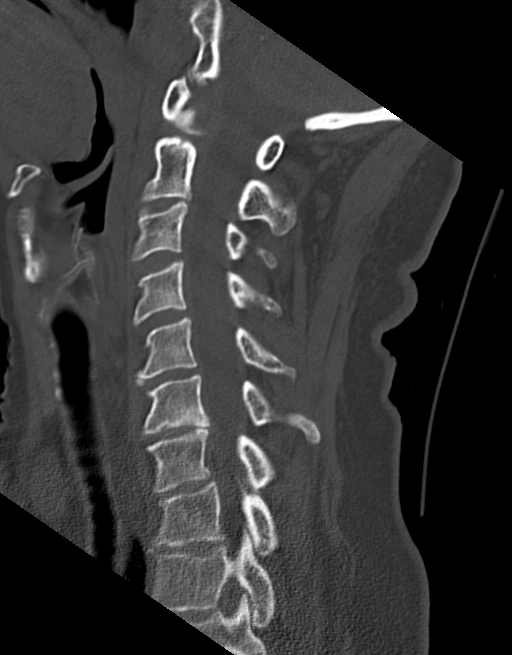
[im 51/76  bone]
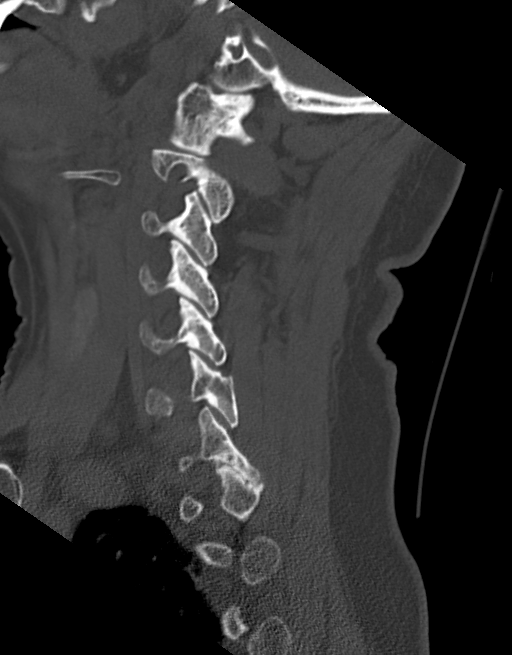

[Series 7: orthogonal axials · axial · 0.23mm/px · z∈[+830,+923]mm · 5 of 90 slices shown, 7 images]
[im 15/90  soft-tissue]
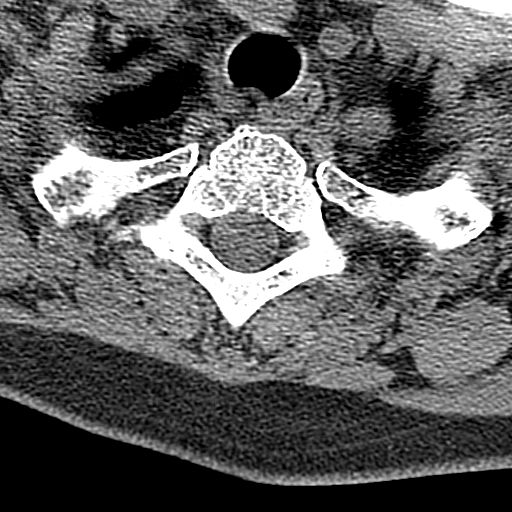
[im 15/90  bone]
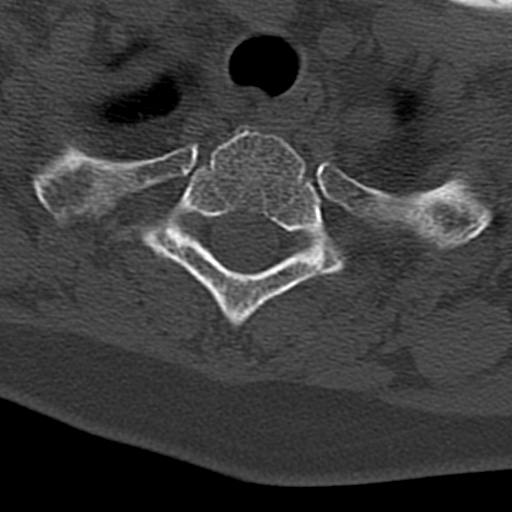
[im 30/90  bone]
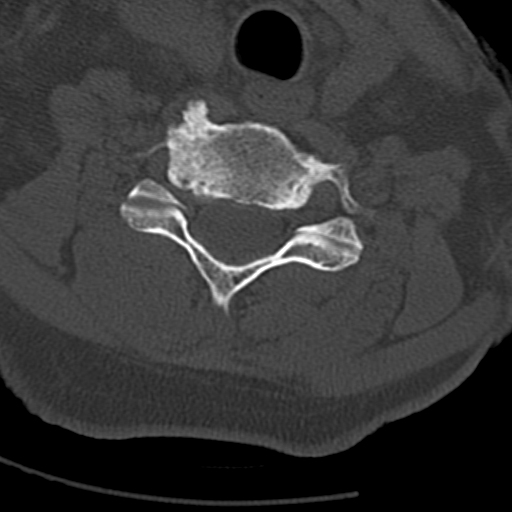
[im 45/90  bone]
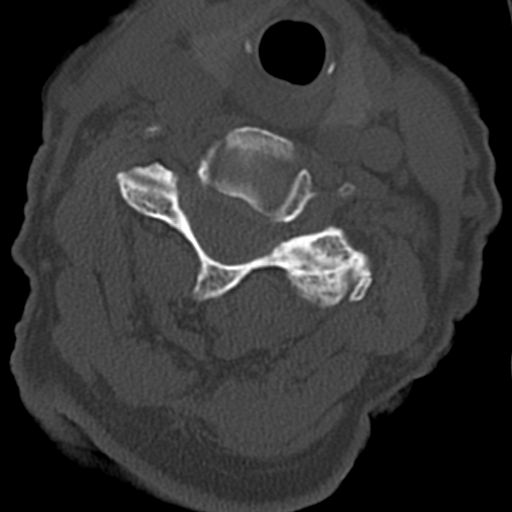
[im 60/90  bone]
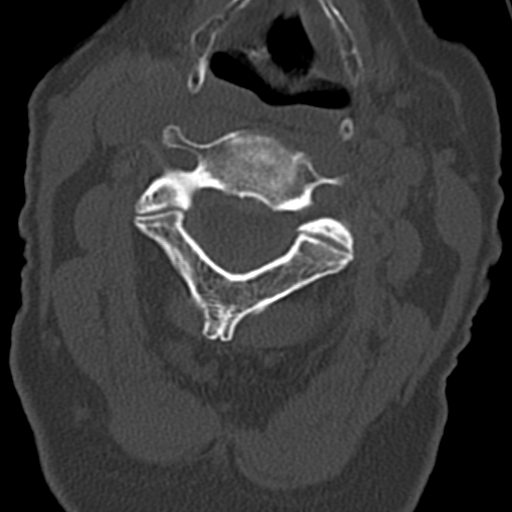
[im 75/90  soft-tissue]
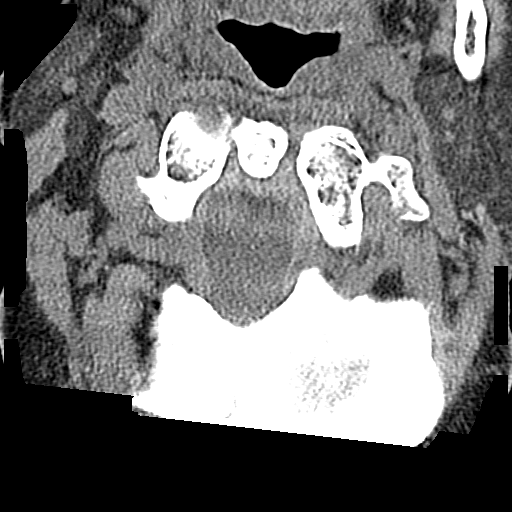
[im 75/90  bone]
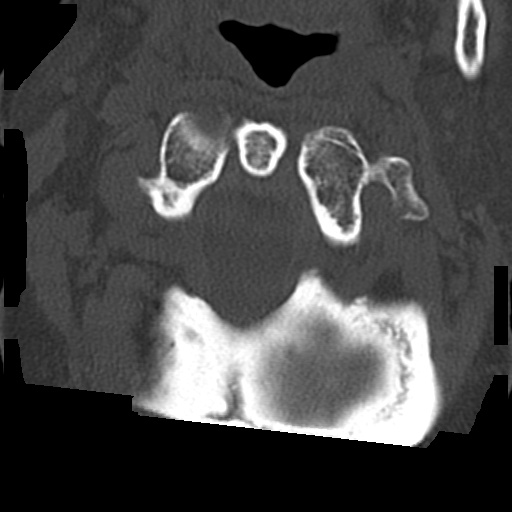

[13 of 33 positions shown; findings below may reference images not displayed]

FINDINGS: Alignment: No significant listhesis.

Skull base and vertebrae: Vertebral body heights are maintained. No
acute fracture. Degenerative endplate irregularity at C6-C7.

Soft tissues and spinal canal: No prevertebral fluid or swelling. No
visible canal hematoma.

Disc levels: Overall mild degenerative changes are present. There is
no high-grade osseous encroachment on the spinal canal. Neural
foraminal narrowing is present primarily on the right at C6-C7.

Upper chest: No apical lung mass.

Other: Trace calcified plaque at the right ICA origin.
IMPRESSION: No acute cervical spine fracture.

## 2022-01-07 MED ORDER — HYDROCODONE-ACETAMINOPHEN 5-325 MG PO TABS
1.0000 | ORAL_TABLET | Freq: Once | ORAL | Status: AC
  Administered 2022-01-07: 1 via ORAL
  Filled 2022-01-07: qty 1

## 2022-01-07 NOTE — ED Notes (Signed)
Pt had a mechanical fall backward and hit the back of her head on her walker. Pt roled onto her right arm. Pt complains of pain to her right arm, neck and head. Pt denis LOC, N/V. Pt is Not on thinners. Pt alert and oriented x 4.  ?

## 2022-01-07 NOTE — Discharge Instructions (Signed)
You were evaluated in the Emergency Department and after careful evaluation, we did not find any emergent condition requiring admission or further testing in the hospital. ? ?Your exam/testing today was overall reassuring.  Your CT imaging was negative for acute intracranial abnormality, fracture or malalignment of the cervical spine, no skull fracture. ? ?Please return to the Emergency Department if you experience any worsening of your condition.  Thank you for allowing [place] to be a part of your care. ? ?

## 2022-01-07 NOTE — ED Provider Notes (Signed)
?[facility] ?Provider Note ? ? ?CSN: [id number] ?Arrival date & time: 01/07/22  1309 ? ?  ? ?History ? ?Chief Complaint  ?Patient presents with  ? Fall  ? ? ?Amber Hicks is a 71 y.o. female. ? ? ?Fall ? ? ? 72 year old female presenting to the ED after a ground level mechanical fall. Non leveled trauma. Normally ambulates with a walker. She tripped and fell backwards, hitting her head on her walker. Landed on the the right side, and her walker fell on her. Denies any pain after the fall. Not on anticoagulation. Arrived GCS15, ABC intact.  ? ?Home Medications ?Prior to Admission medications   ?Medication Sig Start Date End Date Taking? Authorizing Provider  ?acetaminophen (TYLENOL) 325 MG tablet Take 650 mg by mouth every 6 (six) hours as needed for mild pain.    [provider]  ?cefdinir (OMNICEF) 300 MG capsule Take 1 capsule (300 mg total) by mouth 2 (two) times daily. Take this medicine with food. 10/30/21   Bronson Ing, DPM  ?diclofenac Sodium (VOLTAREN) 1 % GEL Apply 2 g topically 4 (four) times daily. ?Patient taking differently: Apply 2 g topically daily as needed. 07/19/21   Ghimire, Henreitta Leber, MD  ?lidocaine-prilocaine (EMLA) cream Apply 1 application topically as needed. Apply to the elbow and cover with saran wrap 1 hour prior to the shot ?Patient not taking: Reported on 07/17/2021 01/23/21   Gregor Hams, MD  ?lidocaine-prilocaine (EMLA) cream Apply 1 application topically as needed. Apply to the right great toe as needed for pain-  use at night to help you sleep 10/30/21   Bronson Ing, DPM  ?LORazepam (ATIVAN) 0.5 MG tablet 1-2 tabs 30 - 60 min prior to shot. Do not drive with this medicine. ?Patient taking differently: Take 0.5 mg by mouth every 6 (six) hours as needed for anxiety. 09/08/21   Gregor Hams, MD  ?naproxen (NAPROSYN) 500 MG tablet Take 1 tablet (500 mg total) by mouth 2 (two) times daily as needed. ?Patient taking differently: Take 500  mg by mouth 2 (two) times daily as needed for moderate pain. 08/05/21   Gregor Hams, MD  ?omeprazole (PRILOSEC) 40 MG capsule Take 40 mg by mouth daily.    [provider]  ?rosuvastatin (CRESTOR) 20 MG tablet Take 1 tablet (20 mg total) by mouth daily. 01/15/21   Biagio Borg, MD  ?traMADol (ULTRAM) 50 MG tablet Take 1 tablet (50 mg total) by mouth every 6 (six) hours as needed for moderate pain. 10/09/21   Fredia Sorrow, MD  ?   ? ?Allergies    ?Doxycycline   ? ?Review of Systems   ?Review of Systems  ?All other systems reviewed and are negative. ? ?Physical Exam ?Updated Vital Signs ?BP (!) 114/93   Pulse 67   Temp 97.8 ?F (36.6 ?C) (Oral)   Resp 18   Ht '5\' 7"'$  (1.702 m)   Wt 63.5 kg   SpO2 98%   BMI 21.93 kg/m?  ?Physical Exam ?Vitals and nursing note reviewed.  ?Constitutional:   ?   General: She is not in acute distress. ?   Appearance: She is well-developed.  ?   Comments: GCS 15, ABC intact  ?HENT:  ?   Head: Normocephalic and atraumatic.  ?Eyes:  ?   Extraocular Movements: Extraocular movements intact.  ?   Conjunctiva/sclera: Conjunctivae normal.  ?   Pupils: Pupils are equal, round, and reactive to light.  ?  Neck:  ?   Comments: C-collar in place, midline cervical tenderness to palpation  ?Cardiovascular:  ?   Rate and Rhythm: Normal rate and regular rhythm.  ?Pulmonary:  ?   Effort: Pulmonary effort is normal. No respiratory distress.  ?   Breath sounds: Normal breath sounds.  ?Chest:  ?   Comments: Clavicles stable nontender to AP compression.  Chest wall stable and nontender to AP and lateral compression. ?Abdominal:  ?   Palpations: Abdomen is soft.  ?   Tenderness: There is no abdominal tenderness.  ?Musculoskeletal:  ?   Cervical back: Neck supple.  ?   Comments: No midline tenderness to palpation of the thoracic or lumbar spine.  Extremities atraumatic with intact range of motion.  Wrist splint in place on the right from prior old injury  ?Skin: ?   General: Skin is warm and dry.   ?Neurological:  ?   Mental Status: She is alert.  ?   Comments: Cranial nerves II through XII grossly intact.  Moving all 4 extremities spontaneously.  Sensation grossly intact all 4 extremities  ? ? ?ED Results / Procedures / Treatments   ?Labs ?(all labs ordered are listed, but only abnormal results are displayed) ?Labs Reviewed - No data to display ? ?EKG ?None ? ?Radiology ?CT Head Wo Contrast ? ?Result Date: 01/07/2022 ?CLINICAL DATA:  Head trauma, minor (Age >= 65y) EXAM: CT HEAD WITHOUT CONTRAST TECHNIQUE: Contiguous axial images were obtained from the base of the skull through the vertex without intravenous contrast. RADIATION DOSE REDUCTION: This exam was performed according to the departmental dose-optimization program which includes automated exposure control, adjustment of the mA and/or kV according to patient size and/or use of iterative reconstruction technique. COMPARISON:  October 2022 FINDINGS: Brain: There is no acute intracranial hemorrhage, mass effect, or edema. Gray-white differentiation is preserved. There is no extra-axial fluid collection. Ventricles and sulci are within normal limits in size and configuration. Vascular: There is atherosclerotic calcification at the skull base. Skull: Calvarium is unremarkable. Sinuses/Orbits: No acute finding. Other: None. IMPRESSION: No evidence of acute intracranial injury. Electronically Signed   By: Macy Mis M.D.   On: 01/07/2022 16:17  ? ?CT Cervical Spine Wo Contrast ? ?Result Date: 01/07/2022 ?CLINICAL DATA:  Neck trauma (Age >= 65y) EXAM: CT CERVICAL SPINE WITHOUT CONTRAST TECHNIQUE: Multidetector CT imaging of the cervical spine was performed without intravenous contrast. Multiplanar CT image reconstructions were also generated. RADIATION DOSE REDUCTION: This exam was performed according to the departmental dose-optimization program which includes automated exposure control, adjustment of the mA and/or kV according to patient size and/or use  of iterative reconstruction technique. COMPARISON:  None. FINDINGS: Alignment: No significant listhesis. Skull base and vertebrae: Vertebral body heights are maintained. No acute fracture. Degenerative endplate irregularity at C6-C7. Soft tissues and spinal canal: No prevertebral fluid or swelling. No visible canal hematoma. Disc levels: Overall mild degenerative changes are present. There is no high-grade osseous encroachment on the spinal canal. Neural foraminal narrowing is present primarily on the right at C6-C7. Upper chest: No apical lung mass. Other: Trace calcified plaque at the right ICA origin. IMPRESSION: No acute cervical spine fracture. Electronically Signed   By: Macy Mis M.D.   On: 01/07/2022 16:21   ? ?Procedures ?Procedures  ? ? ?Medications Ordered in ED ?Medications  ?HYDROcodone-acetaminophen (NORCO/VICODIN) 5-325 MG per tablet 1 tablet (1 tablet Oral Given 01/07/22 1553)  ? ? ?ED Course/ Medical Decision Making/ A&P ?  ?                        ?  Medical Decision Making ?Amount and/or Complexity of Data Reviewed ?Radiology: ordered. ? ?Risk ?Prescription drug management. ? ? ?71 year old female presenting to the ED after a ground level mechanical fall. Non leveled trauma. Normally ambulates with a walker. She tripped and fell backwards, hitting her head on her walker. Landed on the the right side, and her walker fell on her. Denies any pain after the fall. Not on anticoagulation. Arrived GCS15, ABC intact.  ? ?Currently, she is awake, alert, and protecting her own airway and is hemodynamically stable. ? ?Trauma imaging revealed (full reports in EMR): ?CT head and cervical spine without acute intracranial abnormality, no fracture of the cervical spine or malalignment.  No skull fracture. ? ?The patient's cervical collar was cleared in the emergency department.  She is currently ambulatory with no other complaints at this time.  No lacerations or other abnormalities or traumatic injuries noted  on exam.  Stable for discharge ? ? ?Final Clinical Impression(s) / ED Diagnoses ?Final diagnoses:  ?Fall, initial encounter  ? ? ?Rx / DC Orders ?ED Discharge Orders   ? ? None  ? ?  ? ? ?  ?Regan Lemming, M

## 2022-01-07 NOTE — ED Notes (Addendum)
Patient transported to CT 

## 2022-01-22 ENCOUNTER — Encounter (HOSPITAL_BASED_OUTPATIENT_CLINIC_OR_DEPARTMENT_OTHER): Payer: Self-pay

## 2022-01-22 ENCOUNTER — Emergency Department (HOSPITAL_BASED_OUTPATIENT_CLINIC_OR_DEPARTMENT_OTHER)
Admission: EM | Admit: 2022-01-22 | Discharge: 2022-01-23 | Disposition: A | Discharge status: Home / Self Care | Payer: Medicare Other | Attending: Emergency Medicine | Admitting: Emergency Medicine

## 2022-01-22 ENCOUNTER — Ambulatory Visit: Payer: Self-pay

## 2022-01-22 ENCOUNTER — Emergency Department (HOSPITAL_BASED_OUTPATIENT_CLINIC_OR_DEPARTMENT_OTHER): Payer: Medicare Other | Admitting: Radiology

## 2022-01-22 ENCOUNTER — Other Ambulatory Visit: Payer: Self-pay

## 2022-01-22 DIAGNOSIS — S6991XA Unspecified injury of right wrist, hand and finger(s), initial encounter: Secondary | ICD-10-CM | POA: Diagnosis present

## 2022-01-22 DIAGNOSIS — W540XXA Bitten by dog, initial encounter: Secondary | ICD-10-CM | POA: Diagnosis not present

## 2022-01-22 DIAGNOSIS — S61451A Open bite of right hand, initial encounter: Secondary | ICD-10-CM

## 2022-01-22 DIAGNOSIS — W5581XA Bitten by other mammals, initial encounter: Secondary | ICD-10-CM | POA: Diagnosis not present

## 2022-01-22 DIAGNOSIS — Z203 Contact with and (suspected) exposure to rabies: Secondary | ICD-10-CM | POA: Diagnosis not present

## 2022-01-22 DIAGNOSIS — Z743 Need for continuous supervision: Secondary | ICD-10-CM | POA: Diagnosis not present

## 2022-01-22 DIAGNOSIS — Z23 Encounter for immunization: Secondary | ICD-10-CM | POA: Diagnosis not present

## 2022-01-22 DIAGNOSIS — Z2914 Encounter for prophylactic rabies immune globin: Secondary | ICD-10-CM | POA: Insufficient documentation

## 2022-01-22 DIAGNOSIS — R0902 Hypoxemia: Secondary | ICD-10-CM | POA: Diagnosis not present

## 2022-01-22 IMAGING — DX DG HAND COMPLETE 3+V*R*
3 series · 3 of 3 positions shown · non-contrast
Comparison: None.

CLINICAL DATA: Dog bite

EXAM:
RIGHT HAND - COMPLETE 3+ VIEW

[hand ap]
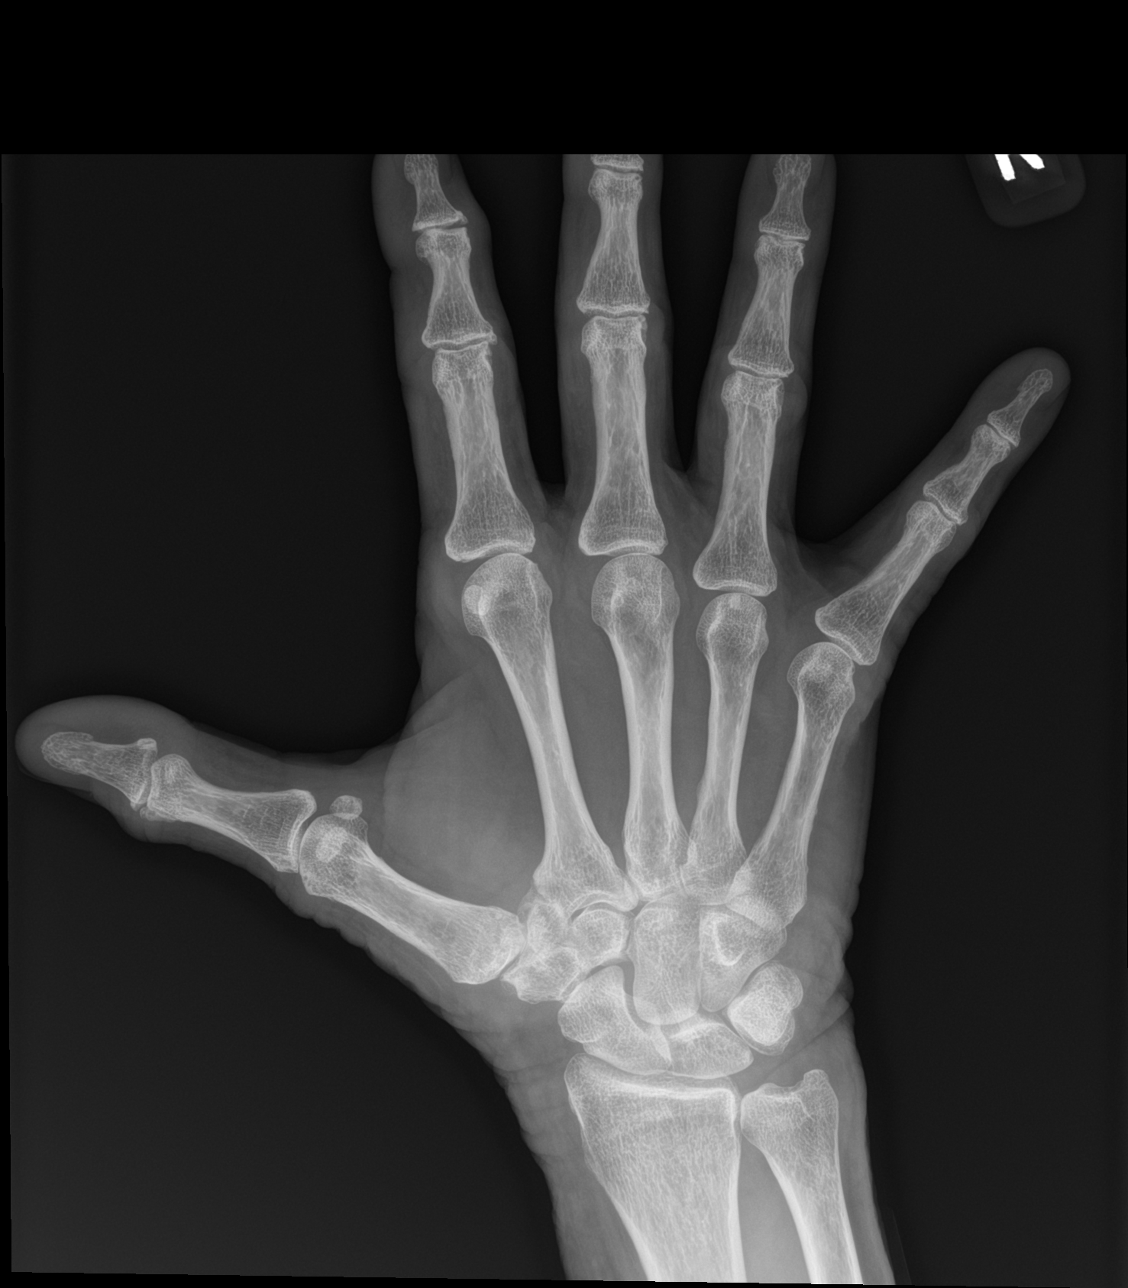

[hand obl]
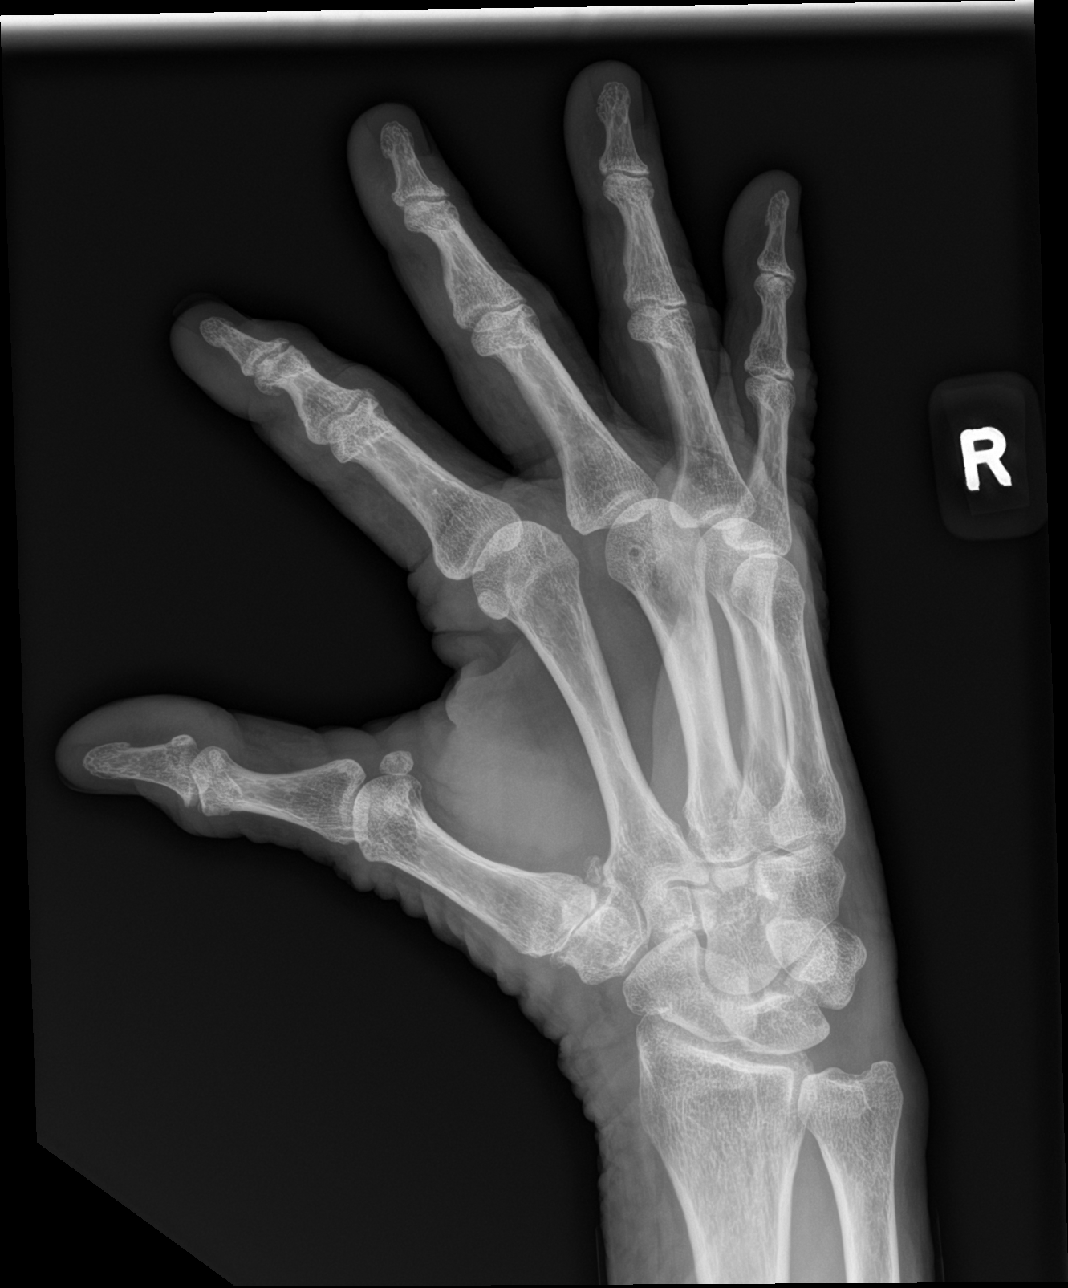

[hand lat]
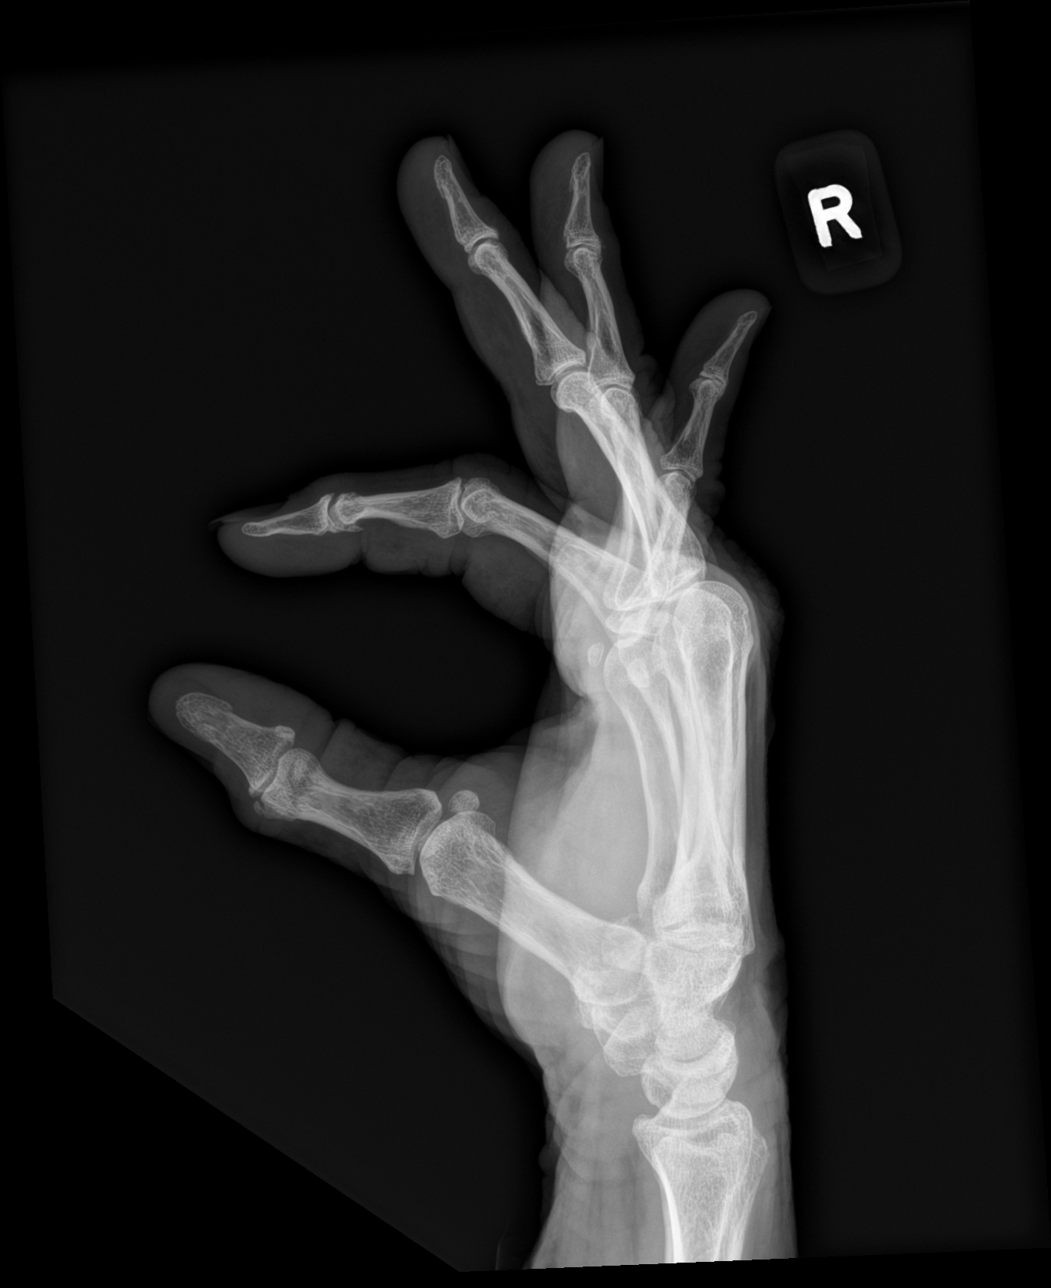

[3 of 3 positions shown; findings below may reference images not displayed]

FINDINGS: There is no evidence of fracture or dislocation. There is no
evidence of arthropathy or other focal bone abnormality. Soft
tissues are unremarkable.
IMPRESSION: Negative.

## 2022-01-22 MED ORDER — AMOXICILLIN-POT CLAVULANATE 875-125 MG PO TABS
1.0000 | ORAL_TABLET | Freq: Once | ORAL | Status: AC
  Administered 2022-01-22: 1 via ORAL
  Filled 2022-01-22: qty 1

## 2022-01-22 MED ORDER — RABIES IMMUNE GLOBULIN 150 UNIT/ML IM INJ
20.0000 [IU]/kg | INJECTION | Freq: Once | INTRAMUSCULAR | Status: AC
  Administered 2022-01-22: 1275 [IU] via INTRAMUSCULAR
  Filled 2022-01-22: qty 10

## 2022-01-22 MED ORDER — RABIES VACCINE, PCEC IM SUSR
1.0000 mL | Freq: Once | INTRAMUSCULAR | Status: AC
  Administered 2022-01-22: 1 mL via INTRAMUSCULAR
  Filled 2022-01-22: qty 1

## 2022-01-22 MED ORDER — AMOXICILLIN-POT CLAVULANATE 875-125 MG PO TABS
1.0000 | ORAL_TABLET | Freq: Two times a day (BID) | ORAL | 0 refills | Status: AC
Start: 2022-01-22 — End: 2022-01-29

## 2022-01-22 NOTE — ED Provider Notes (Signed)
?[facility] ?Provider Note ? ? ?CSN: [id number] ?Arrival date & time: 01/22/22  1816 ? ?  ? ?History ? ?Chief Complaint  ?Patient presents with  ? Animal Bite  ? ? ?Amber Hicks is a 71 y.o. female with a chief complaint of dog bite to the right palm of the hand.  States the dog bit her hand yesterday in the dog park and left one puncture wound.  The patient does not know the rabies vaccination status of the offending dog.  Now notes mild increasing redness and warmth of the hand.  Denies fever, shortness of breath, drooling, headache, or numbness/tingling of hand or fingers.  States range of motion of thumb is slightly becoming more tender.  Tdap within last year.  Never had the rabies vaccination series. ? ?The history is provided by the patient and medical records.  ?Animal Bite ? ?  ? ?Home Medications ?Prior to Admission medications   ?Medication Sig Start Date End Date Taking? Authorizing Provider  ?acetaminophen (TYLENOL) 325 MG tablet Take 650 mg by mouth every 6 (six) hours as needed for mild pain.    [provider]  ?cefdinir (OMNICEF) 300 MG capsule Take 1 capsule (300 mg total) by mouth 2 (two) times daily. Take this medicine with food. 10/30/21   Bronson Ing, DPM  ?diclofenac Sodium (VOLTAREN) 1 % GEL Apply 2 g topically 4 (four) times daily. ?Patient taking differently: Apply 2 g topically daily as needed. 07/19/21   Ghimire, Henreitta Leber, MD  ?lidocaine-prilocaine (EMLA) cream Apply 1 application topically as needed. Apply to the elbow and cover with saran wrap 1 hour prior to the shot ?Patient not taking: Reported on 07/17/2021 01/23/21   Gregor Hams, MD  ?lidocaine-prilocaine (EMLA) cream Apply 1 application topically as needed. Apply to the right great toe as needed for pain-  use at night to help you sleep 10/30/21   Bronson Ing, DPM  ?LORazepam (ATIVAN) 0.5 MG tablet 1-2 tabs 30 - 60 min prior to shot. Do not drive with this medicine. ?Patient  taking differently: Take 0.5 mg by mouth every 6 (six) hours as needed for anxiety. 09/08/21   Gregor Hams, MD  ?naproxen (NAPROSYN) 500 MG tablet Take 1 tablet (500 mg total) by mouth 2 (two) times daily as needed. ?Patient taking differently: Take 500 mg by mouth 2 (two) times daily as needed for moderate pain. 08/05/21   Gregor Hams, MD  ?omeprazole (PRILOSEC) 40 MG capsule Take 40 mg by mouth daily.    [provider]  ?rosuvastatin (CRESTOR) 20 MG tablet Take 1 tablet (20 mg total) by mouth daily. 01/15/21   Biagio Borg, MD  ?traMADol (ULTRAM) 50 MG tablet Take 1 tablet (50 mg total) by mouth every 6 (six) hours as needed for moderate pain. 10/09/21   Fredia Sorrow, MD  ?   ? ?Allergies    ?Doxycycline   ? ?Review of Systems   ?Review of Systems  ?Skin:  Positive for wound.  ? ?Physical Exam ?Updated Vital Signs ?BP 131/83   Pulse 78   Temp 98.2 ?F (36.8 ?C)   Resp 16   Ht '5\' 7"'$  (1.702 m)   Wt 63.5 kg   SpO2 99%   BMI 21.93 kg/m?  ?Physical Exam ?Vitals and nursing note reviewed.  ?Constitutional:   ?   General: She is not in acute distress. ?   Appearance: Normal appearance. She is well-developed. She is not ill-appearing or  diaphoretic.  ?HENT:  ?   Head: Normocephalic and atraumatic.  ?   Mouth/Throat:  ?   Mouth: Mucous membranes are moist.  ?   Pharynx: Oropharynx is clear.  ?Eyes:  ?   Conjunctiva/sclera: Conjunctivae normal.  ?Cardiovascular:  ?   Rate and Rhythm: Normal rate and regular rhythm.  ?   Pulses: Normal pulses.     ?     Radial pulses are 2+ on the right side and 2+ on the left side.  ?   Heart sounds: No murmur heard. ?Pulmonary:  ?   Effort: Pulmonary effort is normal. No respiratory distress.  ?   Breath sounds: Normal breath sounds.  ?Abdominal:  ?   Palpations: Abdomen is soft.  ?   Tenderness: There is no abdominal tenderness.  ?Musculoskeletal:     ?   General: No swelling.  ?     Hands: ? ?   Cervical back: Neck supple.  ?   Right lower leg: No edema.  ?   Left  lower leg: No edema.  ?   Comments: Mildly erythematous healing wound that appears further along than 24 hours of healing.  Wound tender to touch.  Mild tenderness with flexion and opposition of thumb.  Tenderness does not radiate.  No active bleeding/discharge.  Hands neurovascularly intact.  ?Skin: ?   General: Skin is warm and dry.  ?   Capillary Refill: Capillary refill takes less than 2 seconds.  ?Neurological:  ?   Mental Status: She is alert and oriented to person, place, and time.  ?Psychiatric:     ?   Mood and Affect: Mood normal.     ?   Speech: Speech is rapid and pressured and tangential.     ?   Behavior: Behavior is hyperactive. Behavior is cooperative.  ? ? ?ED Results / Procedures / Treatments   ?Labs ?(all labs ordered are listed, but only abnormal results are displayed) ?Labs Reviewed - No data to display ? ?EKG ?None ? ?Radiology ?DG Hand Complete Right ? ?Result Date: 01/22/2022 ?CLINICAL DATA:  Dog bite EXAM: RIGHT HAND - COMPLETE 3+ VIEW COMPARISON:  None. FINDINGS: There is no evidence of fracture or dislocation. There is no evidence of arthropathy or other focal bone abnormality. Soft tissues are unremarkable. IMPRESSION: Negative. Electronically Signed   By: Merilyn Baba M.D.   On: 01/22/2022 19:06   ? ?Procedures ?Procedures  ? ? ?Medications Ordered in ED ?Medications - No data to display ? ?ED Course/ Medical Decision Making/ A&P ?  ?                        ?Medical Decision Making ?Amount and/or Complexity of Data Reviewed ?External Data Reviewed: notes. ?Labs:  Decision-making details documented in ED Course. ?Radiology: ordered and independent interpretation performed. Decision-making details documented in ED Course. ?ECG/medicine tests: ordered and independent interpretation performed. Decision-making details documented in ED Course. ? ?Risk ?OTC drugs. ?Prescription drug management. ? ? ?71 y.o. female presents to the ED for concern of Animal Bite ? Marland Kitchen  This involves an extensive  number of treatment options, and is a complaint that carries with it a high risk of complications and morbidity.   ? ?Past Medical History / Co-morbidities / Social History: ?Depression, hyperlipidemia, vertigo, osteoporosis, GERD, aortic atherosclerosis, Vitamin D Deficiency ? ?Additional History:  ?Internal and external records from outside source obtained and reviewed including ED visits, podiatry ? ?Physical Exam: ?Physical exam  performed. The pertinent findings include: mildly erythematous healing wound that appears further along than 24 hours of healing.  Wound tender to touch.  Mild tenderness with flexion and opposition of thumb.  Tenderness does not radiate.  No active bleeding/discharge. ? ?Lab Tests: ?None ? ?Imaging Studies: ?I ordered imaging studies including x-ray of right hand .  I independently visualized and interpreted said imaging.  Pertinent results include: ?No acute bony pathology such as fracture or dislocation ?I agree with the radiologist interpretation. ? ?Medications: ?I ordered medication including Rabies vaccination series and augmentin  for Rabies prophylaxis/exposure therapy and antibiotic therapy.  Reevaluation of the patient after these medicines showed that the patient tolerated these well.  I have reviewed the patients home medicines and have made adjustments as needed ? ?ED Course/Disposition: ?Pt well-appearing on exam.  Patient presents with single small laceration from a dog bite.  1-2 cm wound noted on the right palm.  Appears to have been healing for more than 24 hours.   Pt wound irrigated well with 18ga angiocath with sterile saline.  Wound examined with visualization of the base and no foreign bodies seen.  Pt Alert and oriented, NAD, nontoxic, nonseptic appearing.  Not suspicious of tenosynovitis or cellulitis at this time.  Capillary refill intact and pt without neurologic deficit.  Right hand x-rays without evidence of acute pathology.  Patient tetanus already up to  date.  Patient rabies vaccine and immunoglobulin risk and benefit discussed.  Pt consents to rabies treatment.  Pain treated in the emergency department.  Wound not closed secondary to concern for infection.   ? ?I f

## 2022-01-22 NOTE — Discharge Instructions (Addendum)
You have been provided with 1 dose of antibiotic by the name of Augmentin today.  A prescription continuation of this antibiotic has been sent to your pharmacy.  Please take 1 pill every 12 hours for the next 7 days.  Always take with food and plenty of water.  Please take this until you have finished them all. ? ?The benefits of rabies prophylaxis has been discussed with you at great length.  The schedule has been attached in your paperwork.  Remember today is day 0 (ZERO).  You are to return for another shot three more times: day 3 (THREE), day 7 (SEVEN), and day 14 (FOURTEEN).  Please mark these on your calendar as discussed. ? ?Follow-up with your primary care within the next 3-5 days for reevaluation and continued medical management.  If you do not have a primary care already established, a list of providers has been provided below. ? ?Return to the ED for new or worsening symptoms as discussed ? ? ? ?If you do not have a primary care doctor, see the list below. ? ?RESOURCE GUIDE ? ?Chronic Pain Problems: ?Contact [facility]  [phone] ?Patients need to be referred by their primary care doctor. ? ?Insufficient Money for Medicine: ?Contact [employer]:  call "211" or [place] [phone]. ? ?No Primary Care Doctor: ?Call [facility]  [phone] - can help you locate a primary care doctor that  accepts your insurance, provides certain services, etc. ?Physician Referral Service- [phone] ?Agencies that provide inexpensive medical care: ?[facility]  [phone] ?[facility]  [phone] ?[facility]  [phone] ?[facility]  [phone] ?[facility]  [phone] ?[facility]  [phone] ? ?[place] Providers: ?[facility]- [address] ? [phone], Mon-Fri 9am-7pm, Sat 9am-1pm ?[place], [place] [address] ? [phone] ?[place], [address] ? [phone] ?[place]- [address] ? [phone] ?Lucianne Lei- [address], [phone] ? Only accepts [place] patients after they have their name  applied to their card ? ?Self Pay (no insurance) in [facility]: ?Sickle Cell Patients: Dr Kevan Ny, [place]  [facility] ? [address] [place] ?[facility]gent [place] ? [place] [facility] [place]- [address], [facility]- see information above (Speak to [employer] if you do not have insurance) ?      -  [facility]- [place], [place]- [place],  [place] ?      -  [facility]- [address] [place] ?      -  Dr Vista Lawman-  [address], [address], [place], [place] ?      -  [facility]- [address] [phone] ?      -  [facility]- [address], [place], also [address], [phone] ?      -    [facility]- [address], [place], 1st & 3rd Saturday   every month, 10am-1pm ? ?1) Find a Tax adviser and  Pay Out of Pocket ?Although you won't have to find out who is covered by your insurance plan, it is a good idea to ask around and get recommendations. You will then need to call the office and see if the doctor you have chosen will accept you as a new patient and what types of options they offer for patients who are self-pay. Some doctors offer discounts or will set up payment plans for their patients who do not have insurance, but you will need to ask so you aren't surprised when you get to your appointment. ? ?2) [place] Department ?Not all health departments have doctors that can see patients for sick visits, but many do, so it is worth a call to see if yours does. If you don't know where your local  health department is, you can check in your phone book. The CDC also has a tool to help you locate your state's health department, and many state websites also have listings of all of their local health departments. ? ?3) Find a [facility] ?If your illness is not likely to be very severe or complicated, you may want to try a walk in clinic. These are popping up all over the country in pharmacies, drugstores, and shopping centers. They're usually staffed by nurse practitioners or physician assistants that have been trained to treat common illnesses and complaints. They're usually fairly quick and inexpensive. However, if you have serious medical issues or chronic medical problems, these are probably not your best option ? ?STD Testing ?[place], [facility], [address] [place], phone [phone] or [phone].  Monday - Friday, call for an appointment. ?[place], [facility], [address], [place], phone [phone] or [phone].  Monday - Friday, call for an appointment. ? ?Abuse/Neglect: ?[place] [phone] ?[place] [phone] (After Hours) ? ?Emergency Shelter:  [facility] [phone] ? ?Maternity Homes: ?[facility] [phone] ?[place] [phone] ? ?MRSA Hotline #:   063-0160 ? ?[place] ? ?[facility] ?[address]  ?[place] ?Phone:  [phone]                                   Phone:  [phone]                 Phone:  [phone] ? ?[facility], [phone] ?[facility]  -  [facility]- [phone] ?      -     [facility] in [place], [address],                                  [phone], Insurance ? ?[place] ?[phone] or [phone] (After Hours) ? ?Behavioral Health Services ? ?Substance Abuse Resources: ?[facility]  [phone] ?[facility] [phone] ?[facility] [phone] ?[facility] [phone] ?Residential & Outpatient Substance Abuse Program  [phone] ? ?Psychological Services: ?[place]  [phone] ?[employer]  [phone] ?[facility], [place] [address], [place], [place]: [phone] or [phone], PicCapture[employer]uy ? ?Dental Assistance ? ?Patients with Medicaid: ?[place] ?[place] ?Phone:  [phone]                                                  Phone:  [phone] ? ?If unable to pay or uninsured, contact:  [place] or [facility]. to become qualified for the adult dental clinic. ? ?Patients with Medicaid: [place] ?[place], [phone] ?[place] [address], [place] ? ?If unable to pay, or uninsured, contact [facility] [phone]) or [place] [phone] in [place], [place] in [facility]) to become qualified for the adult dental clinic ? ?Other [place]: ?[facility]- [place], [place], [place], [address], [place], [place], 2nd and 4th Thursday of the month at 6:30am.  10 clients each day by appointment, can sometimes see walk-in patients if someone does not show for an appointment. ?[facility]- [address] [place], [place], [address], [phone] ?[place], [place], [place], [address], [place] ?[place] ?[facility]- [phone] ?[place] ? ?

## 2022-01-22 NOTE — ED Notes (Signed)
Attempted to get information in regards to the animal bite but pt denies having any information on the dog or the dog's owner. Pt stated that she got bite while at the park. RN asked if she contacted animal control but she denies doing so.  ?

## 2022-01-22 NOTE — ED Triage Notes (Signed)
Pt arrives ems. Pt states she was bit by a dog yesterday on her right hand while at the dog park. Pt has small wound on palm of right hand. No bleeding noted at this time. Pt states she was unable to get any information about the dog's vaccination status. Pt not utd on tetanus.  ?

## 2022-01-22 NOTE — Telephone Encounter (Signed)
?  Chief Complaint: Dog bite ?Symptoms: Bite to hand from small dog at park. ?Frequency: Yesterday afternoon ?Pertinent Negatives: Patient denies  ?Disposition: '[x]'$ ED /'[]'$ Urgent Care (no appt availability in office) / '[]'$ Appointment(In office/virtual)/ '[]'$  [facility]/ '[]'$ Home Care/ '[]'$ Refused Recommended Disposition /'[]'$  Mobile Bus/ '[]'$  Follow-up with PCP ?Additional Notes: Pt was bitten by a small dog at the park yesterday afternoon. Bite bleed quite a lot. Today hand is painful. Pt will go to ED for treatment. ? ? ? ? ?Reason for Disposition ? [1] Any break in skin from BITE (e.g., cut, puncture or scratch) AND[2] PET animal (e.g., dog, cat, or ferret) at risk for RABIES (e.g., sick, stray, unprovoked bite, developing country) ? ?Answer Assessment - Initial Assessment Questions ?1. ANIMAL: "What type of animal caused the bite?" "Is the injury from a bite or a claw?" If the animal is a dog or a cat, ask: "Was it a pet or a stray?" "Was it acting ill or behaving strangely?" ?    dog ?2. LOCATION: "Where is the bite located?"  ?    Right hand ?3. SIZE: "How big is the bite?" "What does it look like?"  ?    Couple of bites - small dog ?4. ONSET: "When did the bite happen?" (Minutes or hours ago)  ?    5:30pm ?5. CIRCUMSTANCES: "Tell me how this happened."  ?    Dog jumped and bit her ?6. TETANUS: "When was the last tetanus booster?" ?    Last year ?7. PREGNANCY: "Is there any chance you are pregnant?" "When was your last menstrual period?" ?    na ? ?Protocols used: Animal Bite-A-AH ? ?

## 2022-01-23 NOTE — ED Notes (Signed)
[facility] information given to registration to be faxed ?

## 2022-01-26 ENCOUNTER — Encounter (HOSPITAL_BASED_OUTPATIENT_CLINIC_OR_DEPARTMENT_OTHER): Payer: Self-pay

## 2022-01-26 ENCOUNTER — Emergency Department (HOSPITAL_BASED_OUTPATIENT_CLINIC_OR_DEPARTMENT_OTHER)
Admission: EM | Admit: 2022-01-26 | Discharge: 2022-01-26 | Disposition: A | Discharge status: Home / Self Care | Payer: Medicare Other | Attending: Emergency Medicine | Admitting: Emergency Medicine

## 2022-01-26 ENCOUNTER — Other Ambulatory Visit: Payer: Self-pay

## 2022-01-26 DIAGNOSIS — W540XXD Bitten by dog, subsequent encounter: Secondary | ICD-10-CM | POA: Diagnosis not present

## 2022-01-26 DIAGNOSIS — Z2914 Encounter for prophylactic rabies immune globin: Secondary | ICD-10-CM | POA: Diagnosis not present

## 2022-01-26 DIAGNOSIS — Z203 Contact with and (suspected) exposure to rabies: Secondary | ICD-10-CM | POA: Diagnosis not present

## 2022-01-26 DIAGNOSIS — Z23 Encounter for immunization: Secondary | ICD-10-CM | POA: Diagnosis not present

## 2022-01-26 MED ORDER — RABIES VACCINE, PCEC IM SUSR
1.0000 mL | Freq: Once | INTRAMUSCULAR | Status: AC
  Administered 2022-01-26: 1 mL via INTRAMUSCULAR
  Filled 2022-01-26: qty 1

## 2022-01-26 NOTE — ED Triage Notes (Signed)
Pt had initial rabies vaccine from an injury on 4/27. Pt presents today for follow up vaccine #2 in the series.  ?

## 2022-01-26 NOTE — Discharge Instructions (Signed)
Return on May 5, as well as May 12 for your last 2 vaccination shots. ?

## 2022-01-26 NOTE — ED Provider Notes (Signed)
?[facility] ?Provider Note ? ? ?CSN: [id number] ?Arrival date & time: 01/26/22  1331 ? ?  ? ?History ? ?Chief Complaint  ?Patient presents with  ? Follow-up  ? ? ?Amber Hicks is a 71 y.o. female. ? ?Patient presents for rabies vaccination #2.  She was bit by dog 4 days ago on her right hand.  She states it was being marked by a stranger on a leash when it lunged at her.  She does not have any more information on the dog and does not know who that person who owns a dog's.  Otherwise has been doing well.  Denies any fever vomiting cough or diarrhea has been keeping the wound covered. ? ? ?  ? ?Home Medications ?Prior to Admission medications   ?Medication Sig Start Date End Date Taking? Authorizing Provider  ?acetaminophen (TYLENOL) 325 MG tablet Take 650 mg by mouth every 6 (six) hours as needed for mild pain.    [provider]  ?amoxicillin-clavulanate (AUGMENTIN) 875-125 MG tablet Take 1 tablet by mouth 2 (two) times daily for 7 days. One po bid x 7 days 01/22/22 10/01/41  Prince Rome, PA-C  ?diclofenac Sodium (VOLTAREN) 1 % GEL Apply 2 g topically 4 (four) times daily. ?Patient taking differently: Apply 2 g topically daily as needed. 07/19/21   Ghimire, Henreitta Leber, MD  ?lidocaine-prilocaine (EMLA) cream Apply 1 application topically as needed. Apply to the elbow and cover with saran wrap 1 hour prior to the shot ?Patient not taking: Reported on 07/17/2021 01/23/21   Gregor Hams, MD  ?lidocaine-prilocaine (EMLA) cream Apply 1 application topically as needed. Apply to the right great toe as needed for pain-  use at night to help you sleep 10/30/21   Bronson Ing, DPM  ?LORazepam (ATIVAN) 0.5 MG tablet 1-2 tabs 30 - 60 min prior to shot. Do not drive with this medicine. ?Patient taking differently: Take 0.5 mg by mouth every 6 (six) hours as needed for anxiety. 09/08/21   Gregor Hams, MD  ?naproxen (NAPROSYN) 500 MG tablet Take 1 tablet (500 mg total) by mouth 2  (two) times daily as needed. ?Patient taking differently: Take 500 mg by mouth 2 (two) times daily as needed for moderate pain. 08/05/21   Gregor Hams, MD  ?omeprazole (PRILOSEC) 40 MG capsule Take 40 mg by mouth daily.    [provider]  ?rosuvastatin (CRESTOR) 20 MG tablet Take 1 tablet (20 mg total) by mouth daily. 01/15/21   Biagio Borg, MD  ?traMADol (ULTRAM) 50 MG tablet Take 1 tablet (50 mg total) by mouth every 6 (six) hours as needed for moderate pain. 10/09/21   Fredia Sorrow, MD  ?   ? ?Allergies    ?Doxycycline   ? ?Review of Systems   ?Review of Systems  ?Constitutional:  Negative for fever.  ?HENT:  Negative for ear pain.   ?Eyes:  Negative for pain.  ?Respiratory:  Negative for cough.   ?Cardiovascular:  Negative for chest pain.  ?Gastrointestinal:  Negative for abdominal pain.  ?Genitourinary:  Negative for flank pain.  ?Musculoskeletal:  Negative for back pain.  ?Skin:  Negative for rash.  ?Neurological:  Negative for headaches.  ? ?Physical Exam ?Updated Vital Signs ?BP (!) 98/58 (BP Location: Left Arm)   Pulse 64   Temp 98.2 ?F (36.8 ?C) (Oral)   Resp 20   Ht '5\' 7"'$  (1.702 m)   Wt 65.8 kg   SpO2 95%  BMI 22.71 kg/m?  ?Physical Exam ?Constitutional:   ?   General: She is not in acute distress. ?   Appearance: Normal appearance.  ?HENT:  ?   Head: Normocephalic.  ?   Nose: Nose normal.  ?Eyes:  ?   Extraocular Movements: Extraocular movements intact.  ?Cardiovascular:  ?   Rate and Rhythm: Normal rate.  ?Pulmonary:  ?   Effort: Pulmonary effort is normal.  ?Musculoskeletal:     ?   General: Normal range of motion.  ?   Cervical back: Normal range of motion.  ?Skin: ?   Comments: Right hand dog wound appears more consistent with mild graze.  Small scab formation and looks well-healed with no cellulitis.  ?Neurological:  ?   General: No focal deficit present.  ?   Mental Status: She is alert. Mental status is at baseline.  ? ? ?ED Results / Procedures / Treatments   ?Labs ?(all  labs ordered are listed, but only abnormal results are displayed) ?Labs Reviewed - No data to display ? ?EKG ?None ? ?Radiology ?No results found. ? ?Procedures ?Procedures  ? ? ?Medications Ordered in ED ?Medications  ?rabies vaccine (RABAVERT) injection 1 mL (has no administration in time range)  ? ? ?ED Course/ Medical Decision Making/ A&P ?  ?                        ?Medical Decision Making ?Risk ?Prescription drug management. ? ? ?Second rabies vaccination shot given today's day 4.  She is advised to come back in 4 days and in 11 days ? ? ? ? ? ? ? ?Final Clinical Impression(s) / ED Diagnoses ?Final diagnoses:  ?Need for rabies vaccination  ? ? ?Rx / DC Orders ?ED Discharge Orders   ? ? None  ? ?  ? ? ?  ?Luna Fuse, MD ?01/26/22 1439 ? ?

## 2022-01-26 NOTE — ED Notes (Signed)
Pt d/c home per MD order. Discharge summary reviewed, pt verbalizes understanding. Taxi Voucher provided per pt request. No s/s of acute distress noted at discharge.  ?

## 2022-01-30 ENCOUNTER — Emergency Department (HOSPITAL_BASED_OUTPATIENT_CLINIC_OR_DEPARTMENT_OTHER)
Admission: EM | Admit: 2022-01-30 | Discharge: 2022-01-30 | Disposition: A | Discharge status: Home / Self Care | Payer: Medicare Other | Attending: Emergency Medicine | Admitting: Emergency Medicine

## 2022-01-30 ENCOUNTER — Other Ambulatory Visit: Payer: Self-pay

## 2022-01-30 ENCOUNTER — Encounter (HOSPITAL_BASED_OUTPATIENT_CLINIC_OR_DEPARTMENT_OTHER): Payer: Self-pay | Admitting: Emergency Medicine

## 2022-01-30 DIAGNOSIS — Z2914 Encounter for prophylactic rabies immune globin: Secondary | ICD-10-CM | POA: Insufficient documentation

## 2022-01-30 DIAGNOSIS — Z23 Encounter for immunization: Secondary | ICD-10-CM

## 2022-01-30 DIAGNOSIS — Z203 Contact with and (suspected) exposure to rabies: Secondary | ICD-10-CM | POA: Insufficient documentation

## 2022-01-30 MED ORDER — RABIES VACCINE, PCEC IM SUSR
1.0000 mL | Freq: Once | INTRAMUSCULAR | Status: AC
  Administered 2022-01-30: 1 mL via INTRAMUSCULAR
  Filled 2022-01-30: qty 1

## 2022-01-30 NOTE — ED Provider Notes (Signed)
?[facility] ?Provider Note ? ? ?CSN: [id number] ?Arrival date & time: 01/30/22  0844 ? ?  ? ?History ? ?Chief Complaint  ?Patient presents with  ? Follow-up  ? ? ?Amber Hicks is a 71 y.o. female. ? ?71 year old female here for rabies vaccination series.  Has that several days ago on her right hand.  Has had no evidence of infection here.  No other complaints ? ? ?  ? ?Home Medications ?Prior to Admission medications   ?Medication Sig Start Date End Date Taking? Authorizing Provider  ?acetaminophen (TYLENOL) 325 MG tablet Take 650 mg by mouth every 6 (six) hours as needed for mild pain.    [provider]  ?diclofenac Sodium (VOLTAREN) 1 % GEL Apply 2 g topically 4 (four) times daily. ?Patient taking differently: Apply 2 g topically daily as needed. 07/19/21   Ghimire, Henreitta Leber, MD  ?lidocaine-prilocaine (EMLA) cream Apply 1 application topically as needed. Apply to the elbow and cover with saran wrap 1 hour prior to the shot ?Patient not taking: Reported on 07/17/2021 01/23/21   Gregor Hams, MD  ?lidocaine-prilocaine (EMLA) cream Apply 1 application topically as needed. Apply to the right great toe as needed for pain-  use at night to help you sleep 10/30/21   Bronson Ing, DPM  ?LORazepam (ATIVAN) 0.5 MG tablet 1-2 tabs 30 - 60 min prior to shot. Do not drive with this medicine. ?Patient taking differently: Take 0.5 mg by mouth every 6 (six) hours as needed for anxiety. 09/08/21   Gregor Hams, MD  ?naproxen (NAPROSYN) 500 MG tablet Take 1 tablet (500 mg total) by mouth 2 (two) times daily as needed. ?Patient taking differently: Take 500 mg by mouth 2 (two) times daily as needed for moderate pain. 08/05/21   Gregor Hams, MD  ?omeprazole (PRILOSEC) 40 MG capsule Take 40 mg by mouth daily.    [provider]  ?rosuvastatin (CRESTOR) 20 MG tablet Take 1 tablet (20 mg total) by mouth daily. 01/15/21   Biagio Borg, MD  ?traMADol (ULTRAM) 50 MG tablet Take 1  tablet (50 mg total) by mouth every 6 (six) hours as needed for moderate pain. 10/09/21   Fredia Sorrow, MD  ?   ? ?Allergies    ?Doxycycline   ? ?Review of Systems   ?Review of Systems  ?All other systems reviewed and are negative. ? ?Physical Exam ?Updated Vital Signs ?BP (!) 128/58 (BP Location: Right Arm)   Pulse 77   Temp 98.2 ?F (36.8 ?C) (Oral)   Resp 17   Ht 1.702 m ('5\' 7"'$ )   Wt 75 kg   SpO2 100%   BMI 25.88 kg/m?  ?Physical Exam ?Vitals and nursing note reviewed.  ?Constitutional:   ?   General: She is not in acute distress. ?   Appearance: Normal appearance. She is well-developed. She is not toxic-appearing.  ?HENT:  ?   Head: Normocephalic and atraumatic.  ?Eyes:  ?   General: Lids are normal.  ?   Conjunctiva/sclera: Conjunctivae normal.  ?   Pupils: Pupils are equal, round, and reactive to light.  ?Neck:  ?   Thyroid: No thyroid mass.  ?   Trachea: No tracheal deviation.  ?Cardiovascular:  ?   Rate and Rhythm: Normal rate and regular rhythm.  ?   Heart sounds: Normal heart sounds. No murmur heard. ?  No gallop.  ?Pulmonary:  ?   Effort: Pulmonary effort is normal. No respiratory distress.  ?  Breath sounds: Normal breath sounds. No stridor. No decreased breath sounds, wheezing, rhonchi or rales.  ?Abdominal:  ?   General: There is no distension.  ?   Palpations: Abdomen is soft.  ?   Tenderness: There is no abdominal tenderness. There is no rebound.  ?Musculoskeletal:     ?   General: No tenderness. Normal range of motion.  ?   Cervical back: Normal range of motion and neck supple.  ?   Comments: No evidence of infection at prior bite site.  On right hand  ?Skin: ?   General: Skin is warm and dry.  ?   Findings: No abrasion or rash.  ?Neurological:  ?   Mental Status: She is alert and oriented to person, place, and time. Mental status is at baseline.  ?   GCS: GCS eye subscore is 4. GCS verbal subscore is 5. GCS motor subscore is 6.  ?   Cranial Nerves: No cranial nerve deficit.  ?   Sensory:  No sensory deficit.  ?   Motor: Motor function is intact.  ?Psychiatric:     ?   Attention and Perception: Attention normal.     ?   Speech: Speech normal.     ?   Behavior: Behavior normal.  ? ? ?ED Results / Procedures / Treatments   ?Labs ?(all labs ordered are listed, but only abnormal results are displayed) ?Labs Reviewed - No data to display ? ?EKG ?None ? ?Radiology ?No results found. ? ?Procedures ?Procedures  ? ? ?Medications Ordered in ED ?Medications  ?rabies vaccine (RABAVERT) injection 1 mL (has no administration in time range)  ? ? ?ED Course/ Medical Decision Making/ A&P ?  ?                        ?Medical Decision Making ?Risk ?Prescription drug management. ? ? ?Patient given additional rabies vaccine series here.  We will follow-up with her doctor as needed ? ? ? ? ? ? ? ?Final Clinical Impression(s) / ED Diagnoses ?Final diagnoses:  ?None  ? ? ?Rx / DC Orders ?ED Discharge Orders   ? ? None  ? ?  ? ? ?  ?Lacretia Leigh, MD ?01/30/22 [phone] ? ?

## 2022-01-30 NOTE — ED Triage Notes (Signed)
Pt here for rabies follow up ?

## 2022-02-06 ENCOUNTER — Emergency Department (HOSPITAL_BASED_OUTPATIENT_CLINIC_OR_DEPARTMENT_OTHER)
Admission: EM | Admit: 2022-02-06 | Discharge: 2022-02-06 | Disposition: A | Discharge status: Home / Self Care | Payer: Medicare Other | Attending: Emergency Medicine | Admitting: Emergency Medicine

## 2022-02-06 ENCOUNTER — Encounter (HOSPITAL_BASED_OUTPATIENT_CLINIC_OR_DEPARTMENT_OTHER): Payer: Self-pay | Admitting: Emergency Medicine

## 2022-02-06 ENCOUNTER — Other Ambulatory Visit: Payer: Self-pay

## 2022-02-06 DIAGNOSIS — Z2914 Encounter for prophylactic rabies immune globin: Secondary | ICD-10-CM | POA: Diagnosis not present

## 2022-02-06 DIAGNOSIS — Z203 Contact with and (suspected) exposure to rabies: Secondary | ICD-10-CM | POA: Diagnosis not present

## 2022-02-06 DIAGNOSIS — Z23 Encounter for immunization: Secondary | ICD-10-CM | POA: Insufficient documentation

## 2022-02-06 MED ORDER — RABIES VACCINE, PCEC IM SUSR
1.0000 mL | Freq: Once | INTRAMUSCULAR | Status: AC
  Administered 2022-02-06: 1 mL via INTRAMUSCULAR
  Filled 2022-02-06: qty 1

## 2022-02-06 NOTE — ED Triage Notes (Signed)
Pt here for next rabies shot.  ?

## 2022-02-06 NOTE — ED Provider Notes (Signed)
?[facility] ?Provider Note ? ? ?CSN: [id number] ?Arrival date & time: 02/06/22  4098 ? ?  ? ?History ? ?Chief Complaint  ?Patient presents with  ? Follow-up  ? ? ?Amber Hicks is a 71 y.o. female.  She is here to complete her rabies vaccination.  She had a dog bite to her right hand 2 weeks ago.  She received immunoglobulin and vaccinations.  She is here for her day 14 vaccination.  No other complaints no signs of infection no fever. ? ?The history is provided by the patient.  ? ?  ? ?Home Medications ?Prior to Admission medications   ?Medication Sig Start Date End Date Taking? Authorizing Provider  ?acetaminophen (TYLENOL) 325 MG tablet Take 650 mg by mouth every 6 (six) hours as needed for mild pain.    [provider]  ?diclofenac Sodium (VOLTAREN) 1 % GEL Apply 2 g topically 4 (four) times daily. ?Patient taking differently: Apply 2 g topically daily as needed. 07/19/21   Ghimire, Henreitta Leber, MD  ?lidocaine-prilocaine (EMLA) cream Apply 1 application topically as needed. Apply to the elbow and cover with saran wrap 1 hour prior to the shot ?Patient not taking: Reported on 07/17/2021 01/23/21   Gregor Hams, MD  ?lidocaine-prilocaine (EMLA) cream Apply 1 application topically as needed. Apply to the right great toe as needed for pain-  use at night to help you sleep 10/30/21   Bronson Ing, DPM  ?LORazepam (ATIVAN) 0.5 MG tablet 1-2 tabs 30 - 60 min prior to shot. Do not drive with this medicine. ?Patient taking differently: Take 0.5 mg by mouth every 6 (six) hours as needed for anxiety. 09/08/21   Gregor Hams, MD  ?naproxen (NAPROSYN) 500 MG tablet Take 1 tablet (500 mg total) by mouth 2 (two) times daily as needed. ?Patient taking differently: Take 500 mg by mouth 2 (two) times daily as needed for moderate pain. 08/05/21   Gregor Hams, MD  ?omeprazole (PRILOSEC) 40 MG capsule Take 40 mg by mouth daily.    [provider]  ?rosuvastatin (CRESTOR) 20 MG  tablet Take 1 tablet (20 mg total) by mouth daily. 01/15/21   Biagio Borg, MD  ?traMADol (ULTRAM) 50 MG tablet Take 1 tablet (50 mg total) by mouth every 6 (six) hours as needed for moderate pain. 10/09/21   Fredia Sorrow, MD  ?   ? ?Allergies    ?Doxycycline   ? ?Review of Systems   ?Review of Systems  ?Constitutional:  Negative for fever.  ?Skin:  Negative for wound.  ? ?Physical Exam ?Updated Vital Signs ?BP (!) 152/79 (BP Location: Left Arm)   Pulse 75   Temp 98.4 ?F (36.9 ?C) (Oral)   Resp 18   Ht '5\' 7"'$  (1.702 m)   Wt 74.8 kg   SpO2 99%   BMI 25.84 kg/m?  ?Physical Exam ?Constitutional:   ?   Appearance: Normal appearance. She is well-developed.  ?HENT:  ?   Head: Normocephalic and atraumatic.  ?Eyes:  ?   Conjunctiva/sclera: Conjunctivae normal.  ?Cardiovascular:  ?   Rate and Rhythm: Normal rate and regular rhythm.  ?Pulmonary:  ?   Effort: Pulmonary effort is normal.  ?   Breath sounds: Normal breath sounds.  ?Musculoskeletal:  ?   Cervical back: Neck supple.  ?Skin: ?   General: Skin is warm and dry.  ?Neurological:  ?   General: No focal deficit present.  ?   Mental Status: She  is alert.  ?   GCS: GCS eye subscore is 4. GCS verbal subscore is 5. GCS motor subscore is 6.  ? ? ?ED Results / Procedures / Treatments   ?Labs ?(all labs ordered are listed, but only abnormal results are displayed) ?Labs Reviewed - No data to display ? ?EKG ?None ? ?Radiology ?No results found. ? ?Procedures ?Procedures  ? ? ?Medications Ordered in ED ?Medications  ?rabies vaccine (RABAVERT) injection 1 mL (has no administration in time range)  ? ? ?ED Course/ Medical Decision Making/ A&P ?  ?                        ?Medical Decision Making ?Risk ?Prescription drug management. ? ? ?71 year old female here for rabies vaccination.  This is her fourth dose.  She has no other complaints.  No signs of infection.  Given dose and return instructions discussed. ? ? ? ? ? ? ? ?Final Clinical Impression(s) / ED Diagnoses ?Final  diagnoses:  ?Need for rabies vaccination  ? ? ?Rx / DC Orders ?ED Discharge Orders   ? ? None  ? ?  ? ? ?  ?Hayden Rasmussen, MD ?02/06/22 [phone] ? ?

## 2022-02-06 NOTE — ED Notes (Signed)
#   3 Rabies shot given in series ?

## 2022-02-17 ENCOUNTER — Encounter: Payer: Self-pay | Admitting: Internal Medicine

## 2022-03-05 ENCOUNTER — Ambulatory Visit (HOSPITAL_BASED_OUTPATIENT_CLINIC_OR_DEPARTMENT_OTHER): Payer: Medicare Other | Admitting: Nurse Practitioner

## 2022-03-26 ENCOUNTER — Emergency Department (HOSPITAL_COMMUNITY)
Admission: EM | Admit: 2022-03-26 | Discharge: 2022-03-26 | Disposition: A | Discharge status: Home / Self Care | Payer: Medicare Other | Attending: Emergency Medicine | Admitting: Emergency Medicine

## 2022-03-26 ENCOUNTER — Encounter (HOSPITAL_COMMUNITY): Payer: Self-pay | Admitting: Emergency Medicine

## 2022-03-26 DIAGNOSIS — W540XXA Bitten by dog, initial encounter: Secondary | ICD-10-CM | POA: Diagnosis not present

## 2022-03-26 DIAGNOSIS — S31829A Unspecified open wound of left buttock, initial encounter: Secondary | ICD-10-CM | POA: Insufficient documentation

## 2022-03-26 DIAGNOSIS — R531 Weakness: Secondary | ICD-10-CM | POA: Diagnosis not present

## 2022-03-26 DIAGNOSIS — Z743 Need for continuous supervision: Secondary | ICD-10-CM | POA: Diagnosis not present

## 2022-03-26 DIAGNOSIS — S31825A Open bite of left buttock, initial encounter: Secondary | ICD-10-CM | POA: Diagnosis not present

## 2022-03-26 MED ORDER — ACETAMINOPHEN 500 MG PO TABS
1000.0000 mg | ORAL_TABLET | Freq: Once | ORAL | Status: AC
  Administered 2022-03-26: 1000 mg via ORAL
  Filled 2022-03-26: qty 2

## 2022-03-26 MED ORDER — NAPROXEN 500 MG PO TABS: 500.0000 mg | ORAL_TABLET | Freq: Two times a day (BID) | ORAL | 2 refills | Status: DC | PRN

## 2022-03-26 MED ORDER — LIDOCAINE-PRILOCAINE 2.5-2.5 % EX CREA
1.0000 | TOPICAL_CREAM | CUTANEOUS | 1 refills | Status: AC | PRN
Start: 2022-03-26 — End: ?

## 2022-03-26 MED ORDER — AMOXICILLIN-POT CLAVULANATE 875-125 MG PO TABS: 1.0000 | ORAL_TABLET | Freq: Two times a day (BID) | ORAL | 0 refills | Status: AC

## 2022-03-26 NOTE — ED Provider Triage Note (Signed)
Emergency Medicine Provider Triage Evaluation Note  Amber Hicks , a 71 y.o. female  was evaluated in triage.  Pt complains of dog bite to left buttock.  Patient states the injury happened yesterday afternoon.  Patient states that her left buttock is very painful and very tender to the touch.  Patient states she put Neosporin on the region last night and that it burned.  Animal control does have the dog in custody.  No other injuries reported  Review of Systems  Positive: Left buttock pain Negative: Fall, head injury  Physical Exam  There were no vitals taken for this visit. Gen:   Awake, no distress   Resp:  Normal effort  MSK:   Moves extremities without difficulty  Other:  No signs of penetration of skin  Medical Decision Making  Medically screening exam initiated at 5:03 PM.  Appropriate orders placed.  Amber Hicks was informed that the remainder of the evaluation will be completed by another provider, this initial triage assessment does not replace that evaluation, and the importance of remaining in the ED until their evaluation is complete.     Dorothyann Peng, PA-C 03/26/22 1706

## 2022-03-26 NOTE — Discharge Instructions (Signed)
You have been evaluated for your animal bite.  Fortunately no significant signs of injury noted to your left buttock however if you notice worsening pain or redness you may take Augmentin prescribed to treat for potential skin infection.  You may apply numbing gel to the affected area as needed for pain.  Follow-up with your doctor for further care.  Please continue to follow-up on the status of the dog to make sure there are no concerns for rabies.  If you are concerned you may need to follow-up with urgent care center to receive rabies series shots.

## 2022-03-26 NOTE — ED Triage Notes (Signed)
Patient BIB [facility] from home for evaluation of a dog bite on left buttock that occurred yesterday. Vaccination status of the dog is unknown but EMS reports dog's status should be available within the next few hours.  EMS Vitals BP 136/80 HR 90 RR 18, SpO2 99% on room air

## 2022-03-26 NOTE — ED Provider Notes (Signed)
[facility] EMERGENCY DEPARTMENT Provider Note   CSN: [id number] Arrival date & time: 03/26/22  1651     History  No chief complaint on file.   Amber Hicks is a 71 y.o. female.  The history is provided by the patient and medical records. No language interpreter was used.    72 year old female brought here via EMS from home for evaluation of a dog bite.  Patient report yesterday she was walking around her neighborhood when one of the neighbors dog got loose and bit her in her left buttock.  She mention the dog also bit another dog and several other persons.  She reports she did not have too much pain initially but throughout the day today she noticed more pain to her buttock region.  She mention animal control did capture the dog.  Unsure of rabies status of the dog.  Patient is up-to-date with tetanus.  She denies any specific treatment tried.  She is complaining of sharp pain to her left buttock.  She denies any numbness  Home Medications Prior to Admission medications   Medication Sig Start Date End Date Taking? Authorizing Provider  acetaminophen (TYLENOL) 325 MG tablet Take 650 mg by mouth every 6 (six) hours as needed for mild pain.    [provider]  diclofenac Sodium (VOLTAREN) 1 % GEL Apply 2 g topically 4 (four) times daily. Patient taking differently: Apply 2 g topically daily as needed. 07/19/21   Ghimire, Henreitta Leber, MD  lidocaine-prilocaine (EMLA) cream Apply 1 application topically as needed. Apply to the elbow and cover with saran wrap 1 hour prior to the shot Patient not taking: Reported on 07/17/2021 01/23/21   Gregor Hams, MD  lidocaine-prilocaine (EMLA) cream Apply 1 application topically as needed. Apply to the right great toe as needed for pain-  use at night to help you sleep 10/30/21   Bronson Ing, DPM  LORazepam (ATIVAN) 0.5 MG tablet 1-2 tabs 30 - 60 min prior to shot. Do not drive with this medicine. Patient taking  differently: Take 0.5 mg by mouth every 6 (six) hours as needed for anxiety. 09/08/21   Gregor Hams, MD  naproxen (NAPROSYN) 500 MG tablet Take 1 tablet (500 mg total) by mouth 2 (two) times daily as needed. Patient taking differently: Take 500 mg by mouth 2 (two) times daily as needed for moderate pain. 08/05/21   Gregor Hams, MD  omeprazole (PRILOSEC) 40 MG capsule Take 40 mg by mouth daily.    [provider]  rosuvastatin (CRESTOR) 20 MG tablet Take 1 tablet (20 mg total) by mouth daily. 01/15/21   Biagio Borg, MD  traMADol (ULTRAM) 50 MG tablet Take 1 tablet (50 mg total) by mouth every 6 (six) hours as needed for moderate pain. 10/09/21   Fredia Sorrow, MD      Allergies    Doxycycline    Review of Systems   Review of Systems  All other systems reviewed and are negative.   Physical Exam Updated Vital Signs BP (!) 143/65 (BP Location: Left Arm)   Pulse 75   Temp 98.2 F (36.8 C) (Oral)   Resp 16   SpO2 95%  Physical Exam Vitals and nursing note reviewed.  Constitutional:      General: She is not in acute distress.    Appearance: She is well-developed.  HENT:     Head: Atraumatic.  Eyes:     Conjunctiva/sclera: Conjunctivae normal.  Pulmonary:  Effort: Pulmonary effort is normal.  Genitourinary:    Comments: Chaperone present during exam.  Left buttock that is 2 dime size ecchymosis noted to the mid buttock without any break in the skin and no puncture wound noted.  Area tender to palpation. Musculoskeletal:     Cervical back: Neck supple.  Skin:    Findings: No rash.  Neurological:     Mental Status: She is alert and oriented to person, place, and time.  Psychiatric:        Mood and Affect: Mood normal.     ED Results / Procedures / Treatments   Labs (all labs ordered are listed, but only abnormal results are displayed) Labs Reviewed - No data to display  EKG None  Radiology No results found.  Procedures Procedures    Medications  Ordered in ED Medications - No data to display  ED Course/ Medical Decision Making/ A&P                           Medical Decision Making  BP (!) 143/65 (BP Location: Left Arm)   Pulse 75   Temp 98.2 F (36.8 C) (Oral)   Resp 16   SpO2 95%   8:20 PM This is a 71 year old female who presents for evaluation of dog bite.  Patient reports she was walking around the neighborhood yesterday when her neighbors dog got loose, ran and bit her in her left buttock.  She also report the dog also bit several other people in the same day as well as another dog.  Animal control does have possession of the dog.  She states she slept fine last night but this morning she noticed pain to her left buttock.  Rate is sharp throbbing 8 out of 10 pain nonradiating.  She is up-to-date with tetanus.  She does not complain of any fever.  She denies any specific treatment tried.  On exam, left buttock there is 2 dime sized ecchymosis noted to the mid buttock likely from the bite.  It is tender to palpation without any break in the skin.  No puncture wound or laceration.  No erythema or warmth.  At this time I do not suspect any bony injury or any deep tissue injury.  Therefore, I have considered imaging but felt that it is low yield.  Patient is up-to-date with tetanus.  Furthermore, the docs in the possessions of animal control and after considering treatment of rabies, we felt we can follow-up on the dog status through animal control and hold off on rabies shots at this time.  After further discussion, I plan to prescribe patient Augmentin antibiotic because she fears of infection.  Tylenol given for pain.  After receiving medication patient felt better.          Final Clinical Impression(s) / ED Diagnoses Final diagnoses:  Dog bite of left buttock, initial encounter    Rx / DC Orders ED Discharge Orders          Ordered    lidocaine-prilocaine (EMLA) cream  As needed        03/26/22 2114     amoxicillin-clavulanate (AUGMENTIN) 875-125 MG tablet  Every 12 hours        03/26/22 2114    naproxen (NAPROSYN) 500 MG tablet  2 times daily PRN        03/26/22 2114              Domenic Moras, PA-C 03/26/22  2116    Hayden Rasmussen, MD 03/27/22 1032

## 2022-03-30 ENCOUNTER — Other Ambulatory Visit: Payer: Medicare Other

## 2022-05-22 DIAGNOSIS — H25811 Combined forms of age-related cataract, right eye: Secondary | ICD-10-CM | POA: Diagnosis not present

## 2022-07-22 ENCOUNTER — Other Ambulatory Visit: Payer: Self-pay | Admitting: Internal Medicine

## 2022-07-22 DIAGNOSIS — Z1231 Encounter for screening mammogram for malignant neoplasm of breast: Secondary | ICD-10-CM

## 2022-08-17 ENCOUNTER — Emergency Department (HOSPITAL_COMMUNITY): Payer: Medicare Other

## 2022-08-17 ENCOUNTER — Encounter (HOSPITAL_COMMUNITY): Payer: Self-pay

## 2022-08-17 ENCOUNTER — Other Ambulatory Visit: Payer: Self-pay

## 2022-08-17 ENCOUNTER — Emergency Department (HOSPITAL_COMMUNITY)
Admission: EM | Admit: 2022-08-17 | Discharge: 2022-08-17 | Disposition: A | Discharge status: Home / Self Care | Payer: Medicare Other | Attending: Emergency Medicine | Admitting: Emergency Medicine

## 2022-08-17 DIAGNOSIS — W010XXA Fall on same level from slipping, tripping and stumbling without subsequent striking against object, initial encounter: Secondary | ICD-10-CM | POA: Diagnosis not present

## 2022-08-17 DIAGNOSIS — S80211A Abrasion, right knee, initial encounter: Secondary | ICD-10-CM | POA: Insufficient documentation

## 2022-08-17 DIAGNOSIS — S80212A Abrasion, left knee, initial encounter: Secondary | ICD-10-CM | POA: Diagnosis not present

## 2022-08-17 DIAGNOSIS — R6889 Other general symptoms and signs: Secondary | ICD-10-CM | POA: Diagnosis not present

## 2022-08-17 DIAGNOSIS — W19XXXA Unspecified fall, initial encounter: Secondary | ICD-10-CM | POA: Diagnosis not present

## 2022-08-17 DIAGNOSIS — T699XXA Effect of reduced temperature, unspecified, initial encounter: Secondary | ICD-10-CM | POA: Diagnosis not present

## 2022-08-17 DIAGNOSIS — M25521 Pain in right elbow: Secondary | ICD-10-CM | POA: Diagnosis not present

## 2022-08-17 DIAGNOSIS — M25562 Pain in left knee: Secondary | ICD-10-CM | POA: Diagnosis not present

## 2022-08-17 DIAGNOSIS — S0990XA Unspecified injury of head, initial encounter: Secondary | ICD-10-CM | POA: Diagnosis not present

## 2022-08-17 DIAGNOSIS — M25561 Pain in right knee: Secondary | ICD-10-CM | POA: Diagnosis not present

## 2022-08-17 DIAGNOSIS — S0083XA Contusion of other part of head, initial encounter: Secondary | ICD-10-CM | POA: Diagnosis not present

## 2022-08-17 DIAGNOSIS — S0993XA Unspecified injury of face, initial encounter: Secondary | ICD-10-CM | POA: Diagnosis not present

## 2022-08-17 DIAGNOSIS — Z743 Need for continuous supervision: Secondary | ICD-10-CM | POA: Diagnosis not present

## 2022-08-17 DIAGNOSIS — Y92481 Parking lot as the place of occurrence of the external cause: Secondary | ICD-10-CM | POA: Diagnosis not present

## 2022-08-17 DIAGNOSIS — S50311A Abrasion of right elbow, initial encounter: Secondary | ICD-10-CM | POA: Insufficient documentation

## 2022-08-17 DIAGNOSIS — R519 Headache, unspecified: Secondary | ICD-10-CM | POA: Diagnosis present

## 2022-08-17 DIAGNOSIS — S199XXA Unspecified injury of neck, initial encounter: Secondary | ICD-10-CM | POA: Diagnosis not present

## 2022-08-17 MED ORDER — ACETAMINOPHEN 325 MG PO TABS
650.0000 mg | ORAL_TABLET | Freq: Once | ORAL | Status: AC
  Administered 2022-08-17: 650 mg via ORAL
  Filled 2022-08-17: qty 2

## 2022-08-17 NOTE — ED Provider Notes (Signed)
[facility] EMERGENCY DEPARTMENT Provider Note   CSN: [id number] Arrival date & time: 08/17/22  1537     History  Chief Complaint  Patient presents with   Amber Hicks is a 71 y.o. female.  HPI 71 year old female with a history of GERD, depression, hyperlipidemia, colon polyps presents to the ER with complaints of a fall.  She reports she was in the [employer] parking lot earlier in the day when she tripped and lost her balance falling face forward onto the concrete.  Denies any loss of consciousness.  Complaining of facial pain, bilateral knee pain and right elbow pain.  She was able to ambulate after the incident.  She is not on blood thinners.  Denied any chest pain, dizziness, visual disturbance, gait abnormalities, weakness/sensory deficits in the upper and lower extremities, slurred speech, facial droop.  No recent fevers, chills, cough, night sweats, dizziness or lightheadedness.  She was given Tylenol in triage.    Home Medications Prior to Admission medications   Medication Sig Start Date End Date Taking? Authorizing Provider  acetaminophen (TYLENOL) 325 MG tablet Take 650 mg by mouth every 6 (six) hours as needed for mild pain.    [provider]  amoxicillin-clavulanate (AUGMENTIN) 875-125 MG tablet Take 1 tablet by mouth every 12 (twelve) hours. 03/26/22   Domenic Moras, PA-C  diclofenac Sodium (VOLTAREN) 1 % GEL Apply 2 g topically 4 (four) times daily. Patient taking differently: Apply 2 g topically daily as needed. 07/19/21   Ghimire, Henreitta Leber, MD  lidocaine-prilocaine (EMLA) cream Apply 1 Application topically as needed. Apply to left buttock and cover with saran wrap 1 hour as needed for pain 03/26/22   Domenic Moras, PA-C  LORazepam (ATIVAN) 0.5 MG tablet 1-2 tabs 30 - 60 min prior to shot. Do not drive with this medicine. Patient taking differently: Take 0.5 mg by mouth every 6 (six) hours as needed for anxiety. 09/08/21   Gregor Hams, MD  naproxen (NAPROSYN) 500 MG tablet Take 1 tablet (500 mg total) by mouth 2 (two) times daily as needed. 03/26/22   Domenic Moras, PA-C  omeprazole (PRILOSEC) 40 MG capsule Take 40 mg by mouth daily.    [provider]  rosuvastatin (CRESTOR) 20 MG tablet Take 1 tablet (20 mg total) by mouth daily. 01/15/21   Biagio Borg, MD  traMADol (ULTRAM) 50 MG tablet Take 1 tablet (50 mg total) by mouth every 6 (six) hours as needed for moderate pain. 10/09/21   Fredia Sorrow, MD      Allergies    Doxycycline    Review of Systems   Review of Systems Ten systems reviewed and are negative for acute change, except as noted in the HPI.   Physical Exam Updated Vital Signs BP (!) 145/83   Pulse 69   Temp 98.3 F (36.8 C) (Oral)   Resp 16   Ht '5\' 7"'$  (1.702 m)   Wt 74.8 kg   SpO2 99%   BMI 25.84 kg/m  Physical Exam Vitals and nursing note reviewed.  Constitutional:      General: She is not in acute distress.    Appearance: She is well-developed.  HENT:     Head: Normocephalic and atraumatic.  Eyes:     Conjunctiva/sclera: Conjunctivae normal.  Cardiovascular:     Rate and Rhythm: Normal rate and regular rhythm.     Heart sounds: No murmur heard. Pulmonary:     Effort: Pulmonary  effort is normal. No respiratory distress.     Breath sounds: Normal breath sounds.  Abdominal:     Palpations: Abdomen is soft.     Tenderness: There is no abdominal tenderness.  Musculoskeletal:        General: Signs of injury present. No swelling.     Cervical back: Neck supple.     Comments: Superficial abrasions to the face, no raccoon eyes, no hemotympanum, EOMs are intact.  Superficial abrasion to the right elbow and bilateral knees.  Full flexion and extension of all 4 joints with no evidence of deformities.  5/5 strength in upper lower extremities bilaterally.  No midline versus C, T, C-spine.  Moving all 4 extremities without difficulty.  Skin:    General: Skin is warm and dry.      Capillary Refill: Capillary refill takes less than 2 seconds.  Neurological:     General: No focal deficit present.     Mental Status: She is alert and oriented to person, place, and time.     Sensory: No sensory deficit.     Motor: No weakness.     Comments: No facial droop, cranial nerves intact  Psychiatric:        Mood and Affect: Mood normal.     ED Results / Procedures / Treatments   Labs (all labs ordered are listed, but only abnormal results are displayed) Labs Reviewed - No data to display  EKG None  Radiology DG Knee Complete 4 Views Right  Result Date: 08/17/2022 CLINICAL DATA:  Fall.  Pain EXAM: RIGHT KNEE - COMPLETE 4+ VIEW COMPARISON:  None Available. FINDINGS: No evidence of fracture, dislocation, or joint effusion. No evidence of arthropathy or other focal bone abnormality. Soft tissues are unremarkable. IMPRESSION: Negative. Electronically Signed   By: Iven Finn M.D.   On: 08/17/2022 18:07   DG Knee Complete 4 Views Left  Result Date: 08/17/2022 CLINICAL DATA:  Fall.  Pain EXAM: LEFT KNEE - COMPLETE 4+ VIEW COMPARISON:  None Available. FINDINGS: No evidence of fracture, dislocation, or joint effusion. Mild medial tibiofemoral compartment degenerative changes. Soft tissues are unremarkable. IMPRESSION: No acute displaced fracture or dislocation. Electronically Signed   By: Iven Finn M.D.   On: 08/17/2022 18:06   DG Elbow Complete Right  Result Date: 08/17/2022 CLINICAL DATA:  pain EXAM: RIGHT ELBOW - COMPLETE 3+ VIEW COMPARISON:  None Available. FINDINGS: There is no evidence of fracture, dislocation, or joint effusion. There is no evidence of arthropathy or other focal bone abnormality. Soft tissues are unremarkable. IMPRESSION: Negative. Electronically Signed   By: Iven Finn M.D.   On: 08/17/2022 18:05   CT Head Wo Contrast  Result Date: 08/17/2022 CLINICAL DATA:  Head trauma, minor (Age >= 65y); Neck trauma (Age >= 65y); Facial trauma,  blunt. Fall EXAM: CT HEAD WITHOUT CONTRAST CT MAXILLOFACIAL WITHOUT CONTRAST CT CERVICAL SPINE WITHOUT CONTRAST TECHNIQUE: Multidetector CT imaging of the head, cervical spine, and maxillofacial structures were performed using the standard protocol without intravenous contrast. Multiplanar CT image reconstructions of the cervical spine and maxillofacial structures were also generated. RADIATION DOSE REDUCTION: This exam was performed according to the departmental dose-optimization program which includes automated exposure control, adjustment of the mA and/or kV according to patient size and/or use of iterative reconstruction technique. COMPARISON:  None Available. FINDINGS: CT HEAD FINDINGS Brain: No evidence of large-territorial acute infarction. No parenchymal hemorrhage. No mass lesion. No extra-axial collection. No mass effect or midline shift. No hydrocephalus. Basilar cisterns are patent.  Vascular: No hyperdense vessel. Atherosclerotic calcifications are present within the cavernous internal carotid arteries. Skull: No acute fracture or focal lesion. Other: None. CT MAXILLOFACIAL FINDINGS Osseous: No fracture or mandibular dislocation. No destructive process. Sinuses/Orbits: Mild right maxillary mucosal thickening. Paranasal sinuses and mastoid air cells are clear. Bilateral lens replacement. Otherwise the orbits are unremarkable. Soft tissues: Negative. CT CERVICAL SPINE FINDINGS Alignment: Normal. Skull base and vertebrae: Mild multilevel degenerative changes spine. No acute fracture. No aggressive appearing focal osseous lesion or focal pathologic process. Soft tissues and spinal canal: No prevertebral fluid or swelling. No visible canal hematoma. Upper chest: Unremarkable. Other: Mild atherosclerotic plaque of the carotid arteries within the neck. IMPRESSION: 1. No acute intracranial abnormality. 2. No acute displaced facial fracture. 3. No acute displaced fracture or traumatic listhesis of the cervical  spine. Electronically Signed   By: Iven Finn M.D.   On: 08/17/2022 18:05   CT Cervical Spine Wo Contrast  Result Date: 08/17/2022 CLINICAL DATA:  Head trauma, minor (Age >= 65y); Neck trauma (Age >= 65y); Facial trauma, blunt. Fall EXAM: CT HEAD WITHOUT CONTRAST CT MAXILLOFACIAL WITHOUT CONTRAST CT CERVICAL SPINE WITHOUT CONTRAST TECHNIQUE: Multidetector CT imaging of the head, cervical spine, and maxillofacial structures were performed using the standard protocol without intravenous contrast. Multiplanar CT image reconstructions of the cervical spine and maxillofacial structures were also generated. RADIATION DOSE REDUCTION: This exam was performed according to the departmental dose-optimization program which includes automated exposure control, adjustment of the mA and/or kV according to patient size and/or use of iterative reconstruction technique. COMPARISON:  None Available. FINDINGS: CT HEAD FINDINGS Brain: No evidence of large-territorial acute infarction. No parenchymal hemorrhage. No mass lesion. No extra-axial collection. No mass effect or midline shift. No hydrocephalus. Basilar cisterns are patent. Vascular: No hyperdense vessel. Atherosclerotic calcifications are present within the cavernous internal carotid arteries. Skull: No acute fracture or focal lesion. Other: None. CT MAXILLOFACIAL FINDINGS Osseous: No fracture or mandibular dislocation. No destructive process. Sinuses/Orbits: Mild right maxillary mucosal thickening. Paranasal sinuses and mastoid air cells are clear. Bilateral lens replacement. Otherwise the orbits are unremarkable. Soft tissues: Negative. CT CERVICAL SPINE FINDINGS Alignment: Normal. Skull base and vertebrae: Mild multilevel degenerative changes spine. No acute fracture. No aggressive appearing focal osseous lesion or focal pathologic process. Soft tissues and spinal canal: No prevertebral fluid or swelling. No visible canal hematoma. Upper chest: Unremarkable. Other:  Mild atherosclerotic plaque of the carotid arteries within the neck. IMPRESSION: 1. No acute intracranial abnormality. 2. No acute displaced facial fracture. 3. No acute displaced fracture or traumatic listhesis of the cervical spine. Electronically Signed   By: Iven Finn M.D.   On: 08/17/2022 18:05   CT Maxillofacial WO CM  Result Date: 08/17/2022 CLINICAL DATA:  Head trauma, minor (Age >= 65y); Neck trauma (Age >= 65y); Facial trauma, blunt. Fall EXAM: CT HEAD WITHOUT CONTRAST CT MAXILLOFACIAL WITHOUT CONTRAST CT CERVICAL SPINE WITHOUT CONTRAST TECHNIQUE: Multidetector CT imaging of the head, cervical spine, and maxillofacial structures were performed using the standard protocol without intravenous contrast. Multiplanar CT image reconstructions of the cervical spine and maxillofacial structures were also generated. RADIATION DOSE REDUCTION: This exam was performed according to the departmental dose-optimization program which includes automated exposure control, adjustment of the mA and/or kV according to patient size and/or use of iterative reconstruction technique. COMPARISON:  None Available. FINDINGS: CT HEAD FINDINGS Brain: No evidence of large-territorial acute infarction. No parenchymal hemorrhage. No mass lesion. No extra-axial collection. No mass effect or midline shift. No  hydrocephalus. Basilar cisterns are patent. Vascular: No hyperdense vessel. Atherosclerotic calcifications are present within the cavernous internal carotid arteries. Skull: No acute fracture or focal lesion. Other: None. CT MAXILLOFACIAL FINDINGS Osseous: No fracture or mandibular dislocation. No destructive process. Sinuses/Orbits: Mild right maxillary mucosal thickening. Paranasal sinuses and mastoid air cells are clear. Bilateral lens replacement. Otherwise the orbits are unremarkable. Soft tissues: Negative. CT CERVICAL SPINE FINDINGS Alignment: Normal. Skull base and vertebrae: Mild multilevel degenerative changes  spine. No acute fracture. No aggressive appearing focal osseous lesion or focal pathologic process. Soft tissues and spinal canal: No prevertebral fluid or swelling. No visible canal hematoma. Upper chest: Unremarkable. Other: Mild atherosclerotic plaque of the carotid arteries within the neck. IMPRESSION: 1. No acute intracranial abnormality. 2. No acute displaced facial fracture. 3. No acute displaced fracture or traumatic listhesis of the cervical spine. Electronically Signed   By: Iven Finn M.D.   On: 08/17/2022 18:05    Procedures Procedures    Medications Ordered in ED Medications  acetaminophen (TYLENOL) tablet 650 mg (650 mg Oral Given 08/17/22 1649)    ED Course/ Medical Decision Making/ A&P                           Medical Decision Making  71 year old female who presents to the ER after mechanical fall in a parking lot.  Visible superficial abrasions to the face, right elbow and bilateral knees, but not amenable to suturing.  Vitals overall reassuring.  Physical exam otherwise without any neurologic deficits.  Imaging ordered in triage, reviewed, agree with radiology read.  CT of the head, cervical spine and maxillofacial without any evidence of trauma.  X-rays of the right and left knees and right elbow without any evidence of fractures.  Patient was given Tylenol in triage.  Low suspicion for stroke, ACS, PE, sepsis.  Per report, fall appears mechanical.  Encouraged Tylenol for pain.  Encouraged PCP follow-up.  We discussed return precautions.  She voiced her standing and is agreeable.  Stable for discharge. Final Clinical Impression(s) / ED Diagnoses Final diagnoses:  Fall, initial encounter  Contusion of face, initial encounter    Rx / DC Orders ED Discharge Orders     None         Lyndel Safe 08/17/22 2120    Wynona Dove A, DO 08/18/22 2346

## 2022-08-17 NOTE — Discharge Instructions (Signed)
You were evaluated in the Emergency Department and after careful evaluation, we did not find any emergent condition requiring admission or further testing in the hospital.  Your scans today were overall reassuring, there were no signs of trauma or broken bones.  You may experience some soreness after the fall.  Please clean your wounds with soap and water, watch for signs of infection including worsening redness, swelling, fevers, chills.  You may take Tylenol for pain  Please return to the Emergency Department if you experience any worsening of your condition.  We encourage you to follow up with a primary care provider.  Thank you for allowing [place] to be a part of your care.

## 2022-08-17 NOTE — ED Notes (Signed)
Patient verbalizes understanding of d/c instructions. Opportunities for questions and answers were provided. Pt d/c from ED and ambulated to lobby where a friend will be picking pt up.

## 2022-08-17 NOTE — ED Triage Notes (Signed)
Patient reports she tripped and fell landing on concrete.  No blood thinners No LOC.  Patient has abrasion to forehead and nose.  Patient complains of head pain, right elbow and bilateral knee pain.

## 2022-08-17 NOTE — ED Provider Triage Note (Signed)
Emergency Medicine Provider Triage Evaluation Note  Amber Hicks , a 71 y.o. female  was evaluated in triage.  Pt complains of fall.  Patient states that she was in the [employer] parking lot earlier when she tripped and lost her balance falling striking her face on the concrete.  She is currently complaining of facial pain, bilateral knee pain and right elbow pain.  She has been able to ambulate since the incident.  Denies loss of consciousness or blood thinner use.  Denies visual disturbance, gait abnormalities, weakness/sensory deficits in upper or lower extremities, slurred speech, facial droop.  Denies fever, chills, night sweats, dizziness, lightheadedness.  Review of Systems  Positive: See above Negative:   Physical Exam  BP (!) 106/92 (BP Location: Right Arm)   Pulse 84   Temp 98.2 F (36.8 C) (Oral)   Resp 18   Ht '5\' 7"'$  (1.702 m)   Wt 74.8 kg   SpO2 99%   BMI 25.84 kg/m  Gen:   Awake, no distress   Resp:  Normal effort  MSK:   Moves extremities without difficulty  Other:  Tender palpation right elbow as well as bilateral knee.  Patient has full range of motion of bilateral upper and lower extremities.  Cranial 5012 grossly intact.  Medical Decision Making  Medically screening exam initiated at 4:45 PM.  Appropriate orders placed.  Amber Hicks was informed that the remainder of the evaluation will be completed by another provider, this initial triage assessment does not replace that evaluation, and the importance of remaining in the ED until their evaluation is complete.     Wilnette Kales, [place] 08/17/22 1718

## 2022-09-15 ENCOUNTER — Ambulatory Visit
Admission: RE | Admit: 2022-09-15 | Discharge: 2022-09-15 | Disposition: A | Discharge status: Home / Self Care | Payer: Medicare Other | Source: Ambulatory Visit | Attending: Internal Medicine | Admitting: Internal Medicine

## 2022-09-15 ENCOUNTER — Ambulatory Visit: Payer: Medicare Other

## 2022-09-15 DIAGNOSIS — Z1231 Encounter for screening mammogram for malignant neoplasm of breast: Secondary | ICD-10-CM | POA: Diagnosis not present

## 2022-09-23 ENCOUNTER — Emergency Department (HOSPITAL_COMMUNITY)
Admission: EM | Admit: 2022-09-23 | Discharge: 2022-09-24 | Disposition: A | Discharge status: Home / Self Care | Payer: Medicare Other | Attending: Emergency Medicine | Admitting: Emergency Medicine

## 2022-09-23 ENCOUNTER — Emergency Department (HOSPITAL_COMMUNITY): Payer: Medicare Other

## 2022-09-23 ENCOUNTER — Other Ambulatory Visit: Payer: Self-pay

## 2022-09-23 ENCOUNTER — Encounter (HOSPITAL_COMMUNITY): Payer: Self-pay

## 2022-09-23 DIAGNOSIS — R0602 Shortness of breath: Secondary | ICD-10-CM | POA: Insufficient documentation

## 2022-09-23 DIAGNOSIS — R0789 Other chest pain: Secondary | ICD-10-CM | POA: Insufficient documentation

## 2022-09-23 DIAGNOSIS — R079 Chest pain, unspecified: Secondary | ICD-10-CM | POA: Diagnosis not present

## 2022-09-23 LAB — CBC WITH DIFFERENTIAL/PLATELET
Abs Immature Granulocytes: 0.02 10*3/uL (ref 0.00–0.07)
Basophils Absolute: 0 10*3/uL (ref 0.0–0.1)
Basophils Relative: 1 %
Eosinophils Absolute: 0.3 10*3/uL (ref 0.0–0.5)
Eosinophils Relative: 4 %
HCT: 41.2 % (ref 36.0–46.0)
Hemoglobin: 13.4 g/dL (ref 12.0–15.0)
Immature Granulocytes: 0 %
Lymphocytes Relative: 38 %
Lymphs Abs: 3 10*3/uL (ref 0.7–4.0)
MCH: 30.6 pg (ref 26.0–34.0)
MCHC: 32.5 g/dL (ref 30.0–36.0)
MCV: 94.1 fL (ref 80.0–100.0)
Monocytes Absolute: 0.6 10*3/uL (ref 0.1–1.0)
Monocytes Relative: 7 %
Neutro Abs: 3.9 10*3/uL (ref 1.7–7.7)
Neutrophils Relative %: 50 %
Platelets: 266 10*3/uL (ref 150–400)
RBC: 4.38 MIL/uL (ref 3.87–5.11)
RDW: 13.7 % (ref 11.5–15.5)
WBC: 7.8 10*3/uL (ref 4.0–10.5)
nRBC: 0 % (ref 0.0–0.2)

## 2022-09-23 LAB — BASIC METABOLIC PANEL
Anion gap: 7 (ref 5–15)
BUN: 15 mg/dL (ref 8–23)
CO2: 25 mmol/L (ref 22–32)
Calcium: 9.2 mg/dL (ref 8.9–10.3)
Chloride: 109 mmol/L (ref 98–111)
Creatinine, Ser: 0.78 mg/dL (ref 0.44–1.00)
GFR, Estimated: >60 mL/min (ref >60)
Glucose, Bld: 92 mg/dL (ref 70–99)
Potassium: 3.7 mmol/L (ref 3.5–5.1)
Sodium: 141 mmol/L (ref 135–145)

## 2022-09-23 LAB — TROPONIN I (HIGH SENSITIVITY): Troponin I (High Sensitivity): 8 ng/L (ref <18)

## 2022-09-24 ENCOUNTER — Emergency Department (HOSPITAL_COMMUNITY): Payer: Medicare Other

## 2022-09-24 ENCOUNTER — Other Ambulatory Visit: Payer: Self-pay

## 2022-09-24 DIAGNOSIS — R0602 Shortness of breath: Secondary | ICD-10-CM | POA: Diagnosis not present

## 2022-09-24 DIAGNOSIS — R079 Chest pain, unspecified: Secondary | ICD-10-CM | POA: Diagnosis not present

## 2022-09-24 LAB — TROPONIN I (HIGH SENSITIVITY): Troponin I (High Sensitivity): 8 ng/L (ref <18)

## 2022-09-24 MED ORDER — ACETAMINOPHEN 500 MG PO TABS
1000.0000 mg | ORAL_TABLET | Freq: Once | ORAL | Status: AC
  Administered 2022-09-24: 1000 mg via ORAL
  Filled 2022-09-24: qty 2

## 2022-09-24 MED ORDER — IOHEXOL 350 MG/ML SOLN
75.0000 mL | Freq: Once | INTRAVENOUS | Status: AC | PRN
  Administered 2022-09-24: 75 mL via INTRAVENOUS

## 2022-09-24 NOTE — ED Provider Notes (Signed)
[facility] Provider Note  CSN: [id number] Arrival date & time: 09/23/22 1559  Chief Complaint(s) Chest Pain and Shortness of Breath  HPI [facility] is a 71 y.o. female with a history of hyperlipidemia presenting to the emergency department chest pain.  She reports chest pain began yesterday, pleuritic, located in the left chest.  She reports occasional shortness of breath.  No cough, fevers, chills.  Pain does not radiate.  No diaphoresis, nausea, vomiting.  No abdominal pain.  No similar episodes before.  No leg pain or leg swelling.  No recent travel or surgeries.   Past Medical History Past Medical History:  Diagnosis Date   Depression 01/22/2012   GERD without esophagitis 12/10/2020   Hx of adenomatous colonic polyps 01/26/2015   Hyperlipidemia 01/24/2012   Mixed hyperlipidemia 01/24/2012   VITAMIN D DEFICIENCY 07/09/2010   Qualifier: Diagnosis of  By: Jenny Reichmann MD, Hunt Oris    Patient Active Problem List   Diagnosis Date Noted   Acute pain of left wrist 07/17/2021   Hypokalemia 07/17/2021   SIRS (systemic inflammatory response syndrome) ([place]) 07/17/2021   Preop exam for internal medicine 01/15/2021   Osteoporosis 12/10/2020   Aortic atherosclerosis ([place]) 12/10/2020   GERD without esophagitis 12/10/2020   Right otitis externa 04/19/2020   Rash 04/19/2020   External hemorrhoids without complication 29/51/8841   Left lateral epicondylitis 12/23/2019   Medial epicondylitis of elbow, left 12/23/2019   Abdominal pain 08/10/2018   Chronic pain of right ankle 06/16/2018   Hx of adenomatous colonic polyps 01/26/2015   Headaches due to old head injury 09/04/2014   Vertigo 08/17/2014   Hidradenitis 04/05/2013   Mixed hyperlipidemia 01/24/2012   Depression 01/22/2012   Encounter for preventative adult health care exam with abnormal findings 01/15/2012   Vitamin D deficiency 07/09/2010   Home Medication(s) Prior to Admission medications    Medication Sig Start Date End Date Taking? Authorizing Provider  acetaminophen (TYLENOL) 325 MG tablet Take 650 mg by mouth every 6 (six) hours as needed for mild pain.    [provider]  amoxicillin-clavulanate (AUGMENTIN) 875-125 MG tablet Take 1 tablet by mouth every 12 (twelve) hours. 03/26/22   Domenic Moras, PA-C  diclofenac Sodium (VOLTAREN) 1 % GEL Apply 2 g topically 4 (four) times daily. Patient taking differently: Apply 2 g topically daily as needed. 07/19/21   Ghimire, Henreitta Leber, MD  lidocaine-prilocaine (EMLA) cream Apply 1 Application topically as needed. Apply to left buttock and cover with saran wrap 1 hour as needed for pain 03/26/22   Domenic Moras, PA-C  LORazepam (ATIVAN) 0.5 MG tablet 1-2 tabs 30 - 60 min prior to shot. Do not drive with this medicine. Patient taking differently: Take 0.5 mg by mouth every 6 (six) hours as needed for anxiety. 09/08/21   Gregor Hams, MD  naproxen (NAPROSYN) 500 MG tablet Take 1 tablet (500 mg total) by mouth 2 (two) times daily as needed. 03/26/22   Domenic Moras, PA-C  omeprazole (PRILOSEC) 40 MG capsule Take 40 mg by mouth daily.    [provider]  rosuvastatin (CRESTOR) 20 MG tablet Take 1 tablet (20 mg total) by mouth daily. 01/15/21   Biagio Borg, MD  traMADol (ULTRAM) 50 MG tablet Take 1 tablet (50 mg total) by mouth every 6 (six) hours as needed for moderate pain. 10/09/21   Fredia Sorrow, MD  Past Surgical History Past Surgical History:  Procedure Laterality Date   ceaserian section     1 time   Family History Family History  Problem Relation Age of Onset   Heart disease Mother    Heart disease Brother    Colon cancer Neg Hx     Social History Social History   Tobacco Use   Smoking status: Never   Smokeless tobacco: Never  Substance Use Topics   Alcohol use: No     Alcohol/week: 0.0 standard drinks of alcohol   Drug use: No   Allergies Doxycycline  Review of Systems Review of Systems  All other systems reviewed and are negative.   Physical Exam Vital Signs  I have reviewed the triage vital signs BP 131/87   Pulse 72   Temp 97.7 F (36.5 C)   Resp 14   SpO2 95%  Physical Exam Vitals and nursing note reviewed.  Constitutional:      General: She is not in acute distress.    Appearance: She is well-developed.  HENT:     Head: Normocephalic and atraumatic.     Mouth/Throat:     Mouth: Mucous membranes are moist.  Eyes:     Pupils: Pupils are equal, round, and reactive to light.  Cardiovascular:     Rate and Rhythm: Normal rate and regular rhythm.     Heart sounds: No murmur heard. Pulmonary:     Effort: Pulmonary effort is normal. No respiratory distress.     Breath sounds: Normal breath sounds.  Abdominal:     General: Abdomen is flat.     Palpations: Abdomen is soft.     Tenderness: There is no abdominal tenderness.  Musculoskeletal:        General: No tenderness.     Right lower leg: No edema.     Left lower leg: No edema.  Skin:    General: Skin is warm and dry.  Neurological:     General: No focal deficit present.     Mental Status: She is alert. Mental status is at baseline.  Psychiatric:        Mood and Affect: Mood normal.        Behavior: Behavior normal.     ED Results and Treatments Labs (all labs ordered are listed, but only abnormal results are displayed) Labs Reviewed  BASIC METABOLIC PANEL  CBC WITH DIFFERENTIAL/PLATELET  TROPONIN I (HIGH SENSITIVITY)  TROPONIN I (HIGH SENSITIVITY)                                                                                                                          Radiology CT Angio Chest PE W and/or Wo Contrast  Result Date: 09/24/2022 CLINICAL DATA:  Chest pain and shortness of breath EXAM: CT ANGIOGRAPHY CHEST WITH CONTRAST TECHNIQUE: Multidetector CT imaging  of the chest was performed using the standard protocol during bolus administration of intravenous contrast. Multiplanar CT image reconstructions and MIPs were obtained to evaluate  the vascular anatomy. RADIATION DOSE REDUCTION: This exam was performed according to the departmental dose-optimization program which includes automated exposure control, adjustment of the mA and/or kV according to patient size and/or use of iterative reconstruction technique. CONTRAST:  14m OMNIPAQUE IOHEXOL 350 MG/ML SOLN COMPARISON:  None Available. FINDINGS: Cardiovascular: The study is high quality for the evaluation of pulmonary embolism. There are no filling defects in the central, lobar, segmental or subsegmental pulmonary artery branches to suggest acute pulmonary embolism. Great vessels are normal in course and caliber. Normal heart size. No significant pericardial fluid/thickening. Coronary artery calcifications and aortic atherosclerosis. Mediastinum/Nodes: Normal esophagus. No pathologically enlarged axillary, supraclavicular, mediastinal, or hilar lymph nodes. Lungs/Pleura: The central airways are patent. Mild diffuse bronchial wall thickening with subsegmental mucous plugging in the lower lobes associated with mosaic attenuation. No focal consolidation. Right upper lobe calcified granuloma. No pneumothorax. No pleural effusion. Upper abdomen: Normal. Musculoskeletal: No acute or abnormal lytic or blastic osseous lesions. Subchondral cysts in the partially imaged left humeral head. Review of the MIP images confirms the above findings. IMPRESSION: 1. No pulmonary embolism. 2. Mild diffuse bronchial wall thickening with subsegmental mucous plugging in the lower lobes associated with mosaic attenuation, suggestive of small airways disease. 3. Aortic Atherosclerosis (ICD10-I70.0). Electronically Signed   By: LDarrin NipperM.D.   On: 09/24/2022 12:50   DG Chest 2 View  Result Date: 09/23/2022 CLINICAL DATA:  Dog bite, chest pain  EXAM: CHEST - 2 VIEW COMPARISON:  10/09/2021 FINDINGS: The heart size and mediastinal contours are within normal limits. Both lungs are clear. The visualized skeletal structures are unremarkable. IMPRESSION: No active cardiopulmonary disease. Electronically Signed   By: MRanda NgoM.D.   On: 09/23/2022 19:31    Pertinent labs & imaging results that were available during my care of the patient were reviewed by me and considered in my medical decision making (see MDM for details).  Medications Ordered in ED Medications  acetaminophen (TYLENOL) tablet 1,000 mg (1,000 mg Oral Given 09/24/22 1005)  iohexol (OMNIPAQUE) 350 MG/ML injection 75 mL (75 mLs Intravenous Contrast Given 09/24/22 1230)                                                                                                                                     Procedures Procedures  (including critical care time)  Medical Decision Making / ED Course   MDM:  71year old female presenting to the emergency department with chest pain.  Patient overall very well-appearing, physical exam reassuring with no focal abnormality.  EKG is reassuring.  Given pleuritic nature of pain, will obtain CT scan to evaluate for pulmonary embolism.  Chest x-ray without evidence of pneumothorax or pneumonia.  Symptoms atypical for ACS, not exertional, EKG is reassuring and delta troponin is negative.  Doubt dissection without radiation of the pain.  Doubt esophageal pathology without nausea or vomiting.  Will follow-up CT scan, if CT scan is  negative, likely discharge with outpatient follow-up with PMD.    Clinical Course as of 09/24/22 1352  Thu Sep 24, 2022  1349 CTA negative for PE.  On further history patient does endorse some reproducibility to her pain with palpation.  Shows some lower small airway disease with no evidence of pneumonia.  Patient feels better after Tylenol.  Low concern for occult ACS.  Advise follow-up with primary doctor.  Will discharge patient to home. All questions answered. Patient comfortable with plan of discharge. Return precautions discussed with patient and specified on the after visit summary.  [WS]    Clinical Course User Index [WS] Cristie Hem, MD     Additional history obtained:  -External records from outside source obtained and reviewed including: Chart review including previous notes, labs, imaging, consultation notes including 09/24/22   Lab Tests: -I ordered, reviewed, and interpreted labs.   The pertinent results include:   Labs Reviewed  BASIC METABOLIC PANEL  CBC WITH DIFFERENTIAL/PLATELET  TROPONIN I (HIGH SENSITIVITY)  TROPONIN I (HIGH SENSITIVITY)    Notable for all normal results. Troponin   EKG   EKG Interpretation  Date/Time:  Wednesday September 23 2022 17:40:37 EST Ventricular Rate:  92 PR Interval:  134 QRS Duration: 74 QT Interval:  336 QTC Calculation: 415 R Axis:   78 Text Interpretation: Normal sinus rhythm Normal ECG When compared with ECG of 09-Oct-2021 16:35, PREVIOUS ECG IS PRESENT Confirmed by Lennice Sites ([id number]) on 09/24/2022 9:05:27 AM         Imaging Studies ordered: I ordered imaging studies including CTA chest On my interpretation imaging demonstrates small airways disease, no PE I independently visualized and interpreted imaging. I agree with the radiologist interpretation   Medicines ordered and prescription drug management: Meds ordered this encounter  Medications   acetaminophen (TYLENOL) tablet 1,000 mg   iohexol (OMNIPAQUE) 350 MG/ML injection 75 mL    -I have reviewed the patients home medicines and have made adjustments as needed    Reevaluation: After the interventions noted above, I reevaluated the patient and found that they have improved  Co morbidities that complicate the patient evaluation  Past Medical History:  Diagnosis Date   Depression 01/22/2012   GERD without esophagitis 12/10/2020   Hx of  adenomatous colonic polyps 01/26/2015   Hyperlipidemia 01/24/2012   Mixed hyperlipidemia 01/24/2012   VITAMIN D DEFICIENCY 07/09/2010   Qualifier: Diagnosis of  By: Jenny Reichmann MD, Hunt Oris       Dispostion: Disposition decision including need for hospitalization was considered, and patient discharged from emergency department.    Final Clinical Impression(s) / ED Diagnoses Final diagnoses:  Atypical chest pain     This chart was dictated using voice recognition software.  Despite best efforts to proofread,  errors can occur which can change the documentation meaning.    Cristie Hem, MD 09/24/22 1352

## 2022-09-24 NOTE — Discharge Instructions (Addendum)
We evaluated you for your chest pain.  We did not see any signs of heart attack and your CT scan did not show any signs of a blood clot or pneumonia.  Since your testing is overall reassuring, please follow-up closely with your primary doctor.  Your CT scan showed some lung changes which may reflect early emphysema or bronchitis.  Please take Tylenol and Motrin for your symptoms at home.  You can take 650 mg of Tylenol every 6 hours and 600 mg of ibuprofen every 6 hours as needed for your symptoms.  You can take these medicines together as needed, either at the same time, or alternating every 3 hours.  Please return to the emergency department if any your symptoms worsen or you develop severe chest pain, difficulty breathing, fevers, productive cough, fainting, or any other concerning symptoms.

## 2022-09-24 NOTE — ED Provider Triage Note (Signed)
Emergency Medicine Provider Triage Evaluation Note  [facility] , a 71 y.o. female  was evaluated in triage.  Pt complains of pain.  Intermittent since yesterday.  Goes into her right arm.  No numbness or weakness.  No back pain.  No pain or swelling to lower legs.  Typically able to perform ADLs without DOE or exertional chest pain.  Review of Systems  Positive: CP Negative: Fever, cough, LE swelling  Physical Exam  BP (!) 146/64   Pulse 88   Temp 98.5 F (36.9 C)   Resp 14   SpO2 97%  Gen:   Awake, no distress   Resp:  Normal effort  MSK:   Moves extremities without difficulty  Other:    Medical Decision Making  Medically screening exam initiated at 3:15 AM.  Appropriate orders placed.  [facility] was informed that the remainder of the evaluation will be completed by another provider, this initial triage assessment does not replace that evaluation, and the importance of remaining in the ED until their evaluation is complete.  CP   Korrie Hofbauer A, PA-C 09/24/22 0316

## 2022-11-04 DIAGNOSIS — R7303 Prediabetes: Secondary | ICD-10-CM | POA: Diagnosis not present

## 2022-11-04 DIAGNOSIS — B351 Tinea unguium: Secondary | ICD-10-CM | POA: Diagnosis not present

## 2022-11-04 DIAGNOSIS — Z130 Encounter for screening for diseases of the blood and blood-forming organs and certain disorders involving the immune mechanism: Secondary | ICD-10-CM | POA: Diagnosis not present

## 2022-11-04 DIAGNOSIS — E559 Vitamin D deficiency, unspecified: Secondary | ICD-10-CM | POA: Diagnosis not present

## 2022-11-04 DIAGNOSIS — Z9181 History of falling: Secondary | ICD-10-CM | POA: Diagnosis not present

## 2022-11-04 DIAGNOSIS — G8929 Other chronic pain: Secondary | ICD-10-CM | POA: Diagnosis not present

## 2022-11-04 DIAGNOSIS — E7800 Pure hypercholesterolemia, unspecified: Secondary | ICD-10-CM | POA: Diagnosis not present

## 2022-11-04 DIAGNOSIS — Z1159 Encounter for screening for other viral diseases: Secondary | ICD-10-CM | POA: Diagnosis not present

## 2022-11-04 DIAGNOSIS — K219 Gastro-esophageal reflux disease without esophagitis: Secondary | ICD-10-CM | POA: Diagnosis not present

## 2022-11-04 DIAGNOSIS — I700 Atherosclerosis of aorta: Secondary | ICD-10-CM | POA: Diagnosis not present

## 2022-11-04 DIAGNOSIS — Z0000 Encounter for general adult medical examination without abnormal findings: Secondary | ICD-10-CM | POA: Diagnosis not present

## 2022-11-04 DIAGNOSIS — B028 Zoster with other complications: Secondary | ICD-10-CM | POA: Diagnosis not present

## 2022-11-04 DIAGNOSIS — R296 Repeated falls: Secondary | ICD-10-CM | POA: Diagnosis not present

## 2022-11-05 ENCOUNTER — Other Ambulatory Visit: Payer: Self-pay | Admitting: Internal Medicine

## 2022-11-05 DIAGNOSIS — Z13820 Encounter for screening for osteoporosis: Secondary | ICD-10-CM

## 2022-11-06 DIAGNOSIS — G8929 Other chronic pain: Secondary | ICD-10-CM | POA: Diagnosis not present

## 2022-11-06 DIAGNOSIS — K219 Gastro-esophageal reflux disease without esophagitis: Secondary | ICD-10-CM | POA: Diagnosis not present

## 2022-11-06 DIAGNOSIS — B351 Tinea unguium: Secondary | ICD-10-CM | POA: Diagnosis not present

## 2022-11-06 DIAGNOSIS — E559 Vitamin D deficiency, unspecified: Secondary | ICD-10-CM | POA: Diagnosis not present

## 2022-11-06 DIAGNOSIS — I700 Atherosclerosis of aorta: Secondary | ICD-10-CM | POA: Diagnosis not present

## 2022-11-06 DIAGNOSIS — R296 Repeated falls: Secondary | ICD-10-CM | POA: Diagnosis not present

## 2022-11-06 DIAGNOSIS — M25571 Pain in right ankle and joints of right foot: Secondary | ICD-10-CM | POA: Diagnosis not present

## 2022-11-06 DIAGNOSIS — Z9181 History of falling: Secondary | ICD-10-CM | POA: Diagnosis not present

## 2022-11-06 DIAGNOSIS — E7800 Pure hypercholesterolemia, unspecified: Secondary | ICD-10-CM | POA: Diagnosis not present

## 2022-11-06 DIAGNOSIS — B029 Zoster without complications: Secondary | ICD-10-CM | POA: Diagnosis not present

## 2022-11-06 DIAGNOSIS — R7303 Prediabetes: Secondary | ICD-10-CM | POA: Diagnosis not present

## 2022-11-09 ENCOUNTER — Ambulatory Visit (INDEPENDENT_AMBULATORY_CARE_PROVIDER_SITE_OTHER): Payer: Medicare Other

## 2022-11-09 ENCOUNTER — Encounter: Payer: Self-pay | Admitting: Podiatry

## 2022-11-09 ENCOUNTER — Ambulatory Visit: Payer: Medicare Other | Admitting: Podiatry

## 2022-11-09 DIAGNOSIS — L603 Nail dystrophy: Secondary | ICD-10-CM

## 2022-11-09 DIAGNOSIS — M792 Neuralgia and neuritis, unspecified: Secondary | ICD-10-CM

## 2022-11-09 MED ORDER — LIDOCAINE-PRILOCAINE 2.5-2.5 % EX CREA: 1.0000 | TOPICAL_CREAM | CUTANEOUS | 0 refills | Status: AC | PRN

## 2022-11-09 NOTE — Progress Notes (Signed)
  Subjective:  Patient ID: [facility], female    DOB: 1950-12-11,   MRN: [id number]  Chief Complaint  Patient presents with   Nail Problem    Patient states that her toe is tender to the touch. Patient states that her left great toe has been hurting her for about two or 3 weeks.     72 y.o. female presents for concern of left great toe pain that has been going on for about 3 weeks. Relates tender to the touch. She has had issues with the nail before and relates she has extreme sensitivity in the toe. Has been using lidocaine cream on the toe.  . Denies any other pedal complaints. Denies n/v/f/c.   Past Medical History:  Diagnosis Date   Depression 01/22/2012   GERD without esophagitis 12/10/2020   Hx of adenomatous colonic polyps 01/26/2015   Hyperlipidemia 01/24/2012   Mixed hyperlipidemia 01/24/2012   VITAMIN D DEFICIENCY 07/09/2010   Qualifier: Diagnosis of  By: Jenny Reichmann MD, Hunt Oris     Objective:  Physical Exam: Vascular: DP/PT pulses 2/4 bilateral. CFT <3 seconds. Normal hair growth on digits. No edema.  Skin. No lacerations or abrasions bilateral feet.  Musculoskeletal: MMT 5/5 bilateral lower extremities in DF, PF, Inversion and Eversion. Deceased ROM in DF of ankle joint.  Neurological: Sensation intact to light touch.   Assessment:   1. Onychodystrophy   2. Neuritis      Plan:  Patient was evaluated and treated and all questions answered. Discussed onychodystrophy and neuritis vs raidculopathy and etiology as well as treatment with patient.  Reviewed previous notes.  Radiographs reviewed and discussed with patient.  No acute fractures or dislocations.  -Discussed supportive shoes at all times and checking feet regularly.  -Patient refuses any procedure today as could not handle a needle.  Will continued with Lidocaine cream Prescription sent.  - Toe cap dispensed to help cushion the toe area.  -Patient to return as needed.    Lorenda Peck, DPM

## 2022-11-10 DIAGNOSIS — B029 Zoster without complications: Secondary | ICD-10-CM | POA: Diagnosis not present

## 2022-11-10 DIAGNOSIS — G8929 Other chronic pain: Secondary | ICD-10-CM | POA: Diagnosis not present

## 2022-11-10 DIAGNOSIS — I700 Atherosclerosis of aorta: Secondary | ICD-10-CM | POA: Diagnosis not present

## 2022-11-10 DIAGNOSIS — K219 Gastro-esophageal reflux disease without esophagitis: Secondary | ICD-10-CM | POA: Diagnosis not present

## 2022-11-10 DIAGNOSIS — E7800 Pure hypercholesterolemia, unspecified: Secondary | ICD-10-CM | POA: Diagnosis not present

## 2022-11-10 DIAGNOSIS — B351 Tinea unguium: Secondary | ICD-10-CM | POA: Diagnosis not present

## 2022-11-10 DIAGNOSIS — E559 Vitamin D deficiency, unspecified: Secondary | ICD-10-CM | POA: Diagnosis not present

## 2022-11-10 DIAGNOSIS — R296 Repeated falls: Secondary | ICD-10-CM | POA: Diagnosis not present

## 2022-11-10 DIAGNOSIS — R7303 Prediabetes: Secondary | ICD-10-CM | POA: Diagnosis not present

## 2022-11-10 DIAGNOSIS — Z9181 History of falling: Secondary | ICD-10-CM | POA: Diagnosis not present

## 2022-11-10 DIAGNOSIS — M25571 Pain in right ankle and joints of right foot: Secondary | ICD-10-CM | POA: Diagnosis not present

## 2022-11-12 DIAGNOSIS — R296 Repeated falls: Secondary | ICD-10-CM | POA: Diagnosis not present

## 2022-11-12 DIAGNOSIS — E559 Vitamin D deficiency, unspecified: Secondary | ICD-10-CM | POA: Diagnosis not present

## 2022-11-12 DIAGNOSIS — R7303 Prediabetes: Secondary | ICD-10-CM | POA: Diagnosis not present

## 2022-11-12 DIAGNOSIS — M25571 Pain in right ankle and joints of right foot: Secondary | ICD-10-CM | POA: Diagnosis not present

## 2022-11-12 DIAGNOSIS — E7800 Pure hypercholesterolemia, unspecified: Secondary | ICD-10-CM | POA: Diagnosis not present

## 2022-11-12 DIAGNOSIS — B351 Tinea unguium: Secondary | ICD-10-CM | POA: Diagnosis not present

## 2022-11-12 DIAGNOSIS — B029 Zoster without complications: Secondary | ICD-10-CM | POA: Diagnosis not present

## 2022-11-12 DIAGNOSIS — K219 Gastro-esophageal reflux disease without esophagitis: Secondary | ICD-10-CM | POA: Diagnosis not present

## 2022-11-12 DIAGNOSIS — I700 Atherosclerosis of aorta: Secondary | ICD-10-CM | POA: Diagnosis not present

## 2022-11-12 DIAGNOSIS — G8929 Other chronic pain: Secondary | ICD-10-CM | POA: Diagnosis not present

## 2022-11-12 DIAGNOSIS — Z9181 History of falling: Secondary | ICD-10-CM | POA: Diagnosis not present

## 2022-11-16 DIAGNOSIS — B029 Zoster without complications: Secondary | ICD-10-CM | POA: Diagnosis not present

## 2022-11-16 DIAGNOSIS — Z9181 History of falling: Secondary | ICD-10-CM | POA: Diagnosis not present

## 2022-11-16 DIAGNOSIS — G8929 Other chronic pain: Secondary | ICD-10-CM | POA: Diagnosis not present

## 2022-11-16 DIAGNOSIS — I700 Atherosclerosis of aorta: Secondary | ICD-10-CM | POA: Diagnosis not present

## 2022-11-16 DIAGNOSIS — B351 Tinea unguium: Secondary | ICD-10-CM | POA: Diagnosis not present

## 2022-11-16 DIAGNOSIS — R7303 Prediabetes: Secondary | ICD-10-CM | POA: Diagnosis not present

## 2022-11-16 DIAGNOSIS — E559 Vitamin D deficiency, unspecified: Secondary | ICD-10-CM | POA: Diagnosis not present

## 2022-11-16 DIAGNOSIS — R296 Repeated falls: Secondary | ICD-10-CM | POA: Diagnosis not present

## 2022-11-16 DIAGNOSIS — M25571 Pain in right ankle and joints of right foot: Secondary | ICD-10-CM | POA: Diagnosis not present

## 2022-11-16 DIAGNOSIS — E7800 Pure hypercholesterolemia, unspecified: Secondary | ICD-10-CM | POA: Diagnosis not present

## 2022-11-16 DIAGNOSIS — K219 Gastro-esophageal reflux disease without esophagitis: Secondary | ICD-10-CM | POA: Diagnosis not present

## 2022-11-26 DIAGNOSIS — M25571 Pain in right ankle and joints of right foot: Secondary | ICD-10-CM | POA: Diagnosis not present

## 2022-11-26 DIAGNOSIS — G8929 Other chronic pain: Secondary | ICD-10-CM | POA: Diagnosis not present

## 2022-11-26 DIAGNOSIS — I700 Atherosclerosis of aorta: Secondary | ICD-10-CM | POA: Diagnosis not present

## 2022-11-26 DIAGNOSIS — B351 Tinea unguium: Secondary | ICD-10-CM | POA: Diagnosis not present

## 2022-11-26 DIAGNOSIS — B029 Zoster without complications: Secondary | ICD-10-CM | POA: Diagnosis not present

## 2022-11-26 DIAGNOSIS — E559 Vitamin D deficiency, unspecified: Secondary | ICD-10-CM | POA: Diagnosis not present

## 2022-11-26 DIAGNOSIS — K219 Gastro-esophageal reflux disease without esophagitis: Secondary | ICD-10-CM | POA: Diagnosis not present

## 2022-11-26 DIAGNOSIS — R7303 Prediabetes: Secondary | ICD-10-CM | POA: Diagnosis not present

## 2022-11-26 DIAGNOSIS — R296 Repeated falls: Secondary | ICD-10-CM | POA: Diagnosis not present

## 2022-11-26 DIAGNOSIS — E7800 Pure hypercholesterolemia, unspecified: Secondary | ICD-10-CM | POA: Diagnosis not present

## 2022-11-26 DIAGNOSIS — Z9181 History of falling: Secondary | ICD-10-CM | POA: Diagnosis not present

## 2022-11-29 DIAGNOSIS — E119 Type 2 diabetes mellitus without complications: Secondary | ICD-10-CM | POA: Diagnosis not present

## 2022-11-29 DIAGNOSIS — M545 Low back pain, unspecified: Secondary | ICD-10-CM | POA: Diagnosis not present

## 2022-11-29 DIAGNOSIS — W450XXA Nail entering through skin, initial encounter: Secondary | ICD-10-CM | POA: Diagnosis not present

## 2022-11-29 DIAGNOSIS — M25552 Pain in left hip: Secondary | ICD-10-CM | POA: Diagnosis not present

## 2022-11-29 DIAGNOSIS — Z23 Encounter for immunization: Secondary | ICD-10-CM | POA: Diagnosis not present

## 2022-11-29 DIAGNOSIS — S99922A Unspecified injury of left foot, initial encounter: Secondary | ICD-10-CM | POA: Diagnosis not present

## 2022-11-30 DIAGNOSIS — B029 Zoster without complications: Secondary | ICD-10-CM | POA: Diagnosis not present

## 2022-11-30 DIAGNOSIS — E7800 Pure hypercholesterolemia, unspecified: Secondary | ICD-10-CM | POA: Diagnosis not present

## 2022-11-30 DIAGNOSIS — K219 Gastro-esophageal reflux disease without esophagitis: Secondary | ICD-10-CM | POA: Diagnosis not present

## 2022-11-30 DIAGNOSIS — R7303 Prediabetes: Secondary | ICD-10-CM | POA: Diagnosis not present

## 2022-11-30 DIAGNOSIS — I700 Atherosclerosis of aorta: Secondary | ICD-10-CM | POA: Diagnosis not present

## 2022-11-30 DIAGNOSIS — B351 Tinea unguium: Secondary | ICD-10-CM | POA: Diagnosis not present

## 2022-11-30 DIAGNOSIS — M25571 Pain in right ankle and joints of right foot: Secondary | ICD-10-CM | POA: Diagnosis not present

## 2022-11-30 DIAGNOSIS — Z9181 History of falling: Secondary | ICD-10-CM | POA: Diagnosis not present

## 2022-11-30 DIAGNOSIS — G8929 Other chronic pain: Secondary | ICD-10-CM | POA: Diagnosis not present

## 2022-11-30 DIAGNOSIS — E559 Vitamin D deficiency, unspecified: Secondary | ICD-10-CM | POA: Diagnosis not present

## 2022-11-30 DIAGNOSIS — R296 Repeated falls: Secondary | ICD-10-CM | POA: Diagnosis not present

## 2022-12-01 DIAGNOSIS — W450XXD Nail entering through skin, subsequent encounter: Secondary | ICD-10-CM | POA: Diagnosis not present

## 2022-12-01 DIAGNOSIS — M79605 Pain in left leg: Secondary | ICD-10-CM | POA: Diagnosis not present

## 2022-12-01 DIAGNOSIS — E119 Type 2 diabetes mellitus without complications: Secondary | ICD-10-CM | POA: Diagnosis not present

## 2022-12-09 DIAGNOSIS — Z23 Encounter for immunization: Secondary | ICD-10-CM | POA: Diagnosis not present

## 2022-12-09 DIAGNOSIS — E119 Type 2 diabetes mellitus without complications: Secondary | ICD-10-CM | POA: Diagnosis not present

## 2022-12-09 DIAGNOSIS — S91312A Laceration without foreign body, left foot, initial encounter: Secondary | ICD-10-CM | POA: Diagnosis not present

## 2023-02-18 DIAGNOSIS — W57XXXA Bitten or stung by nonvenomous insect and other nonvenomous arthropods, initial encounter: Secondary | ICD-10-CM | POA: Diagnosis not present

## 2023-02-18 DIAGNOSIS — S80862A Insect bite (nonvenomous), left lower leg, initial encounter: Secondary | ICD-10-CM | POA: Diagnosis not present

## 2023-03-17 ENCOUNTER — Emergency Department (HOSPITAL_COMMUNITY)
Admission: EM | Admit: 2023-03-17 | Discharge: 2023-03-17 | Disposition: A | Discharge status: Home / Self Care | Payer: Medicare Other | Attending: Emergency Medicine | Admitting: Emergency Medicine

## 2023-03-17 ENCOUNTER — Encounter (HOSPITAL_COMMUNITY): Payer: Self-pay

## 2023-03-17 ENCOUNTER — Other Ambulatory Visit: Payer: Self-pay

## 2023-03-17 ENCOUNTER — Emergency Department (HOSPITAL_COMMUNITY): Payer: Medicare Other

## 2023-03-17 DIAGNOSIS — M25552 Pain in left hip: Secondary | ICD-10-CM | POA: Insufficient documentation

## 2023-03-17 DIAGNOSIS — W19XXXA Unspecified fall, initial encounter: Secondary | ICD-10-CM

## 2023-03-17 DIAGNOSIS — S79912A Unspecified injury of left hip, initial encounter: Secondary | ICD-10-CM | POA: Diagnosis not present

## 2023-03-17 DIAGNOSIS — Z743 Need for continuous supervision: Secondary | ICD-10-CM | POA: Diagnosis not present

## 2023-03-17 DIAGNOSIS — R6889 Other general symptoms and signs: Secondary | ICD-10-CM | POA: Diagnosis not present

## 2023-03-17 DIAGNOSIS — W0110XA Fall on same level from slipping, tripping and stumbling with subsequent striking against unspecified object, initial encounter: Secondary | ICD-10-CM | POA: Diagnosis not present

## 2023-03-17 MED ORDER — OXYCODONE-ACETAMINOPHEN 5-325 MG PO TABS
1.0000 | ORAL_TABLET | Freq: Once | ORAL | Status: AC
  Administered 2023-03-17: 1 via ORAL
  Filled 2023-03-17: qty 1

## 2023-03-17 NOTE — ED Provider Notes (Signed)
[place] EMERGENCY DEPARTMENT AT [facility] Provider Note   CSN: [id number] Arrival date & time: 03/17/23  1152     History  Chief Complaint  Patient presents with   Hip Pain    A[facility] is a 72 y.o. female.  Patient with history of GERD, hyperlipidemia presents today with complaints of fall. She states that same occurred this morning when she sustained a mechanical fall and hit her left hip on the ground. She did not hit her head or loose consciousness. She is not anticoagulated. She has not tried to walk since the fall. Denies any other injuries or complaints.  The history is provided by the patient. No language interpreter was used.  Hip Pain       Home Medications Prior to Admission medications   Medication Sig Start Date End Date Taking? Authorizing Provider  acetaminophen (TYLENOL) 325 MG tablet Take 650 mg by mouth every 6 (six) hours as needed for mild pain.    [provider]  amoxicillin-clavulanate (AUGMENTIN) 875-125 MG tablet Take 1 tablet by mouth every 12 (twelve) hours. 03/26/22   Fayrene Helper, PA-C  diclofenac Sodium (VOLTAREN) 1 % GEL Apply 2 g topically 4 (four) times daily. Patient taking differently: Apply 2 g topically daily as needed. 07/19/21   Ghimire, Werner Lean, MD  lidocaine-prilocaine (EMLA) cream Apply 1 Application topically as needed. Apply to left buttock and cover with saran wrap 1 hour as needed for pain 03/26/22   Fayrene Helper, PA-C  LORazepam (ATIVAN) 0.5 MG tablet 1-2 tabs 30 - 60 min prior to shot. Do not drive with this medicine. Patient taking differently: Take 0.5 mg by mouth every 6 (six) hours as needed for anxiety. 09/08/21   Rodolph Bong, MD  naproxen (NAPROSYN) 500 MG tablet Take 1 tablet (500 mg total) by mouth 2 (two) times daily as needed. 03/26/22   Fayrene Helper, PA-C  omeprazole (PRILOSEC) 40 MG capsule Take 40 mg by mouth daily.    [provider]  rosuvastatin (CRESTOR) 20 MG tablet Take 1  tablet (20 mg total) by mouth daily. 01/15/21   Corwin Levins, MD  traMADol (ULTRAM) 50 MG tablet Take 1 tablet (50 mg total) by mouth every 6 (six) hours as needed for moderate pain. 10/09/21   Vanetta Mulders, MD  valACYclovir (VALTREX) 500 MG tablet Take 1,000 mg by mouth 3 (three) times daily. 11/05/22   [provider]      Allergies    Doxycycline    Review of Systems   Review of Systems  Musculoskeletal:  Positive for arthralgias.  All other systems reviewed and are negative.   Physical Exam Updated Vital Signs BP 118/67 (BP Location: Left Arm)   Pulse 64   Temp 98.5 F (36.9 C) (Oral)   Resp 16   SpO2 100%  Physical Exam Vitals and nursing note reviewed.  Constitutional:      General: She is not in acute distress.    Appearance: Normal appearance. She is normal weight. She is not ill-appearing, toxic-appearing or diaphoretic.  HENT:     Head: Normocephalic and atraumatic.  Cardiovascular:     Rate and Rhythm: Normal rate.  Pulmonary:     Effort: Pulmonary effort is normal. No respiratory distress.  Abdominal:     General: Abdomen is flat.     Palpations: Abdomen is soft.     Tenderness: There is no abdominal tenderness.  Musculoskeletal:        General:  Normal range of motion.     Cervical back: Normal range of motion.     Comments: TTP over the left lateral hip. No crepitus or deformity. No external rotation or shortening. DP and PT pulses intact and 2+. No overlying bruising or wounds  Skin:    General: Skin is warm and dry.  Neurological:     General: No focal deficit present.     Mental Status: She is alert.  Psychiatric:        Mood and Affect: Mood normal.        Behavior: Behavior normal.     ED Results / Procedures / Treatments   Labs (all labs ordered are listed, but only abnormal results are displayed) Labs Reviewed - No data to display  EKG None  Radiology DG Hip Unilat W or Wo Pelvis 2-3 Views Left  Result Date:  03/17/2023 CLINICAL DATA:  Trauma, fall, pain EXAM: DG HIP (WITH OR WITHOUT PELVIS) 2-3V LEFT COMPARISON:  08/08/2018 FINDINGS: No displaced fracture or dislocation is seen. Degenerative changes are noted in lower lumbar spine. IMPRESSION: No recent fracture or dislocation is seen in the pelvis and left hip. Electronically Signed   By: Ernie Avena M.D.   On: 03/17/2023 14:19    Procedures Procedures    Medications Ordered in ED Medications  oxyCODONE-acetaminophen (PERCOCET/ROXICET) 5-325 MG per tablet 1 tablet (has no administration in time range)    ED Course/ Medical Decision Making/ A&P                             Medical Decision Making Amount and/or Complexity of Data Reviewed Radiology: ordered.  Risk Prescription drug management.   Patient presents today with complaints of fall earlier today.  She is afebrile, nontoxic-appearing, and in no acute distress with reassuring vital signs.  Physical exam reveals TTP over the left lateral hip without deformity.  No external rotation or shortening.  X-ray imaging obtained which shows no acute findings.  I have personally reviewed and interpreted this imaging and agree with radiology interpretation.  Patient given Percocet with symptomatic improvement.  After imaging and medications, her symptoms have improved.  She is able to ambulate to the bathroom without assistance which is her baseline.  Therefore I do not think additional imaging to look for missed fracture is indicated. She is not anticoagulated. She is ready to go home.  Recommend RICE and Tylenol/ibuprofen with close PCP follow-up. Evaluation and diagnostic testing in the emergency department does not suggest an emergent condition requiring admission or immediate intervention beyond what has been performed at this time.  Plan for discharge with close PCP follow-up.  Patient is understanding and amenable with plan, educated on red flag symptoms that would prompt immediate  return.  Patient discharged in stable condition.   Final Clinical Impression(s) / ED Diagnoses Final diagnoses:  Fall, initial encounter  Left hip pain    Rx / DC Orders ED Discharge Orders     None     An After Visit Summary was printed and given to the patient.     Vear Clock 03/17/23 1508    Lonell Grandchild, MD 03/18/23 [phone]

## 2023-03-17 NOTE — ED Triage Notes (Signed)
Pt tripped and fell, landed on her L hip. C/o pain, no deformity noted, good ROM, just pain

## 2023-03-17 NOTE — Discharge Instructions (Signed)
As we discussed, your workup in the ER today was reassuring for acute findings.  X-ray imaging of your hip did not reveal any fracture or dislocation.  I recommend that you rest, ice, compress, and elevate areas that are painful and take Tylenol/ibuprofen as needed for pain.  Follow-up with your primary care doctor as needed.  Return if development of any new or worsening symptoms.

## 2023-03-26 DIAGNOSIS — S1096XA Insect bite of unspecified part of neck, initial encounter: Secondary | ICD-10-CM | POA: Diagnosis not present

## 2023-03-26 DIAGNOSIS — L299 Pruritus, unspecified: Secondary | ICD-10-CM | POA: Diagnosis not present

## 2023-03-28 ENCOUNTER — Encounter (HOSPITAL_COMMUNITY): Payer: Self-pay

## 2023-03-28 ENCOUNTER — Emergency Department (HOSPITAL_COMMUNITY): Payer: Medicare Other

## 2023-03-28 ENCOUNTER — Emergency Department (HOSPITAL_COMMUNITY)
Admission: EM | Admit: 2023-03-28 | Discharge: 2023-03-28 | Disposition: A | Discharge status: Home / Self Care | Payer: Medicare Other | Attending: Emergency Medicine | Admitting: Emergency Medicine

## 2023-03-28 ENCOUNTER — Other Ambulatory Visit: Payer: Self-pay

## 2023-03-28 DIAGNOSIS — M25552 Pain in left hip: Secondary | ICD-10-CM | POA: Diagnosis not present

## 2023-03-28 DIAGNOSIS — W1839XA Other fall on same level, initial encounter: Secondary | ICD-10-CM | POA: Insufficient documentation

## 2023-03-28 DIAGNOSIS — Z743 Need for continuous supervision: Secondary | ICD-10-CM | POA: Diagnosis not present

## 2023-03-28 DIAGNOSIS — Z043 Encounter for examination and observation following other accident: Secondary | ICD-10-CM | POA: Diagnosis not present

## 2023-03-28 DIAGNOSIS — M25551 Pain in right hip: Secondary | ICD-10-CM | POA: Diagnosis not present

## 2023-03-28 DIAGNOSIS — Z0389 Encounter for observation for other suspected diseases and conditions ruled out: Secondary | ICD-10-CM | POA: Diagnosis not present

## 2023-03-28 MED ORDER — HYDROCODONE-ACETAMINOPHEN 5-325 MG PO TABS
1.0000 | ORAL_TABLET | Freq: Once | ORAL | Status: AC
  Administered 2023-03-28: 1 via ORAL
  Filled 2023-03-28: qty 1

## 2023-03-28 NOTE — Discharge Instructions (Addendum)
Return to the ED with any new or worsening signs or symptoms Please follow-up with your PCP for reevaluation and further management of your left hip pain Please read the attached guide concerning hip pain Please begin taking ibuprofen every 6 hours as needed for pain.  He may also take Tylenol every 6 hours as needed for pain. Follow-up with orthopedics for further management

## 2023-03-28 NOTE — ED Provider Notes (Signed)
[place] EMERGENCY DEPARTMENT AT [facility] Provider Note   CSN: [id number] Arrival date & time: 03/28/23  1856     History  Chief Complaint  Patient presents with   Fall   Hip Pain    Amber Hicks is a 72 y.o. female with medical history of GERD, depression, vitamin D deficiency, chronic pain of right ankle.  Patient presents to ED for evaluation of left hip pain.  The patient reports that she had a mechanical ground-level fall prior to arrival.  She denies hitting her head or losing consciousness.  She is here complaining of left hip pain.  The patient was seen for the same complaint 11 days ago.  The patient denies any preceding shortness of breath or chest pain prior to fall.  She denies medications prior to arrival.  Denies hitting her head or losing consciousness, denies taking blood thinning medications.   Fall Pertinent negatives include no chest pain and no shortness of breath.  Hip Pain Pertinent negatives include no chest pain and no shortness of breath.       Home Medications Prior to Admission medications   Medication Sig Start Date End Date Taking? Authorizing Provider  acetaminophen (TYLENOL) 325 MG tablet Take 650 mg by mouth every 6 (six) hours as needed for mild pain.    [provider]  amoxicillin-clavulanate (AUGMENTIN) 875-125 MG tablet Take 1 tablet by mouth every 12 (twelve) hours. 03/26/22   Fayrene Helper, PA-C  diclofenac Sodium (VOLTAREN) 1 % GEL Apply 2 g topically 4 (four) times daily. Patient taking differently: Apply 2 g topically daily as needed. 07/19/21   Ghimire, Werner Lean, MD  lidocaine-prilocaine (EMLA) cream Apply 1 Application topically as needed. Apply to left buttock and cover with saran wrap 1 hour as needed for pain 03/26/22   Fayrene Helper, PA-C  LORazepam (ATIVAN) 0.5 MG tablet 1-2 tabs 30 - 60 min prior to shot. Do not drive with this medicine. Patient taking differently: Take 0.5 mg by mouth every 6 (six) hours as  needed for anxiety. 09/08/21   Rodolph Bong, MD  naproxen (NAPROSYN) 500 MG tablet Take 1 tablet (500 mg total) by mouth 2 (two) times daily as needed. 03/26/22   Fayrene Helper, PA-C  omeprazole (PRILOSEC) 40 MG capsule Take 40 mg by mouth daily.    [provider]  rosuvastatin (CRESTOR) 20 MG tablet Take 1 tablet (20 mg total) by mouth daily. 01/15/21   Corwin Levins, MD  traMADol (ULTRAM) 50 MG tablet Take 1 tablet (50 mg total) by mouth every 6 (six) hours as needed for moderate pain. 10/09/21   Vanetta Mulders, MD  valACYclovir (VALTREX) 500 MG tablet Take 1,000 mg by mouth 3 (three) times daily. 11/05/22   [provider]      Allergies    Doxycycline    Review of Systems   Review of Systems  Constitutional:  Negative for fever.  Respiratory:  Negative for shortness of breath.   Cardiovascular:  Negative for chest pain.  Genitourinary:  Negative for dysuria.  Musculoskeletal:  Positive for arthralgias. Negative for back pain.  All other systems reviewed and are negative.   Physical Exam Updated Vital Signs BP (!) 143/109   Pulse 68   Temp 98 F (36.7 C) (Oral)   Resp 16   Ht 5\' 7"  (1.702 m)   Wt 74.8 kg   SpO2 100%   BMI 25.83 kg/m  Physical Exam Vitals and nursing note reviewed.  Constitutional:      General: She is not in acute distress.    Appearance: Normal appearance. She is not ill-appearing, toxic-appearing or diaphoretic.  HENT:     Head: Normocephalic and atraumatic.     Nose: Nose normal.     Mouth/Throat:     Mouth: Mucous membranes are moist.     Pharynx: Oropharynx is clear.  Eyes:     Extraocular Movements: Extraocular movements intact.     Conjunctiva/sclera: Conjunctivae normal.     Pupils: Pupils are equal, round, and reactive to light.  Cardiovascular:     Rate and Rhythm: Normal rate and regular rhythm.  Pulmonary:     Effort: Pulmonary effort is normal.     Breath sounds: Normal breath sounds. No wheezing.  Abdominal:      General: Abdomen is flat. Bowel sounds are normal.     Palpations: Abdomen is soft.     Tenderness: There is no abdominal tenderness.  Musculoskeletal:     Cervical back: Normal range of motion and neck supple. No tenderness.     Right hip: Normal.     Left hip: Tenderness present. Normal range of motion.     Comments: TTP to anterior hip.  Patient has full range of motion of left hip.  No shortening or rotation externally.  No obvious deformity.  Skin:    General: Skin is warm and dry.     Capillary Refill: Capillary refill takes less than 2 seconds.  Neurological:     Mental Status: She is alert and oriented to person, place, and time.     ED Results / Procedures / Treatments   Labs (all labs ordered are listed, but only abnormal results are displayed) Labs Reviewed - No data to display  EKG None  Radiology CT Hip Left Wo Contrast  Result Date: 03/28/2023 CLINICAL DATA:  Possible left inferior pubic ramus fracture following fall, initial encounter EXAM: CT OF THE LEFT HIP WITHOUT CONTRAST TECHNIQUE: Multidetector CT imaging of the left hip was performed according to the standard protocol. Multiplanar CT image reconstructions were also generated. RADIATION DOSE REDUCTION: This exam was performed according to the departmental dose-optimization program which includes automated exposure control, adjustment of the mA and/or kV according to patient size and/or use of iterative reconstruction technique. COMPARISON:  Plain film from earlier in the same day. FINDINGS: Bones/Joint/Cartilage No acute fracture or dislocation is noted. The area of abnormality seen on prior plain film is not borne out on the CT examination. Ligaments Suboptimally assessed by CT. Muscles and Tendons Surrounding musculature appears within normal limits. Soft tissues No soft tissue abnormality is noted. The pelvic structures appear within normal limits. IMPRESSION: No acute bony abnormality identified. Electronically  Signed   By: Alcide Clever M.D.   On: 03/28/2023 21:56   DG Hip Unilat W or Wo Pelvis 2-3 Views Left  Result Date: 03/28/2023 CLINICAL DATA:  Fall EXAM: DG HIP (WITH OR WITHOUT PELVIS) 2-3V LEFT COMPARISON:  03/17/2023 FINDINGS: No dislocation. Possible nondisplaced left inferior pubic ramus fracture near puboacetabular junction IMPRESSION: Possible nondisplaced left inferior pubic ramus fracture. Suggest CT correlation. Electronically Signed   By: Jasmine Pang M.D.   On: 03/28/2023 20:54    Procedures Procedures   Medications Ordered in ED Medications  HYDROcodone-acetaminophen (NORCO/VICODIN) 5-325 MG per tablet 1 tablet (1 tablet Oral Given 03/28/23 2015)    ED Course/ Medical Decision Making/ A&P    Medical Decision Making Amount and/or Complexity of Data Reviewed Radiology: ordered.  Risk Prescription drug management.   45-year-old female presents to the ED for evaluation.  Please see HPI for further details.  On examination the patient is afebrile, nontachycardic.  Her lung sounds are clear bilaterally and she is not hypoxic.  Her abdomen is soft and compressible throughout.  Neurological examination at baseline.  The patient has no obvious deformity to her left hip.  She has no shortening or rotation externally.  She has tenderness to palpation of the anterior hip but full range of motion otherwise.  5 out of 5 strength to her left lower extremity.  She is able to ambulate with steady gait utilizing walker.  Patient provided 5 mg hydrocodone for pain control.  Plain film imaging of patient left hip collected.  Plain film x-ray imaging shows a possibly nondisplaced left inferior pubic rami fracture.  Will proceed with CT imaging to confirm.  CT imaging does not show any evidence of a nondisplaced left inferior pubic rami fracture.  This area that was originally seen on plain film imaging is not noted on CT scan.  Patient is able to ambulate with steady gait.  Patient will  follow-up with her PCP at this time.  She was advised to return to the ED with any new or worsening signs or symptoms.  The patient was ambulated by the department and showed a steady gait prior to discharge.  She will follow-up with her primary care doctor.  Stable to discharge home.   Final Clinical Impression(s) / ED Diagnoses Final diagnoses:  Left hip pain    Rx / DC Orders ED Discharge Orders     None         Al Decant, PA-C 03/28/23 2319    Gwyneth Sprout, MD 04/01/23 [phone]

## 2023-03-28 NOTE — ED Triage Notes (Addendum)
[facility] for complaints of mechanical fall landed on left hip. Denies and LOC. Did not hit head. Pt seen here for the same a few weeks ago. Pt able to stand to get into wheelchair. No shortening or rotation of leg

## 2023-07-01 DIAGNOSIS — E7800 Pure hypercholesterolemia, unspecified: Secondary | ICD-10-CM | POA: Diagnosis not present

## 2023-07-01 DIAGNOSIS — R634 Abnormal weight loss: Secondary | ICD-10-CM | POA: Diagnosis not present

## 2023-07-02 ENCOUNTER — Other Ambulatory Visit: Payer: Self-pay | Admitting: Internal Medicine

## 2023-07-02 DIAGNOSIS — Z1231 Encounter for screening mammogram for malignant neoplasm of breast: Secondary | ICD-10-CM

## 2023-07-02 DIAGNOSIS — Z13820 Encounter for screening for osteoporosis: Secondary | ICD-10-CM

## 2023-08-05 DIAGNOSIS — M720 Palmar fascial fibromatosis [Dupuytren]: Secondary | ICD-10-CM | POA: Diagnosis not present

## 2023-09-05 ENCOUNTER — Other Ambulatory Visit: Payer: Self-pay

## 2023-09-05 ENCOUNTER — Emergency Department (HOSPITAL_COMMUNITY): Payer: Medicare HMO

## 2023-09-05 ENCOUNTER — Emergency Department (HOSPITAL_COMMUNITY)
Admission: EM | Admit: 2023-09-05 | Discharge: 2023-09-05 | Disposition: A | Discharge status: Home / Self Care | Payer: Medicare HMO | Attending: Emergency Medicine | Admitting: Emergency Medicine

## 2023-09-05 ENCOUNTER — Encounter (HOSPITAL_COMMUNITY): Payer: Self-pay | Admitting: Emergency Medicine

## 2023-09-05 DIAGNOSIS — Z743 Need for continuous supervision: Secondary | ICD-10-CM | POA: Diagnosis not present

## 2023-09-05 DIAGNOSIS — R918 Other nonspecific abnormal finding of lung field: Secondary | ICD-10-CM | POA: Diagnosis not present

## 2023-09-05 DIAGNOSIS — R079 Chest pain, unspecified: Secondary | ICD-10-CM | POA: Diagnosis not present

## 2023-09-05 DIAGNOSIS — I700 Atherosclerosis of aorta: Secondary | ICD-10-CM | POA: Diagnosis not present

## 2023-09-05 DIAGNOSIS — R0789 Other chest pain: Secondary | ICD-10-CM | POA: Diagnosis not present

## 2023-09-05 DIAGNOSIS — R457 State of emotional shock and stress, unspecified: Secondary | ICD-10-CM | POA: Diagnosis not present

## 2023-09-05 DIAGNOSIS — I2510 Atherosclerotic heart disease of native coronary artery without angina pectoris: Secondary | ICD-10-CM | POA: Diagnosis not present

## 2023-09-05 LAB — CBC WITH DIFFERENTIAL/PLATELET
Abs Immature Granulocytes: 0.02 10*3/uL (ref 0.00–0.07)
Basophils Absolute: 0.1 10*3/uL (ref 0.0–0.1)
Basophils Relative: 1 %
Eosinophils Absolute: 0.2 10*3/uL (ref 0.0–0.5)
Eosinophils Relative: 3 %
HCT: 38.9 % (ref 36.0–46.0)
Hemoglobin: 12.7 g/dL (ref 12.0–15.0)
Immature Granulocytes: 0 %
Lymphocytes Relative: 39 %
Lymphs Abs: 3.1 10*3/uL (ref 0.7–4.0)
MCH: 30.3 pg (ref 26.0–34.0)
MCHC: 32.6 g/dL (ref 30.0–36.0)
MCV: 92.8 fL (ref 80.0–100.0)
Monocytes Absolute: 0.6 10*3/uL (ref 0.1–1.0)
Monocytes Relative: 8 %
Neutro Abs: 4 10*3/uL (ref 1.7–7.7)
Neutrophils Relative %: 49 %
Platelets: 253 10*3/uL (ref 150–400)
RBC: 4.19 MIL/uL (ref 3.87–5.11)
RDW: 13.8 % (ref 11.5–15.5)
WBC: 8 10*3/uL (ref 4.0–10.5)
nRBC: 0 % (ref 0.0–0.2)

## 2023-09-05 LAB — COMPREHENSIVE METABOLIC PANEL
ALT: 13 U/L (ref 0–44)
AST: 19 U/L (ref 15–41)
Albumin: 3.3 g/dL — ABNORMAL LOW (ref 3.5–5.0)
Alkaline Phosphatase: 74 U/L (ref 38–126)
Anion gap: 10 (ref 5–15)
BUN: 8 mg/dL (ref 8–23)
CO2: 24 mmol/L (ref 22–32)
Calcium: 9 mg/dL (ref 8.9–10.3)
Chloride: 107 mmol/L (ref 98–111)
Creatinine, Ser: 0.76 mg/dL (ref 0.44–1.00)
GFR, Estimated: >60 mL/min (ref >60)
Glucose, Bld: 92 mg/dL (ref 70–99)
Potassium: 3.5 mmol/L (ref 3.5–5.1)
Sodium: 141 mmol/L (ref 135–145)
Total Bilirubin: 0.4 mg/dL (ref <1.2)
Total Protein: 6.2 g/dL — ABNORMAL LOW (ref 6.5–8.1)

## 2023-09-05 LAB — TROPONIN I (HIGH SENSITIVITY)
Troponin I (High Sensitivity): 7 ng/L (ref <18)
Troponin I (High Sensitivity): 8 ng/L (ref <18)

## 2023-09-05 LAB — MAGNESIUM: Magnesium: 1.9 mg/dL (ref 1.7–2.4)

## 2023-09-05 LAB — LIPASE, BLOOD: Lipase: 34 U/L (ref 11–51)

## 2023-09-05 MED ORDER — OXYCODONE-ACETAMINOPHEN 5-325 MG PO TABS
1.0000 | ORAL_TABLET | Freq: Once | ORAL | Status: AC
  Administered 2023-09-05: 1 via ORAL
  Filled 2023-09-05: qty 1

## 2023-09-05 MED ORDER — IOHEXOL 350 MG/ML SOLN
100.0000 mL | Freq: Once | INTRAVENOUS | Status: AC | PRN
  Administered 2023-09-05: 100 mL via INTRAVENOUS

## 2023-09-05 NOTE — ED Provider Notes (Addendum)
Patient here after episode of chest pain.  She already has unremarkable labs and unremarkable dissection study.  Repeat troponin is pending and resulted and is unremarkable.  She is without any symptoms now.  She wants to go home which I think is reasonable.  Overall atypical story for ACS.  Minimal risk factors.  Will have her follow-up with cardiology outpatient.  Discharged in good condition.  Understands return precautions.  She was made aware of pulmonary nodule.  Will also have her follow-up with vascular outpatient as well with celiac disease as well.  This chart was dictated using voice recognition software.  Despite best efforts to proofread,  errors can occur which can change the documentation meaning.    Virgina Norfolk, DO 09/05/23 1558    Virgina Norfolk, DO 09/05/23 1559

## 2023-09-05 NOTE — ED Provider Triage Note (Signed)
Emergency Medicine Provider Triage Evaluation Note  Amber Hicks , a 72 y.o. female  was evaluated in triage.  Pt complains of chest pain.  Review of Systems  Positive: Chest pain, back pain Negative: Shortness of breath, nausea, diaphoresis, dizziness  Physical Exam  There were no vitals taken for this visit. Gen:   Awake, no distress   Resp:  Normal effort  MSK:   Moves extremities without difficulty, right chest tenderness present  Medical Decision Making  Medically screening exam initiated at 12:00 PM.  Appropriate orders placed.  Amber Hicks was informed that the remainder of the evaluation will be completed by another provider, this initial triage assessment does not replace that evaluation, and the importance of remaining in the ED until their evaluation is complete.  Patient presents for right sided chest pain.  She states that she has had similar symptoms intermittently over the past several years.  Episode today began while at church.  She had right sided upper chest pain radiating to upper back and left shoulder.  She received 324 of ASA prior to arrival.  She has ongoing sharp pain currently.  Pain is not worsened with deep inspiration.  She denies any other associated symptoms.  Home medications include statin and PPI only.  She has never seen a cardiologist.  She does not smoke.  She has no first-degree family members with early ACS.   Gloris Manchester, MD 09/05/23 [phone]

## 2023-09-05 NOTE — ED Triage Notes (Signed)
Pt BIB [facility] from church for sudden onset of 10/10 CP on scene radiating to right arm.  No cardiac hx.  Hx of GERD.  NO NV or abd pain.  No chest pain en route.  324 ASA given by EMS.  Anxiety noted by EMS.  20G L AC 138/76 HR 64 99% RA RR 20

## 2023-09-05 NOTE — Discharge Instructions (Addendum)
Follow-up with cardiology.  Order has been placed for this.  Follow-up with your primary care doctor.  Return if symptoms worsen.  Discussed with your primary care doctor for possible need for repeat CT scan of your chest to further evaluate pulmonary nodule in about a year or so.  He can follow-up with vascular surgery as well given some chronic disease in your vessels of your balance.

## 2023-09-05 NOTE — ED Provider Notes (Signed)
[place] EMERGENCY DEPARTMENT AT [facility] Provider Note   CSN: [id number] Arrival date & time: 09/05/23  1144     History {Add pertinent medical, surgical, social history, OB history to HPI:1} Chief Complaint  Patient presents with   Chest Pain    Amber Hicks is a 72 y.o. female.  HPI     72 year old female with a history of type 2 diabetes, hyperlipidemia, presents with concern for chest pain.  Past Medical History:  Diagnosis Date   Depression 01/22/2012   GERD without esophagitis 12/10/2020   Hx of adenomatous colonic polyps 01/26/2015   Hyperlipidemia 01/24/2012   Mixed hyperlipidemia 01/24/2012   VITAMIN D DEFICIENCY 07/09/2010   Qualifier: Diagnosis of  By: Jonny Ruiz MD, Len Blalock      Home Medications Prior to Admission medications   Medication Sig Start Date End Date Taking? Authorizing Provider  acetaminophen (TYLENOL) 325 MG tablet Take 650 mg by mouth every 6 (six) hours as needed for mild pain.    [provider]  amoxicillin-clavulanate (AUGMENTIN) 875-125 MG tablet Take 1 tablet by mouth every 12 (twelve) hours. 03/26/22   Fayrene Helper, PA-C  diclofenac Sodium (VOLTAREN) 1 % GEL Apply 2 g topically 4 (four) times daily. Patient taking differently: Apply 2 g topically daily as needed. 07/19/21   Ghimire, Werner Lean, MD  lidocaine-prilocaine (EMLA) cream Apply 1 Application topically as needed. Apply to left buttock and cover with saran wrap 1 hour as needed for pain 03/26/22   Fayrene Helper, PA-C  LORazepam (ATIVAN) 0.5 MG tablet 1-2 tabs 30 - 60 min prior to shot. Do not drive with this medicine. Patient taking differently: Take 0.5 mg by mouth every 6 (six) hours as needed for anxiety. 09/08/21   Rodolph Bong, MD  naproxen (NAPROSYN) 500 MG tablet Take 1 tablet (500 mg total) by mouth 2 (two) times daily as needed. 03/26/22   Fayrene Helper, PA-C  omeprazole (PRILOSEC) 40 MG capsule Take 40 mg by mouth daily.    [provider]   rosuvastatin (CRESTOR) 20 MG tablet Take 1 tablet (20 mg total) by mouth daily. 01/15/21   Corwin Levins, MD  traMADol (ULTRAM) 50 MG tablet Take 1 tablet (50 mg total) by mouth every 6 (six) hours as needed for moderate pain. 10/09/21   Vanetta Mulders, MD  valACYclovir (VALTREX) 500 MG tablet Take 1,000 mg by mouth 3 (three) times daily. 11/05/22   [provider]      Allergies    Doxycycline    Review of Systems   Review of Systems  Physical Exam Updated Vital Signs BP (!) 132/104 (BP Location: Left Arm)   Pulse 68   Temp 97.8 F (36.6 C) (Oral)   Resp 16   SpO2 100%  Physical Exam  ED Results / Procedures / Treatments   Labs (all labs ordered are listed, but only abnormal results are displayed) Labs Reviewed  CBC WITH DIFFERENTIAL/PLATELET  LIPASE, BLOOD  MAGNESIUM  COMPREHENSIVE METABOLIC PANEL  TROPONIN I (HIGH SENSITIVITY)    EKG EKG Interpretation Date/Time:  Sunday September 05 2023 11:59:22 EST Ventricular Rate:  62 PR Interval:  146 QRS Duration:  74 QT Interval:  396 QTC Calculation: 401 R Axis:   59  Text Interpretation: Normal sinus rhythm Confirmed by Gloris Manchester [phone]) on 09/05/2023 12:18:07 PM  Radiology DG Chest Portable 1 View  Result Date: 09/05/2023 CLINICAL DATA:  Chest pain. EXAM: PORTABLE CHEST 1 VIEW COMPARISON:  None Available. FINDINGS:  The heart size and mediastinal contours are within normal limits. Vascular calcifications are seen in the aortic arch. Both lungs are clear. The visualized skeletal structures are unremarkable. IMPRESSION: No active disease. Electronically Signed   By: Romona Curls M.D.   On: 09/05/2023 12:39    Procedures Procedures  {Document cardiac monitor, telemetry assessment procedure when appropriate:1}  Medications Ordered in ED Medications  oxyCODONE-acetaminophen (PERCOCET/ROXICET) 5-325 MG per tablet 1 tablet (1 tablet Oral Given 09/05/23 1217)    ED Course/ Medical Decision Making/ A&P   {    Click here for ABCD2, HEART and other calculatorsREFRESH Note before signing :1}                              Medical Decision Making  ***  {Document critical care time when appropriate:1} {Document review of labs and clinical decision tools ie heart score, Chads2Vasc2 etc:1}  {Document your independent review of radiology images, and any outside records:1} {Document your discussion with family members, caretakers, and with consultants:1} {Document social determinants of health affecting pt's care:1} {Document your decision making why or why not admission, treatments were needed:1} Final Clinical Impression(s) / ED Diagnoses Final diagnoses:  None    Rx / DC Orders ED Discharge Orders     None

## 2023-09-06 ENCOUNTER — Telehealth: Payer: Self-pay | Admitting: Vascular Surgery

## 2023-09-17 DIAGNOSIS — R0789 Other chest pain: Secondary | ICD-10-CM | POA: Diagnosis not present

## 2023-09-17 DIAGNOSIS — I700 Atherosclerosis of aorta: Secondary | ICD-10-CM | POA: Diagnosis not present

## 2023-09-17 DIAGNOSIS — R911 Solitary pulmonary nodule: Secondary | ICD-10-CM | POA: Diagnosis not present

## 2023-09-17 DIAGNOSIS — I774 Celiac artery compression syndrome: Secondary | ICD-10-CM | POA: Diagnosis not present

## 2023-09-20 NOTE — Telephone Encounter (Signed)
Unable to reach pt to schedule appt

## 2023-10-01 ENCOUNTER — Ambulatory Visit
Admission: RE | Admit: 2023-10-01 | Discharge: 2023-10-01 | Disposition: A | Discharge status: Home / Self Care | Payer: Medicare HMO | Source: Ambulatory Visit | Attending: Internal Medicine | Admitting: Internal Medicine

## 2023-10-01 DIAGNOSIS — Z1231 Encounter for screening mammogram for malignant neoplasm of breast: Secondary | ICD-10-CM | POA: Diagnosis not present

## 2023-11-06 ENCOUNTER — Encounter (HOSPITAL_COMMUNITY): Payer: Self-pay | Admitting: Emergency Medicine

## 2023-11-06 ENCOUNTER — Emergency Department (HOSPITAL_COMMUNITY)
Admission: EM | Admit: 2023-11-06 | Discharge: 2023-11-06 | Disposition: A | Discharge status: Home / Self Care | Payer: Medicare Other | Attending: Emergency Medicine | Admitting: Emergency Medicine

## 2023-11-06 ENCOUNTER — Other Ambulatory Visit: Payer: Self-pay

## 2023-11-06 ENCOUNTER — Emergency Department (HOSPITAL_COMMUNITY): Payer: Medicare Other

## 2023-11-06 DIAGNOSIS — W108XXA Fall (on) (from) other stairs and steps, initial encounter: Secondary | ICD-10-CM | POA: Diagnosis not present

## 2023-11-06 DIAGNOSIS — M545 Low back pain, unspecified: Secondary | ICD-10-CM | POA: Diagnosis not present

## 2023-11-06 DIAGNOSIS — R531 Weakness: Secondary | ICD-10-CM | POA: Diagnosis not present

## 2023-11-06 DIAGNOSIS — S0990XA Unspecified injury of head, initial encounter: Secondary | ICD-10-CM | POA: Diagnosis not present

## 2023-11-06 DIAGNOSIS — R0781 Pleurodynia: Secondary | ICD-10-CM | POA: Diagnosis not present

## 2023-11-06 DIAGNOSIS — Y92009 Unspecified place in unspecified non-institutional (private) residence as the place of occurrence of the external cause: Secondary | ICD-10-CM | POA: Diagnosis not present

## 2023-11-06 DIAGNOSIS — M47812 Spondylosis without myelopathy or radiculopathy, cervical region: Secondary | ICD-10-CM | POA: Diagnosis not present

## 2023-11-06 DIAGNOSIS — S3992XA Unspecified injury of lower back, initial encounter: Secondary | ICD-10-CM | POA: Diagnosis not present

## 2023-11-06 DIAGNOSIS — I700 Atherosclerosis of aorta: Secondary | ICD-10-CM | POA: Diagnosis not present

## 2023-11-06 DIAGNOSIS — Z043 Encounter for examination and observation following other accident: Secondary | ICD-10-CM | POA: Diagnosis not present

## 2023-11-06 DIAGNOSIS — W19XXXA Unspecified fall, initial encounter: Secondary | ICD-10-CM

## 2023-11-06 DIAGNOSIS — M549 Dorsalgia, unspecified: Secondary | ICD-10-CM | POA: Diagnosis not present

## 2023-11-06 DIAGNOSIS — Z743 Need for continuous supervision: Secondary | ICD-10-CM | POA: Diagnosis not present

## 2023-11-06 MED ORDER — ONDANSETRON HCL 4 MG/2ML IJ SOLN
4.0000 mg | Freq: Once | INTRAMUSCULAR | Status: DC
  Filled 2023-11-06: qty 2

## 2023-11-06 MED ORDER — FENTANYL CITRATE PF 50 MCG/ML IJ SOSY
25.0000 ug | PREFILLED_SYRINGE | Freq: Once | INTRAMUSCULAR | Status: AC
  Administered 2023-11-06: 25 ug via INTRAVENOUS
  Filled 2023-11-06: qty 1

## 2023-11-06 NOTE — ED Notes (Signed)
 Help get patient undressed into a gown on the monitor patient is resting with call bell in reach

## 2023-11-06 NOTE — ED Triage Notes (Signed)
 Pt BIB by EMS for mechanical fall at home. Denies head injury, no blood thinners. Alert and oriented on arrival.

## 2023-11-06 NOTE — ED Notes (Signed)
 Patient transported to CT

## 2023-11-06 NOTE — ED Provider Notes (Signed)
 Amber Hicks   CSN: [id number] Arrival date & time: 11/06/23  1702     History  Chief Complaint  Patient presents with   Fall   Back Pain    Amber Hicks is a 73 y.o. female.  73 year old female presents today for concern of a fall that occurred just prior to arrival.  She states that she tripped and fell down 5 steps.  She states she landed on the right side.  She believes she did not hit her head but is not absolutely certain.  She denies any loss of consciousness.  She is sure about this.  Not on any blood thinning medication.  The history is provided by the patient. No language interpreter was used.       Home Medications Prior to Admission medications   Medication Sig Start Date End Date Taking? Authorizing Provider  acetaminophen  (TYLENOL ) 325 MG tablet Take 650 mg by mouth every 6 (six) hours as needed for mild pain.    [provider]  amoxicillin -clavulanate (AUGMENTIN ) 875-125 MG tablet Take 1 tablet by mouth every 12 (twelve) hours. 03/26/22   Nivia Colon, PA-C  diclofenac  Sodium (VOLTAREN ) 1 % GEL Apply 2 g topically 4 (four) times daily. Patient taking differently: Apply 2 g topically daily as needed. 07/19/21   Ghimire, Donalda HERO, MD  lidocaine -prilocaine  (EMLA ) cream Apply 1 Application topically as needed. Apply to left buttock and cover with saran wrap 1 hour as needed for pain 03/26/22   Nivia Colon, PA-C  LORazepam  (ATIVAN ) 0.5 MG tablet 1-2 tabs 30 - 60 min prior to shot. Do not drive with this medicine. Patient taking differently: Take 0.5 mg by mouth every 6 (six) hours as needed for anxiety. 09/08/21   Corey, Evan S, MD  naproxen  (NAPROSYN ) 500 MG tablet Take 1 tablet (500 mg total) by mouth 2 (two) times daily as needed. 03/26/22   Nivia Colon, PA-C  omeprazole (PRILOSEC) 40 MG capsule Take 40 mg by mouth daily.    [provider]  rosuvastatin  (CRESTOR ) 20 MG tablet Take 1 tablet  (20 mg total) by mouth daily. 01/15/21   Norleen Lynwood ORN, MD  traMADol  (ULTRAM ) 50 MG tablet Take 1 tablet (50 mg total) by mouth every 6 (six) hours as needed for moderate pain. 10/09/21   Zackowski, Scott, MD  valACYclovir (VALTREX) 500 MG tablet Take 1,000 mg by mouth 3 (three) times daily. 11/05/22   [provider]      Allergies    Doxycycline     Review of Systems   Review of Systems  Constitutional:  Negative for chills and fever.  Musculoskeletal:  Positive for arthralgias, back pain and neck pain.  Neurological:  Negative for syncope and light-headedness.  All other systems reviewed and are negative.   Physical Exam Updated Vital Signs BP 129/81   Pulse 68   Temp 98.8 F (37.1 C) (Oral)   Resp 15   SpO2 98%  Physical Exam Vitals and nursing Hicks reviewed.  Constitutional:      General: She is not in acute distress.    Appearance: Normal appearance. She is not ill-appearing.  HENT:     Head: Normocephalic and atraumatic.     Nose: Nose normal.  Eyes:     Conjunctiva/sclera: Conjunctivae normal.  Cardiovascular:     Rate and Rhythm: Normal rate and regular rhythm.  Pulmonary:     Effort: Pulmonary effort is normal. No respiratory distress.  Musculoskeletal:        General: No deformity. Normal range of motion.     Cervical back: Normal range of motion.     Comments: Good range of motion bilateral lower extremities.  Tenderness palpation present over the right hip, lumbar spine and lumbar paraspinal muscles, as well as cervical spine.  No tenderness to palpation over the T-spine.  Spine well aligned.  Good range of motion bilateral upper extremities.  No tenderness palpation to any of the major joints in the upper extremity.  Skin:    Findings: No rash.  Neurological:     Mental Status: She is alert.     ED Results / Procedures / Treatments   Labs (all labs ordered are listed, but only abnormal results are displayed) Labs Reviewed - No data to  display  EKG None  Radiology No results found.  Procedures Procedures    Medications Ordered in ED Medications  fentaNYL  (SUBLIMAZE ) injection 25 mcg (has no administration in time range)  ondansetron  (ZOFRAN ) injection 4 mg (has no administration in time range)    ED Course/ Medical Decision Making/ A&P                                 Medical Decision Making Amount and/or Complexity of Data Reviewed Radiology: ordered.  Risk Prescription drug management.   Medical Decision Making / ED Course   This patient presents to the ED for concern of fall, low back pain, neck pain this involves an extensive number of treatment options, and is a complaint that carries with it a high risk of complications and morbidity.  The differential diagnosis includes fracture, muscle strain, contusion, intracranial bleed  MDM: 73 year old female presents today for concern of a fall that occurred just prior to arrival.  Not on blood thinners.  She tripped down and fell down 5 steps.  No loss consciousness.  Denies hitting her head but is not certain.  Complains of pain to her right low back.  CT head, CT cervical spine, CT L-spine, right ribs x-ray, pelvic x-ray without evidence of acute concern.  She has been ambulatory in the emergency department.  Reports significant improvement in pain.  She is stable for discharge.  Discharged in stable condition.  Return precaution discussed.  Patient voices understanding and is in agreement with plan.   Lab Tests: -I ordered, reviewed, and interpreted labs.   The pertinent results include:   Labs Reviewed - No data to display    EKG  EKG Interpretation Date/Time:    Ventricular Rate:    PR Interval:    QRS Duration:    QT Interval:    QTC Calculation:   R Axis:      Text Interpretation:           Imaging Studies ordered: I ordered imaging studies including CT head, CT C-spine, CT L-spine, right rib x-ray, pelvic x-ray I independently  visualized and interpreted imaging. I agree with the radiologist interpretation   Medicines ordered and prescription drug management: Meds ordered this encounter  Medications   fentaNYL  (SUBLIMAZE ) injection 25 mcg   ondansetron  (ZOFRAN ) injection 4 mg    -I have reviewed the patients home medicines and have made adjustments as needed   Reevaluation: After the interventions noted above, I reevaluated the patient and found that they have :improved  Co morbidities that complicate the patient evaluation  Past Medical History:  Diagnosis Date  Depression 01/22/2012   GERD without esophagitis 12/10/2020   Hx of adenomatous colonic polyps 01/26/2015   Hyperlipidemia 01/24/2012   Mixed hyperlipidemia 01/24/2012   VITAMIN D  DEFICIENCY 07/09/2010   Qualifier: Diagnosis of  By: Norleen MD, Lynwood ORN       Dispostion: Discharged in stable condition.  Return precaution discussed.  Patient voices understanding and is in agreement with plan.   Final Clinical Impression(s) / ED Diagnoses Final diagnoses:  Fall, initial encounter    Rx / DC Orders ED Discharge Orders     None         Hildegard Loge, PA-C 11/06/23 2023    Yolande Lamar BROCKS, MD 11/08/23 [phone]

## 2023-11-06 NOTE — Discharge Instructions (Signed)
 Your workup was reassuring.  CT scans did not show any concerning findings.  No fractures.  You are able ambulate in the emergency department.  Take Tylenol  as you need to for pain control.  Follow-up with your primary care doctor.  Return for any concerning symptoms.

## 2023-11-06 NOTE — ED Notes (Signed)
 Pt ambulated to restroom and back to treatment room with a steady gait.

## 2023-12-31 DIAGNOSIS — K219 Gastro-esophageal reflux disease without esophagitis: Secondary | ICD-10-CM | POA: Diagnosis not present

## 2023-12-31 DIAGNOSIS — Z1211 Encounter for screening for malignant neoplasm of colon: Secondary | ICD-10-CM | POA: Diagnosis not present

## 2023-12-31 DIAGNOSIS — G8929 Other chronic pain: Secondary | ICD-10-CM | POA: Diagnosis not present

## 2023-12-31 DIAGNOSIS — R911 Solitary pulmonary nodule: Secondary | ICD-10-CM | POA: Diagnosis not present

## 2023-12-31 DIAGNOSIS — Z23 Encounter for immunization: Secondary | ICD-10-CM | POA: Diagnosis not present

## 2023-12-31 DIAGNOSIS — I2510 Atherosclerotic heart disease of native coronary artery without angina pectoris: Secondary | ICD-10-CM | POA: Diagnosis not present

## 2023-12-31 DIAGNOSIS — Z0000 Encounter for general adult medical examination without abnormal findings: Secondary | ICD-10-CM | POA: Diagnosis not present

## 2023-12-31 DIAGNOSIS — R7303 Prediabetes: Secondary | ICD-10-CM | POA: Diagnosis not present

## 2023-12-31 DIAGNOSIS — I700 Atherosclerosis of aorta: Secondary | ICD-10-CM | POA: Diagnosis not present

## 2023-12-31 DIAGNOSIS — M7918 Myalgia, other site: Secondary | ICD-10-CM | POA: Diagnosis not present

## 2023-12-31 DIAGNOSIS — E7800 Pure hypercholesterolemia, unspecified: Secondary | ICD-10-CM | POA: Diagnosis not present

## 2024-01-25 ENCOUNTER — Ambulatory Visit
Admission: RE | Admit: 2024-01-25 | Discharge: 2024-01-25 | Disposition: A | Discharge status: Home / Self Care | Payer: Medicare Other | Source: Ambulatory Visit | Attending: Internal Medicine | Admitting: Internal Medicine

## 2024-01-25 DIAGNOSIS — M8588 Other specified disorders of bone density and structure, other site: Secondary | ICD-10-CM | POA: Diagnosis not present

## 2024-01-25 DIAGNOSIS — Z13820 Encounter for screening for osteoporosis: Secondary | ICD-10-CM

## 2024-01-28 ENCOUNTER — Ambulatory Visit: Payer: Medicare Other | Attending: Cardiovascular Disease | Admitting: Cardiovascular Disease

## 2024-01-28 ENCOUNTER — Ambulatory Visit

## 2024-01-28 ENCOUNTER — Encounter: Payer: Self-pay | Admitting: Cardiovascular Disease

## 2024-01-28 VITALS — BP 118/70 | HR 91 | Ht 67.0 in | Wt 151.2 lb

## 2024-01-28 DIAGNOSIS — R42 Dizziness and giddiness: Secondary | ICD-10-CM

## 2024-01-28 NOTE — Progress Notes (Unsigned)
 Applied a 14 day Zio XT monitor to patient in the office ?

## 2024-01-28 NOTE — Progress Notes (Signed)
  Cardiology Office Note:  .   Date:  01/28/2024  ID:  Corey Dial, DOB 09-24-1951, MRN [id number] PCP: Elester Grim, MD  [facility] Providers Cardiologist:  None    History of Present Illness: .   Amber Hicks is a 73 y.o. female with dizziness.  Seen with guardian, Ms. Howell   Difficult historian - unable to give me much history   Complains of dizziness, She is frequently dizzy, Associated with headaches  No syncope   Has seen her primary doctor   Has had lots of stomach issue No CP  no dyspnea  Is unsteady when she stands up .  She rides her bike on occasion Works in her yard often    Walks quite a bit ,   The dizziness is not associated with exercise        ROS:   Studies Reviewed: .         Risk Assessment/Calculations:             Physical Exam:   VS:  BP 118/70   Pulse 91   Ht 5\' 7"  (1.702 m)   Wt 151 lb 3.2 oz (68.6 kg)   SpO2 97%   BMI 23.68 kg/m    Wt Readings from Last 3 Encounters:  01/28/24 151 lb 3.2 oz (68.6 kg)  03/28/23 164 lb 14.5 oz (74.8 kg)  08/17/22 165 lb (74.8 kg)    GEN: middle age female,   appears older than stated age ,   in no acute distress NECK: No JVD; No carotid bruits CARDIAC: RRR, no murmurs, rubs, gallops RESPIRATORY:  Clear to auscultation without rales, wheezing or rhonchi  ABDOMEN: Soft, non-tender, non-distended EXTREMITIES:  No edema; No deformity   ASSESSMENT AND PLAN: .    Dizziness: She is somewhat of a difficult historian her symptoms are quite atypical.  She does have some unsteadiness when she stands up.  She denies any chest pain or shortness of breath. Will get an echocardiogram and a 14-day event monitor for further cardiac evaluation.  Will see her back on an as-needed basis.  I suspect that this is not a cardiac issue.  Will plan on her following up with her primary medical doctor.         Dispo: PRN   Signed, Ahmad Alert, MD

## 2024-01-28 NOTE — Patient Instructions (Signed)
 Medication Instructions:  Your physician recommends that you continue on your current medications as directed. Please refer to the Current Medication list given to you today.  *If you need a refill on your cardiac medications before your next appointment, please call your pharmacy*  Lab Work: If you have labs (blood work) drawn today and your tests are completely normal, you will receive your results only by: MyChart Message (if you have MyChart) OR A paper copy in the mail If you have any lab test that is abnormal or we need to change your treatment, we will call you to review the results.  Testing/Procedures: Your physician has requested that you have an echocardiogram. Echocardiography is a painless test that uses sound waves to create images of your heart. It provides your doctor with information about the size and shape of your heart and how well your heart's chambers and valves are working. This procedure takes approximately one hour. There are no restrictions for this procedure. Please do NOT wear cologne, perfume, aftershave, or lotions (deodorant is allowed). Please arrive 15 minutes prior to your appointment time.  Please note: We ask at that you not bring children with you during ultrasound (echo/ vascular) testing. Due to room size and safety concerns, children are not allowed in the ultrasound rooms during exams. Our front office staff cannot provide observation of children in our lobby area while testing is being conducted. An adult accompanying a patient to their appointment will only be allowed in the ultrasound room at the discretion of the ultrasound technician under special circumstances. We apologize for any inconvenience. Your physician has recommended that you wear a 14 day monitor. The monitors are medical devices that record the heart's electrical activity. Doctors most often us  these monitors to diagnose arrhythmias. Arrhythmias are problems with the speed or rhythm of the  heartbeat. The monitor is a small, portable device. You can wear one while you do your normal daily activities. This is usually used to diagnose what is causing palpitations/syncope (passing out).  Follow-Up: At [facility] you and your health needs are our priority.  As part of our continuing mission to provide you with exceptional heart care, our providers are all part of one team.  This team includes your primary Cardiologist (physician) and Advanced Practice Providers or APPs (Physician Assistants and Nurse Practitioners) who all work together to provide you with the care you need, when you need it.  Your next appointment:   As needed

## 2024-02-11 ENCOUNTER — Encounter

## 2024-02-16 DIAGNOSIS — R42 Dizziness and giddiness: Secondary | ICD-10-CM | POA: Diagnosis not present

## 2024-02-17 ENCOUNTER — Ambulatory Visit: Payer: Self-pay | Admitting: Internal Medicine

## 2024-02-17 DIAGNOSIS — R42 Dizziness and giddiness: Secondary | ICD-10-CM | POA: Diagnosis not present

## 2024-02-25 ENCOUNTER — Encounter: Admitting: Internal Medicine

## 2024-02-26 DIAGNOSIS — E7800 Pure hypercholesterolemia, unspecified: Secondary | ICD-10-CM | POA: Diagnosis not present

## 2024-03-07 ENCOUNTER — Ambulatory Visit (HOSPITAL_COMMUNITY)
Admission: RE | Admit: 2024-03-07 | Discharge: 2024-03-07 | Disposition: A | Discharge status: Home / Self Care | Source: Ambulatory Visit | Attending: Cardiovascular Disease | Admitting: Cardiovascular Disease

## 2024-03-07 DIAGNOSIS — I358 Other nonrheumatic aortic valve disorders: Secondary | ICD-10-CM

## 2024-03-07 DIAGNOSIS — I071 Rheumatic tricuspid insufficiency: Secondary | ICD-10-CM | POA: Insufficient documentation

## 2024-03-07 DIAGNOSIS — R42 Dizziness and giddiness: Secondary | ICD-10-CM

## 2024-03-07 DIAGNOSIS — E785 Hyperlipidemia, unspecified: Secondary | ICD-10-CM | POA: Insufficient documentation

## 2024-03-07 LAB — ECHOCARDIOGRAM COMPLETE
Area-P 1/2: 3.81 cm2
S' Lateral: 2.7 cm

## 2024-03-24 ENCOUNTER — Encounter: Payer: Self-pay | Admitting: Internal Medicine

## 2024-03-24 ENCOUNTER — Telehealth: Payer: Self-pay | Admitting: *Deleted

## 2024-03-24 ENCOUNTER — Ambulatory Visit (AMBULATORY_SURGERY_CENTER): Admitting: *Deleted

## 2024-03-24 VITALS — Ht 67.0 in | Wt 158.0 lb

## 2024-03-24 DIAGNOSIS — Z860100 Personal history of colon polyps, unspecified: Secondary | ICD-10-CM

## 2024-03-24 MED ORDER — NA SULFATE-K SULFATE-MG SULF 17.5-3.13-1.6 GM/177ML PO SOLN: 1.0000 | Freq: Once | ORAL | 0 refills | Status: AC

## 2024-03-24 NOTE — Telephone Encounter (Signed)
 Attempt to reach pt for pre-visit. LM with call back #.  Will attempt to reach again in 5 min due to no other # listed in profile  Pt returned call

## 2024-03-24 NOTE — Progress Notes (Signed)
 Pt's name and DOB verified at the beginning of the pre-visit wit 2 identifiers  Permission given to speak with Social Worker Guardian  and states she will be coming with pt day of procedure  Pt denies any difficulty with ambulating,sitting, laying down or rolling side to side  Pt has no issues moving head neck or swallowing  No egg or soy allergy known to patient   No issues known to pt with past sedation with any surgeries or procedures  Patient denies ever being intubated  No FH of Malignant Hyperthermia  Pt is not on home 02   Pt is not on blood thinners   Pt denies issues with constipation   Pt is not on dialysis  Pt had recent monitoring of heart but couldnot clarify why and is waiting on results  Pt denies any upcoming cardiac testing  Patient's chart reviewed by Norleen Schillings CNRA prior to pre-visit and patient appropriate for the [facility].  Pre-visit completed and red dot placed by patient's name on their procedure day (on provider's schedule).     Visit by phone  Pt states weight is 158 lb   IInstructions reviewed. Pt given  both [facility] main # and MD on call # prior to instructions.  Pt states understanding of instructions. Instructed pt to review instructions again prior to procedure and call main # given if has questions.. Pt states they will.   Instructed pt on where to find instructions on My Chart.;b

## 2024-04-06 NOTE — Progress Notes (Signed)
 [facility] History and Physical   Primary Care Physician:  Vernon Velna SAUNDERS, MD   Reason for Procedure:  History of colon polyps  Plan:    Colonoscopy     HPI: Amber Hicks is a 73 y.o. female status post removal of 2 adenomas maximum 5 mm in 2016.  She presents for a surveillance colonoscopy.  Exam was otherwise normal.   Past Medical History:  Diagnosis Date   Depression 01/22/2012   GERD without esophagitis 12/10/2020   Hx of adenomatous colonic polyps 01/26/2015   Hyperlipidemia 01/24/2012   Mixed hyperlipidemia 01/24/2012   Osteoporosis    VITAMIN D  DEFICIENCY 07/09/2010   Qualifier: Diagnosis of  By: Norleen MD, [place] ORN     Past Surgical History:  Procedure Laterality Date   ceaserian section     1 time   COLONOSCOPY        Current Outpatient Medications  Medication Sig Dispense Refill   acetaminophen  (TYLENOL ) 325 MG tablet Take 650 mg by mouth every 6 (six) hours as needed for mild pain.     ibuprofen  (ADVIL ) 800 MG tablet Take 800 mg by mouth every 6 (six) hours as needed.     Multiple Vitamin (MULTIVITAMIN ADULT PO) Multivitamin     omeprazole (PRILOSEC) 40 MG capsule Take 40 mg by mouth daily.     rosuvastatin  (CRESTOR ) 20 MG tablet Take 1 tablet (20 mg total) by mouth daily. 90 tablet 3   amoxicillin -clavulanate (AUGMENTIN ) 875-125 MG tablet Take 1 tablet by mouth every 12 (twelve) hours. (Patient not taking: Reported on 03/24/2024) 14 tablet 0   diclofenac  Sodium (VOLTAREN ) 1 % GEL Apply 2 g topically 4 (four) times daily. (Patient not taking: Reported on 03/24/2024) 2 g 0   lidocaine -prilocaine  (EMLA ) cream Apply 1 Application topically as needed. Apply to left buttock and cover with saran wrap 1 hour as needed for pain (Patient not taking: Reported on 03/24/2024) 30 g 1   LORazepam  (ATIVAN ) 0.5 MG tablet 1-2 tabs 30 - 60 min prior to shot. Do not drive with this medicine. (Patient not taking: Reported on 01/28/2024) 4 tablet 0   meloxicam   (MOBIC ) 15 MG tablet Take 15 mg by mouth daily. (Patient not taking: Reported on 03/24/2024)     naproxen  (NAPROSYN ) 500 MG tablet Take 1 tablet (500 mg total) by mouth 2 (two) times daily as needed. (Patient not taking: Reported on 04/07/2024) 180 tablet 2   traMADol  (ULTRAM ) 50 MG tablet Take 1 tablet (50 mg total) by mouth every 6 (six) hours as needed for moderate pain. (Patient not taking: Reported on 03/24/2024) 15 tablet 0   valACYclovir (VALTREX) 500 MG tablet Take 1,000 mg by mouth 3 (three) times daily. (Patient not taking: Reported on 03/24/2024)     Current Facility-Administered Medications  Medication Dose Route Frequency Provider Last Rate Last Admin   0.9 %  sodium chloride  infusion  500 mL Intravenous Once Avram Lupita BRAVO, MD        Allergies as of 04/07/2024 - Review Complete 04/07/2024  Allergen Reaction Noted   Doxycycline  Rash 04/12/2013    Family History  Problem Relation Age of Onset   Heart disease Mother    Heart disease Brother    Colon cancer Neg Hx    Colon polyps Neg Hx    Esophageal cancer Neg Hx    Rectal cancer Neg Hx    Stomach cancer Neg Hx     Social History   Socioeconomic History   Marital  status: Widowed    Spouse name: Not on file   Number of children: Not on file   Years of education: Not on file   Highest education level: Not on file  Occupational History   Not on file  Tobacco Use   Smoking status: Never   Smokeless tobacco: Never  Vaping Use   Vaping status: Never Used  Substance and Sexual Activity   Alcohol use: No    Alcohol/week: 0.0 standard drinks of alcohol   Drug use: No   Sexual activity: Yes    Birth control/protection: Post-menopausal  Other Topics Concern   Not on file  Social History Narrative   Not on file   Social Drivers of Health   Financial Resource Strain: Not on file  Food Insecurity: Not on file  Transportation Needs: Not on file  Physical Activity: Not on file  Stress: Not on file  Social  Connections: Not on file  Intimate Partner Violence: Not on file    Review of Systems:  All other review of systems negative except as mentioned in the HPI.  Physical Exam: Vital signs BP 130/61   Pulse (!) 58   Temp 97.8 F (36.6 C) (Skin)   Resp 13   Ht 5' 7 (1.702 m)   Wt 158 lb (71.7 kg)   SpO2 100%   BMI 24.75 kg/m   General:   Alert,  Well-developed, well-nourished, pleasant and cooperative in NAD Lungs:  Clear throughout to auscultation.   Heart:  Regular rate and rhythm; no murmurs, clicks, rubs,  or gallops. Abdomen:  Soft, nontender and nondistended. Normal bowel sounds.   Neuro/Psych:  Alert and cooperative. Normal mood and affect. A and O x 3   @Jaiel Saraceno  CHARLENA Commander, MD, [facility] [phone] (pager) 04/07/2024 11:38 AM@

## 2024-04-07 ENCOUNTER — Encounter: Payer: Self-pay | Admitting: Internal Medicine

## 2024-04-07 ENCOUNTER — Ambulatory Visit (AMBULATORY_SURGERY_CENTER): Admitting: Internal Medicine

## 2024-04-07 VITALS — BP 120/79 | HR 60 | Temp 97.8°F | Resp 14 | Ht 67.0 in | Wt 158.0 lb

## 2024-04-07 DIAGNOSIS — D122 Benign neoplasm of ascending colon: Secondary | ICD-10-CM | POA: Diagnosis not present

## 2024-04-07 DIAGNOSIS — Z860100 Personal history of colon polyps, unspecified: Secondary | ICD-10-CM

## 2024-04-07 DIAGNOSIS — Z1211 Encounter for screening for malignant neoplasm of colon: Secondary | ICD-10-CM | POA: Diagnosis not present

## 2024-04-07 DIAGNOSIS — K635 Polyp of colon: Secondary | ICD-10-CM | POA: Diagnosis not present

## 2024-04-07 DIAGNOSIS — Z860101 Personal history of adenomatous and serrated colon polyps: Secondary | ICD-10-CM

## 2024-04-07 MED ORDER — SODIUM CHLORIDE 0.9 % IV SOLN: 500.0000 mL | Freq: Once | INTRAVENOUS | Status: DC

## 2024-04-07 NOTE — Progress Notes (Signed)
 VS by DT  Pt's states no medical or surgical changes since previsit or office visit.

## 2024-04-07 NOTE — Op Note (Signed)
 [facility] Patient Name: Amber Hicks Procedure Date: 04/07/2024 11:24 AM MRN: [id number] Endoscopist: Lupita FORBES Commander , MD, [id number] Age: 73 Referring MD:  Date of Birth: August 31, 1951 Gender: Female Account #: [id number] Procedure:                Colonoscopy Indications:              Surveillance: Personal history of adenomatous                            polyps on last colonoscopy > 5 years ago, Last                            colonoscopy: 2016 Medicines:                Monitored Anesthesia Care Procedure:                Pre-Anesthesia Assessment:                           - Prior to the procedure, a History and Physical                            was performed, and patient medications and                            allergies were reviewed. The patient's tolerance of                            previous anesthesia was also reviewed. The risks                            and benefits of the procedure and the sedation                            options and risks were discussed with the patient.                            All questions were answered, and informed consent                            was obtained. Prior Anticoagulants: The patient has                            taken no anticoagulant or antiplatelet agents. ASA                            Grade Assessment: II - A patient with mild systemic                            disease. After reviewing the risks and benefits,                            the patient was deemed in satisfactory condition to  undergo the procedure.                           After obtaining informed consent, the colonoscope                            was passed under direct vision. Throughout the                            procedure, the patient's blood pressure, pulse, and                            oxygen saturations were monitored continuously. The                            Olympus Scope [id number] was introduced  through the                            anus and advanced to the the cecum, identified by                            appendiceal orifice and ileocecal valve. The                            colonoscopy was performed without difficulty. The                            patient tolerated the procedure well. The quality                            of the bowel preparation was good. The ileocecal                            valve, appendiceal orifice, and rectum were                            photographed. The bowel preparation used was SUPREP                            via split dose instruction. Scope In: 11:48:40 AM Scope Out: 12:05:01 PM Scope Withdrawal Time: 0 hours 12 minutes 26 seconds  Total Procedure Duration: 0 hours 16 minutes 21 seconds  Findings:                 The perianal and digital rectal examinations were                            normal.                           Three sessile polyps were found in the ascending                            colon. The polyps were diminutive in size. These  polyps were removed with a cold snare. Resection                            and retrieval were complete. Verification of                            patient identification for the specimen was done.                            Estimated blood loss was minimal.                           The exam was otherwise without abnormality on                            direct and retroflexion views. Complications:            No immediate complications. Estimated Blood Loss:     Estimated blood loss was minimal. Impression:               - Three diminutive polyps in the ascending colon,                            removed with a cold snare. Resected and retrieved.                           - The examination was otherwise normal on direct                            and retroflexion views.                           - Personal history of colonic polyps. Two adenomas                             max 5 mm 2016 Recommendation:           - Patient has a contact number available for                            emergencies. The signs and symptoms of potential                            delayed complications were discussed with the                            patient. Return to normal activities tomorrow.                            Written discharge instructions were provided to the                            patient.                           - Resume previous diet.                           -  Continue present medications.                           - Await pathology results.                           - Repeat colonoscopy is recommended. The                            colonoscopy date will be determined after pathology                            results from today's exam become available for                            review. Lupita FORBES Commander, MD 04/07/2024 12:21:16 PM This report has been signed electronically.

## 2024-04-07 NOTE — Patient Instructions (Addendum)
 I found and removed 3 small polyps today.  I will let you know pathology results and when or if  to have another routine colonoscopy by mail and/or My Chart.  I appreciate the opportunity to care for you. Lupita CHARLENA Commander, MD, FACG  YOU HAD AN ENDOSCOPIC PROCEDURE TODAY AT THE [facility]:   Refer to the procedure report that was given to you for any specific questions about what was found during the examination.  If the procedure report does not answer your questions, please call your gastroenterologist to clarify.  If you requested that your care partner not be given the details of your procedure findings, then the procedure report has been included in a sealed envelope for you to review at your convenience later.  YOU SHOULD EXPECT: Some feelings of bloating in the abdomen. Passage of more gas than usual.  Walking can help get rid of the air that was put into your GI tract during the procedure and reduce the bloating. If you had a lower endoscopy (such as a colonoscopy or flexible sigmoidoscopy) you may notice spotting of blood in your stool or on the toilet paper. If you underwent a bowel prep for your procedure, you may not have a normal bowel movement for a few days.  Please Note:  You might notice some irritation and congestion in your nose or some drainage.  This is from the oxygen used during your procedure.  There is no need for concern and it should clear up in a day or so.  SYMPTOMS TO REPORT IMMEDIATELY:  Following lower endoscopy (colonoscopy or flexible sigmoidoscopy):  Excessive amounts of blood in the stool  Significant tenderness or worsening of abdominal pains  Swelling of the abdomen that is new, acute  For urgent or emergent issues, a gastroenterologist can be reached at any hour by calling [phone]. Do not use MyChart messaging for urgent concerns.    DIET:  We do recommend a small meal at first, but then you may proceed to your regular diet.  Drink  plenty of fluids but you should avoid alcoholic beverages for 24 hours.  ACTIVITY:  You should plan to take it easy for the rest of today and you should NOT DRIVE or use heavy machinery until tomorrow (because of the sedation medicines used during the test).    FOLLOW UP: Our staff will call the number listed on your records the next business day following your procedure.  We will call around 7:15- 8:00 am to check on you and address any questions or concerns that you may have regarding the information given to you following your procedure. If we do not reach you, we will leave a message.     If any biopsies were taken you will be contacted by phone or by letter within the next 1-3 weeks.  Please call us  at [phone] if you have not heard about the biopsies in 3 weeks.    SIGNATURES/CONFIDENTIALITY: You and/or your care partner have signed paperwork which will be entered into your electronic medical record.  These signatures attest to the fact that that the information above on your After Visit Summary has been reviewed and is understood.  Full responsibility of the confidentiality of this discharge information lies with you and/or your care-partner.

## 2024-04-07 NOTE — Progress Notes (Signed)
 Report to PACU, RN, vss, BBS= Clear.

## 2024-04-10 ENCOUNTER — Telehealth: Payer: Self-pay

## 2024-04-10 NOTE — Telephone Encounter (Signed)
 No answer after follow up call. Voice message left.

## 2024-04-11 LAB — SURGICAL PATHOLOGY

## 2024-04-16 ENCOUNTER — Ambulatory Visit: Payer: Self-pay | Admitting: Internal Medicine

## 2024-04-16 DIAGNOSIS — Z860101 Personal history of adenomatous and serrated colon polyps: Secondary | ICD-10-CM

## 2024-04-27 DIAGNOSIS — E7800 Pure hypercholesterolemia, unspecified: Secondary | ICD-10-CM | POA: Diagnosis not present

## 2024-09-11 ENCOUNTER — Other Ambulatory Visit: Payer: Self-pay

## 2024-09-11 ENCOUNTER — Emergency Department (HOSPITAL_BASED_OUTPATIENT_CLINIC_OR_DEPARTMENT_OTHER)

## 2024-09-11 ENCOUNTER — Encounter (HOSPITAL_BASED_OUTPATIENT_CLINIC_OR_DEPARTMENT_OTHER): Payer: Self-pay | Admitting: Emergency Medicine

## 2024-09-11 ENCOUNTER — Emergency Department (HOSPITAL_BASED_OUTPATIENT_CLINIC_OR_DEPARTMENT_OTHER)
Admission: EM | Admit: 2024-09-11 | Discharge: 2024-09-11 | Disposition: A | Discharge status: Home / Self Care | Source: Home / Self Care | Attending: Emergency Medicine | Admitting: Emergency Medicine

## 2024-09-11 DIAGNOSIS — M19042 Primary osteoarthritis, left hand: Secondary | ICD-10-CM | POA: Diagnosis not present

## 2024-09-11 DIAGNOSIS — M25532 Pain in left wrist: Secondary | ICD-10-CM | POA: Diagnosis present

## 2024-09-11 MED ORDER — PREDNISONE 10 MG PO TABS: ORAL_TABLET | ORAL | 0 refills | Status: AC

## 2024-09-11 MED ORDER — NAPROXEN 500 MG PO TABS: 500.0000 mg | ORAL_TABLET | Freq: Two times a day (BID) | ORAL | 0 refills | Status: AC | PRN

## 2024-09-11 MED ORDER — NAPROXEN 250 MG PO TABS
500.0000 mg | ORAL_TABLET | Freq: Once | ORAL | Status: AC
  Administered 2024-09-11: 20:00:00 500 mg via ORAL
  Filled 2024-09-11: qty 2

## 2024-09-11 MED ORDER — PREDNISONE 20 MG PO TABS
40.0000 mg | ORAL_TABLET | Freq: Once | ORAL | Status: AC
  Administered 2024-09-11: 20:00:00 40 mg via ORAL
  Filled 2024-09-11: qty 2

## 2024-09-11 NOTE — Discharge Instructions (Addendum)
 The x-ray of your left hand shows diffuse arthritic changes.  You may have an arthritis flare.  It is also possible that you have a flareup of gout.  You are being treated for both with the below medications:  You have been started on Naproxen  (Aleve ) here in the ER. Please continue to take 500mg  naproxen  (Aleve ) every 12 hours as needed for pain. Typically, pain should start to resolve within about 7-10 days. Discontinue taking this medication when your pain has resolved.  You have been prescribed prednisone . Please take this medication as prescribed for the next 7 days (40mg  on days 1 and 2, 30mg  on days 3 and 4, 20mg  on days 5 and 6, 10mg  on day 7).  You were given your first dose here today.  Take your next dose tomorrow morning.  Take this medication in the morning, as taking it at night may make it hard to sleep. If you are a diabetic, please monitor your blood sugars closely on this medication, as it can cause your blood sugar to rise.   You may take up to 1000mg  of tylenol  every 6 hours as needed for pain.  Do not take more then 4g per day.  You were given a dose with EMS.  Your next dose can be no sooner than 10 PM tonight.  Please schedule follow-up appointment with your PCP within the next 3 days to recheck your hand.  Return to the ER if your symptoms do not start to improve within the next 48 hours, you develop worsening pain, swelling, redness in your hand, any other new or concerning symptoms

## 2024-09-11 NOTE — ED Triage Notes (Signed)
 Pt arrived via [facility] from home c/o L hand and wrist pain x2 days, denies trauma/injury.  650mg  Tylenol  @ 1614  EMS VS BP 170/80 HR 98 RR 16 SpO2 98% RA

## 2024-09-11 NOTE — ED Provider Notes (Signed)
 [name] [facility] AT [facility] Provider Note   CSN: [id number] Arrival date & time: 09/11/24  8365     Patient presents with: Wrist Pain   Amber Hicks is a 73 y.o. female with history of hyperlipidemia, presents with concern for left hand pain that started 2 days ago.  She denies any known injuries to her left hand.  Reports pain mostly on the lateral aspect of her hand, but is having diffuse pain along her metacarpals.  She also reports some increased swelling to the hand.  Denies any numbness or tingling in her left hand.  She is not taking anything for the pain today aside from Tylenol  given by EMS.    Wrist Pain       Prior to Admission medications  Medication Sig Start Date End Date Taking? Authorizing Provider  naproxen  (NAPROSYN ) 500 MG tablet Take 1 tablet (500 mg total) by mouth every 12 (twelve) hours as needed for moderate pain (pain score 4-6) or mild pain (pain score 1-3). 09/11/24  Yes Veta Palma, PA-C  predniSONE  (DELTASONE ) 10 MG tablet Take 4 tablets (40 mg total) by mouth daily with breakfast for 1 day, THEN 3 tablets (30 mg total) daily for 2 days, THEN 2 tablets (20 mg total) daily for 2 days, THEN 1 tablet (10 mg total) daily for 1 day. 09/12/24 09/18/24 Yes Veta Palma, PA-C  acetaminophen  (TYLENOL ) 325 MG tablet Take 650 mg by mouth every 6 (six) hours as needed for mild pain.    [provider]  amoxicillin -clavulanate (AUGMENTIN ) 875-125 MG tablet Take 1 tablet by mouth every 12 (twelve) hours. Patient not taking: Reported on 03/24/2024 03/26/22   Nivia Colon, PA-C  diclofenac  Sodium (VOLTAREN ) 1 % GEL Apply 2 g topically 4 (four) times daily. Patient not taking: Reported on 03/24/2024 07/19/21   Raenelle Donalda HERO, MD  lidocaine -prilocaine  (EMLA ) cream Apply 1 Application topically as needed. Apply to left buttock and cover with saran wrap 1 hour as needed for pain Patient not taking: Reported on 03/24/2024  03/26/22   Nivia Colon, PA-C  LORazepam  (ATIVAN ) 0.5 MG tablet 1-2 tabs 30 - 60 min prior to shot. Do not drive with this medicine. Patient not taking: Reported on 01/28/2024 09/08/21   Corey, Evan S, MD  Multiple Vitamin (MULTIVITAMIN ADULT PO) Multivitamin    [provider]  omeprazole (PRILOSEC) 40 MG capsule Take 40 mg by mouth daily.    [provider]  rosuvastatin  (CRESTOR ) 20 MG tablet Take 1 tablet (20 mg total) by mouth daily. 01/15/21   Norleen Lynwood ORN, MD  traMADol  (ULTRAM ) 50 MG tablet Take 1 tablet (50 mg total) by mouth every 6 (six) hours as needed for moderate pain. Patient not taking: Reported on 03/24/2024 10/09/21   Zackowski, Scott, MD  valACYclovir (VALTREX) 500 MG tablet Take 1,000 mg by mouth 3 (three) times daily. Patient not taking: Reported on 03/24/2024 11/05/22   [provider]    Allergies: Doxycycline     Review of Systems  Musculoskeletal:        Left hand pain    Updated Vital Signs BP (!) 143/79 (BP Location: Right Arm)   Pulse 93   Temp 97.8 F (36.6 C)   Resp 18   Ht 5' 7 (1.702 m)   Wt 71.7 kg   SpO2 97%   BMI 24.75 kg/m   Physical Exam Vitals and nursing note reviewed.  Constitutional:      Appearance: Normal appearance.  HENT:  Head: Atraumatic.  Cardiovascular:     Rate and Rhythm: Normal rate and regular rhythm.     Comments: Radial pulse 2+ bilaterally Brisk cap refill in the 1st through 5th digits of the left hand Pulmonary:     Effort: Pulmonary effort is normal.  Musculoskeletal:     Comments: Left upper extremity:  General Mild edema of the dorsum of the left hand.  No erythema.  No overlying wounds.  Palpation Tender to palpation of the 1st through 5th metacarpals diffusely.  Nontender over the 1st through 5th phalanges. Nontender of the carpal bones diffusely, no snuffbox TTP Tender over the distal radius.  Nontender over the distal ulna.  ROM Decreased flexion extension at the wrist due to  pain Full flexion extension at the 1st through 5th MCPs, PIPs, DIPs  Sensation: Sensation intact throughout the 1st-5th digits of the left hand    Neurological:     General: No focal deficit present.     Mental Status: She is alert.  Psychiatric:        Mood and Affect: Mood normal.        Behavior: Behavior normal.     (all labs ordered are listed, but only abnormal results are displayed) Labs Reviewed - No data to display  EKG: None  Radiology: DG Wrist Complete Left Result Date: 09/11/2024 EXAM: 3 OR MORE VIEW(S) XRAY OF THE LEFT WRIST 09/11/2024 05:18:00 PM COMPARISON: None available. CLINICAL HISTORY: Wrist pain FINDINGS: BONES AND JOINTS: No acute fracture. No malalignment. Moderate osteoarthritis of first carpometacarpal joint. Mild diffuse interphalangeal joint space narrowing. SOFT TISSUES: Mild diffuse soft tissue swelling. IMPRESSION: 1. No acute findings. 2. Mild diffuse soft tissue swelling. 3. 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 Electronically signed by: Morene Hoard MD 09/11/2024 06:19 PM EST RP Workstation: [id number]     Procedures   Medications Ordered in the ED  naproxen  (NAPROSYN ) tablet 500 mg (500 mg Oral Given 09/11/24 1937)  predniSONE  (DELTASONE ) tablet 40 mg (40 mg Oral Given 09/11/24 1937)                                    Medical Decision Making Amount and/or Complexity of Data Reviewed Radiology: ordered.  Risk Prescription drug management.     Differential diagnosis includes but is not limited to fracture, dislocation, osteoarthritis flare, cellulitis, septic arthritis, gout  ED Course:  Upon initial evaluation, patient is well-appearing, no acute distress.  Normal vital signs aside from her elevated blood pressure upon arrival at 143/79.  Reporting left hand pain.  On exam, there is mild edema noted over the dorsum of the patient's left hand  diffusely.  She is diffusely tender over the 1st through 5th MCPs of the left hand.  Nontender over the 1st through 5th phalanges on the left hand.  Mild tenderness over the distal radius, but no tenderness along the ulna.  She denies any known injury, no point tenderness to palpation, and x-ray imaging without any evidence of fracture or dislocation.  She does have diffuse arthritic changes throughout her hand noted.  She does not have any erythema, still has intact range of motion of the left wrist and left 1st through 5th digits, no overlying wounds, no concern for cellulitis or septic arthritis. She is neurovascularly intact in the left hand   Given the sudden onset without known injury, and pain mostly  in MCP joints, question arthritis flare versus gout.  She does not have any history of gout.  I did review patient's most recent labs from December 2024 which showed normal creatinine and blood glucose.  Will treat with course of naproxen , prednisone , Tylenol  for possible arthritis flare vs gout.   Patient stable and appropriate for discharge home   Imaging Studies ordered: I ordered imaging studies including x-ray left wrist I independently visualized the imaging with scope of interpretation limited to determining acute life threatening conditions related to emergency care. Imaging showed  IMPRESSION:  1. No acute findings.  2. Mild diffuse soft tissue swelling.  3. 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  Take course of prednisone  as prescribed.  Follow-up with PCP within the next 3 days for recheck of her left hand. Return precautions  given and patient verbalized understanding.    Record Review: External records from outside source obtained and reviewed including lab records     This chart was dictated using voice recognition software, Dragon. Despite the best efforts of this provider to proofread and correct errors, errors may still occur which can change documentation meaning.       Final diagnoses:  Arthritis of left hand    ED Discharge Orders          Ordered    predniSONE  (DELTASONE ) 10 MG tablet  Multiple Frequencies        09/11/24 1930    naproxen  (NAPROSYN ) 500 MG tablet  Every 12 hours PRN        09/11/24 1930               Veta Palma, PA-C 09/11/24 1954    Yolande Lamar BROCKS, MD 09/12/24 1535

## 2024-09-11 NOTE — ED Notes (Signed)
 Pt awaiting ride in lobby.

## 2024-09-19 NOTE — ED Notes (Signed)
 Patient call stating she has went by the pharmacy 3 times looking for her prescription.  I called the pharmacy and they have 2 prescriptions waiting on her.  She advised she went to the [facility] on [address]--I advised the prescriptions where called in at the [facility] on [address]--transferred call to registration to change her address
